# Patient Record
Sex: Male | Born: 1955 | Race: Black or African American | Hispanic: No | Marital: Married | Smoking: Former smoker
Health system: Southern US, Community
[De-identification: ages and names within clinical notes are randomized; demographics above are authoritative.]

## PROBLEM LIST (undated history)

## (undated) DIAGNOSIS — R011 Cardiac murmur, unspecified: Secondary | ICD-10-CM

## (undated) DIAGNOSIS — I209 Angina pectoris, unspecified: Secondary | ICD-10-CM

## (undated) DIAGNOSIS — I10 Essential (primary) hypertension: Secondary | ICD-10-CM

## (undated) DIAGNOSIS — L409 Psoriasis, unspecified: Secondary | ICD-10-CM

## (undated) DIAGNOSIS — I639 Cerebral infarction, unspecified: Secondary | ICD-10-CM

## (undated) HISTORY — PX: NO PAST SURGERIES: SHX2092

---

## 2013-02-01 DIAGNOSIS — I639 Cerebral infarction, unspecified: Secondary | ICD-10-CM

## 2013-02-01 HISTORY — DX: Cerebral infarction, unspecified: I63.9

## 2013-02-03 ENCOUNTER — Inpatient Hospital Stay (HOSPITAL_COMMUNITY): Payer: 59

## 2013-02-03 ENCOUNTER — Encounter (HOSPITAL_COMMUNITY): Payer: Self-pay | Admitting: *Deleted

## 2013-02-03 ENCOUNTER — Emergency Department (HOSPITAL_COMMUNITY): Payer: 59

## 2013-02-03 ENCOUNTER — Inpatient Hospital Stay (HOSPITAL_COMMUNITY)
Admission: EM | Admit: 2013-02-03 | Discharge: 2013-02-09 | DRG: 065 | Disposition: A | Discharge status: Rehabilitation Facility | Payer: 59 | Attending: Internal Medicine | Admitting: Internal Medicine

## 2013-02-03 ENCOUNTER — Other Ambulatory Visit: Payer: Self-pay

## 2013-02-03 DIAGNOSIS — I517 Cardiomegaly: Secondary | ICD-10-CM

## 2013-02-03 DIAGNOSIS — R29810 Facial weakness: Secondary | ICD-10-CM | POA: Diagnosis present

## 2013-02-03 DIAGNOSIS — I6350 Cerebral infarction due to unspecified occlusion or stenosis of unspecified cerebral artery: Secondary | ICD-10-CM

## 2013-02-03 DIAGNOSIS — L408 Other psoriasis: Secondary | ICD-10-CM | POA: Diagnosis present

## 2013-02-03 DIAGNOSIS — Z79899 Other long term (current) drug therapy: Secondary | ICD-10-CM

## 2013-02-03 DIAGNOSIS — I639 Cerebral infarction, unspecified: Secondary | ICD-10-CM

## 2013-02-03 DIAGNOSIS — G8100 Flaccid hemiplegia affecting unspecified side: Secondary | ICD-10-CM | POA: Diagnosis present

## 2013-02-03 DIAGNOSIS — F1010 Alcohol abuse, uncomplicated: Secondary | ICD-10-CM | POA: Diagnosis present

## 2013-02-03 DIAGNOSIS — R4701 Aphasia: Secondary | ICD-10-CM | POA: Diagnosis present

## 2013-02-03 DIAGNOSIS — I6340 Cerebral infarction due to embolism of unspecified cerebral artery: Secondary | ICD-10-CM

## 2013-02-03 DIAGNOSIS — I10 Essential (primary) hypertension: Secondary | ICD-10-CM | POA: Diagnosis present

## 2013-02-03 DIAGNOSIS — Z8673 Personal history of transient ischemic attack (TIA), and cerebral infarction without residual deficits: Secondary | ICD-10-CM | POA: Diagnosis present

## 2013-02-03 DIAGNOSIS — Z720 Tobacco use: Secondary | ICD-10-CM

## 2013-02-03 DIAGNOSIS — I4891 Unspecified atrial fibrillation: Secondary | ICD-10-CM | POA: Diagnosis present

## 2013-02-03 DIAGNOSIS — E785 Hyperlipidemia, unspecified: Secondary | ICD-10-CM | POA: Diagnosis present

## 2013-02-03 DIAGNOSIS — F17200 Nicotine dependence, unspecified, uncomplicated: Secondary | ICD-10-CM | POA: Diagnosis present

## 2013-02-03 HISTORY — DX: Cerebral infarction, unspecified: I63.9

## 2013-02-03 HISTORY — DX: Psoriasis, unspecified: L40.9

## 2013-02-03 HISTORY — DX: Cardiac murmur, unspecified: R01.1

## 2013-02-03 HISTORY — DX: Angina pectoris, unspecified: I20.9

## 2013-02-03 HISTORY — DX: Essential (primary) hypertension: I10

## 2013-02-03 LAB — BASIC METABOLIC PANEL
GFR calc Af Amer: 79 mL/min — ABNORMAL LOW (ref >90)
GFR calc non Af Amer: 69 mL/min — ABNORMAL LOW (ref >90)
Potassium: 4.1 mEq/L (ref 3.5–5.1)
Sodium: 139 mEq/L (ref 135–145)

## 2013-02-03 LAB — CBC WITH DIFFERENTIAL/PLATELET
Basophils Relative: 0 % (ref 0–1)
Eosinophils Absolute: 0.1 10*3/uL (ref 0.0–0.7)
Lymphs Abs: 2.1 10*3/uL (ref 0.7–4.0)
MCH: 29.1 pg (ref 26.0–34.0)
MCHC: 34.8 g/dL (ref 30.0–36.0)
Neutrophils Relative %: 64 % (ref 43–77)
Platelets: 259 10*3/uL (ref 150–400)
RBC: 5.91 MIL/uL — ABNORMAL HIGH (ref 4.22–5.81)

## 2013-02-03 MED ORDER — SENNOSIDES-DOCUSATE SODIUM 8.6-50 MG PO TABS: 1.0000 | ORAL_TABLET | Freq: Every evening | ORAL | Status: DC | PRN

## 2013-02-03 MED ORDER — ASPIRIN 325 MG PO TABS
325.0000 mg | ORAL_TABLET | Freq: Every day | ORAL | Status: DC
  Administered 2013-02-04 – 2013-02-09 (×6): 325 mg via ORAL
  Filled 2013-02-03 (×6): qty 1

## 2013-02-03 MED ORDER — THIAMINE HCL 100 MG/ML IJ SOLN
100.0000 mg | Freq: Every day | INTRAMUSCULAR | Status: DC
  Administered 2013-02-03 – 2013-02-04 (×2): 100 mg via INTRAVENOUS
  Filled 2013-02-03: qty 2
  Filled 2013-02-03: qty 1

## 2013-02-03 MED ORDER — ENOXAPARIN SODIUM 40 MG/0.4ML ~~LOC~~ SOLN
40.0000 mg | SUBCUTANEOUS | Status: DC
  Administered 2013-02-03 – 2013-02-08 (×6): 40 mg via SUBCUTANEOUS
  Filled 2013-02-03 (×7): qty 0.4

## 2013-02-03 MED ORDER — SODIUM CHLORIDE 0.9 % IV SOLN
INTRAVENOUS | Status: AC
  Administered 2013-02-03: 20 mL/h via INTRAVENOUS

## 2013-02-03 MED ORDER — PNEUMOCOCCAL VAC POLYVALENT 25 MCG/0.5ML IJ INJ
0.5000 mL | INJECTION | INTRAMUSCULAR | Status: AC
  Administered 2013-02-04: 0.5 mL via INTRAMUSCULAR
  Filled 2013-02-03: qty 0.5

## 2013-02-03 MED ORDER — ASPIRIN 325 MG PO TABS
325.0000 mg | ORAL_TABLET | Freq: Once | ORAL | Status: DC
  Administered 2013-02-03: 325 mg via ORAL
  Filled 2013-02-03: qty 1

## 2013-02-03 MED ORDER — NICOTINE 14 MG/24HR TD PT24
14.0000 mg | MEDICATED_PATCH | TRANSDERMAL | Status: DC
  Administered 2013-02-03 – 2013-02-08 (×6): 14 mg via TRANSDERMAL
  Filled 2013-02-03 (×7): qty 1

## 2013-02-03 NOTE — Progress Notes (Signed)
EEG completed.

## 2013-02-03 NOTE — H&P (Addendum)
[facility] History and Physical  Kale Rondeau EAV:[id number] DOB: 1956-06-24 DOA: 02/03/2013  Referring physician: EDP PCP: Pcp Not In System    Chief Complaint: R sided weakness  HPI: [name] is a 57 y.o. male with h/o HTN, tobacco use present to the ER today with the above complaint, he initially noticed the R arm and leg weakness on Monday night along with difficulty finding words. Per Pt he just didn't feel like coming to the ER when his symptoms started,  this morning he could not get out of bed and hence called EMS and was brought to the ER. Upon evaluation in ER, CT head revealed subacute left anterior cerebral artery infarct    Review of Systems:  The patient denies anorexia, fever, weight loss,, vision loss, decreased hearing, hoarseness, chest pain, syncope, dyspnea on exertion, peripheral edema, balance deficits, hemoptysis, abdominal pain, melena, hematochezia, severe indigestion/heartburn, hematuria, incontinence, genital sores, muscle weakness, suspicious skin lesions, transient blindness, difficulty walking, depression, unusual weight change, abnormal bleeding, enlarged lymph nodes, angioedema, and breast masses.    Past Medical History  Diagnosis Date  . Hypertension    No past surgical history on file. Social History:  reports that he has been smoking.  He does not have any smokeless tobacco history on file. He reports that  drinks alcohol. He reports that he does not use illicit drugs. Lives at home  Not on File  Family history Discussed with pt, he does not know his family history  Prior to Admission medications   Medication Sig Start Date End Date Taking? Authorizing Provider  clobetasol cream (TEMOVATE) 0.05 % Apply 1 application topically daily as needed (psoriasis).   Yes Historical Provider, MD  desonide (DESOWEN) 0.05 % cream Apply 1 application topically daily as needed (psoriasis).   Yes Historical Provider, MD  hydrochlorothiazide  (HYDRODIURIL) 25 MG tablet Take 25 mg by mouth daily.   Yes Historical Provider, MD  hydrOXYzine (ATARAX/VISTARIL) 10 MG tablet Take 10 mg by mouth daily.   Yes Historical Provider, MD   Physical Exam: Filed Vitals:   02/03/13 1250 02/03/13 1300 02/03/13 1330 02/03/13 1400  BP:  148/83 150/84 111/97  Pulse:  69 73 71  Temp: 98.4 F (36.9 C)     TempSrc:      Resp:  14 16 14   SpO2:  99% 99% 99%   Gen: AAOx3, no distress, very irritable HEENT: PERRLA, EOMI CVS: S1S2/RRR Lungs: CTAB Abd: soft, Nt, BS present, no palpable organomegaly Ext: no edema c/c Neuro:  Deviated angle of mouth Motor: RLE 0/5, RUE 3/5, LLE and LUE normal Sensory: light touch intact B/l Plantars downgoing DTR 2-3Plus b/l Coordination: finger nose intact on L and relatively on R after accounting for weakness    Labs on Admission:  Basic Metabolic Panel:  Recent Labs Lab 02/03/13 1323  NA 139  K 4.1  CL 102  CO2 22  GLUCOSE 107*  BUN 18  CREATININE 1.17  CALCIUM 9.8   Liver Function Tests: No results found for this basename: AST, ALT, ALKPHOS, BILITOT, PROT, ALBUMIN,  in the last 168 hours No results found for this basename: LIPASE, AMYLASE,  in the last 168 hours No results found for this basename: AMMONIA,  in the last 168 hours CBC:  Recent Labs Lab 02/03/13 1323  WBC 8.3  NEUTROABS 5.4  HGB 17.2*  HCT 49.4  MCV 83.6  PLT 259   Cardiac Enzymes: No results found for this basename: CKTOTAL, CKMB, CKMBINDEX, TROPONINI,  in the last 168 hours  BNP (last 3 results) No results found for this basename: PROBNP,  in the last 8760 hours CBG: No results found for this basename: GLUCAP,  in the last 168 hours  Radiological Exams on Admission: Ct Head Wo Contrast  02/03/2013   *RADIOLOGY REPORT*  Clinical Data: Acute stroke.  Occipital headache.  CT HEAD WITHOUT CONTRAST  Technique:  Contiguous axial images were obtained from the base of the skull through the vertex without contrast.   Comparison: None.  Findings: There is a subacute nonhemorrhagic left anterior cerebral artery distribution infarct with secondary edema without midline shift and only minimal mass  effect upon the anterior aspect of the interhemispheric falx.  Ventricles are not compressed.  The brain parenchyma is otherwise normal.  No osseous abnormality.  IMPRESSION: Non-hemorrhagic subacute left anterior cerebral artery infarct.   Original Report Authenticated By: Francene Boyers, M.D.    EKG: Independently reviewed. NSR, no acute ST-T wave changes  Assessment/Plan Active Problems:   CVA (cerebral infarction)   HTN (hypertension)  1. CVA-L ACA infarct -admit to Tele -NEurochecks -MRI/MRA Brain -ECHO/carotis duplex -Lipids, HBaic -PT/OT/ST eval -ASA 325mg  daily  2. HTN: -hold anti-hypertensive's to allow for permissible HTN  3. Tobacco use: Counseled  4. ETOH use -thiamine, MVI -monitor for withdrawal  DVT proph: lovenox   Code Status: Family Communication:  Disposition Plan: inpatient  Time spent:62min  [name] [facility] Pager [phone]  If 7PM-7AM, please contact night-coverage [email or web address] Password [email or web address] 02/03/2013, 4:19 PM

## 2013-02-03 NOTE — ED Notes (Signed)
Per report from [name] was at home this am he reportedly fell out of bed (denies neck or back pain).  On further evaluation [name] reports having a onset of R sided weakness onset 2 days ago noticed by his wife around 2200.  [name] has dysphagia, R sided weakness with drift and weak grip on R side, R leg weakness noted as well.

## 2013-02-03 NOTE — Consult Note (Signed)
Referring Physician: Rhunette Croft    Chief Complaint: right sided weakness and expressive aphasia  HPI:                                                                                                                                         [name] is an 57 y.o. male who noted on Monday night he had a sudden onset of right arm and leg weakness (leg >arm) along with difficulty expressing himself. He did not seek medical attention at that time. Patient was brought to ED today due to continued symptoms and now unable to get out of bed. Patient states he takes no medications but does smoke 5 cigarettes/day.   Date last known well: 5.19.14 Time last known well: 2100 tPA Given: No: out of window  Past Medical History  Diagnosis Date  . Hypertension     No past surgical history on file.  No family history on file. Social History:  reports that he has been smoking.  He does not have any smokeless tobacco history on file. He reports that  drinks alcohol. He reports that he does not use illicit drugs.  Allergies: Not on File  Medications:                                                                                                                           No current facility-administered medications for this encounter.   Current Outpatient Prescriptions  Medication Sig Dispense Refill  . clobetasol cream (TEMOVATE) 0.05 % Apply 1 application topically daily as needed (psoriasis).      Marland Kitchen desonide (DESOWEN) 0.05 % cream Apply 1 application topically daily as needed (psoriasis).      . hydrochlorothiazide (HYDRODIURIL) 25 MG tablet Take 25 mg by mouth daily.      . hydrOXYzine (ATARAX/VISTARIL) 10 MG tablet Take 10 mg by mouth daily.         ROS:  History obtained from the patient  General ROS: negative for - chills, fatigue, fever, night sweats, weight  gain or weight loss Psychological ROS: negative for - behavioral disorder, hallucinations, memory difficulties, mood swings or suicidal ideation Ophthalmic ROS: negative for - blurry vision, double vision, eye pain or loss of vision ENT ROS: negative for - epistaxis, nasal discharge, oral lesions, sore throat, tinnitus or vertigo Allergy and Immunology ROS: negative for - hives or itchy/watery eyes Hematological and Lymphatic ROS: negative for - bleeding problems, bruising or swollen lymph nodes Endocrine ROS: negative for - galactorrhea, hair pattern changes, polydipsia/polyuria or temperature intolerance Respiratory ROS: negative for - cough, hemoptysis, shortness of breath or wheezing Cardiovascular ROS: negative for - chest pain, dyspnea on exertion, edema or irregular heartbeat Gastrointestinal ROS: negative for - abdominal pain, diarrhea, hematemesis, nausea/vomiting or stool incontinence Genito-Urinary ROS: negative for - dysuria, hematuria, incontinence or urinary frequency/urgency Musculoskeletal ROS: negative for - joint swelling or muscular weakness Neurological ROS: as noted in HPI Dermatological ROS: negative for rash and skin lesion changes  Neurologic Examination:                                                                                                      Blood pressure 150/84, pulse 73, temperature 98.4 F (36.9 C), temperature source Oral, resp. rate 16, SpO2 99.00%.   Mental Status: Alert, oriented, thought content appropriate.  Speech dysarthric with evidence of expressive aphasia.  Able to follow 3 step commands without difficulty. Cranial Nerves: II: Discs flat bilaterally; Visual fields grossly normal, pupils equal, round, reactive to light and accommodation III,IV, VI: ptosis on right present, extra-ocular motions intact bilaterally V,VII: Face asymmetric on the right, facial light touch sensation normal bilaterally VIII: hearing normal bilaterally IX,X: gag  reflex present XI: bilateral shoulder shrug XII: midline tongue extension Motor: Right : Upper extremity   See note    Left:     Upper extremity   5/5  Lower extremity   0/5      Lower extremity   5/5 --unable to abduct right arm, no tricep strength, 4/5 bicep, weak grip and increased tone on right arm --right leg has increased tone and no movement  Sensory: Pinprick and light touch intact throughout, bilaterally but feel his right leg is decreased Deep Tendon Reflexes: 2+ left bicep and tricep, 3+right bicep, tricep and brachioradialis, 2+ right KJ and AJ, 1+ left KJ and no AJ and symmetric throughout Plantars: Right: downgoing   Left: downgoing Cerebellar: normal finger-to-nose on left,  normal heel-to-shin test on left CV: pulses palpable throughout    Results for orders placed during the hospital encounter of 02/03/13 (from the past 48 hour(s))  CBC WITH DIFFERENTIAL     Status: Abnormal   Collection Time    02/03/13  1:23 PM      Result Value Range   WBC 8.3  4.0 - 10.5 K/uL   RBC 5.91 (*) 4.22 - 5.81 MIL/uL   Hemoglobin 17.2 (*) 13.0 - 17.0 g/dL   HCT 40.9  81.1 - 91.4 %   MCV  83.6  78.0 - 100.0 fL   MCH 29.1  26.0 - 34.0 pg   MCHC 34.8  30.0 - 36.0 g/dL   RDW 16.1  09.6 - 04.5 %   Platelets 259  150 - 400 K/uL   Neutrophils Relative % 64  43 - 77 %   Neutro Abs 5.4  1.7 - 7.7 K/uL   Lymphocytes Relative 26  12 - 46 %   Lymphs Abs 2.1  0.7 - 4.0 K/uL   Monocytes Relative 9  3 - 12 %   Monocytes Absolute 0.7  0.1 - 1.0 K/uL   Eosinophils Relative 1  0 - 5 %   Eosinophils Absolute 0.1  0.0 - 0.7 K/uL   Basophils Relative 0  0 - 1 %   Basophils Absolute 0.0  0.0 - 0.1 K/uL  BASIC METABOLIC PANEL     Status: Abnormal   Collection Time    02/03/13  1:23 PM      Result Value Range   Sodium 139  135 - 145 mEq/L   Potassium 4.1  3.5 - 5.1 mEq/L   Chloride 102  96 - 112 mEq/L   CO2 22  19 - 32 mEq/L   Glucose, Bld 107 (*) 70 - 99 mg/dL   BUN 18  6 - 23 mg/dL    Creatinine, Ser 4.09  0.50 - 1.35 mg/dL   Calcium 9.8  8.4 - 81.1 mg/dL   GFR calc non Af Amer 69 (*) >90 mL/min   GFR calc Af Amer 79 (*) >90 mL/min   Comment:            The eGFR has been calculated     using the CKD EPI equation.     This calculation has not been     validated in all clinical     situations.     eGFR's persistently     <90 mL/min signify     possible Chronic Kidney Disease.   Ct Head Wo Contrast  02/03/2013   *RADIOLOGY REPORT*  Clinical Data: Acute stroke.  Occipital headache.  CT HEAD WITHOUT CONTRAST  Technique:  Contiguous axial images were obtained from the base of the skull through the vertex without contrast.  Comparison: None.  Findings: There is a subacute nonhemorrhagic left anterior cerebral artery distribution infarct with secondary edema without midline shift and only minimal mass  effect upon the anterior aspect of the interhemispheric falx.  Ventricles are not compressed.  The brain parenchyma is otherwise normal.  No osseous abnormality.  IMPRESSION: Non-hemorrhagic subacute left anterior cerebral artery infarct.   Original Report Authenticated By: Francene Boyers, M.D.    Assessment and plan discussed with with attending physician and they are in agreement.    Felicie Morn PA-C [facility] [phone]  02/03/2013, 2:15 PM   Assessment: 57 y.o. male with left frontal CVA and symptoms of right arm and leg weakness and expressive aphasia. Patient takes no ASA at home and is not a tPA candidate due to LSN 5.19.14.    Stroke Risk Factors - hypertension and smoking  Plan: 1. HgbA1c, fasting lipid panel 2. MRI, MRA  of the brain without contrast 3. PT consult, OT consult, Speech consult 4. Echocardiogram 5. Carotid dopplers 6. Prophylactic therapy-Antiplatelet med: Aspirin - dose 81 mg daily after passes swallow screen 7. Risk factor modification 8. Telemetry monitoring 9. Frequent neuro checks    Patient seen and examined together with  physician assistant and I concur with the assessment  and plan.  Wyatt Portela, MD

## 2013-02-03 NOTE — Progress Notes (Signed)
*  PRELIMINARY RESULTS* Vascular Ultrasound Carotid Duplex (Doppler) has been completed.   There is no obvious evidence of hemodynamically significant internal carotid artery stenosis >40%. Vertebral arteries are patent with antegrade flow.  02/03/2013 4:28 PM Gertie Fey, RDMS, RDCS

## 2013-02-03 NOTE — Progress Notes (Signed)
*  PRELIMINARY RESULTS* Echocardiogram 2D Echocardiogram has been performed.  [name] 02/03/2013, 4:11 PM

## 2013-02-03 NOTE — ED Provider Notes (Signed)
History     CSN: [id number]  Arrival date & time 02/03/13  1116   First MD Initiated Contact with Patient 02/03/13 1131      Chief Complaint  Patient presents with  . Cerebrovascular Accident    R sided weakness onset 02/01/2013 2200    (Consider location/radiation/quality/duration/timing/severity/associated sxs/prior treatment) HPI Angeles Paolucci is a 57 y.o. male current everyday smoker with a history of hypertension presents to the emergency department complaining of right-sided weakness.  Onset of symptoms began Monday and were described as right upper and right lower extremity numbness/weakness.  Patient arrived to the emergency department today because this morning he was unable to get out of bed due to the inability to use his limbs.  Wife reports noticing dysarthria/dysphonia since yesterday with facial droop.  Patient denies any head or neck trauma, history of CVA, difficulty swallowing liquids or solids, dizziness, change in vision, fevers, night sweats or chills.    Past Medical History  Diagnosis Date  . Hypertension     No past surgical history on file.  No family history on file.  History  Substance Use Topics  . Smoking status: Current Every Day Smoker -- 0.50 packs/day for 25 years  . Smokeless tobacco: Not on file  . Alcohol Use: Yes      Review of Systems Ten systems reviewed and are negative for acute change, except as noted in the HPI.    Allergies  Review of patient's allergies indicates not on file.  Home Medications   Current Outpatient Rx  Name  Route  Sig  Dispense  Refill  . clobetasol cream (TEMOVATE) 0.05 %   Topical   Apply 1 application topically daily as needed (psoriasis).         Marland Kitchen desonide (DESOWEN) 0.05 % cream   Topical   Apply 1 application topically daily as needed (psoriasis).         . hydrochlorothiazide (HYDRODIURIL) 25 MG tablet   Oral   Take 25 mg by mouth daily.         . hydrOXYzine (ATARAX/VISTARIL) 10 MG  tablet   Oral   Take 10 mg by mouth daily.           BP 150/84  Pulse 73  Temp(Src) 98.4 F (36.9 C) (Oral)  Resp 16  SpO2 99%  Physical Exam  Nursing note and vitals reviewed. Constitutional: He is oriented to person, place, and time. He appears well-developed and well-nourished. No distress.  HENT:  Head: Normocephalic and atraumatic.  Eyes: Conjunctivae and EOM are normal.  Neck: Normal range of motion.  Pulmonary/Chest: Effort normal.  Musculoskeletal: Normal range of motion.  Neurological: He is alert and oriented to person, place, and time.  Decreased sensation along the trigeminal nerve left side.  Positive facial droop and dysphasia.  Right-sided weakness  in grip strength, upper extremity, lower extremity and shoulder shrug. Eye movement, pupillary response & vision intact.    Skin: Skin is warm and dry. No rash noted. He is not diaphoretic.  Psychiatric: He has a normal mood and affect. His behavior is normal.    ED Course  Procedures (including critical care time)  Labs Reviewed  CBC WITH DIFFERENTIAL - Abnormal; Notable for the following:    RBC 5.91 (*)    Hemoglobin 17.2 (*)    All other components within normal limits  BASIC METABOLIC PANEL - Abnormal; Notable for the following:    Glucose, Bld 107 (*)    GFR calc non  Af Amer 69 (*)    GFR calc Af Amer 79 (*)    All other components within normal limits  URINALYSIS, ROUTINE W REFLEX MICROSCOPIC   Ct Head Wo Contrast  02/03/2013   *RADIOLOGY REPORT*  Clinical Data: Acute stroke.  Occipital headache.  CT HEAD WITHOUT CONTRAST  Technique:  Contiguous axial images were obtained from the base of the skull through the vertex without contrast.  Comparison: None.  Findings: There is a subacute nonhemorrhagic left anterior cerebral artery distribution infarct with secondary edema without midline shift and only minimal mass  effect upon the anterior aspect of the interhemispheric falx.  Ventricles are not  compressed.  The brain parenchyma is otherwise normal.  No osseous abnormality.  IMPRESSION: Non-hemorrhagic subacute left anterior cerebral artery infarct.   Original Report Authenticated By: Francene Boyers, M.D.   Consult neurology, to see in ED, MRI/A brain added.   No diagnosis found.    MDM  57 year old male to ER with right-sided upper and lower extremity weakness, lasts normally seen on Monday presented to the emergency department and found to have had subacute left anterior cerebral artery infarct. Pt passed swallow screen, ASA ordered. Consult to neurology placed. The patient appears reasonably stabilized for admission considering the current resources, flow, and capabilities available in the ED at this time, and I doubt any other [facility] requiring further screening and/or treatment in the ED prior to admission.         Jaci Carrel, [place] 02/03/13 1440   Medical screening examination/treatment/procedure(s) were conducted as a shared visit with non-physician practitioner(s) and myself.  I personally evaluated the patient during the encounter  Derwood Kaplan, MD 02/03/13 1646

## 2013-02-03 NOTE — ED Notes (Signed)
Patient transported to CT 

## 2013-02-04 LAB — URINALYSIS, ROUTINE W REFLEX MICROSCOPIC
Nitrite: NEGATIVE
Specific Gravity, Urine: 1.036 — ABNORMAL HIGH (ref 1.005–1.030)
Urobilinogen, UA: 0.2 mg/dL (ref 0.0–1.0)

## 2013-02-04 LAB — LIPID PANEL
LDL Cholesterol: 127 mg/dL — ABNORMAL HIGH (ref 0–99)
VLDL: 38 mg/dL (ref 0–40)

## 2013-02-04 LAB — HEMOGLOBIN A1C: Hgb A1c MFr Bld: 5.7 % — ABNORMAL HIGH (ref <5.7)

## 2013-02-04 LAB — ANTITHROMBIN III: AntiThromb III Func: 116 % (ref 75–120)

## 2013-02-04 LAB — RAPID URINE DRUG SCREEN, HOSP PERFORMED
Amphetamines: NOT DETECTED
Benzodiazepines: NOT DETECTED
Opiates: NOT DETECTED

## 2013-02-04 MED ORDER — VITAMIN B-1 100 MG PO TABS
100.0000 mg | ORAL_TABLET | Freq: Every day | ORAL | Status: DC
  Administered 2013-02-05 – 2013-02-09 (×5): 100 mg via ORAL
  Filled 2013-02-04 (×5): qty 1

## 2013-02-04 NOTE — Evaluation (Signed)
Speech Language Pathology Evaluation Patient Details Name: [name] MRN: [id number] DOB: 19-Aug-1956 Today's Date: 02/04/2013 Time: 4782-9562 SLP Time Calculation (min): 25 min  Problem List:  Patient Active Problem List   Diagnosis Date Noted  . CVA (cerebral infarction) 02/03/2013  . HTN (hypertension) 02/03/2013  . Tobacco abuse 02/03/2013   Past Medical History:  Past Medical History  Diagnosis Date  . Hypertension   . Anginal pain   . Heart murmur   . Stroke 02/01/2013    left frontal CVA and symptoms of right arm and leg weakness and expressive aphasia/notes 02/03/2013 (02/03/2013)  . Psoriasis     "back and stomach" (02/03/2013)   Past Surgical History:  Past Surgical History  Procedure Laterality Date  . No past surgeries     HPI:  57 yo male adm to [facility] with right sided arm/leg weakness and word finding deficits initiating Mon night.  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  Order for speech/language evaluation received.    Assessment / Plan / Recommendation Clinical Impression  Pt presents with moderate expressive more than receptive language difficulties resulting in pt responding in short phrase level to SLP questions.    Pt able to state his address, family situation but appears with some word finding difficulties evidenced by pauses during expression.  Responsive naming tasks completed 100%.    Two step directions followed 50% accurately, ? some limb apraxia.  Flat affect apparent but uncertain of pt's baseline.  Mild dysarthria noted with imprecise articulation impacting speech intelligibility.  Pt also admits to memory changes with this event and he did not recall medical diagnosis.    Skilled SLP intervention recommended to maximize pt's cognitive linguistic function as he states he manages all cooking, cleaning, bill paying at home as his wife works full time.  Skilled SLP intervention included educating pt to results of testing, setting goals, and  providing verbal compensation to pt.  Pt agreeable to SLP intervention.      SLP Assessment  Patient needs continued Speech Lanaguage Pathology Services    Follow Up Recommendations   (TBD)    Frequency and Duration min 2x/week  2 weeks   Pertinent Vitals/Pain n/a   SLP Goals  SLP Goals Potential to Achieve Goals: Fair Potential Considerations: Cooperation/participation level (uncertain of prior level of function) SLP Goal #1: Pt will demonstrate ability to manage financial and home management tasks with moderate cues and 80% accuracy.   SLP Goal #2: Pt willl use strategies to improve speech intelligibility to 80% at sentence level with moderate verbal cues.  SLP Goal #3: Pt will use word finding strategies during communication with moderate verbal cues and 80% accuracy.  SLP Goal #4: Pt will use memory strategies to compensates for deficits with moderate cues and 80% accuracy.   SLP Evaluation Prior Functioning  Type of Home: House Lives With: Spouse;Son Available Help at Discharge: Family (son does not work per pt and can assist) Education: HS graduate Vocation:  (pt does not work per his statement)   Cognition  Overall Cognitive Status: No family/caregiver present to determine baseline cognitive functioning (pt states WFL) Arousal/Alertness: Awake/alert Orientation Level: Oriented X4 Attention: Sustained Sustained Attention: Impaired Sustained Attention Impairment: Functional basic Memory: Impaired Memory Impairment:  (pt admits to memory changes, did not recall testing results) Problem Solving: Impaired Problem Solving Impairment: Functional basic    Comprehension  Auditory Comprehension Overall Auditory Comprehension: Impaired Commands: Impaired One Step Basic Commands: 75-100% accurate Two Step Basic Commands:  50-74% accurate Conversation: Complex Interfering Components: Attention Reading Comprehension Reading Status: Not tested    Expression  Expression Primary Mode of Expression: Verbal Verbal Expression Overall Verbal Expression: Impaired Initiation: No impairment Automatic Speech: Social Response Level of Generative/Spontaneous Verbalization: Phrase (answers questions in short phrases only, does not elaborate) Repetition: No impairment Naming: No impairment (for pictures of 7 items) Pragmatics: Impairment Impairments: Abnormal affect;Monotone Other Verbal Expression Comments: pt with difficulty pointing out and verbalizing significant elements in picture, states he wears glasses that are not present, expressive language limited to short phrases Written Expression Dominant Hand: Right Written Expression: Not tested   Oral / Motor Oral Motor/Sensory Function Overall Oral Motor/Sensory Function: Impaired Labial ROM: Reduced right Labial Strength: Reduced Labial Sensation: Reduced Lingual ROM: Within Functional Limits Lingual Symmetry: Within Functional Limits Lingual Strength: Reduced Lingual Sensation: Within Functional Limits Facial ROM: Reduced right Facial Strength: Reduced Facial Sensation: Reduced Velum: Within Functional Limits (delayed response) Motor Speech Phonation: Low vocal intensity (uncertain of baseline) Articulation: Impaired Level of Impairment: Phrase Intelligibility: Intelligibility reduced Phrase: 75-100% accurate Motor Planning: Witnin functional limits Motor Speech Errors: Not applicable Effective Techniques: Slow rate   GO     Donavan Burnet, MS [facility] SLP [phone]

## 2013-02-04 NOTE — Evaluation (Signed)
Occupational Therapy Evaluation Patient Details Name: Wendelin Bradt MRN: [id number] DOB: [birth year] Today's Date: 02/04/2013 Time: 4098-1191 OT Time Calculation (min): 29 min  OT Assessment / Plan / Recommendation Clinical Impression   This 57 y.o. Male admitted with Rt sided weakness.  MRI showed large left ACA infarct.  Pt presents to OT with Rt. 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  He will benefit from OT to maximize safety and independence  with BADLs to allow him to return to min A level.  Recommend CIR consult and 24 hour supervision/assist at discharge.     OT Assessment  Patient needs continued OT Services    Follow Up Recommendations  CIR;Supervision/Assistance - 24 hour    Barriers to Discharge Decreased caregiver support    Equipment Recommendations  None recommended by OT    Recommendations for Other Services Rehab consult  Frequency  Min 2X/week    Precautions / Restrictions Precautions Precautions: Fall Precaution Comments: Pt impulsive with poor awareness of deficits; Rt. neglect Restrictions Weight Bearing Restrictions: No   Pertinent Vitals/Pain     ADL  Eating/Feeding: Supervision/safety Where Assessed - Eating/Feeding: Bed level Grooming: Brushing hair;Supervision/safety Where Assessed - Grooming: Supine, head of bed up Upper Body Bathing: Moderate assistance Where Assessed - Upper Body Bathing: Supine, head of bed up;Supported sitting Lower Body Bathing: +2 Total assistance Lower Body Bathing: Patient Percentage: 40% Where Assessed - Lower Body Bathing: Supported sit to stand Upper Body Dressing: Maximal assistance Where Assessed - Upper Body Dressing: Supported sitting Lower Body Dressing: +2 Total assistance Lower Body Dressing: Patient Percentage: 30% Where Assessed - Lower Body Dressing: Supported sit to Pharmacist, hospital: +2 Total assistance Toilet Transfer:  Patient Percentage: 40% Toilet Transfer Method: Sit to stand;Stand pivot Acupuncturist: Comfort height toilet;Grab bars Toileting - Clothing Manipulation and Hygiene: +2 Total assistance Toileting - Clothing Manipulation and Hygiene: Patient Percentage: 40% Where Assessed - Toileting Clothing Manipulation and Hygiene: Standing Transfers/Ambulation Related to ADLs: Pt ambulated to bathroom with total A +2 (pt~40%).  Pt leaning heavily to Rt. side and difficulty advancing Rt. LE    OT Diagnosis: Generalized weakness;Cognitive deficits;Hemiplegia dominant side;Apraxia  OT Problem List: Decreased strength;Decreased range of motion;Decreased activity tolerance;Impaired balance (sitting and/or standing);Decreased coordination;Decreased cognition;Decreased safety awareness;Decreased knowledge of use of DME or AE;Impaired UE functional use;Impaired vision/perception OT Treatment Interventions: Self-care/ADL training;Neuromuscular education;DME and/or AE instruction;Therapeutic activities;Cognitive remediation/compensation;Visual/perceptual remediation/compensation;Patient/family education;Balance training   OT Goals Acute Rehab OT Goals OT Goal Formulation: With patient Time For Goal Achievement: 02/18/13 Potential to Achieve Goals: Good ADL Goals Pt Will Perform Grooming: with supervision;Sitting, chair ADL Goal: Grooming - Progress: Goal set today Pt Will Perform Upper Body Bathing: with min assist;Sitting, chair ADL Goal: Upper Body Bathing - Progress: Goal set today Pt Will Perform Lower Body Bathing: with mod assist;Sit to stand from chair;Sit to stand from bed ADL Goal: Lower Body Bathing - Progress: Goal set today Pt Will Perform Upper Body Dressing: with min assist;Sitting, chair ADL Goal: Upper Body Dressing - Progress: Goal set today Pt Will Perform Lower Body Dressing: with min assist;Sit to stand from chair;Sit to stand from bed ADL Goal: Lower Body Dressing - Progress:  Goal set today Pt Will Perform Toileting - Clothing Manipulation: with min assist;Standing ADL Goal: Toileting - Clothing Manipulation - Progress: Goal set today Additional ADL Goal #1: Pt will demonstrate intellectual awareness of deficits with min cues ADL Goal: Additional Goal #1 - Progress: Goal set today Additional  ADL Goal #2: Pt will require no more than min cues for safety awareness ADL Goal: Additional Goal #2 - Progress: Goal set today  Visit Information  Last OT Received On: 02/04/13 Assistance Needed: +2 PT/OT Co-Evaluation/Treatment: Yes    Subjective Data  Subjective: "It is my Right hand!"  Pt's response when he attempted to write name with pen in mouth and was instructed to place pen in Rt. hand. Patient Stated Goal: To walk out of here   Prior Functioning     Home Living Lives With: Spouse Available Help at Discharge: Family Type of Home: Mobile home Home Access: Stairs to enter Entrance Stairs-Number of Steps: 0 at front and 3 at back ?? Home Layout: One level Bathroom Shower/Tub: Engineer, manufacturing systems: Standard Home Adaptive Equipment: None Additional Comments: Pt did not mention living with son during OT/PT eval.  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  Pt unable to state if he stopped working due to disability or for other reason Communication Communication: No difficulties Dominant Hand: Right         Vision/Perception Vision - History Baseline Vision: No visual deficits Patient Visual Report: No change from baseline Vision - Assessment Eye Alignment: Within Functional Limits Vision Assessment: Vision tested Ocular Range of Motion: Within Functional Limits Tracking/Visual Pursuits: Able to track stimulus in  all quads without difficulty Visual Fields: No apparent deficits Additional Comments: Pt with decreased visual attention Perception Perception: Impaired Inattention/Neglect: Does not attend to right side of body Praxis Praxis: Impaired Praxis Impairment Details: Ideation (questionable) Praxis-Other Comments: Pt able to demonstrate use of brush correctly, and automatically brushed Rt. side of head with his Rt hand.  Pt was insistent that Rt UE was normal with no deficit so therapist asked pt to write name with his right hand.  Pt. proceeded to place pen in mouth and proceeded to atttempt to write with his mouth.  When cued to use Rt. hand, pt became irritable and insisted "it's in my rt. hand!" when indeed the pen was in his mouth.   Cognition  Cognition Arousal/Alertness: Awake/alert Behavior During Therapy: Flat affect Overall Cognitive Status: Impaired/Different from baseline Area of Impairment: Safety/judgement;Awareness Safety/Judgement: Decreased awareness of safety;Decreased awareness of deficits Awareness: Intellectual (with mod cues) General Comments: Pt is impulsive.  Initially he states he is in the hospital because he can't urinate.  Then he acknowleges he had a stroke and couldn't walk at home, but insists he is able to walk now and he is fine with no weakness.  Pt. unable to move Rt LE to command, is able to activate Rt. LE when ambulating.  Pt. will move Rt. UE to command, but not fully.  When pt returned to bed, he acknowledges Rt. LE weakness, but insists Rt. UE is normal.  Pt attempting to move sit to stand, pull up pants after toileting, etc while leanind heavily to Rt (requiring max A) with no awareness that he is leaning, or needing assistance.     Extremity/Trunk Assessment Right Upper Extremity Assessment RUE ROM/Strength/Tone: Deficits RUE ROM/Strength/Tone Deficits: Pt. able to extend digits, touch mouth, and reach to ~60* with Rt. UE.  Neglect present, so difficult to  accurately asses Rt. UE movement RUE Sensation: WFL - Light Touch RUE Coordination: Deficits RUE Coordination Deficits: neglect/weakness  Left Upper Extremity Assessment LUE ROM/Strength/Tone: Within functional levels LUE Sensation: WFL - Light Touch LUE Coordination: WFL - gross/fine motor Trunk Assessment Trunk Assessment: Other exceptions Trunk Exceptions: Pt leaning heavily to Rt. in standing     Mobility Bed Mobility Bed Mobility: Supine to Sit;Sitting - Scoot to Edge of Bed;Sit to Supine Supine to Sit: 4: Min assist;With rails;HOB elevated Sitting - Scoot to Edge of Bed: 4: Min assist;With rail Sit to Supine: 3: Mod assist;With rail Details for Bed Mobility Assistance: Pt heavily dependent on rail.  Pt muscles his way to EOB using Lt. side.  Requires assist to lift Rt. LE onto bed Transfers Transfers: Sit to Stand;Stand to Sit Sit to Stand: 1: +2 Total assist;With upper extremity assist;From bed;From toilet Sit to Stand: Patient Percentage: 40% Stand to Sit: 1: +2 Total assist;With upper extremity assist;To bed;To toilet Stand to Sit: Patient Percentage: 40% Details for Transfer Assistance: Pt leaning to Rt. At times, Rt. LE under him and able to maintain weight, and other times Rt. LE adducted with pt bearing no weight through it and no awareness     Exercise     Balance Balance Balance Assessed: Yes Static Sitting Balance Static Sitting - Balance Support: Feet supported;Left upper extremity supported Static Sitting - Level of Assistance: 4: Min assist Static Sitting - Comment/# of Minutes: 3-5 mins  Dynamic Standing Balance Dynamic Standing - Balance Support: Left upper extremity supported Dynamic Standing - Level of Assistance: 1: +2 Total assist (pt ~40%) Dynamic Standing - Balance Activities: Other (comment) (pulling up pants) Dynamic Standing - Comments: Pt leaning heavily to Rt.    End of Session OT - End of Session Activity Tolerance: Patient tolerated  treatment well Patient left: in bed;with call bell/phone within reach;with bed alarm set Nurse Communication: Mobility status  GO     Jeani Hawking M 02/04/2013, 2:33 PM

## 2013-02-04 NOTE — Progress Notes (Signed)
Rehab Admissions Coordinator Note:  Patient was screened by Brock Ra for appropriateness for an Inpatient Acute Rehab Consult.  At this time, we are recommending Inpatient Rehab consult.  Brock Ra 02/04/2013, 2:49 PM  I can be reached at [phone].

## 2013-02-04 NOTE — Evaluation (Signed)
Physical Therapy Evaluation Patient Details Name: [name] MRN: [id number] DOB: [birth year] Today's Date: 02/04/2013 Time: 4132-4401 PT Time Calculation (min): 29 min  PT Assessment / Plan / Recommendation Clinical Impression  This 57 y.o. Male admitted with Rt sided weakness.  MRI showed large left ACA infarct.  Pt presents to PT with Rt. 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  He will benefit from PT to maximize safety and independence .  Recommend CIR consult and 24 hour supervision/assist at discharge.     PT Assessment  Patient needs continued PT services    Follow Up Recommendations  CIR    Does the patient have the potential to tolerate intense rehabilitation      Barriers to Discharge        Equipment Recommendations  Other (comment) (TBA post acute)    Recommendations for Other Services Rehab consult   Frequency Min 4X/week    Precautions / Restrictions Precautions Precautions: Fall Precaution Comments: Pt impulsive with poor awareness of deficits; Rt. neglect Restrictions Weight Bearing Restrictions: No   Pertinent Vitals/Pain       Mobility  Bed Mobility Bed Mobility: Supine to Sit;Sitting - Scoot to Edge of Bed;Sit to Supine Supine to Sit: 4: Min assist;With rails;HOB elevated Sitting - Scoot to Edge of Bed: 4: Min assist;With rail Sit to Supine: 3: Mod assist;With rail;Other (comment) (mostly at R Le) Details for Bed Mobility Assistance: Pt heavily dependent on rail.  Pt muscles his way to EOB using Lt. side.  Requires assist to lift Rt. LE onto bed Transfers Transfers: Sit to Stand;Stand to Sit Sit to Stand: 1: +2 Total assist;With upper extremity assist;From bed;From toilet Sit to Stand: Patient Percentage: 40% Stand to Sit: 1: +2 Total assist;With upper extremity assist;To bed;To toilet Stand to Sit: Patient Percentage: 40% Details for Transfer Assistance: Pt leaning to Rt.  At times, Rt. LE under him and able to maintain weight, and other times Rt. LE adducted with pt bearing no weight through it and no awareness Ambulation/Gait Ambulation/Gait Assistance: 1: +2 Total assist Ambulation/Gait: Patient Percentage: 40% Ambulation Distance (Feet): 15 Feet (times 2) Assistive device: 2 person hand held assist Ambulation/Gait Assistance Details: heavy list to R with little ability to w/shift L, making it difficult to advance R LE and uncoordinated/ataxic at best. Gait Pattern: Step-to pattern;Decreased step length - left;Decreased step length - right;Decreased stance time - right;Decreased stride length;Ataxic;Narrow base of support Stairs: No Modified Rankin (Stroke Patients Only) Modified Rankin: Moderately severe disability    Exercises     PT Diagnosis: Difficulty walking;Hemiplegia dominant side  PT Problem List: Decreased strength;Decreased activity tolerance;Decreased balance;Decreased mobility;Decreased cognition;Decreased knowledge of precautions;Decreased safety awareness PT Treatment Interventions: DME instruction;Gait training;Functional mobility training;Therapeutic activities;Balance training;Neuromuscular re-education;Patient/family education   PT Goals Acute Rehab PT Goals PT Goal Formulation: With patient Time For Goal Achievement: 02/18/13 Potential to Achieve Goals: Fair Pt will Roll Supine to Right Side: with supervision (no rail) PT Goal: Rolling Supine to Right Side - Progress: Goal set today Pt will Roll Supine to Left Side: with supervision PT Goal: Rolling Supine to Left Side - Progress: Goal set today Pt will go Supine/Side to Sit: with supervision;with HOB 0 degrees PT Goal: Supine/Side to Sit - Progress: Goal set today Pt will go Sit to Stand: with min assist PT Goal: Sit to Stand - Progress: Goal set today Pt will Ambulate: 16 - 50 feet;with least restrictive assistive device;with mod assist PT Goal: Ambulate -  Progress: Goal set  today Additional Goals Additional Goal #1: pregait activity at EOB with mod assist PT Goal: Additional Goal #1 - Progress: Goal set today  Visit Information  Last PT Received On: 02/04/13 Assistance Needed: +2    Subjective Data  Subjective: I'm using my R hand..... Patient Stated Goal: I want to be able to walk   Prior Functioning  Home Living Lives With: Spouse Available Help at Discharge: Family Type of Home: Mobile home Home Access: Stairs to enter Entrance Stairs-Number of Steps: 0 at front and 3 at back ?? Home Layout: One level Bathroom Shower/Tub: Engineer, manufacturing systems: Standard Home Adaptive Equipment: None Additional Comments: Pt did not mention living with son during OT/PT eval.  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 Pt unable to state if he stopped working due to disability or for other reason Communication Communication: No difficulties Dominant Hand: Right    Cognition  Cognition Arousal/Alertness: Awake/alert Behavior During Therapy: Flat affect Overall Cognitive Status: Impaired/Different from baseline Area of Impairment: Safety/judgement;Awareness Safety/Judgement: Decreased awareness of safety;Decreased awareness of deficits Awareness: Intellectual General Comments: Pt is impulsive. Initially he states he is in the hospital because he can't urinate. Then he acknowleges he had a stroke and couldn't walk at home, but insists he is able to walk now and he is fine with no weakness. Pt. unable to move Rt LE to command, is able to activate Rt. LE when ambulating. Pt. will move Rt. UE to command, but not fully. When pt returned to bed, he acknowledges Rt. LE weakness, but insists Rt. UE is normal. Pt attempting to move sit to  stand, pull up pants after toileting, etc while leanind heavily to Rt (requiring max A) with no awareness that he is leaning, or needing assistance.     Extremity/Trunk Assessment Right Upper Extremity Assessment RUE ROM/Strength/Tone: Deficits RUE ROM/Strength/Tone Deficits: Pt. able to extend digits, touch mouth, and reach to ~60* with Rt. UE.  Neglect present, so difficult to accurately asses Rt. UE movement RUE Sensation: WFL - Light Touch RUE Coordination: Deficits RUE Coordination Deficits: neglect/weakness Left Upper Extremity Assessment LUE ROM/Strength/Tone: Within functional levels LUE Sensation: WFL - Light Touch LUE Coordination: WFL - gross/fine motor Right Lower Extremity Assessment RLE ROM/Strength/Tone: Deficits RLE ROM/Strength/Tone Deficits: associated movement only during MMT, otherwise no movement to command, but can grossly advance leg during gait RLE Sensation: Deficits RLE Sensation Deficits: someinconsistencies, but generally good ability to sense LT RLE Coordination: Deficits RLE Coordination Deficits: poor gross coordination Left Lower Extremity Assessment LLE ROM/Strength/Tone: Within functional levels LLE Sensation: WFL - Light Touch LLE Coordination: WFL - gross/fine motor Trunk Assessment Trunk Assessment: Other exceptions Trunk Exceptions: Pt leaning heavily to Rt. in standing   Balance Balance Balance Assessed: Yes Static Sitting Balance Static Sitting - Balance Support: Feet supported;Left upper extremity supported Static Sitting - Level of Assistance: 4: Min assist Static Sitting - Comment/# of Minutes: 3+ min on the toilet or bed; tendency to list heavily to R and not make compensations until almost too late. Dynamic Standing Balance Dynamic Standing - Balance Support: Left upper extremity supported Dynamic Standing - Level of Assistance: 1: +2 Total assist Dynamic Standing - Balance Activities: Other (comment) (pulling up pants) Dynamic Standing  - Comments: Heavy lean R with pulling  up pants after going to the bathroom.  End of Session PT - End of Session Activity Tolerance: Patient tolerated treatment well Patient left: in bed;with bed alarm set Nurse Communication: Mobility status  GP     Roniqua Kintz, Eliseo Gum 02/04/2013, 4:58 PM 02/04/2013  Astor Bing, PT [phone]  (pager)

## 2013-02-04 NOTE — Progress Notes (Signed)
UR complete.  Jadene Stemmer RN, MSN 

## 2013-02-04 NOTE — Progress Notes (Signed)
[facility] PROGRESS NOTE  [name] ZOX:[id number] DOB: [birth year] DOA: 02/03/2013 PCP: Benita Stabile, MD  Assessment/Plan: Acute left ACA CVA -2-D echo/carotid Doppler is within normal limits. -On aspirin for secondary stroke prevention. -Await PT/OT evaluations 4 disposition planning, however I believe he will need rehabilitation given his right-sided flaccid paralysis. -Smoking cessation will be important in this gentleman.  Hypertension -Allow permissive hypertension given CVA.  Tobacco abuse -Cessation encouraged.  Alcohol abuse -Thiamine, multivitamin. -Monitor for withdrawals.  DVT prophylaxis -Lovenox.  Code Status: Full code Family Communication: Patient only  Disposition Plan: Pending PT/OT evaluations   Consultants:  Neurology, Dr. Leroy Kennedy   Antibiotics:  None   Subjective: No complaints  Objective: Filed Vitals:   02/04/13 0021 02/04/13 0219 02/04/13 0428 02/04/13 0604  BP: 147/93 151/99 150/92 151/98  Pulse: 88 94 88 78  Temp: 98.1 F (36.7 C) 98 F (36.7 C) 98.1 F (36.7 C) 98.2 F (36.8 C)  TempSrc: Oral Oral Oral Oral  Resp: 18 20 20 20   Height:      Weight:      SpO2: 95% 100% 100% 99%   No intake or output data in the 24 hours ending 02/04/13 1039 Filed Weights   02/03/13 1805  Weight: 83.915 kg (185 lb)    Exam:   General:  Alert, awake, oriented x3, in no distress  Cardiovascular: Regular rate and rhythm, no murmurs, rubs or gallops  Respiratory: Clear to auscultation bilaterally  Abdomen: Soft, nontender, nondistended, positive bowel sounds, no masses or organomegaly noted  Extremities: No clubbing, cyanosis or edema, positive pedal pulses.   Neurologic:  Right-sided flaccid paralysis  Data Reviewed: Basic Metabolic Panel:  Recent Labs Lab 02/03/13 1323  NA 139  K 4.1  CL 102  CO2 22  GLUCOSE 107*  BUN 18  CREATININE 1.17  CALCIUM 9.8   Liver Function Tests: No results found for this  basename: AST, ALT, ALKPHOS, BILITOT, PROT, ALBUMIN,  in the last 168 hours No results found for this basename: LIPASE, AMYLASE,  in the last 168 hours No results found for this basename: AMMONIA,  in the last 168 hours CBC:  Recent Labs Lab 02/03/13 1323  WBC 8.3  NEUTROABS 5.4  HGB 17.2*  HCT 49.4  MCV 83.6  PLT 259   Cardiac Enzymes: No results found for this basename: CKTOTAL, CKMB, CKMBINDEX, TROPONINI,  in the last 168 hours BNP (last 3 results) No results found for this basename: PROBNP,  in the last 8760 hours CBG: No results found for this basename: GLUCAP,  in the last 168 hours  No results found for this or any previous visit (from the past 240 hour(s)).   Studies: Dg Chest 2 View  02/03/2013   *RADIOLOGY REPORT*  Clinical Data: Stroke  CHEST - 2 VIEW  Comparison: None.  Findings: The lungs are clear.  Negative for pneumonia or heart failure.  No mass lesion is identified.  Heart size is within normal limits.  IMPRESSION: No acute abnormality.   Original Report Authenticated By: Janeece Riggers, M.D.   Ct Head Wo Contrast  02/03/2013   *RADIOLOGY REPORT*  Clinical Data: Acute stroke.  Occipital headache.  CT HEAD WITHOUT CONTRAST  Technique:  Contiguous axial images were obtained from the base of the skull through the vertex without contrast.  Comparison: None.  Findings: There is a subacute nonhemorrhagic left anterior cerebral artery distribution infarct with secondary edema without midline shift and only minimal mass  effect upon the anterior aspect of the  interhemispheric falx.  Ventricles are not compressed.  The brain parenchyma is otherwise normal.  No osseous abnormality.  IMPRESSION: Non-hemorrhagic subacute left anterior cerebral artery infarct.   Original Report Authenticated By: Francene Boyers, M.D.   Mr [facility] Wo Contrast  02/03/2013   *RADIOLOGY REPORT*  Clinical Data:  Right-sided weakness beginning 48 hours ago.  MRI HEAD WITHOUT CONTRAST MRA HEAD WITHOUT  CONTRAST  Technique:  Multiplanar, multiecho pulse sequences of the brain and surrounding structures were obtained without intravenous contrast. Angiographic images of the head were obtained using MRA technique without contrast.  Comparison:  Head CT same day  MRI HEAD  Findings:  There is minimal chronic small vessel change within the pons.  No cerebellar abnormality.  There is acute infarction throughout the majority of the left anterior cerebral artery territory including the corpus callosum to the left of midline.  No other areas of acute infarction.  There are extensive chronic small vessel ischemic changes throughout the hemispheric white matter bilaterally.  There is an old lacunar infarction in the right basal ganglia.  No sign of acute hemorrhage, hydrocephalus, mass lesion or extra-axial collection.  The area of infarction shows mild swelling.  No pituitary mass.  There is a small amount of fluid in the left division of the sphenoid sinus.  The other sinuses are clear.  IMPRESSION: Acute infarction throughout the left anterior cerebral artery territory.  Mild swelling but no evidence of hemorrhage or mass effect/shift.  Extensive chronic small vessel ischemic changes elsewhere throughout the cerebral hemispheric white matter bilaterally.  MRA HEAD  Findings: Both internal carotid arteries are patent through the siphon regions without apparent stenosis.  On the right, the internal carotid artery supplies only the middle cerebral artery territory.  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  No MCA stenosis in the proximal or major branches.  More distal branch vessels do show atherosclerotic irregularity.  The left internal carotid artery supplies the left middle cerebral artery territory and both anterior cerebral artery territories. There is abrupt occlusion of the left anterior cerebral artery 8 mm beyond its origin.  There is mild distal vessel atherosclerotic narrowing and irregularity diffusely.  Both  vertebral arteries are patent to the basilar with the right being dominant.  There is stenosis at the left vertebral basilar junction.  No basilar stenosis.  Posterior circulation branch vessels are patent but do show distal vessel atherosclerotic irregularity.  IMPRESSION: Abrupt occlusion of the left anterior cerebral artery 8 mm beyond its origin.  Both anterior cerebral arteries receive there supply from the left carotid circulation, the A1 segment on the right being likely aplastic.  Distal vessel atherosclerotic narrowing and irregularity diffusely.  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 The right vertebral artery is the dominant vessel.   Original Report Authenticated By: Paulina Fusi, M.D.   Mr Brain Wo Contrast  02/03/2013   *RADIOLOGY REPORT*  Clinical Data:  Right-sided weakness beginning 48 hours ago.  MRI HEAD WITHOUT CONTRAST MRA HEAD WITHOUT CONTRAST  Technique:  Multiplanar, multiecho pulse sequences of the brain and surrounding structures were obtained without intravenous contrast. Angiographic images of the head were obtained using MRA technique without contrast.  Comparison:  Head CT same day  MRI HEAD  Findings:  There is minimal chronic small vessel change within the pons.  No cerebellar abnormality.  There is acute infarction throughout the majority of the left anterior cerebral artery territory including the corpus callosum to the left of midline.  No  other areas of acute infarction.  There are extensive chronic small vessel ischemic changes throughout the hemispheric white matter bilaterally.  There is an old lacunar infarction in the right basal ganglia.  No sign of acute hemorrhage, hydrocephalus, mass lesion or extra-axial collection.  The area of infarction shows mild swelling.  No pituitary mass.  There is a small amount of fluid in the left division of the sphenoid sinus.  The other sinuses are clear.  IMPRESSION: Acute infarction throughout the left  anterior cerebral artery territory.  Mild swelling but no evidence of hemorrhage or mass effect/shift.  Extensive chronic small vessel ischemic changes elsewhere throughout the cerebral hemispheric white matter bilaterally.  MRA HEAD  Findings: Both internal carotid arteries are patent through the siphon regions without apparent stenosis.  On the right, the internal carotid artery supplies only the middle cerebral artery territory.  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  No MCA stenosis in the proximal or major branches.  More distal branch vessels do show atherosclerotic irregularity.  The left internal carotid artery supplies the left middle cerebral artery territory and both anterior cerebral artery territories. There is abrupt occlusion of the left anterior cerebral artery 8 mm beyond its origin.  There is mild distal vessel atherosclerotic narrowing and irregularity diffusely.  Both vertebral arteries are patent to the basilar with the right being dominant.  There is stenosis at the left vertebral basilar junction.  No basilar stenosis.  Posterior circulation branch vessels are patent but do show distal vessel atherosclerotic irregularity.  IMPRESSION: Abrupt occlusion of the left anterior cerebral artery 8 mm beyond its origin.  Both anterior cerebral arteries receive there supply from the left carotid circulation, the A1 segment on the right being likely aplastic.  Distal vessel atherosclerotic narrowing and irregularity diffusely.  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 The right vertebral artery is the dominant vessel.   Original Report Authenticated By: Paulina Fusi, M.D.    Scheduled Meds: . aspirin  325 mg Oral Daily  . enoxaparin (LOVENOX) injection  40 mg Subcutaneous Q24H  . nicotine  14 mg Transdermal Q24H  . thiamine  100 mg Intravenous Daily   Continuous Infusions:   Active Problems:   CVA (cerebral infarction)   HTN (hypertension)   Tobacco abuse    Time  spent: 35 minutes    HERNANDEZ ACOSTA,Holley Kocurek  [facility] Pager [phone]  If 7PM-7AM, please contact night-coverage at [email or web address], password [facility] 02/04/2013, 10:39 AM  LOS: 1 day

## 2013-02-04 NOTE — Progress Notes (Signed)
Nutrition Brief Note  Patient identified on the Malnutrition Screening Tool (MST) Report  Pt seemed a little confused. Pt with some expressive aphasia. Per pt he has lost about 15 lb during his stay here (1day). No family present. Per RN pt ate well today for Breakfast. Nutrition-Focused Physical Exam completed and was WNL.   Body mass index is 27.31 kg/(m^2). Patient meets criteria for Overweight based on current BMI.   Current diet order is Heart Healthy, patient is consuming approximately >75% of meals at this time. Labs and medications reviewed.   No nutrition interventions warranted at this time. If nutrition issues arise, please consult RD.   Kendell Bane RD, LDN, CNSC [phone] Pager [phone] After Hours Pager

## 2013-02-04 NOTE — Progress Notes (Signed)
Patient transferred to room 4N15. Wife notified of room change.

## 2013-02-04 NOTE — Progress Notes (Signed)
Walked into room and found pt attempting to get out of bed with family in room and tele box had been pulled off so this RN helped assist pt back to bed safely. Pt has severe R side neglect and R side weakness and needs at least 2 assist to stand up. Pt insists he is able to walk by himself however this is untrue. Assisted pt to wheel chair with 2nd staff member and helped him to bathroom. Reinforced with family that pt must call for bathroom help or assistance. Pt very impulsive and irritable and not calling for help and refusing help from staff. Requested pt be moved to camera room when one becomes available. Will pass this info on to night RN Ellie.

## 2013-02-04 NOTE — Procedures (Signed)
EEG report.  Brief clinical history: 57 years old male admitted to [facility] with new onset dysphasia and right sided weakness. No prior history of frank epileptic seizures.  Technique: this is a 17 channel routine scalp EEG performed at the bedside with bipolar and monopolar montages arranged in accordance to the international 10/20 system of electrode placement. One channel was dedicated to EKG recording.  The study was performed during wakefulness and drowsiness. Hyperventilation and intermittent photic stimulation were utilized as activating procedures.  Description:In the wakeful state, the best background consisted of a medium amplitude, posterior dominant, well sustained, symmetric and reactive 9 Hz rhythm. Drowsiness demonstrated dropout of the alpha rhythm. Frequent, intermittent, sharply contoured, at times quasi rhythmic left temporo-parietal slowing was noted. This slowing doesn't evolve at any time and thus remains confined to the left hemisphere, mainly the left temporal region. No epileptiform discharges noted.     Hyperventilation induced mild, diffuse, physiologic slowing but no epileptiform discharges. Intermittent photic stimulation did induce a normal driving response.  EKG showed sinus rhythm.  Impression: this is an abnormal awake and drowsy EEG with findings consistent with focal neurological dysfunction involving the left cerebral hemisphere, notably the left temporo-parietal region. Please, be aware that the absence of epileptiform discharges does not exclude the possibility of epilepsy.  Clinical correlation is advised.  Wyatt Portela, MD

## 2013-02-04 NOTE — Progress Notes (Signed)
Stroke Team Progress Note  HISTORY [name] is an 57 y.o. male who noted on Monday night he had a sudden onset of right arm and leg weakness (leg >arm) along with difficulty expressing himself. He did not seek medical attention at that time. Patient was brought to ED today due to continued symptoms and now unable to get out of bed. Patient states he takes no medications but does smoke 5 cigarettes/day.   Date last known well: 5.19.14  Time last known well: 2100  tPA Given: No: out of window  SUBJECTIVE  Lying in bed.Mild  aphasia  And right hemiparesis continues.    OBJECTIVE Most recent Vital Signs: Filed Vitals:   02/04/13 0021 02/04/13 0219 02/04/13 0428 02/04/13 0604  BP: 147/93 151/99 150/92 151/98  Pulse: 88 94 88 78  Temp: 98.1 F (36.7 C) 98 F (36.7 C) 98.1 F (36.7 C) 98.2 F (36.8 C)  TempSrc: Oral Oral Oral Oral  Resp: 18 20 20 20   Height:      Weight:      SpO2: 95% 100% 100% 99%   CBG (last 3)  No results found for this basename: GLUCAP,  in the last 72 hours  IV Fluid Intake:     MEDICATIONS  . aspirin  325 mg Oral Daily  . enoxaparin (LOVENOX) injection  40 mg Subcutaneous Q24H  . nicotine  14 mg Transdermal Q24H  . pneumococcal 23 valent vaccine  0.5 mL Intramuscular Tomorrow-1000  . thiamine  100 mg Intravenous Daily   PRN:  senna-docusate  Diet:  Cardiac thin liquids Activity:  Up with assistance DVT Prophylaxis:  lovenox  CLINICALLY SIGNIFICANT STUDIES Basic Metabolic Panel:  Recent Labs Lab 02/03/13 1323  NA 139  K 4.1  CL 102  CO2 22  GLUCOSE 107*  BUN 18  CREATININE 1.17  CALCIUM 9.8   Liver Function Tests: No results found for this basename: AST, ALT, ALKPHOS, BILITOT, PROT, ALBUMIN,  in the last 168 hours CBC:  Recent Labs Lab 02/03/13 1323  WBC 8.3  NEUTROABS 5.4  HGB 17.2*  HCT 49.4  MCV 83.6  PLT 259   Coagulation: No results found for this basename: LABPROT, INR,  in the last 168 hours Cardiac Enzymes: No  results found for this basename: CKTOTAL, CKMB, CKMBINDEX, TROPONINI,  in the last 168 hours Urinalysis: No results found for this basename: COLORURINE, APPERANCEUR, LABSPEC, PHURINE, GLUCOSEU, HGBUR, BILIRUBINUR, KETONESUR, PROTEINUR, UROBILINOGEN, NITRITE, LEUKOCYTESUR,  in the last 168 hours Lipid Panel    Component Value Date/Time   CHOL 205* 02/04/2013 0500   TRIG 189* 02/04/2013 0500   HDL 40 02/04/2013 0500   CHOLHDL 5.1 02/04/2013 0500   VLDL 38 02/04/2013 0500   LDLCALC 127* 02/04/2013 0500   Urine Drug Screen:   No results found for this basename: labopia, cocainscrnur, labbenz, amphetmu, thcu, labbarb    Alcohol Level: No results found for this basename: ETH,  in the last 168 hours  Dg Chest 2 View 02/03/2013    No acute abnormality. .   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    Mr [facility] Wo Contrast 02/03/2013   MRI HEAD Acute infarction throughout the left anterior cerebral artery territory.  Mild swelling but no evidence of hemorrhage or mass effect/shift.  Extensive chronic small vessel ischemic changes elsewhere throughout the cerebral hemispheric white matter bilaterally.  MRA HEAD  Abrupt occlusion of the left anterior cerebral artery 8 mm beyond its origin.  Both anterior cerebral arteries receive there supply from the left carotid circulation, the A1 segment on the right being likely aplastic.  Distal vessel atherosclerotic narrowing and irregularity diffusely.  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 The right vertebral artery is the dominant vessel.   .   2D Echocardiogram EF 65% , no regional wlal motion abnormalities, no cardiac source of embolism identified.  Carotid Doppler  No significant extracranial carotid artery stenosisdemonstrated. Vertebrals are patent with antegrade flow  EEG: this is an abnormal awake and drowsy EEG with findings consistent with focal neurological dysfunction  involving the left cerebral hemisphere, notably the left temporo-parietal region. Please, be aware that the absence of epileptiform discharges does not exclude the possibility of epilepsy  EKG  normal sinus rhythm.   Therapy Recommendations   Physical Exam   Mental Status:  Alert, oriented, thought content appropriate. Speech dysarthric with evidence of expressive aphasia. Able to follow 3 step commands without difficulty.  Cranial Nerves:  II: Discs flat bilaterally; Visual fields grossly normal, pupils equal, round, reactive to light and accommodation  III,IV, VI: ptosis on right present, extra-ocular motions intact bilaterally  V,VII: Face asymmetric on the right, facial light touch sensation normal bilaterally  VIII: hearing normal bilaterally  IX,X: gag reflex present  XI: bilateral shoulder shrug  XII: midline tongue extension  Motor:  Right : Upper extremity See note Left: Upper extremity 5/5  Lower extremity 0/5 Lower extremity 5/5  --unable to abduct right arm, no tricep strength, 4/5 bicep, weak grip and increased tone on right arm  --right leg has increased tone and no movement  Sensory: Pinprick and light touch intact throughout, bilaterally but feel his right leg is decreased  Deep Tendon Reflexes: 2+ left bicep and tricep, 3+right bicep, tricep and brachioradialis, 2+ right KJ and AJ, 1+ left KJ and no AJ and symmetric throughout  Plantars:  Right: downgoing Left: downgoing  Cerebellar:  normal finger-to-nose on left, normal heel-to-shin test on left   ASSESSMENT [name] is a 57 y.o. male presenting with right sided hemiparesis, expressive aphasia. 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 Infarct felt to be embolic, workup underway.  On no anticoagulants prior to admission. Now on aspirin 325 mg orally every day for secondary stroke prevention. Patient with resultant right hemiparesis and expressive aphasia. Work up  underway.   Hypertension  Hyperlipidemia, LDL 127  Smoker   HGBa1c 5.7  Hospital day # 1  TREATMENT/PLAN  Continue aspirin 325 mg orally every day for secondary stroke prevention.  Smoking cessation counseling  Risk factor modification  LDL is 127, goal < 100, add statin once LFTs checked  Therapy evaluations  hypercoag panel TEE to look for embolic source. I have arranged with [place] for 02/05/2013 (hopeful). If positive for PFO (patent foramen ovale), check bilateral lower extremity venous dopplers to rule out DVT as possible source of stroke.  Outpatient telemetry monitoring to assess patient for atrial fibrillation as source of stroke. I have arranged with [place].   Gwendolyn Lima. Manson Passey, [facility], MBA, MHA [facility] Pager: [phone]  02/04/2013 10:10 AM  I have personally obtained a history, examined the patient, evaluated imaging results, and formulated the assessment and plan of care. I agree with the above. Delia Heady, MD

## 2013-02-05 DIAGNOSIS — I6330 Cerebral infarction due to thrombosis of unspecified cerebral artery: Secondary | ICD-10-CM

## 2013-02-05 LAB — COMPREHENSIVE METABOLIC PANEL
Alkaline Phosphatase: 118 U/L — ABNORMAL HIGH (ref 39–117)
BUN: 14 mg/dL (ref 6–23)
Chloride: 99 mEq/L (ref 96–112)
GFR calc Af Amer: 84 mL/min — ABNORMAL LOW (ref >90)
Glucose, Bld: 110 mg/dL — ABNORMAL HIGH (ref 70–99)
Potassium: 3.4 mEq/L — ABNORMAL LOW (ref 3.5–5.1)
Total Bilirubin: 0.8 mg/dL (ref 0.3–1.2)

## 2013-02-05 MED ORDER — ATORVASTATIN CALCIUM 10 MG PO TABS
10.0000 mg | ORAL_TABLET | Freq: Every day | ORAL | Status: DC
  Administered 2013-02-05 – 2013-02-08 (×4): 10 mg via ORAL
  Filled 2013-02-05 (×5): qty 1

## 2013-02-05 MED ORDER — ACETAMINOPHEN 325 MG PO TABS
650.0000 mg | ORAL_TABLET | ORAL | Status: DC | PRN
  Administered 2013-02-05: 650 mg via ORAL
  Filled 2013-02-05: qty 2

## 2013-02-05 NOTE — Progress Notes (Signed)
Stroke Team Progress Note  HISTORY [name] is an 57 y.o. male who noted on Monday night he had a sudden onset of right arm and leg weakness (leg >arm) along with difficulty expressing himself. He did not seek medical attention at that time. Patient was brought to ED today due to continued symptoms and now unable to get out of bed. Patient states he takes no medications but does smoke 5 cigarettes/day.   Date last known well: 5.19.14  Time last known well: 2100  tPA Given: No: out of window  SUBJECTIVE No new complaints  OBJECTIVE Most recent Vital Signs: Filed Vitals:   02/04/13 1800 02/04/13 2200 02/05/13 0200 02/05/13 0600  BP: 153/97 147/97 148/94 144/83  Pulse: 92 73 68 71  Temp: 98.3 F (36.8 C) 97.5 F (36.4 C) 97.7 F (36.5 C) 98.1 F (36.7 C)  TempSrc: Oral Oral    Resp: 20 18 18 18   Height:      Weight:      SpO2: 99% 100% 100% 100%   CBG (last 3)  No results found for this basename: GLUCAP,  in the last 72 hours  IV Fluid Intake:     MEDICATIONS  . aspirin  325 mg Oral Daily  . enoxaparin (LOVENOX) injection  40 mg Subcutaneous Q24H  . nicotine  14 mg Transdermal Q24H  . thiamine  100 mg Oral Daily   PRN:  acetaminophen, senna-docusate  Diet:  Cardiac thin liquids Activity:  Up with assistance DVT Prophylaxis:  lovenox  CLINICALLY SIGNIFICANT STUDIES Basic Metabolic Panel:   Recent Labs Lab 02/03/13 1323 02/05/13 0530  NA 139 135  K 4.1 3.4*  CL 102 99  CO2 22 22  GLUCOSE 107* 110*  BUN 18 14  CREATININE 1.17 1.12  CALCIUM 9.8 9.1   Liver Function Tests:   Recent Labs Lab 02/05/13 0530  AST 27  ALT 32  ALKPHOS 118*  BILITOT 0.8  PROT 6.9  ALBUMIN 3.3*   CBC:   Recent Labs Lab 02/03/13 1323  WBC 8.3  NEUTROABS 5.4  HGB 17.2*  HCT 49.4  MCV 83.6  PLT 259   Coagulation: No results found for this basename: LABPROT, INR,  in the last 168 hours Cardiac Enzymes: No results found for this basename: CKTOTAL, CKMB, CKMBINDEX,  TROPONINI,  in the last 168 hours Urinalysis:   Recent Labs Lab 02/04/13 1315  COLORURINE YELLOW  LABSPEC 1.036*  PHURINE 5.5  GLUCOSEU NEGATIVE  HGBUR NEGATIVE  BILIRUBINUR SMALL*  KETONESUR NEGATIVE  PROTEINUR NEGATIVE  UROBILINOGEN 0.2  NITRITE NEGATIVE  LEUKOCYTESUR NEGATIVE   Lipid Panel    Component Value Date/Time   CHOL 205* 02/04/2013 0500   TRIG 189* 02/04/2013 0500   HDL 40 02/04/2013 0500   CHOLHDL 5.1 02/04/2013 0500   VLDL 38 02/04/2013 0500   LDLCALC 127* 02/04/2013 0500   Urine Drug Screen:      Component Value Date/Time   LABOPIA NONE DETECTED 02/04/2013 1315    Alcohol Level: No results found for this basename: ETH,  in the last 168 hours  Hypercoagulable Workup Normal - ESR, ANA, Anti III, homocysteine, Protein C total, Protein S total, cardiolipin antibody Pending - Protein S activity, lupus anticoagulant, cardiolipin antibody Prot C activity 147 (high)  Dg Chest 2 View 02/03/2013    No acute abnormality. .   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    MRI/MRA Head Wo Contrast 02/03/2013   MRI HEAD Acute infarction  throughout the left anterior cerebral artery territory.  Mild swelling but no evidence of hemorrhage or mass effect/shift.  Extensive chronic small vessel ischemic changes elsewhere throughout the cerebral hemispheric white matter bilaterally.  MRA HEAD  Abrupt occlusion of the left anterior cerebral artery 8 mm beyond its origin.  Both anterior cerebral arteries receive there supply from the left carotid circulation, the A1 segment on the right being likely aplastic.  Distal vessel atherosclerotic narrowing and irregularity diffusely.  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 The right vertebral artery is the dominant vessel.     2D Echocardiogram EF 65% , no regional wlal motion abnormalities, no cardiac source of embolism identified.  Carotid Doppler  No evidence of  hemodynamically significant internal carotid artery stenosis. Vertebral artery flow is antegrade.   EEG: this is an abnormal awake and drowsy EEG with findings consistent with focal neurological dysfunction involving the left cerebral hemisphere, notably the left temporo-parietal region. Please, be aware that the absence of epileptiform discharges does not exclude the possibility of epilepsy  EKG  normal sinus rhythm.   Therapy Recommendations CIR  Physical Exam   Mental Status:  Alert, oriented, thought content appropriate. Speech dysarthric with evidence of expressive aphasia. Able to follow 3 step commands without difficulty.  Cranial Nerves:  II: Discs flat bilaterally; Visual fields grossly normal, pupils equal, round, reactive to light and accommodation  III,IV, VI: ptosis on right present, extra-ocular motions intact bilaterally  V,VII: Face asymmetric on the right, facial light touch sensation normal bilaterally  VIII: hearing normal bilaterally  IX,X: gag reflex present  XI: bilateral shoulder shrug  XII: midline tongue extension  Motor:  Right : Upper extremity See note Left: Upper extremity 5/5  Lower extremity 0/5 Lower extremity 5/5  --unable to abduct right arm, no tricep strength, 4/5 bicep, weak grip and increased tone on right arm  --right leg has increased tone and no movement  Sensory: Pinprick and light touch intact throughout, bilaterally but feel his right leg is decreased  Deep Tendon Reflexes: 2+ left bicep and tricep, 3+right bicep, tricep and brachioradialis, 2+ right KJ and AJ, 1+ left KJ and no AJ and symmetric throughout  Plantars:  Right: downgoing Left: downgoing  Cerebellar:  normal finger-to-nose on left, normal heel-to-shin test on left  ASSESSMENT [name] is a 57 y.o. male presenting with right sided hemiparesis, expressive aphasia. 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 Infarct felt to be embolic, source unknown, workup  underway.  On no antithrombotics prior to admission. Now on aspirin 325 mg orally every day for secondary stroke prevention. Patient with resultant right hemiparesis and expressive aphasia.    Hypertension  Hyperlipidemia, LDL 127, not on statin prior to admission, not on statin now  Smoker   HGBa1c 5.7  UDS positive for THC, now associated with stroke  hypercoag panel thus far unrevealing  Hospital day # 2  TREATMENT/PLAN  Continue aspirin 325 mg orally every day for secondary stroke prevention.  Smoking cessation counseling  Risk factor modification  add statin   Add C3, C4, CH50, RPR and HIV to complete hypercoag and vasculitic panels TEE to look for embolic source 02/05/2013. If positive for PFO (patent foramen ovale), check bilateral lower extremity venous dopplers to rule out DVT as possible source of stroke.  Outpatient telemetry monitoring to assess patient for atrial fibrillation as source of stroke. arranged with [place]. Discharge dispo:  CIR  SHARON BIBY, MSN, RN, ANVP-BC, ANP-BC, GNP-BC [facility] Pager: [phone] 02/05/2013 10:29 AM  I have personally obtained a history, examined the patient, evaluated imaging results, and formulated the assessment and plan of care. I agree with the above. Delia Heady, MD

## 2013-02-05 NOTE — Progress Notes (Signed)
[facility] PROGRESS NOTE  Tauno Falotico AVW:[id number] DOB: October 03, 1955 DOA: 02/03/2013 PCP: Benita Stabile, MD  Assessment/Plan: Acute left ACA CVA -2-D echo/carotid Doppler are within normal limits. -On aspirin for secondary stroke prevention. -PT recommending CIR. Consult requested. -Smoking cessation will be important in this gentleman.  Hypertension -Allow permissive hypertension given CVA.  Tobacco abuse -Cessation encouraged.  Alcohol abuse -Thiamine, multivitamin. -Monitor for withdrawals.  DVT prophylaxis -Lovenox.  Code Status: Full code Family Communication: Patient only  Disposition Plan: Possible CIR   Consultants:  Neurology, Dr. Leroy Kennedy   Antibiotics:  None   Subjective: No complaints  Objective: Filed Vitals:   02/04/13 1800 02/04/13 2200 02/05/13 0200 02/05/13 0600  BP: 153/97 147/97 148/94 144/83  Pulse: 92 73 68 71  Temp: 98.3 F (36.8 C) 97.5 F (36.4 C) 97.7 F (36.5 C) 98.1 F (36.7 C)  TempSrc: Oral Oral    Resp: 20 18 18 18   Height:      Weight:      SpO2: 99% 100% 100% 100%    Intake/Output Summary (Last 24 hours) at 02/05/13 8295 Last data filed at 02/04/13 1312  Gross per 24 hour  Intake      0 ml  Output    420 ml  Net   -420 ml   Filed Weights   02/03/13 1805  Weight: 83.915 kg (185 lb)    Exam:   General:  Alert, awake, oriented x3, in no distress  Cardiovascular: Regular rate and rhythm, no murmurs, rubs or gallops  Respiratory: Clear to auscultation bilaterally  Abdomen: Soft, nontender, nondistended, positive bowel sounds, no masses or organomegaly noted  Extremities: No clubbing, cyanosis or edema, positive pedal pulses.   Neurologic:  Right-sided flaccid paralysis  Data Reviewed: Basic Metabolic Panel:  Recent Labs Lab 02/03/13 1323 02/05/13 0530  NA 139 135  K 4.1 3.4*  CL 102 99  CO2 22 22  GLUCOSE 107* 110*  BUN 18 14  CREATININE 1.17 1.12  CALCIUM 9.8 9.1   Liver  Function Tests:  Recent Labs Lab 02/05/13 0530  AST 27  ALT 32  ALKPHOS 118*  BILITOT 0.8  PROT 6.9  ALBUMIN 3.3*   No results found for this basename: LIPASE, AMYLASE,  in the last 168 hours No results found for this basename: AMMONIA,  in the last 168 hours CBC:  Recent Labs Lab 02/03/13 1323  WBC 8.3  NEUTROABS 5.4  HGB 17.2*  HCT 49.4  MCV 83.6  PLT 259   Cardiac Enzymes: No results found for this basename: CKTOTAL, CKMB, CKMBINDEX, TROPONINI,  in the last 168 hours BNP (last 3 results) No results found for this basename: PROBNP,  in the last 8760 hours CBG: No results found for this basename: GLUCAP,  in the last 168 hours  No results found for this or any previous visit (from the past 240 hour(s)).   Studies: Dg Chest 2 View  02/03/2013   *RADIOLOGY REPORT*  Clinical Data: Stroke  CHEST - 2 VIEW  Comparison: None.  Findings: The lungs are clear.  Negative for pneumonia or heart failure.  No mass lesion is identified.  Heart size is within normal limits.  IMPRESSION: No acute abnormality.   Original Report Authenticated By: Janeece Riggers, M.D.   Ct Head Wo Contrast  02/03/2013   *RADIOLOGY REPORT*  Clinical Data: Acute stroke.  Occipital headache.  CT HEAD WITHOUT CONTRAST  Technique:  Contiguous axial images were obtained from the base of the skull through the vertex  without contrast.  Comparison: None.  Findings: There is a subacute nonhemorrhagic left anterior cerebral artery distribution infarct with secondary edema without midline shift and only minimal mass  effect upon the anterior aspect of the interhemispheric falx.  Ventricles are not compressed.  The brain parenchyma is otherwise normal.  No osseous abnormality.  IMPRESSION: Non-hemorrhagic subacute left anterior cerebral artery infarct.   Original Report Authenticated By: Francene Boyers, M.D.   Mr [facility] Wo Contrast  02/03/2013   *RADIOLOGY REPORT*  Clinical Data:  Right-sided weakness beginning 48 hours  ago.  MRI HEAD WITHOUT CONTRAST MRA HEAD WITHOUT CONTRAST  Technique:  Multiplanar, multiecho pulse sequences of the brain and surrounding structures were obtained without intravenous contrast. Angiographic images of the head were obtained using MRA technique without contrast.  Comparison:  Head CT same day  MRI HEAD  Findings:  There is minimal chronic small vessel change within the pons.  No cerebellar abnormality.  There is acute infarction throughout the majority of the left anterior cerebral artery territory including the corpus callosum to the left of midline.  No other areas of acute infarction.  There are extensive chronic small vessel ischemic changes throughout the hemispheric white matter bilaterally.  There is an old lacunar infarction in the right basal ganglia.  No sign of acute hemorrhage, hydrocephalus, mass lesion or extra-axial collection.  The area of infarction shows mild swelling.  No pituitary mass.  There is a small amount of fluid in the left division of the sphenoid sinus.  The other sinuses are clear.  IMPRESSION: Acute infarction throughout the left anterior cerebral artery territory.  Mild swelling but no evidence of hemorrhage or mass effect/shift.  Extensive chronic small vessel ischemic changes elsewhere throughout the cerebral hemispheric white matter bilaterally.  MRA HEAD  Findings: Both internal carotid arteries are patent through the siphon regions without apparent stenosis.  On the right, the internal carotid artery supplies only the middle cerebral artery territory.  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  No MCA stenosis in the proximal or major branches.  More distal branch vessels do show atherosclerotic irregularity.  The left internal carotid artery supplies the left middle cerebral artery territory and both anterior cerebral artery territories. There is abrupt occlusion of the left anterior cerebral artery 8 mm beyond its origin.  There is mild distal vessel atherosclerotic  narrowing and irregularity diffusely.  Both vertebral arteries are patent to the basilar with the right being dominant.  There is stenosis at the left vertebral basilar junction.  No basilar stenosis.  Posterior circulation branch vessels are patent but do show distal vessel atherosclerotic irregularity.  IMPRESSION: Abrupt occlusion of the left anterior cerebral artery 8 mm beyond its origin.  Both anterior cerebral arteries receive there supply from the left carotid circulation, the A1 segment on the right being likely aplastic.  Distal vessel atherosclerotic narrowing and irregularity diffusely.  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 The right vertebral artery is the dominant vessel.   Original Report Authenticated By: Paulina Fusi, M.D.   Mr Brain Wo Contrast  02/03/2013   *RADIOLOGY REPORT*  Clinical Data:  Right-sided weakness beginning 48 hours ago.  MRI HEAD WITHOUT CONTRAST MRA HEAD WITHOUT CONTRAST  Technique:  Multiplanar, multiecho pulse sequences of the brain and surrounding structures were obtained without intravenous contrast. Angiographic images of the head were obtained using MRA technique without contrast.  Comparison:  Head CT same day  MRI HEAD  Findings:  There is minimal chronic  small vessel change within the pons.  No cerebellar abnormality.  There is acute infarction throughout the majority of the left anterior cerebral artery territory including the corpus callosum to the left of midline.  No other areas of acute infarction.  There are extensive chronic small vessel ischemic changes throughout the hemispheric white matter bilaterally.  There is an old lacunar infarction in the right basal ganglia.  No sign of acute hemorrhage, hydrocephalus, mass lesion or extra-axial collection.  The area of infarction shows mild swelling.  No pituitary mass.  There is a small amount of fluid in the left division of the sphenoid sinus.  The other sinuses are clear.  IMPRESSION:  Acute infarction throughout the left anterior cerebral artery territory.  Mild swelling but no evidence of hemorrhage or mass effect/shift.  Extensive chronic small vessel ischemic changes elsewhere throughout the cerebral hemispheric white matter bilaterally.  MRA HEAD  Findings: Both internal carotid arteries are patent through the siphon regions without apparent stenosis.  On the right, the internal carotid artery supplies only the middle cerebral artery territory.  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  No MCA stenosis in the proximal or major branches.  More distal branch vessels do show atherosclerotic irregularity.  The left internal carotid artery supplies the left middle cerebral artery territory and both anterior cerebral artery territories. There is abrupt occlusion of the left anterior cerebral artery 8 mm beyond its origin.  There is mild distal vessel atherosclerotic narrowing and irregularity diffusely.  Both vertebral arteries are patent to the basilar with the right being dominant.  There is stenosis at the left vertebral basilar junction.  No basilar stenosis.  Posterior circulation branch vessels are patent but do show distal vessel atherosclerotic irregularity.  IMPRESSION: Abrupt occlusion of the left anterior cerebral artery 8 mm beyond its origin.  Both anterior cerebral arteries receive there supply from the left carotid circulation, the A1 segment on the right being likely aplastic.  Distal vessel atherosclerotic narrowing and irregularity diffusely.  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 The right vertebral artery is the dominant vessel.   Original Report Authenticated By: Paulina Fusi, M.D.    Scheduled Meds: . aspirin  325 mg Oral Daily  . enoxaparin (LOVENOX) injection  40 mg Subcutaneous Q24H  . nicotine  14 mg Transdermal Q24H  . thiamine  100 mg Oral Daily   Continuous Infusions:   Active Problems:   CVA (cerebral infarction)   HTN  (hypertension)   Tobacco abuse    Time spent: 35 minutes    HERNANDEZ ACOSTA,Paxtyn Boyar  [facility] Pager [phone]  If 7PM-7AM, please contact night-coverage at [email or web address], password [facility] 02/05/2013, 9:07 AM  LOS: 2 days

## 2013-02-05 NOTE — Progress Notes (Signed)
Physical Therapy Treatment Patient Details Name: [name] MRN: [id number] DOB: [birth year] Today's Date: 02/05/2013 Time: 4098-1191 PT Time Calculation (min): 41 min  PT Assessment / Plan / Recommendation Comments on Treatment Session  Emphasis on dynamic sitting and static/dyn standing balance with work on w/shifting and control of R knee and trunk. Pt showed signs of les R neglect. Pt with improved safety awareness, and improving awareness of deficits, balance improving.  Continue to recommend CIR    Follow Up Recommendations  CIR     Does the patient have the potential to tolerate intense rehabilitation     Barriers to Discharge        Equipment Recommendations  Other (comment)    Recommendations for Other Services Rehab consult  Frequency Min 4X/week   Plan Discharge plan remains appropriate;Frequency remains appropriate    Precautions / Restrictions Precautions Precautions: Fall Precaution Comments: Pt impulsive with poor awareness of deficits; Rt. neglect Restrictions Weight Bearing Restrictions: No   Pertinent Vitals/Pain     Mobility  Bed Mobility Bed Mobility: Supine to Sit;Sitting - Scoot to Edge of Bed;Sit to Supine Supine to Sit: 4: Min assist;With rails;HOB elevated Sitting - Scoot to Edge of Bed: 4: Min assist;With rail Sit to Supine: 3: Mod assist;With rail;Other (comment) Details for Bed Mobility Assistance: Pt heavily dependent on rail.  Pt muscles his way to EOB using Lt. side.  Requires assist to lift Rt. LE onto bed Transfers Transfers: Sit to Stand;Stand to Sit Sit to Stand: 3: Mod assist;With upper extremity assist;From bed Sit to Stand: Patient Percentage: 40% Stand to Sit: 3: Mod assist;To bed;With upper extremity assist Stand to Sit: Patient Percentage: 40% Details for Transfer Assistance: Pt leaning to Lt.  Facilitation for equal weighbearing and for normal alignment during transitions Ambulation/Gait Ambulation/Gait Assistance: Not tested  (comment) Gait Pattern: Ataxic;Narrow base of support Stairs: No Modified Rankin (Stroke Patients Only) Modified Rankin: Moderately severe disability    Exercises     PT Diagnosis:    PT Problem List:   PT Treatment Interventions:     PT Goals Acute Rehab PT Goals PT Goal Formulation: With patient Potential to Achieve Goals: Fair Pt will Roll Supine to Right Side: with supervision PT Goal: Rolling Supine to Right Side - Progress: Progressing toward goal Pt will Roll Supine to Left Side: with supervision PT Goal: Rolling Supine to Left Side - Progress: Progressing toward goal PT Goal: Supine/Side to Sit - Progress: Partly met Pt will go Sit to Stand: with min assist PT Goal: Sit to Stand - Progress: Progressing toward goal Pt will Ambulate: 16 - 50 feet;with least restrictive assistive device;with mod assist PT Goal: Ambulate - Progress: Not met Additional Goals Additional Goal #1: pregait activity at EOB with mod assist PT Goal: Additional Goal #1 - Progress: Progressing toward goal  Visit Information  Last PT Received On: 02/05/13 Assistance Needed: +2    Subjective Data  Subjective: Bye.Marland KitchenMarland KitchenThankyou   Cognition  Cognition Arousal/Alertness: Awake/alert Behavior During Therapy: WFL for tasks assessed/performed Overall Cognitive Status: Impaired/Different from baseline Area of Impairment: Attention;Awareness;Problem solving Current Attention Level: Selective Safety/Judgement: Decreased awareness of deficits;Decreased awareness of safety Awareness: Intellectual Problem Solving: Decreased initiation;Difficulty sequencing;Requires verbal cues;Requires tactile cues General Comments: Pt less impulsive today, and demonstrates improved awareness of Rt body    Balance  Balance Balance Assessed: Yes Static Sitting Balance Static Sitting - Balance Support: No upper extremity supported;Feet supported Static Sitting - Level of Assistance: 5: Stand by assistance Static Sitting  -  Comment/# of Minutes: 3-5 min Dynamic Sitting Balance Dynamic Sitting - Balance Support: Feet supported Dynamic Sitting - Level of Assistance: 5: Stand by assistance;2: Max assist Dynamic Sitting Balance - Compensations: with grooming activity balance min guard to supervision; however, when pt leaning forward in attempt to don Rt. sock, required min guard assist to max A as he lost balance heavily to Rt. but was able to demonstrate awarenss of balance loss as he initiated a balance reaction and was fearful of trying again Dynamic Sitting - Balance Activities:  (ADL) Static Standing Balance Static Standing - Balance Support: Left upper extremity supported;Right upper extremity supported Static Standing - Level of Assistance: 4: Min assist;3: Mod assist Static Standing - Comment/# of Minutes: Varieable level of assist Dynamic Standing Balance Dynamic Standing - Balance Support: Left upper extremity supported Dynamic Standing - Level of Assistance: 1: +2 Total assist (~40%)  End of Session PT - End of Session Activity Tolerance: Patient tolerated treatment well Patient left: in bed;with bed alarm set Nurse Communication: Mobility status   GP     Rosaisela Jamroz, Eliseo Gum 02/05/2013, 6:18 PM 02/05/2013  [place], PT [phone]  (pager)

## 2013-02-05 NOTE — Progress Notes (Signed)
*  PRELIMINARY RESULTS* Vascular Ultrasound Carotid Duplex (Doppler) has been completed.  There is no evidence of hemodynamically significant internal carotid artery stenosis bilaterally. Vertebral arteries are patent with antegrade flow.  02/05/2013 11:38 AM Gertie Fey, RDMS, RDCS

## 2013-02-05 NOTE — Consult Note (Signed)
Physical Medicine and Rehabilitation Consult Reason for Consult: CVA Referring Physician: [facility]   HPI: [name] is a 57 y.o. right-handed male with history of hypertension. Admitted 02/03/2013 with right-sided weakness and aphasia. MRI of the brain showed acute infarct throughout the left anterior cerebral artery territory. MRA of the head with abrupt occlusion of the left anterior cerebral artery 8 mm beyond its origin. Patient did not receive TPA. EEG consistent with focal neurological dysfunction involving the left cerebral hemisphere notably the left temporal parietal region. Echocardiogram with ejection fraction of 65% without embolism. Carotid Dopplers with no ICA stenosis. Neurology services consulted placed on aspirin for stroke prophylaxis as well as subcutaneous Lovenox for DVT prophylaxis. Physical and occupational therapy evaluations completed recommendations for physical medicine rehabilitation consult to consider inpatient rehabilitation services   Review of Systems  Cardiovascular: Positive for palpitations.  Musculoskeletal: Positive for myalgias.  All other systems reviewed and are negative.   Past Medical History  Diagnosis Date  . Hypertension   . Anginal pain   . Heart murmur   . Stroke 02/01/2013    left frontal CVA and symptoms of right arm and leg weakness and expressive aphasia/notes 02/03/2013 (02/03/2013)  . Psoriasis     "back and stomach" (02/03/2013)   Past Surgical History  Procedure Laterality Date  . No past surgeries     History reviewed. No pertinent family history. Social History:  reports that he has been smoking Cigarettes.  He has a 9.25 pack-year smoking history. He does not have any smokeless tobacco history on file. He reports that he drinks about 10.2 ounces of alcohol per week. He reports that he does not use illicit drugs. Allergies:  Allergies  Allergen Reactions  . Shellfish Allergy Itching   Medications Prior to Admission  Medication  Sig Dispense Refill  . clobetasol cream (TEMOVATE) 0.05 % Apply 1 application topically daily as needed (psoriasis).      Marland Kitchen desonide (DESOWEN) 0.05 % cream Apply 1 application topically daily as needed (psoriasis).      . hydrochlorothiazide (HYDRODIURIL) 25 MG tablet Take 25 mg by mouth daily.      . hydrOXYzine (ATARAX/VISTARIL) 10 MG tablet Take 10 mg by mouth daily.        Home: Home Living Lives With: Spouse Available Help at Discharge: Family Type of Home: Mobile home Home Access: Stairs to enter Entrance Stairs-Number of Steps: 0 at front and 3 at back ?? Home Layout: One level Bathroom Shower/Tub: Engineer, manufacturing systems: Standard Home Adaptive Equipment: None Additional Comments: Pt did not mention living with son during OT/PT eval.  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 Pt unable to state if he stopped working due to disability or for other reason Functional Status:  Mobility: Bed Mobility Bed Mobility: Supine to Sit;Sitting - Scoot to Edge of Bed;Sit to Supine Supine to Sit: 4: Min assist;With rails;HOB elevated Sitting - Scoot to Edge of Bed: 4: Min assist;With rail Sit to Supine: 3: Mod assist;With rail;Other (comment) (mostly at R Le) Transfers Transfers: Sit to Stand;Stand to Sit Sit to Stand: 1: +2 Total assist;With upper extremity assist;From bed;From toilet Sit to Stand: Patient Percentage: 40% Stand to Sit: 1: +2 Total assist;With upper extremity assist;To bed;To toilet Stand to Sit: Patient Percentage: 40% Ambulation/Gait Ambulation/Gait Assistance: 1: +  2 Total assist Ambulation/Gait: Patient Percentage: 40% Ambulation Distance (Feet): 15 Feet (times 2) Assistive device: 2 person hand held assist Ambulation/Gait Assistance Details:  heavy list to R with little ability to w/shift L, making it difficult to advance R LE and uncoordinated/ataxic at best. Gait Pattern: Step-to pattern;Decreased step length - left;Decreased step length - right;Decreased stance time - right;Decreased stride length;Ataxic;Narrow base of support Stairs: No    ADL: ADL Eating/Feeding: Supervision/safety Where Assessed - Eating/Feeding: Bed level Grooming: Brushing hair;Supervision/safety Where Assessed - Grooming: Supine, head of bed up Upper Body Bathing: Moderate assistance Where Assessed - Upper Body Bathing: Supine, head of bed up;Supported sitting Lower Body Bathing: +2 Total assistance Where Assessed - Lower Body Bathing: Supported sit to stand Upper Body Dressing: Maximal assistance Where Assessed - Upper Body Dressing: Supported sitting Lower Body Dressing: +2 Total assistance Where Assessed - Lower Body Dressing: Supported sit to Pharmacist, hospital: +2 Total assistance Toilet Transfer Method: Sit to stand;Stand pivot Acupuncturist: Comfort height toilet;Grab bars Transfers/Ambulation Related to ADLs: Pt ambulated to bathroom with total A +2 (pt~40%).  Pt leaning heavily to Rt. side and difficulty advancing Rt. LE  Cognition: Cognition Overall Cognitive Status: Impaired/Different from baseline Arousal/Alertness: Awake/alert Orientation Level: Oriented X4 Attention: Sustained Sustained Attention: Impaired Sustained Attention Impairment: Functional basic Memory: Impaired Memory Impairment:  (pt admits to memory changes, did not recall testing results) Problem Solving: Impaired Problem Solving Impairment: Functional basic Cognition Arousal/Alertness: Awake/alert Behavior During Therapy: Flat affect Overall Cognitive Status: Impaired/Different from baseline Area of Impairment: Safety/judgement;Awareness Safety/Judgement: Decreased awareness of safety;Decreased awareness of deficits Awareness:  Intellectual General Comments: Pt is impulsive. Initially he states he is in the hospital because he can't urinate. Then he acknowleges he had a stroke and couldn't walk at home, but insists he is able to walk now and he is fine with no weakness. Pt. unable to move Rt LE to command, is able to activate Rt. LE when ambulating. Pt. will move Rt. UE to command, but not fully. When pt returned to bed, he acknowledges Rt. LE weakness, but insists Rt. UE is normal. Pt attempting to move sit to stand, pull up pants after toileting, etc while leanind heavily to Rt (requiring max A) with no awareness that he is leaning, or needing assistance.   Blood pressure 144/83, pulse 71, temperature 98.1 F (36.7 C), temperature source Oral, resp. rate 18, height 5\' 9"  (1.753 m), weight 83.915 kg (185 lb), SpO2 100.00%. Physical Exam  Vitals reviewed. HENT:  Head: Normocephalic.  Eyes: EOM are normal.  Neck: Neck supple. No thyromegaly present.  Cardiovascular: Normal rate and regular rhythm.   Pulmonary/Chest: Effort normal and breath sounds normal. No respiratory distress.  Abdominal: Soft. Bowel sounds are normal. He exhibits no distension.  Neurological: He is alert.  Patient with aphasia. He was able to state his name but was inconsistent with his home living situation. Identified multiple objects for me. Some apraxia noted. He did follow simple commands. RUE is tr to 1+/5 and inconsistent. RLE is trace to 1 at HF and KE and 0/5 distally. Decreased sensation to light touch but could grossly sense pain.   Skin: Skin is warm and dry.  Psychiatric:  Very flat.    Results for orders placed during the hospital encounter of 02/03/13 (from the past 24 hour(s))  ANTITHROMBIN III     Status: None   Collection Time    02/04/13 12:00 PM      Result Value Range  AntiThromb III Func 116  75 - 120 %  HOMOCYSTEINE     Status: None   Collection Time    02/04/13 12:00 PM      Result Value Range   Homocysteine 10.6   4.0 - 15.4 umol/L  SEDIMENTATION RATE     Status: None   Collection Time    02/04/13 12:00 PM      Result Value Range   Sed Rate 5  0 - 16 mm/hr  URINALYSIS, ROUTINE W REFLEX MICROSCOPIC     Status: Abnormal   Collection Time    02/04/13  1:15 PM      Result Value Range   Color, Urine YELLOW  YELLOW   APPearance CLEAR  CLEAR   Specific Gravity, Urine 1.036 (*) 1.005 - 1.030   pH 5.5  5.0 - 8.0   Glucose, UA NEGATIVE  NEGATIVE mg/dL   Hgb urine dipstick NEGATIVE  NEGATIVE   Bilirubin Urine SMALL (*) NEGATIVE   Ketones, ur NEGATIVE  NEGATIVE mg/dL   Protein, ur NEGATIVE  NEGATIVE mg/dL   Urobilinogen, UA 0.2  0.0 - 1.0 mg/dL   Nitrite NEGATIVE  NEGATIVE   Leukocytes, UA NEGATIVE  NEGATIVE  URINE RAPID DRUG SCREEN (HOSP PERFORMED)     Status: Abnormal   Collection Time    02/04/13  1:15 PM      Result Value Range   Opiates NONE DETECTED  NONE DETECTED   Cocaine NONE DETECTED  NONE DETECTED   Benzodiazepines NONE DETECTED  NONE DETECTED   Amphetamines NONE DETECTED  NONE DETECTED   Tetrahydrocannabinol POSITIVE (*) NONE DETECTED   Barbiturates NONE DETECTED  NONE DETECTED  COMPREHENSIVE METABOLIC PANEL     Status: Abnormal   Collection Time    02/05/13  5:30 AM      Result Value Range   Sodium 135  135 - 145 mEq/L   Potassium 3.4 (*) 3.5 - 5.1 mEq/L   Chloride 99  96 - 112 mEq/L   CO2 22  19 - 32 mEq/L   Glucose, Bld 110 (*) 70 - 99 mg/dL   BUN 14  6 - 23 mg/dL   Creatinine, Ser 1.19  0.50 - 1.35 mg/dL   Calcium 9.1  8.4 - 14.7 mg/dL   Total Protein 6.9  6.0 - 8.3 g/dL   Albumin 3.3 (*) 3.5 - 5.2 g/dL   AST 27  0 - 37 U/L   ALT 32  0 - 53 U/L   Alkaline Phosphatase 118 (*) 39 - 117 U/L   Total Bilirubin 0.8  0.3 - 1.2 mg/dL   GFR calc non Af Amer 72 (*) >90 mL/min   GFR calc Af Amer 84 (*) >90 mL/min   Dg Chest 2 View  02/03/2013   *RADIOLOGY REPORT*  Clinical Data: Stroke  CHEST - 2 VIEW  Comparison: None.  Findings: The lungs are clear.  Negative for pneumonia or  heart failure.  No mass lesion is identified.  Heart size is within normal limits.  IMPRESSION: No acute abnormality.   Original Report Authenticated By: Janeece Riggers, M.D.   Ct Head Wo Contrast  02/03/2013   *RADIOLOGY REPORT*  Clinical Data: Acute stroke.  Occipital headache.  CT HEAD WITHOUT CONTRAST  Technique:  Contiguous axial images were obtained from the base of the skull through the vertex without contrast.  Comparison: None.  Findings: There is a subacute nonhemorrhagic left anterior cerebral artery distribution infarct with secondary edema without midline shift and only minimal  mass  effect upon the anterior aspect of the interhemispheric falx.  Ventricles are not compressed.  The brain parenchyma is otherwise normal.  No osseous abnormality.  IMPRESSION: Non-hemorrhagic subacute left anterior cerebral artery infarct.   Original Report Authenticated By: Francene Boyers, M.D.   Mr [facility] Wo Contrast  02/03/2013   *RADIOLOGY REPORT*  Clinical Data:  Right-sided weakness beginning 48 hours ago.  MRI HEAD WITHOUT CONTRAST MRA HEAD WITHOUT CONTRAST  Technique:  Multiplanar, multiecho pulse sequences of the brain and surrounding structures were obtained without intravenous contrast. Angiographic images of the head were obtained using MRA technique without contrast.  Comparison:  Head CT same day  MRI HEAD  Findings:  There is minimal chronic small vessel change within the pons.  No cerebellar abnormality.  There is acute infarction throughout the majority of the left anterior cerebral artery territory including the corpus callosum to the left of midline.  No other areas of acute infarction.  There are extensive chronic small vessel ischemic changes throughout the hemispheric white matter bilaterally.  There is an old lacunar infarction in the right basal ganglia.  No sign of acute hemorrhage, hydrocephalus, mass lesion or extra-axial collection.  The area of infarction shows mild swelling.  No pituitary  mass.  There is a small amount of fluid in the left division of the sphenoid sinus.  The other sinuses are clear.  IMPRESSION: Acute infarction throughout the left anterior cerebral artery territory.  Mild swelling but no evidence of hemorrhage or mass effect/shift.  Extensive chronic small vessel ischemic changes elsewhere throughout the cerebral hemispheric white matter bilaterally.  MRA HEAD  Findings: Both internal carotid arteries are patent through the siphon regions without apparent stenosis.  On the right, the internal carotid artery supplies only the middle cerebral artery territory.  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  No MCA stenosis in the proximal or major branches.  More distal branch vessels do show atherosclerotic irregularity.  The left internal carotid artery supplies the left middle cerebral artery territory and both anterior cerebral artery territories. There is abrupt occlusion of the left anterior cerebral artery 8 mm beyond its origin.  There is mild distal vessel atherosclerotic narrowing and irregularity diffusely.  Both vertebral arteries are patent to the basilar with the right being dominant.  There is stenosis at the left vertebral basilar junction.  No basilar stenosis.  Posterior circulation branch vessels are patent but do show distal vessel atherosclerotic irregularity.  IMPRESSION: Abrupt occlusion of the left anterior cerebral artery 8 mm beyond its origin.  Both anterior cerebral arteries receive there supply from the left carotid circulation, the A1 segment on the right being likely aplastic.  Distal vessel atherosclerotic narrowing and irregularity diffusely.  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 The right vertebral artery is the dominant vessel.   Original Report Authenticated By: Paulina Fusi, M.D.   Mr Brain Wo Contrast  02/03/2013   *RADIOLOGY REPORT*  Clinical Data:  Right-sided weakness beginning 48 hours ago.  MRI HEAD WITHOUT CONTRAST MRA  HEAD WITHOUT CONTRAST  Technique:  Multiplanar, multiecho pulse sequences of the brain and surrounding structures were obtained without intravenous contrast. Angiographic images of the head were obtained using MRA technique without contrast.  Comparison:  Head CT same day  MRI HEAD  Findings:  There is minimal chronic small vessel change within the pons.  No cerebellar abnormality.  There is acute infarction throughout the majority of the left anterior cerebral artery territory including the  corpus callosum to the left of midline.  No other areas of acute infarction.  There are extensive chronic small vessel ischemic changes throughout the hemispheric white matter bilaterally.  There is an old lacunar infarction in the right basal ganglia.  No sign of acute hemorrhage, hydrocephalus, mass lesion or extra-axial collection.  The area of infarction shows mild swelling.  No pituitary mass.  There is a small amount of fluid in the left division of the sphenoid sinus.  The other sinuses are clear.  IMPRESSION: Acute infarction throughout the left anterior cerebral artery territory.  Mild swelling but no evidence of hemorrhage or mass effect/shift.  Extensive chronic small vessel ischemic changes elsewhere throughout the cerebral hemispheric white matter bilaterally.  MRA HEAD  Findings: Both internal carotid arteries are patent through the siphon regions without apparent stenosis.  On the right, the internal carotid artery supplies only the middle cerebral artery territory.  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  No MCA stenosis in the proximal or major branches.  More distal branch vessels do show atherosclerotic irregularity.  The left internal carotid artery supplies the left middle cerebral artery territory and both anterior cerebral artery territories. There is abrupt occlusion of the left anterior cerebral artery 8 mm beyond its origin.  There is mild distal vessel atherosclerotic narrowing and irregularity  diffusely.  Both vertebral arteries are patent to the basilar with the right being dominant.  There is stenosis at the left vertebral basilar junction.  No basilar stenosis.  Posterior circulation branch vessels are patent but do show distal vessel atherosclerotic irregularity.  IMPRESSION: Abrupt occlusion of the left anterior cerebral artery 8 mm beyond its origin.  Both anterior cerebral arteries receive there supply from the left carotid circulation, the A1 segment on the right being likely aplastic.  Distal vessel atherosclerotic narrowing and irregularity diffusely.  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 The right vertebral artery is the dominant vessel.   Original Report Authenticated By: Paulina Fusi, M.D.    Assessment/Plan: Diagnosis: left ACA infarct 1. Does the need for close, 24 hr/day medical supervision in concert with the patient's rehab needs make it unreasonable for this patient to be served in a less intensive setting? Yes 2. Co-Morbidities requiring supervision/potential complications: htn 3. Due to bladder management, bowel management, safety, skin/wound care, disease management, medication administration, pain management and patient education, does the patient require 24 hr/day rehab nursing? Yes 4. Does the patient require coordinated care of a physician, rehab nurse, PT (1-2 hrs/day, 5 days/week), OT (1-2 hrs/day, 5 days/week) and SLP (1-2 hrs/day, 5 days/week) to address physical and functional deficits in the context of the above medical diagnosis(es)? Yes Addressing deficits in the following areas: balance, endurance, locomotion, strength, transferring, bowel/bladder control, bathing, dressing, feeding, grooming, toileting, cognition, speech, language, swallowing and psychosocial support 5. Can the patient actively participate in an intensive therapy program of at least 3 hrs of therapy per day at least 5 days per week? Yes 6. The potential for patient  to make measurable gains while on inpatient rehab is excellent 7. Anticipated functional outcomes upon discharge from inpatient rehab are min assist with PT, min to mod assist with OT, min to mod assist with SLP. 8. Estimated rehab length of stay to reach the above functional goals is: 2-3 weeks 9. Does the patient have adequate social supports to accommodate these discharge functional goals? Yes 10. Anticipated D/C setting: Home 11. Anticipated post D/C treatments: HH therapy 12. Overall Rehab/Functional Prognosis:  excellent  RECOMMENDATIONS: This patient's condition is appropriate for continued rehabilitative care in the following setting: CIR Patient has agreed to participate in recommended program. Yes Note that insurance prior authorization may be required for reimbursement for recommended care.  Comment:Rehab RN to follow up.   Ranelle Oyster, MD, [place] [facility]     02/05/2013

## 2013-02-05 NOTE — Progress Notes (Signed)
Occupational Therapy Treatment Patient Details Name: [name] MRN: [id number] DOB: [birth year] Today's Date: 02/05/2013 Time: 4098-1191 OT Time Calculation (min): 38 min  OT Assessment / Plan / Recommendation Comments on Treatment Session  Pt demonstrates significant improvement with Rt neglect.  Pt noted to use Rt. UE more spontaneously, and was able to self correct without cues if he attempted to use Lt. UE.  Pt with improved safety awareness, and improving awareness of deficits, balance improving.  Continue to recommend CIR    Follow Up Recommendations  CIR;Supervision/Assistance - 24 hour    Barriers to Discharge       Equipment Recommendations  None recommended by OT    Recommendations for Other Services Rehab consult  Frequency Min 2X/week   Plan Discharge plan remains appropriate    Precautions / Restrictions Precautions Precautions: Fall Precaution Comments: Pt impulsive with poor awareness of deficits; Rt. neglect Restrictions Weight Bearing Restrictions: No   Pertinent Vitals/Pain     ADL  Grooming: Teeth care;Performed;Minimal assistance Where Assessed - Grooming: Unsupported sitting Upper Body Dressing: Moderate assistance (gown) Where Assessed - Upper Body Dressing: Unsupported sitting ADL Comments: Worked on standing balance with facilitation of Rt. knee for extension, weight shifting to Rt.  Facilitation provided Rt. quads, hips for extension and shoulder/scap for trunk extension.  Pt following functional commands to use Rt. UE with ~75% accuracy and min facilitation.  Pt required mod tactile and verbal cues to use bil. UEs to open toothpaste container, but proceeded to brush teeth with Rt. UE - difficulty noted with rapid flex/ext of elbow, and pt noted to move head back and forth instead.  Pt required max assist to don Rt. sock, but with mod facilitation he did attempt to use Rt. UE to assist.  Pt noted to self correct independently today when he would attempt  to use Lt. UE when command was for Rt. UE.  Pt also noted to spit water all over self and floor before grabbing the emisis basin - question if pt has difficulty maintaing adequate closure of lips, or if this was attentional/cogntive deficit.  Pt. indicates fatigue at end of session.     OT Diagnosis:    OT Problem List:   OT Treatment Interventions:     OT Goals Acute Rehab OT Goals Time For Goal Achievement: 02/18/13 Potential to Achieve Goals: Good ADL Goals Pt Will Perform Grooming: with supervision;Sitting, chair ADL Goal: Grooming - Progress: Progressing toward goals Pt Will Perform Upper Body Bathing: with min assist;Sitting, chair Pt Will Perform Lower Body Bathing: with mod assist;Sit to stand from chair;Sit to stand from bed Pt Will Perform Upper Body Dressing: with min assist;Sitting, chair ADL Goal: Upper Body Dressing - Progress: Progressing toward goals Pt Will Perform Lower Body Dressing: with min assist;Sit to stand from chair;Sit to stand from bed ADL Goal: Lower Body Dressing - Progress: Progressing toward goals Pt Will Perform Toileting - Clothing Manipulation: with min assist;Standing Additional ADL Goal #1: Pt will demonstrate intellectual awareness of deficits with min cues ADL Goal: Additional Goal #1 - Progress: Progressing toward goals Additional ADL Goal #2: Pt will require no more than min cues for safety awareness ADL Goal: Additional Goal #2 - Progress: Progressing toward goals  Visit Information  Last OT Received On: 02/05/13 Assistance Needed: +2 PT/OT Co-Evaluation/Treatment: Yes    Subjective Data      Prior Functioning       Cognition  Cognition Arousal/Alertness: Awake/alert Behavior During Therapy: WFL for tasks assessed/performed Overall  Cognitive Status: Impaired/Different from baseline Area of Impairment: Attention;Awareness;Problem solving Current Attention Level: Selective Safety/Judgement: Decreased awareness of deficits;Decreased  awareness of safety Awareness: Intellectual Problem Solving: Decreased initiation;Difficulty sequencing;Requires verbal cues;Requires tactile cues General Comments: Pt less impulsive today, and demonstrates improved awareness of Rt body    Mobility  Transfers Transfers: Sit to Stand;Stand to Sit Sit to Stand: 3: Mod assist;With upper extremity assist;From bed Stand to Sit: 3: Mod assist;To bed;With upper extremity assist Details for Transfer Assistance: Pt leaning to Lt.  Facilitation for equal weighbearing and for normal alignment during transitions    Exercises      Balance Balance Balance Assessed: Yes Static Sitting Balance Static Sitting - Balance Support: No upper extremity supported;Feet supported Static Sitting - Level of Assistance: 5: Stand by assistance Dynamic Sitting Balance Dynamic Sitting - Balance Support: Feet supported Dynamic Sitting - Level of Assistance: 5: Stand by assistance;2: Max assist Dynamic Sitting Balance - Compensations: with grooming activity balance min guard to supervision; however, when pt leaning forward in attempt to don Rt. sock, required min guard assist to max A as he lost balance heavily to Rt. but was able to demonstrate awarenss of balance loss as he initiated a balance reaction and was fearful of trying again Dynamic Sitting - Balance Activities:  (ADL) Static Standing Balance Static Standing - Balance Support: Left upper extremity supported;Right upper extremity supported Static Standing - Level of Assistance: 4: Min assist;3: Mod assist Static Standing - Comment/# of Minutes: Varieable level of assist   End of Session OT - End of Session Activity Tolerance: Patient tolerated treatment well Patient left: in bed;with call bell/phone within reach;with bed alarm set  GO     Chaquita Basques M 02/05/2013, 5:11 PM

## 2013-02-06 LAB — C3 COMPLEMENT: C3 Complement: 167 mg/dL (ref 90–180)

## 2013-02-06 LAB — RPR: RPR Ser Ql: NONREACTIVE

## 2013-02-06 MED ORDER — PNEUMOCOCCAL VAC POLYVALENT 25 MCG/0.5ML IJ INJ
0.5000 mL | INJECTION | INTRAMUSCULAR | Status: AC
  Administered 2013-02-07: 0.5 mL via INTRAMUSCULAR
  Filled 2013-02-06: qty 0.5

## 2013-02-06 NOTE — Progress Notes (Signed)
[facility] PROGRESS NOTE  [name] YNW:[id number] DOB: 1955-12-17 DOA: 02/03/2013 PCP: Benita Stabile, MD  Assessment/Plan: Acute left ACA CVA -2-D echo/carotid Doppler are within normal limits. -On aspirin for secondary stroke prevention. -Smoking cessation will be important in this gentleman. -Hopefully for CIR next week.  Hypertension -Allow permissive hypertension given CVA.  Tobacco abuse -Cessation encouraged.  Alcohol abuse -Thiamine, multivitamin. -Monitor for withdrawals.  DVT prophylaxis -Lovenox.  Code Status: Full code Family Communication: Patient only  Disposition Plan: Possible CIR   Consultants:  Neurology, Dr. Leroy Kennedy   Antibiotics:  None   Subjective: No complaints  Objective: Filed Vitals:   02/05/13 2104 02/06/13 0203 02/06/13 0628 02/06/13 1004  BP: 154/96 134/84 150/90 147/107  Pulse: 82 83 74 86  Temp: 98.8 F (37.1 C) 97.6 F (36.4 C) 97.6 F (36.4 C) 98.4 F (36.9 C)  TempSrc: Oral Oral Oral Oral  Resp: 18 19 18 16   Height:      Weight:      SpO2: 100% 100% 100% 100%   No intake or output data in the 24 hours ending 02/06/13 1312 Filed Weights   02/03/13 1805  Weight: 83.915 kg (185 lb)    Exam:   General:  Alert, awake, oriented x3, in no distress  Cardiovascular: Regular rate and rhythm, no murmurs, rubs or gallops  Respiratory: Clear to auscultation bilaterally  Abdomen: Soft, nontender, nondistended, positive bowel sounds, no masses or organomegaly noted  Extremities: No clubbing, cyanosis or edema, positive pedal pulses.   Neurologic:  Right-sided flaccid paralysis  Data Reviewed: Basic Metabolic Panel:  Recent Labs Lab 02/03/13 1323 02/05/13 0530  NA 139 135  K 4.1 3.4*  CL 102 99  CO2 22 22  GLUCOSE 107* 110*  BUN 18 14  CREATININE 1.17 1.12  CALCIUM 9.8 9.1   Liver Function Tests:  Recent Labs Lab 02/05/13 0530  AST 27  ALT 32  ALKPHOS 118*  BILITOT 0.8  PROT 6.9   ALBUMIN 3.3*   No results found for this basename: LIPASE, AMYLASE,  in the last 168 hours No results found for this basename: AMMONIA,  in the last 168 hours CBC:  Recent Labs Lab 02/03/13 1323  WBC 8.3  NEUTROABS 5.4  HGB 17.2*  HCT 49.4  MCV 83.6  PLT 259   Cardiac Enzymes: No results found for this basename: CKTOTAL, CKMB, CKMBINDEX, TROPONINI,  in the last 168 hours BNP (last 3 results) No results found for this basename: PROBNP,  in the last 8760 hours CBG: No results found for this basename: GLUCAP,  in the last 168 hours  No results found for this or any previous visit (from the past 240 hour(s)).   Studies: No results found.  Scheduled Meds: . aspirin  325 mg Oral Daily  . atorvastatin  10 mg Oral q1800  . enoxaparin (LOVENOX) injection  40 mg Subcutaneous Q24H  . nicotine  14 mg Transdermal Q24H  . [START ON 02/07/2013] pneumococcal 23 valent vaccine  0.5 mL Intramuscular Tomorrow-1000  . thiamine  100 mg Oral Daily   Continuous Infusions:   Active Problems:   CVA (cerebral infarction)   HTN (hypertension)   Tobacco abuse    Time spent: 25 minutes    HERNANDEZ ACOSTA,[name]  [facility] Pager [phone]  If 7PM-7AM, please contact night-coverage at [email or web address], password [email or web address] 02/06/2013, 1:12 PM  LOS: 3 days

## 2013-02-06 NOTE — Progress Notes (Signed)
Stroke Team Progress Note  HISTORY [name] is a 57 y.o. male who noted on Monday night 02/01/13 that he had a sudden onset of right arm and leg weakness (leg >arm) along with difficulty expressing himself. He did not seek medical attention at that time. The patient was brought to ED 02/03/13 due to continued symptoms and was unable to get out of bed. The patient stated he took no medications but that he smoked 5 cigarettes/day.   Date last known well: 5.19.14  Time last known well: 2100  tPA Given: No: out of window  SUBJECTIVE No family members present this morning. The patient has no complaints.  OBJECTIVE Most recent Vital Signs: Filed Vitals:   02/05/13 1747 02/05/13 2104 02/06/13 0203 02/06/13 0628  BP: 159/87 154/96 134/84 150/90  Pulse: 78 82 83 74  Temp: 97.9 F (36.6 C) 98.8 F (37.1 C) 97.6 F (36.4 C) 97.6 F (36.4 C)  TempSrc: Oral Oral Oral Oral  Resp: 18 18 19 18   Height:      Weight:      SpO2: 100% 100% 100% 100%   CBG (last 3)  No results found for this basename: GLUCAP,  in the last 72 hours  IV Fluid Intake:     MEDICATIONS  . aspirin  325 mg Oral Daily  . atorvastatin  10 mg Oral q1800  . enoxaparin (LOVENOX) injection  40 mg Subcutaneous Q24H  . nicotine  14 mg Transdermal Q24H  . thiamine  100 mg Oral Daily   PRN:  acetaminophen, senna-docusate  Diet:  Cardiac thin liquids Activity:  Up with assistance DVT Prophylaxis:  lovenox  CLINICALLY SIGNIFICANT STUDIES Basic Metabolic Panel:   Recent Labs Lab 02/03/13 1323 02/05/13 0530  NA 139 135  K 4.1 3.4*  CL 102 99  CO2 22 22  GLUCOSE 107* 110*  BUN 18 14  CREATININE 1.17 1.12  CALCIUM 9.8 9.1   Liver Function Tests:   Recent Labs Lab 02/05/13 0530  AST 27  ALT 32  ALKPHOS 118*  BILITOT 0.8  PROT 6.9  ALBUMIN 3.3*   CBC:   Recent Labs Lab 02/03/13 1323  WBC 8.3  NEUTROABS 5.4  HGB 17.2*  HCT 49.4  MCV 83.6  PLT 259   Coagulation: No results found for this  basename: LABPROT, INR,  in the last 168 hours Cardiac Enzymes: No results found for this basename: CKTOTAL, CKMB, CKMBINDEX, TROPONINI,  in the last 168 hours Urinalysis:   Recent Labs Lab 02/04/13 1315  COLORURINE YELLOW  LABSPEC 1.036*  PHURINE 5.5  GLUCOSEU NEGATIVE  HGBUR NEGATIVE  BILIRUBINUR SMALL*  KETONESUR NEGATIVE  PROTEINUR NEGATIVE  UROBILINOGEN 0.2  NITRITE NEGATIVE  LEUKOCYTESUR NEGATIVE   Lipid Panel    Component Value Date/Time   CHOL 205* 02/04/2013 0500   TRIG 189* 02/04/2013 0500   HDL 40 02/04/2013 0500   CHOLHDL 5.1 02/04/2013 0500   VLDL 38 02/04/2013 0500   LDLCALC 127* 02/04/2013 0500   Urine Drug Screen:      Component Value Date/Time   LABOPIA NONE DETECTED 02/04/2013 1315    Alcohol Level: No results found for this basename: ETH,  in the last 168 hours  Hypercoagulable Workup Normal - ESR, ANA, Anti III, homocysteine, Protein C total, Protein S total, cardiolipin antibody Prot C activity 147 (high) Lupus anticoagulant -none detected Cardiolipin antibodies -IgG low at 3 - IgM low at 0 - IgA low at 9 Protein S activity -pending HIV - pending RPR -  pending C3 and C4 complements- pending Complement total - pending   Dg Chest 2 View 02/03/2013    No acute abnormality. .   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    MRI/MRA Head Wo Contrast 02/03/2013   MRI HEAD - Acute infarction throughout the left anterior cerebral artery territory.  Mild swelling but no evidence of hemorrhage or mass effect/shift.  Extensive chronic small vessel ischemic changes elsewhere throughout the cerebral hemispheric white matter bilaterally.  MRA HEAD  Abrupt occlusion of the left anterior cerebral artery 8 mm beyond its origin.  Both anterior cerebral arteries receive there supply from the left carotid circulation, the A1 segment on the right being likely aplastic.  Distal vessel atherosclerotic narrowing and  irregularity diffusely.  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 The right vertebral artery is the dominant vessel.     2D Echocardiogram EF 65% , no regional wlal motion abnormalities, no cardiac source of embolism identified.  Carotid Doppler  No evidence of hemodynamically significant internal carotid artery stenosis. Vertebral artery flow is antegrade.   EEG: this is an abnormal awake and drowsy EEG with findings consistent with focal neurological dysfunction involving the left cerebral hemisphere, notably the left temporo-parietal region. Please, be aware that the absence of epileptiform discharges does not exclude the possibility of epilepsy  EKG  normal sinus rhythm.   Therapy Recommendations CIR  Physical Exam   Mental Status:  Alert, oriented to place and year - not oriented to month,  thought content appropriate. Speech dysarthric with evidence of expressive aphasia. Able to follow 3 step commands with some difficulty.  Cranial Nerves:  II: Visual fields grossly normal, pupils equal, round, reactive to light and accommodation  III,IV, VI: ptosis on right present, extra-ocular motions intact bilaterally  V,VII: Face asymmetric on the right, facial light touch sensation normal bilaterally  VIII: hearing normal bilaterally  IX,X: gag reflex present  XI: bilateral shoulder shrug  XII: midline tongue extension  Motor:  Right : Upper extremity - 2-3/5   Left: upper extremity 5/5  Right lower extremity - 0/5    Left lower extremity - 5/5  --unable to abduct right arm, no tricep strength, 4/5 bicep, weak grip and increased tone on right arm  --right leg has increased tone and no movement  Sensory: Pinprick and light touch intact throughout, bilaterally but feel his right leg is decreased  Deep Tendon Reflexes: 2+ left bicep and tricep, 3+right bicep, tricep and brachioradialis, 2+ right KJ and AJ, 1+ left KJ and no AJ and symmetric throughout  Plantars:   Right: downgoing Left: downgoing  Cerebellar:  normal finger-to-nose on left, normal heel-to-shin test on left  ASSESSMENT [name] is a 57 y.o. male presenting with right sided hemiparesis, expressive aphasia. 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 Infarct felt to be embolic, source unknown, workup underway.  On no antithrombotics prior to admission. Now on aspirin 325 mg orally every day for secondary stroke prevention. Patient with resultant right hemiparesis and expressive aphasia.    Hypertension  Hyperlipidemia, LDL 127, not on statin prior to admission, not on statin now  Smoker   HGBa1c 5.7  UDS positive for THC, now associated with stroke  hypercoag panel thus far unrevealing  Hospital day # 3  TREATMENT/PLAN  Continue aspirin 325 mg orally every day for secondary stroke prevention.  Smoking cessation counseling  Risk factor modification  On Lipitor for hyperlipidemia  C3, C4,  CH50, RPR and HIV to complete hypercoag and vasculitic panels - pending TEE to look for embolic source; If positive for PFO (patent foramen ovale), check bilateral lower extremity venous dopplers to rule out DVT as possible source of stroke. Pending (? Date) Outpatient telemetry monitoring to assess patient for atrial fibrillation as source of stroke. arranged with [facility]. Discharge dispo:  CIR  Delton See PA-C [facility] Pager [phone] 02/06/2013, 8:38 AM   I have personally obtained a history, examined the patient, evaluated imaging results, and formulated the assessment and plan of care. I agree with the above. Needs cardioembolic workup with TEE and outpatient cardiac tele. Would be ideal if this can be done before transfer to CIR, which likely won't happen until Tuesday.  Suanne Marker, MD 02/06/2013, 12:14 PM Certified in Neurology, Neurophysiology and Neuroimaging [facility] - Stroke Team  Please refer to  [email or web address] for on-call Stroke MD

## 2013-02-06 NOTE — Progress Notes (Signed)
Pt  More cooperative today, no complaints, assessment unchanged.  Ski [employer] fund[employer]. SCRN

## 2013-02-07 NOTE — Progress Notes (Signed)
Stroke Team Progress Note  HISTORY [name] is a 57 y.o. male who noted on Monday night 02/01/13 that he had a sudden onset of right arm and leg weakness (leg >arm) along with difficulty expressing himself. He did not seek medical attention at that time. The patient was brought to ED 02/03/13 due to continued symptoms and was unable to get out of bed. The patient stated he took no medications but that he smoked 5 cigarettes/day.   Date last known well: 5.19.14  Time last known well: 2100  tPA Given: No: out of window  SUBJECTIVE There are no family members present this morning. The patient has no complaints. He is unable to say whether or not he is improving.  OBJECTIVE Most recent Vital Signs: Filed Vitals:   02/06/13 1333 02/06/13 1814 02/06/13 2134 02/07/13 0548  BP: 152/89 153/92 147/89 136/88  Pulse: 83 85 70 67  Temp: 98.6 F (37 C) 98.6 F (37 C) 98.3 F (36.8 C) 97.9 F (36.6 C)  TempSrc: Oral Oral Oral Oral  Resp: 16 17 20 18   Height:      Weight:      SpO2: 100% 100% 100% 100%   CBG (last 3)  No results found for this basename: GLUCAP,  in the last 72 hours  IV Fluid Intake:     MEDICATIONS  . aspirin  325 mg Oral Daily  . atorvastatin  10 mg Oral q1800  . enoxaparin (LOVENOX) injection  40 mg Subcutaneous Q24H  . nicotine  14 mg Transdermal Q24H  . pneumococcal 23 valent vaccine  0.5 mL Intramuscular Tomorrow-1000  . thiamine  100 mg Oral Daily   PRN:  acetaminophen, senna-docusate  Diet:  Cardiac thin liquids Activity:  Up with assistance DVT Prophylaxis:  lovenox  CLINICALLY SIGNIFICANT STUDIES Basic Metabolic Panel:   Recent Labs Lab 02/03/13 1323 02/05/13 0530  NA 139 135  K 4.1 3.4*  CL 102 99  CO2 22 22  GLUCOSE 107* 110*  BUN 18 14  CREATININE 1.17 1.12  CALCIUM 9.8 9.1   Liver Function Tests:   Recent Labs Lab 02/05/13 0530  AST 27  ALT 32  ALKPHOS 118*  BILITOT 0.8  PROT 6.9  ALBUMIN 3.3*   CBC:   Recent Labs Lab  02/03/13 1323  WBC 8.3  NEUTROABS 5.4  HGB 17.2*  HCT 49.4  MCV 83.6  PLT 259   Coagulation: No results found for this basename: LABPROT, INR,  in the last 168 hours Cardiac Enzymes: No results found for this basename: CKTOTAL, CKMB, CKMBINDEX, TROPONINI,  in the last 168 hours Urinalysis:   Recent Labs Lab 02/04/13 1315  COLORURINE YELLOW  LABSPEC 1.036*  PHURINE 5.5  GLUCOSEU NEGATIVE  HGBUR NEGATIVE  BILIRUBINUR SMALL*  KETONESUR NEGATIVE  PROTEINUR NEGATIVE  UROBILINOGEN 0.2  NITRITE NEGATIVE  LEUKOCYTESUR NEGATIVE   Lipid Panel    Component Value Date/Time   CHOL 205* 02/04/2013 0500   TRIG 189* 02/04/2013 0500   HDL 40 02/04/2013 0500   CHOLHDL 5.1 02/04/2013 0500   VLDL 38 02/04/2013 0500   LDLCALC 127* 02/04/2013 0500   Urine Drug Screen:      Component Value Date/Time   LABOPIA NONE DETECTED 02/04/2013 1315    Alcohol Level: No results found for this basename: ETH,  in the last 168 hours  Hypercoagulable Workup Normal - ESR, ANA, Anti III, homocysteine, Protein C total, Protein S total, cardiolipin antibody Prot C activity 147 (high) Lupus anticoagulant -none detected Cardiolipin  antibodies -IgG low at 3 - IgM low at 0 - IgA low at 9 Protein S activity - 82% within normal limits HIV - non-reactive RPR - non-reactive C3 complement - 167 within normal limits C4 complement - 39 upper limits of normal Complement total - pending   Dg Chest 2 View 02/03/2013    No acute abnormality. .   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    MRI/MRA Head Wo Contrast 02/03/2013    MRI HEAD - Acute infarction throughout the left anterior cerebral artery territory.  Mild swelling but no evidence of hemorrhage or mass effect/shift.  Extensive chronic small vessel ischemic changes elsewhere throughout the cerebral hemispheric white matter bilaterally.   MRA HEAD  Abrupt occlusion of the left anterior cerebral artery 8 mm  beyond its origin.  Both anterior cerebral arteries receive there supply from the left carotid circulation, the A1 segment on the right being likely aplastic.  Distal vessel atherosclerotic narrowing and irregularity diffusely.  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 The right vertebral artery is the dominant vessel.     2D Echocardiogram EF 65% , no regional wlal motion abnormalities, no cardiac source of embolism identified.  Carotid Doppler  No evidence of hemodynamically significant internal carotid artery stenosis. Vertebral artery flow is antegrade.   EEG: this is an abnormal awake and drowsy EEG with findings consistent with focal neurological dysfunction involving the left cerebral hemisphere, notably the left temporo-parietal region. Please, be aware that the absence of epileptiform discharges does not exclude the possibility of epilepsy  EKG  normal sinus rhythm.   Therapy Recommendations CIR  Physical Exam   Mental Status:  Alert, oriented to place and year - not oriented to month,  thought content appropriate. Speech dysarthric with evidence of expressive aphasia. Able to follow simple commands with some difficulty.  Cranial Nerves:  II: Visual fields grossly normal, pupils equal, round, reactive to light and accommodation  III,IV, VI: ptosis on right present, extra-ocular motions intact bilaterally  V,VII: Face asymmetric on the right, facial light touch sensation normal bilaterally  VIII: hearing normal bilaterally  IX,X: gag reflex present  XI: bilateral shoulder shrug  XII: midline tongue extension  Motor:  Right : Upper extremity - 3/5   Left: upper extremity 5/5  Right lower extremity - 0/5    Left lower extremity - 5/5  --unable to abduct right arm, no tricep strength, 4/5 bicep, weak grip and increased tone on right arm  --right leg has increased tone and no movement  Sensory: Pinprick and light touch intact throughout, bilaterally but feel  his right leg is decreased  Deep Tendon Reflexes: 2+ left bicep and tricep, 3+right bicep, tricep and brachioradialis, 2+ right KJ and AJ, 1+ left KJ and no AJ and symmetric throughout  Plantars:  Right: downgoing Left: downgoing  Cerebellar:  normal finger-to-nose on left, normal heel-to-shin test on left  ASSESSMENT [name] is a 57 y.o. male presenting with right sided hemiparesis, expressive aphasia. 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 Infarct felt to be embolic, source unknown, workup underway.  On no antithrombotics prior to admission. Now on aspirin 325 mg orally every day for secondary stroke prevention. Patient with resultant right hemiparesis and expressive aphasia.    Hypertension  Hyperlipidemia, LDL 127, not on statin prior to admission, not on statin now  Smoker   HGBa1c 5.7  UDS positive for THC, now associated with stroke  hypercoag  panel thus far unrevealing  Hospital day # 4  TREATMENT/PLAN  Continue aspirin 325 mg orally every day for secondary stroke prevention.  Smoking cessation counseling  Risk factor modification  On Lipitor for hyperlipidemia  Complement total from vasculitic panel - pending TEE to look for embolic source; If positive for PFO (patent foramen ovale), check bilateral lower extremity venous dopplers to rule out DVT as possible source of stroke. Apparently [facility] has been contacted. Outpatient telemetry monitoring to assess patient for atrial fibrillation as source of stroke. arranged with . Discharge disposition -  CIR  Delton See PA-C [facility] Pager [phone] 02/07/2013, 9:06 AM   I have personally obtained a history, examined the patient, evaluated imaging results, and formulated the assessment and plan of care. I agree with the above. Needs cardioembolic workup with TEE and outpatient cardiac tele. Would be ideal if this can be done before transfer to CIR,  which likely won't happen until Tuesday.  Suanne Marker, MD 02/07/2013, 3:24 PM Certified in Neurology, Neurophysiology and Neuroimaging [facility] - Stroke Team  Please refer to [email or web address] for on-call Stroke MD

## 2013-02-07 NOTE — Progress Notes (Signed)
[facility] PROGRESS NOTE  [name] WUJ:[id number] DOB: 1956-01-04 DOA: 02/03/2013 PCP: Benita Stabile, MD  Assessment/Plan: Acute left ACA CVA -2-D echo/carotid Doppler are within normal limits. -On aspirin for secondary stroke prevention. -Smoking cessation will be important in this gentleman. -TEE arranged by neuro..? When. -Hopefully for CIR next week.  Hypertension -Allow permissive hypertension given CVA.  Tobacco abuse -Cessation encouraged.  Alcohol abuse -Thiamine, multivitamin. -Monitor for withdrawals.  DVT prophylaxis -Lovenox.  Code Status: Full code Family Communication: Patient only  Disposition Plan: Possible CIR   Consultants:  Neurology, Dr. Marjory Lies   Antibiotics:  None   Subjective: No complaints  Objective: Filed Vitals:   02/06/13 1814 02/06/13 2134 02/07/13 0548 02/07/13 1011  BP: 153/92 147/89 136/88 149/79  Pulse: 85 70 67 69  Temp: 98.6 F (37 C) 98.3 F (36.8 C) 97.9 F (36.6 C) 98 F (36.7 C)  TempSrc: Oral Oral Oral Oral  Resp: 17 20 18 16   Height:      Weight:      SpO2: 100% 100% 100% 100%    Intake/Output Summary (Last 24 hours) at 02/07/13 1157 Last data filed at 02/07/13 0700  Gross per 24 hour  Intake      0 ml  Output    600 ml  Net   -600 ml   Filed Weights   02/03/13 1805  Weight: 83.915 kg (185 lb)    Exam:   General:  Alert, awake, oriented x3, in no distress  Cardiovascular: Regular rate and rhythm, no murmurs, rubs or gallops  Respiratory: Clear to auscultation bilaterally  Abdomen: Soft, nontender, nondistended, positive bowel sounds, no masses or organomegaly noted  Extremities: No clubbing, cyanosis or edema, positive pedal pulses.   Neurologic:  Right-sided flaccid paralysis  Data Reviewed: Basic Metabolic Panel:  Recent Labs Lab 02/03/13 1323 02/05/13 0530  NA 139 135  K 4.1 3.4*  CL 102 99  CO2 22 22  GLUCOSE 107* 110*  BUN 18 14  CREATININE 1.17 1.12   CALCIUM 9.8 9.1   Liver Function Tests:  Recent Labs Lab 02/05/13 0530  AST 27  ALT 32  ALKPHOS 118*  BILITOT 0.8  PROT 6.9  ALBUMIN 3.3*   No results found for this basename: LIPASE, AMYLASE,  in the last 168 hours No results found for this basename: AMMONIA,  in the last 168 hours CBC:  Recent Labs Lab 02/03/13 1323  WBC 8.3  NEUTROABS 5.4  HGB 17.2*  HCT 49.4  MCV 83.6  PLT 259   Cardiac Enzymes: No results found for this basename: CKTOTAL, CKMB, CKMBINDEX, TROPONINI,  in the last 168 hours BNP (last 3 results) No results found for this basename: PROBNP,  in the last 8760 hours CBG: No results found for this basename: GLUCAP,  in the last 168 hours  No results found for this or any previous visit (from the past 240 hour(s)).   Studies: No results found.  Scheduled Meds: . aspirin  325 mg Oral Daily  . atorvastatin  10 mg Oral q1800  . enoxaparin (LOVENOX) injection  40 mg Subcutaneous Q24H  . nicotine  14 mg Transdermal Q24H  . thiamine  100 mg Oral Daily   Continuous Infusions:   Active Problems:   CVA (cerebral infarction)   HTN (hypertension)   Tobacco abuse    Time spent: 25 minutes    HERNANDEZ ACOSTA,[name]  [facility] Pager [phone]  If 7PM-7AM, please contact night-coverage at [email or web address], password [facility] 02/07/2013, 11:57 AM  LOS: 4 days

## 2013-02-07 NOTE — Plan of Care (Signed)
Pt resting in bed with no complaints. Eating dinner. Denies pain at this time. Call bell within reach and func. Safety maintained. Will cont to monitor pt.

## 2013-02-08 NOTE — Progress Notes (Signed)
Physical Therapy Treatment Patient Details Name: [name] MRN: [id number] DOB: 07/25/1956 Today's Date: 02/08/2013 Time: 4098-1191 PT Time Calculation (min): 25 min  PT Assessment / Plan / Recommendation Comments on Treatment Session  Emphasis on dyn sitting and standing tasks with facilitation of w/shift and gait between each task. Pt shows some increased awareness of R side, but initiation and transitions between Left to Right side are difficult.  Pt will be a great rehab candidate.    Follow Up Recommendations  CIR     Does the patient have the potential to tolerate intense rehabilitation     Barriers to Discharge        Equipment Recommendations   (TBD)    Recommendations for Other Services    Frequency Min 4X/week   Plan      Precautions / Restrictions Precautions Precautions: Fall Precaution Comments: Pt impulsive with poor awareness of deficits; Rt. neglect Restrictions Weight Bearing Restrictions: No   Pertinent Vitals/Pain     Mobility  Bed Mobility Bed Mobility: Sit to Supine Supine to Sit: 4: Min guard;With rails;HOB flat Sitting - Scoot to Edge of Bed: 4: Min assist Sit to Supine: 3: Mod assist;HOB flat;Other (comment) (with R LE only) Details for Bed Mobility Assistance: Pt continues to be heavily reliant on Lt side to do the work.  Requires cues and facilitation for Rt. side Transfers Transfers: Sit to Stand;Stand to Sit Sit to Stand: 3: Mod assist;With upper extremity assist;From bed Sit to Stand: Patient Percentage: 60% Stand to Sit: 3: Mod assist;To bed;With upper extremity assist Stand to Sit: Patient Percentage: 60% Details for Transfer Assistance: Pt continue to lean R if not facilitated into symmetry.  Assist to come forward over BOS Ambulation/Gait Ambulation/Gait Assistance: 1: +2 Total assist Ambulation/Gait: Patient Percentage: 40% Ambulation Distance (Feet): 14 Feet (x2 to/from the bathroom) Assistive device: 2 person hand held  assist Ambulation/Gait Assistance Details: facilitation for w/shift and support as R LE is assisted forward with max assist.  Also trunk needed translation forward otherwise R side remained rotated behind theL side. Gait Pattern: Right circumduction;Step-to pattern;Decreased step length - right;Decreased stance time - right;Decreased hip/knee flexion - right;Ataxic;Narrow base of support Stairs: No Modified Rankin (Stroke Patients Only) Modified Rankin: Moderately severe disability    Exercises     PT Diagnosis:    PT Problem List:   PT Treatment Interventions:     PT Goals Acute Rehab PT Goals Time For Goal Achievement: 02/18/13 Potential to Achieve Goals: Fair Pt will Roll Supine to Right Side: with supervision PT Goal: Rolling Supine to Right Side - Progress: Progressing toward goal Pt will go Supine/Side to Sit: with supervision;with HOB 0 degrees PT Goal: Supine/Side to Sit - Progress: Progressing toward goal Pt will go Sit to Stand: with min assist PT Goal: Sit to Stand - Progress: Progressing toward goal Pt will Ambulate: 16 - 50 feet;with least restrictive assistive device;with mod assist PT Goal: Ambulate - Progress: Not met Additional Goals Additional Goal #1: pregait activity at EOB with mod assist PT Goal: Additional Goal #1 - Progress: Progressing toward goal  Visit Information  Last PT Received On: 02/08/13 Assistance Needed: +2    Subjective Data  Subjective: I'm tired...   Cognition  Cognition Arousal/Alertness: Awake/alert Behavior During Therapy: WFL for tasks assessed/performed Overall Cognitive Status: Impaired/Different from baseline Area of Impairment: Attention;Awareness;Problem solving Current Attention Level: Selective Safety/Judgement: Decreased awareness of deficits;Decreased awareness of safety Awareness: Intellectual Problem Solving: Decreased initiation;Difficulty sequencing;Requires verbal cues;Requires tactile cues General  Comments: pt's  impulsivity continues, though remains less and manageable if he is cued.  Communication between L and R brain remains hampered.  Neglect remains significant, with pt appearing to use his mouth interchangeably with his R hand or as a go between to help the transition to initiating with the R Ue    Balance  Balance Balance Assessed: Yes Static Sitting Balance Static Sitting - Balance Support: No upper extremity supported;Feet supported Static Sitting - Level of Assistance: 5: Stand by assistance Static Sitting - Comment/# of Minutes: 5+ min Dynamic Sitting Balance Dynamic Sitting - Balance Support: Feet supported Dynamic Sitting - Level of Assistance: 5: Stand by assistance Dynamic Sitting Balance - Compensations: brushing hair with supervision for balance Static Standing Balance Static Standing - Balance Support: No upper extremity supported Static Standing - Level of Assistance: 4: Min assist Dynamic Standing Balance Dynamic Standing - Balance Support: Left upper extremity supported;Right upper extremity supported;No upper extremity supported;During functional activity Dynamic Standing - Level of Assistance: 1: +2 Total assist Dynamic Standing - Balance Activities: Lateral lean/weight shifting;Forward lean/weight shifting;Reaching across midline;Other (comment) (brushing teeth, biased task to use R UE) Dynamic Standing - Comments: Still leans heavily R unless facil. into midline.  End of Session PT - End of Session Activity Tolerance: Patient tolerated treatment well;Patient limited by fatigue Patient left: in bed;with call bell/phone within reach Nurse Communication: Mobility status   GP     Perla Echavarria, Eliseo Gum 02/08/2013, 12:07 PM 02/08/2013  Brandon Bing, PT [phone]  (pager)

## 2013-02-08 NOTE — Progress Notes (Signed)
Occupational Therapy Treatment Patient Details Name: [name] MRN: [id number] DOB: November 15, 1955 Today's Date: 02/08/2013 Time: 3086-5784 OT Time Calculation (min): 34 min  OT Assessment / Plan / Recommendation Comments on Treatment Session  Pt with improved awareness of deficits.  He continues to be impulsive with functional mobility placing him at high risk for falls. Continues to neglect Rt. Side, but is beginning to self correct and difficulty noted crossing midline and using bil. UEs to perform task    Follow Up Recommendations  CIR;Supervision/Assistance - 24 hour    Barriers to Discharge       Equipment Recommendations  None recommended by OT    Recommendations for Other Services    Frequency Min 2X/week   Plan Discharge plan remains appropriate    Precautions / Restrictions Precautions Precautions: Fall Precaution Comments: Pt impulsive with poor awareness of deficits; Rt. neglect Restrictions Weight Bearing Restrictions: No   Pertinent Vitals/Pain     ADL  Grooming: Minimal assistance;Other (comment) (mod verbal cues) Where Assessed - Grooming: Unsupported standing Toilet Transfer: +2 Total assistance Toilet Transfer: Patient Percentage: 50% Toilet Transfer Method: Sit to stand;Stand pivot Toilet Transfer Equipment: Comfort height toilet;Grab bars Toileting - Clothing Manipulation and Hygiene: +2 Total assistance Toileting - Clothing Manipulation and Hygiene: Patient Percentage: 50% Where Assessed - Toileting Clothing Manipulation and Hygiene: Standing Transfers/Ambulation Related to ADLs: Pt ambulated to BR with +2 total A (pt ~50-60%).  Pt requires assist/facilitation to weighshift to Rt and to advance Rt. LE ADL Comments: While EOB, worked on reaching Lt and Rt, crossing midline during reach with Rt UE - requires min facilitation and increased time, and handing object from Rt hand to Lt. hand.  Pt noted to have significant difficulty crossing midline and with  bil tasks.  Difficutly realeasing object and when attempting to place object in Rt. hand, he will place it in his mouth several times before finally placing it in his Rt hand.  Pt is aware of the error, and is able to correct with min verbal cues and several attempts.  While brushing teeth, requires mod verbal cues to use bil. hands to replace and remove top on toothpaste.  pt brushed teeth with Rt. UE for ~75% of task. Difficulty sequencing and timing rinsing and spitting of water.    OT Diagnosis:    OT Problem List:   OT Treatment Interventions:     OT Goals Acute Rehab OT Goals Time For Goal Achievement: 02/18/13 Potential to Achieve Goals: Good ADL Goals Pt Will Perform Grooming: with supervision;Sitting, chair ADL Goal: Grooming - Progress: Progressing toward goals Pt Will Perform Upper Body Bathing: with min assist;Sitting, chair Pt Will Perform Lower Body Bathing: with mod assist;Sit to stand from chair;Sit to stand from bed Pt Will Perform Upper Body Dressing: with min assist;Sitting, chair Pt Will Perform Lower Body Dressing: with min assist;Sit to stand from chair;Sit to stand from bed Pt Will Perform Toileting - Clothing Manipulation: with min assist;Standing ADL Goal: Toileting - Clothing Manipulation - Progress: Progressing toward goals Additional ADL Goal #1: Pt will demonstrate intellectual awareness of deficits with min cues ADL Goal: Additional Goal #1 - Progress: Progressing toward goals Additional ADL Goal #2: Pt will require no more than min cues for safety awareness ADL Goal: Additional Goal #2 - Progress: Progressing toward goals  Visit Information  Last OT Received On: 02/08/13 Assistance Needed: +2 PT/OT Co-Evaluation/Treatment: Yes    Subjective Data      Prior Functioning  Cognition  Cognition Arousal/Alertness: Awake/alert Behavior During Therapy: WFL for tasks assessed/performed Overall Cognitive Status: Impaired/Different from baseline Area  of Impairment: Attention;Safety/judgement;Awareness;Problem solving Current Attention Level: Selective Safety/Judgement: Decreased awareness of safety Awareness: Intellectual Problem Solving: Slow processing;Requires verbal cues;Requires tactile cues;Difficulty sequencing;Decreased initiation General Comments: see ADL comments.    Mobility  Bed Mobility Bed Mobility: Supine to Sit;Sitting - Scoot to Edge of Bed;Sit to Supine Supine to Sit: 4: Min guard;With rails;HOB flat Sitting - Scoot to Edge of Bed: 4: Min assist Sit to Supine: 4: Min assist;With rail Details for Bed Mobility Assistance: Pt continues to be heavily reliant on Lt side to do the work.  Requires cues and facilitation for Rt. side Transfers Transfers: Sit to Stand;Stand to Sit Sit to Stand: 4: Min assist;With upper extremity assist;From bed;From toilet Stand to Sit: 4: Min assist;Without upper extremity assist;To bed;To toilet Details for Transfer Assistance: Assist for safety, leans to Rt. and can't correct it placing him at risk for fall    Exercises      Balance Balance Balance Assessed: Yes Static Sitting Balance Static Sitting - Balance Support: No upper extremity supported;Feet supported Static Sitting - Level of Assistance: 5: Stand by assistance Dynamic Sitting Balance Dynamic Sitting - Balance Support: Feet supported Dynamic Sitting - Level of Assistance: 5: Stand by assistance Dynamic Sitting Balance - Compensations: brushing hair with supervision for balance Static Standing Balance Static Standing - Balance Support: No upper extremity supported Static Standing - Level of Assistance: 4: Min assist Dynamic Standing Balance Dynamic Standing - Balance Support: Left upper extremity supported;During functional activity Dynamic Standing - Level of Assistance: 4: Min assist Dynamic Standing - Balance Activities: Other (comment) (grooming at sink)   End of Session OT - End of Session Activity Tolerance:  Patient tolerated treatment well Patient left: in bed;with call bell/phone within reach;with bed alarm set  GO     [name] 02/08/2013, 12:03 PM

## 2013-02-08 NOTE — Progress Notes (Signed)
[facility] PROGRESS NOTE  [name] WUJ:[id number] DOB: Jun 26, 1956 DOA: 02/03/2013 PCP: Benita Stabile, MD  Assessment/Plan: Acute left ACA CVA -2-D echo/carotid Doppler are within normal limits. -On aspirin for secondary stroke prevention. -Smoking cessation will be important in this gentleman. -TEE arranged by neuro..? When. -Hopefully for CIR next week.  Hypertension -Allow permissive hypertension given CVA.  Tobacco abuse -Cessation encouraged.  Alcohol abuse -Thiamine, multivitamin. -Monitor for withdrawals.  DVT prophylaxis -Lovenox.  Code Status: Full code Family Communication: Patient only  Disposition Plan: Awaiting insurance approval for CIR.   Consultants:  Neurology, Dr. Marjory Lies   Antibiotics:  None   Subjective: No complaints  Objective: Filed Vitals:   02/07/13 2100 02/08/13 0100 02/08/13 0602 02/08/13 0943  BP: 170/100 156/92 156/87 153/93  Pulse:  71 67 78  Temp:  98.1 F (36.7 C) 98.2 F (36.8 C) 97.5 F (36.4 C)  TempSrc:  Oral Oral Oral  Resp:  20 18 16   Height:      Weight:      SpO2:  100% 100% 100%    Intake/Output Summary (Last 24 hours) at 02/08/13 1326 Last data filed at 02/08/13 0800  Gross per 24 hour  Intake    360 ml  Output    250 ml  Net    110 ml   Filed Weights   02/03/13 1805  Weight: 83.915 kg (185 lb)    Exam:   General:  Alert, awake, oriented x3, in no distress  Cardiovascular: Regular rate and rhythm, no murmurs, rubs or gallops  Respiratory: Clear to auscultation bilaterally  Abdomen: Soft, nontender, nondistended, positive bowel sounds, no masses or organomegaly noted  Extremities: No clubbing, cyanosis or edema, positive pedal pulses.   Neurologic:  Right-sided flaccid paralysis  Data Reviewed: Basic Metabolic Panel:  Recent Labs Lab 02/03/13 1323 02/05/13 0530  NA 139 135  K 4.1 3.4*  CL 102 99  CO2 22 22  GLUCOSE 107* 110*  BUN 18 14  CREATININE 1.17 1.12   CALCIUM 9.8 9.1   Liver Function Tests:  Recent Labs Lab 02/05/13 0530  AST 27  ALT 32  ALKPHOS 118*  BILITOT 0.8  PROT 6.9  ALBUMIN 3.3*   No results found for this basename: LIPASE, AMYLASE,  in the last 168 hours No results found for this basename: AMMONIA,  in the last 168 hours CBC:  Recent Labs Lab 02/03/13 1323  WBC 8.3  NEUTROABS 5.4  HGB 17.2*  HCT 49.4  MCV 83.6  PLT 259   Cardiac Enzymes: No results found for this basename: CKTOTAL, CKMB, CKMBINDEX, TROPONINI,  in the last 168 hours BNP (last 3 results) No results found for this basename: PROBNP,  in the last 8760 hours CBG: No results found for this basename: GLUCAP,  in the last 168 hours  No results found for this or any previous visit (from the past 240 hour(s)).   Studies: No results found.  Scheduled Meds: . aspirin  325 mg Oral Daily  . atorvastatin  10 mg Oral q1800  . enoxaparin (LOVENOX) injection  40 mg Subcutaneous Q24H  . nicotine  14 mg Transdermal Q24H  . thiamine  100 mg Oral Daily   Continuous Infusions:   Active Problems:   CVA (cerebral infarction)   HTN (hypertension)   Tobacco abuse    Time spent: 25 minutes    HERNANDEZ ACOSTA,[name]  [facility] Pager [phone]  If 7PM-7AM, please contact night-coverage at [email or web address], password [facility] 02/08/2013, 1:26 PM  LOS:  5 days

## 2013-02-08 NOTE — CV Procedure (Addendum)
Met with pt at bedside and then contacted his wife by phone to discuss inpt rehab venue as an option for his rehab. She is in agreement. Noted plans pending for TEE to complete his medical workup. [facility] is closed today therefore inpt rehab admission is unable to be arrange. I discussed with Dr. Ardyth Harps. I will follow up in the morning.[phone] Wife would like to be contacted by phone to discuss his care. Her number is [phone]

## 2013-02-08 NOTE — Progress Notes (Signed)
Stroke Team Progress Note  HISTORY [name] is a 57 y.o. male who noted on Monday night 02/01/13 that he had a sudden onset of right arm and leg weakness (leg >arm) along with difficulty expressing himself. He did not seek medical attention at that time. The patient was brought to ED 02/03/13 due to continued symptoms and was unable to get out of bed. The patient stated he took no medications but that he smoked 5 cigarettes/day.   Date last known well: 5.19.14  Time last known well: 2100  tPA Given: No: out of window  SUBJECTIVE There are no family members present this morning. He remains waiting on TEE.  OBJECTIVE Most recent Vital Signs: Filed Vitals:   02/07/13 2100 02/08/13 0100 02/08/13 0602 02/08/13 0943  BP: 170/100 156/92 156/87 153/93  Pulse:  71 67 78  Temp:  98.1 F (36.7 C) 98.2 F (36.8 C) 97.5 F (36.4 C)  TempSrc:  Oral Oral Oral  Resp:  20 18 16   Height:      Weight:      SpO2:  100% 100% 100%   CBG (last 3)  No results found for this basename: GLUCAP,  in the last 72 hours  IV Fluid Intake:     MEDICATIONS  . aspirin  325 mg Oral Daily  . atorvastatin  10 mg Oral q1800  . enoxaparin (LOVENOX) injection  40 mg Subcutaneous Q24H  . nicotine  14 mg Transdermal Q24H  . thiamine  100 mg Oral Daily   PRN:  acetaminophen, senna-docusate  Diet:  Cardiac thin liquids Activity:  Up with assistance DVT Prophylaxis:  lovenox  CLINICALLY SIGNIFICANT STUDIES Basic Metabolic Panel:   Recent Labs Lab 02/03/13 1323 02/05/13 0530  NA 139 135  K 4.1 3.4*  CL 102 99  CO2 22 22  GLUCOSE 107* 110*  BUN 18 14  CREATININE 1.17 1.12  CALCIUM 9.8 9.1   Liver Function Tests:   Recent Labs Lab 02/05/13 0530  AST 27  ALT 32  ALKPHOS 118*  BILITOT 0.8  PROT 6.9  ALBUMIN 3.3*   CBC:   Recent Labs Lab 02/03/13 1323  WBC 8.3  NEUTROABS 5.4  HGB 17.2*  HCT 49.4  MCV 83.6  PLT 259   Coagulation: No results found for this basename: LABPROT, INR,  in  the last 168 hours Cardiac Enzymes: No results found for this basename: CKTOTAL, CKMB, CKMBINDEX, TROPONINI,  in the last 168 hours Urinalysis:   Recent Labs Lab 02/04/13 1315  COLORURINE YELLOW  LABSPEC 1.036*  PHURINE 5.5  GLUCOSEU NEGATIVE  HGBUR NEGATIVE  BILIRUBINUR SMALL*  KETONESUR NEGATIVE  PROTEINUR NEGATIVE  UROBILINOGEN 0.2  NITRITE NEGATIVE  LEUKOCYTESUR NEGATIVE   Lipid Panel    Component Value Date/Time   CHOL 205* 02/04/2013 0500   TRIG 189* 02/04/2013 0500   HDL 40 02/04/2013 0500   CHOLHDL 5.1 02/04/2013 0500   VLDL 38 02/04/2013 0500   LDLCALC 127* 02/04/2013 0500   Urine Drug Screen:      Component Value Date/Time   LABOPIA NONE DETECTED 02/04/2013 1315    Alcohol Level: No results found for this basename: ETH,  in the last 168 hours  Hypercoagulable Workup Normal - ESR, ANA, Anti III, homocysteine, Protein C total, Protein S total, cardiolipin antibody Prot C activity 147 (high) Lupus anticoagulant -none detected Cardiolipin antibodies -IgG low at 3 - IgM low at 0 - IgA low at 9 Protein S activity - 82% within normal limits HIV -  non-reactive RPR - non-reactive C3 complement - 167 within normal limits C4 complement - 39 upper limits of normal Complement total - pending  Dg Chest 2 View 02/03/2013    No acute abnormality. .   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    MRI/MRA Head Wo Contrast 02/03/2013    MRI HEAD - Acute infarction throughout the left anterior cerebral artery territory.  Mild swelling but no evidence of hemorrhage or mass effect/shift.  Extensive chronic small vessel ischemic changes elsewhere throughout the cerebral hemispheric white matter bilaterally.   MRA HEAD  Abrupt occlusion of the left anterior cerebral artery 8 mm beyond its origin.  Both anterior cerebral arteries receive there supply from the left carotid circulation, the A1 segment on the right being likely aplastic.   Distal vessel atherosclerotic narrowing and irregularity diffusely.  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 The right vertebral artery is the dominant vessel.     2D Echocardiogram EF 65% , no regional wlal motion abnormalities, no cardiac source of embolism identified.  Carotid Doppler  No evidence of hemodynamically significant internal carotid artery stenosis. Vertebral artery flow is antegrade.   EEG: this is an abnormal awake and drowsy EEG with findings consistent with focal neurological dysfunction involving the left cerebral hemisphere, notably the left temporo-parietal region. Please, be aware that the absence of epileptiform discharges does not exclude the possibility of epilepsy  EKG  normal sinus rhythm.   Therapy Recommendations CIR  Physical Exam   Mental Status:  Alert, oriented to place and year - not oriented to month,  thought content appropriate. Speech dysarthric with mild evidence of expressive aphasia. Able to follow simple commands with some difficulty.  Cranial Nerves:  II: Visual fields grossly normal, pupils equal, round, reactive to light and accommodation  III,IV, VI: ptosis on right present, extra-ocular motions intact bilaterally  V,VII: Face asymmetric on the right, facial light touch sensation normal bilaterally  VIII: hearing normal bilaterally  IX,X: gag reflex present  XI: bilateral shoulder shrug  XII: midline tongue extension  Motor:  Right : Upper extremity - 2-3/5   Left: upper extremity 5/5  Right lower extremity - 0/5    Left lower extremity - 5/5  --unable to abduct right arm, no tricep strength, 4/5 bicep, weak grip and increased tone on right arm  --right leg has increased tone and no movement  Sensory: Pinprick and light touch intact throughout, bilaterally but feel his right leg is decreased  Deep Tendon Reflexes: 2+ left bicep and tricep, 3+right bicep, tricep and brachioradialis, 2+ right KJ and AJ, 1+ left KJ  and no AJ and symmetric throughout  Plantars:  Right: downgoing Left: downgoing  Cerebellar:  normal finger-to-nose on left, normal heel-to-shin test on left  ASSESSMENT [name] is a 57 y.o. male presenting with right sided hemiparesis, expressive aphasia. 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 Infarct felt to be embolic, source unknown, workup underway.  On no antithrombotics prior to admission. Now on aspirin 325 mg orally every day for secondary stroke prevention. Patient with resultant right hemiparesis and mild expressive aphasia.    Hypertension  Hyperlipidemia, LDL 127, not on statin prior to admission, On Lipitor now  Smoker   HGBa1c 5.7  UDS positive for THC, now associated with stroke  hypercoag panel thus far unrevealing  Hospital day # 5  TREATMENT/PLAN  Continue aspirin 325 mg orally every day for secondary stroke prevention.  Smoking  cessation counseling  Risk factor modification TEE to look for embolic source; If positive for PFO (patent foramen ovale), check bilateral lower extremity venous dopplers to rule out DVT as possible source of stroke.  [facility] was contacted last week by Guy Franco with tentative scheduling on 5/23. Outpatient telemetry monitoring to assess patient for atrial fibrillation as source of stroke. arranged with [facility]. Discharge disposition -  CIR once TEE completed  Annie Main, MSN, RN, ANVP-BC, ANP-BC, GNP-BC [facility] Pager: [phone] 02/08/2013 11:08 AM   I have personally obtained a history, examined the patient, evaluated imaging results, and formulated the assessment and plan of care. I agree with the above. TEE then CIR. Discussed with Dr. Ardyth Harps.  Suanne Marker, MD 02/08/2013, 11:06 AM Certified in Neurology, Neurophysiology and Neuroimaging [facility] - Stroke Team  Please refer to [email or web address] for on-call Stroke MD

## 2013-02-09 ENCOUNTER — Encounter (HOSPITAL_COMMUNITY): Payer: Self-pay | Admitting: Cardiology

## 2013-02-09 ENCOUNTER — Inpatient Hospital Stay (HOSPITAL_COMMUNITY)
Admission: RE | Admit: 2013-02-09 | Discharge: 2013-02-25 | DRG: 945 | Disposition: A | Discharge status: Home / Self Care | Payer: 59 | Source: Intra-hospital | Attending: Physical Medicine & Rehabilitation | Admitting: Physical Medicine & Rehabilitation

## 2013-02-09 ENCOUNTER — Encounter (HOSPITAL_COMMUNITY)
Admission: EM | Disposition: A | Discharge status: Rehabilitation Facility | Payer: Self-pay | Source: Home / Self Care | Attending: Internal Medicine

## 2013-02-09 DIAGNOSIS — K5900 Constipation, unspecified: Secondary | ICD-10-CM

## 2013-02-09 DIAGNOSIS — I6340 Cerebral infarction due to embolism of unspecified cerebral artery: Secondary | ICD-10-CM

## 2013-02-09 DIAGNOSIS — Z79899 Other long term (current) drug therapy: Secondary | ICD-10-CM

## 2013-02-09 DIAGNOSIS — F323 Major depressive disorder, single episode, severe with psychotic features: Secondary | ICD-10-CM

## 2013-02-09 DIAGNOSIS — I10 Essential (primary) hypertension: Secondary | ICD-10-CM

## 2013-02-09 DIAGNOSIS — I6789 Other cerebrovascular disease: Secondary | ICD-10-CM

## 2013-02-09 DIAGNOSIS — E785 Hyperlipidemia, unspecified: Secondary | ICD-10-CM

## 2013-02-09 DIAGNOSIS — Z5189 Encounter for other specified aftercare: Principal | ICD-10-CM

## 2013-02-09 DIAGNOSIS — R4701 Aphasia: Secondary | ICD-10-CM

## 2013-02-09 DIAGNOSIS — I639 Cerebral infarction, unspecified: Secondary | ICD-10-CM

## 2013-02-09 DIAGNOSIS — F17200 Nicotine dependence, unspecified, uncomplicated: Secondary | ICD-10-CM

## 2013-02-09 DIAGNOSIS — F1210 Cannabis abuse, uncomplicated: Secondary | ICD-10-CM

## 2013-02-09 DIAGNOSIS — R29898 Other symptoms and signs involving the musculoskeletal system: Secondary | ICD-10-CM

## 2013-02-09 HISTORY — PX: TEE WITHOUT CARDIOVERSION: SHX5443

## 2013-02-09 LAB — CBC
HCT: 42.1 % (ref 39.0–52.0)
Hemoglobin: 14.5 g/dL (ref 13.0–17.0)
MCH: 28.7 pg (ref 26.0–34.0)
MCHC: 34.4 g/dL (ref 30.0–36.0)
MCV: 83.4 fL (ref 78.0–100.0)
Platelets: 276 K/uL (ref 150–400)
RBC: 5.05 MIL/uL (ref 4.22–5.81)
RDW: 13.5 % (ref 11.5–15.5)
WBC: 10.1 K/uL (ref 4.0–10.5)

## 2013-02-09 LAB — CREATININE, SERUM: GFR calc Af Amer: 86 mL/min — ABNORMAL LOW (ref >90)

## 2013-02-09 SURGERY — ECHOCARDIOGRAM, TRANSESOPHAGEAL
Anesthesia: Moderate Sedation

## 2013-02-09 MED ORDER — ONDANSETRON HCL 4 MG PO TABS: 4.0000 mg | ORAL_TABLET | Freq: Four times a day (QID) | ORAL | Status: DC | PRN

## 2013-02-09 MED ORDER — ATORVASTATIN CALCIUM 10 MG PO TABS
10.0000 mg | ORAL_TABLET | Freq: Every day | ORAL | Status: DC
  Administered 2013-02-10 – 2013-02-24 (×15): 10 mg via ORAL
  Filled 2013-02-09 (×16): qty 1

## 2013-02-09 MED ORDER — ACETAMINOPHEN 325 MG PO TABS: 650.0000 mg | ORAL_TABLET | ORAL | Status: DC | PRN

## 2013-02-09 MED ORDER — BUTAMBEN-TETRACAINE-BENZOCAINE 2-2-14 % EX AERO
INHALATION_SPRAY | CUTANEOUS | Status: DC | PRN
  Administered 2013-02-09: 2 via TOPICAL

## 2013-02-09 MED ORDER — SENNOSIDES-DOCUSATE SODIUM 8.6-50 MG PO TABS
1.0000 | ORAL_TABLET | Freq: Every evening | ORAL | Status: DC | PRN
  Administered 2013-02-10 – 2013-02-11 (×2): 1 via ORAL
  Filled 2013-02-09: qty 1
  Filled 2013-02-09: qty 2
  Filled 2013-02-09 (×2): qty 1
  Filled 2013-02-09: qty 2
  Filled 2013-02-09 (×2): qty 1

## 2013-02-09 MED ORDER — FENTANYL CITRATE 0.05 MG/ML IJ SOLN
INTRAMUSCULAR | Status: DC | PRN
  Administered 2013-02-09 (×2): 25 ug via INTRAVENOUS

## 2013-02-09 MED ORDER — SORBITOL 70 % SOLN
30.0000 mL | Freq: Every day | Status: DC | PRN
  Filled 2013-02-09: qty 30

## 2013-02-09 MED ORDER — MIDAZOLAM HCL 10 MG/2ML IJ SOLN
INTRAMUSCULAR | Status: DC | PRN
  Administered 2013-02-09 (×2): 1 mg via INTRAVENOUS
  Administered 2013-02-09: 2 mg via INTRAVENOUS

## 2013-02-09 MED ORDER — VITAMIN B-1 100 MG PO TABS
100.0000 mg | ORAL_TABLET | Freq: Every day | ORAL | Status: DC
  Administered 2013-02-10 – 2013-02-25 (×16): 100 mg via ORAL
  Filled 2013-02-09 (×17): qty 1

## 2013-02-09 MED ORDER — ONDANSETRON HCL 4 MG/2ML IJ SOLN: 4.0000 mg | Freq: Four times a day (QID) | INTRAMUSCULAR | Status: DC | PRN

## 2013-02-09 MED ORDER — SODIUM CHLORIDE 0.9 % IV SOLN: INTRAVENOUS | Status: DC

## 2013-02-09 MED ORDER — ASPIRIN 325 MG PO TABS
325.0000 mg | ORAL_TABLET | Freq: Every day | ORAL | Status: DC
  Administered 2013-02-10 – 2013-02-25 (×16): 325 mg via ORAL
  Filled 2013-02-09 (×17): qty 1

## 2013-02-09 MED ORDER — ENOXAPARIN SODIUM 40 MG/0.4ML ~~LOC~~ SOLN: 40.0000 mg | SUBCUTANEOUS | Status: DC

## 2013-02-09 MED ORDER — NICOTINE 14 MG/24HR TD PT24
14.0000 mg | MEDICATED_PATCH | TRANSDERMAL | Status: DC
  Administered 2013-02-09 – 2013-02-24 (×16): 14 mg via TRANSDERMAL
  Filled 2013-02-09 (×17): qty 1

## 2013-02-09 MED ORDER — ENOXAPARIN SODIUM 40 MG/0.4ML ~~LOC~~ SOLN
40.0000 mg | SUBCUTANEOUS | Status: DC
  Administered 2013-02-10 – 2013-02-25 (×16): 40 mg via SUBCUTANEOUS
  Filled 2013-02-09 (×17): qty 0.4

## 2013-02-09 NOTE — Progress Notes (Signed)
Stroke Team Progress Note  HISTORY [name] is a 57 y.o. male who noted on Monday night 02/01/13 that he had a sudden onset of right arm and leg weakness (leg >arm) along with difficulty expressing himself. He did not seek medical attention at that time. The patient was brought to ED 02/03/13 due to continued symptoms and was unable to get out of bed. The patient stated he took no medications but that he smoked 5 cigarettes/day.   Date last known well: 5.19.14  Time last known well: 2100  tPA Given: No: out of window  SUBJECTIVE Patient in endo recovery, just completed TEE.  OBJECTIVE Most recent Vital Signs: Filed Vitals:   02/09/13 1210 02/09/13 1215 02/09/13 1220 02/09/13 1230  BP: 150/107 128/77 117/75   Pulse:      Temp:    97.9 F (36.6 C)  TempSrc:    Oral  Resp: 15 18 27    Height:      Weight:      SpO2: 100% 100% 100%    CBG (last 3)  No results found for this basename: GLUCAP,  in the last 72 hours  IV Fluid Intake:   . sodium chloride      MEDICATIONS  . aspirin  325 mg Oral Daily  . atorvastatin  10 mg Oral q1800  . enoxaparin (LOVENOX) injection  40 mg Subcutaneous Q24H  . nicotine  14 mg Transdermal Q24H  . thiamine  100 mg Oral Daily   PRN:  acetaminophen, senna-docusate  Diet:  NPO  Activity:  Up with assistance DVT Prophylaxis:  lovenox  CLINICALLY SIGNIFICANT STUDIES Basic Metabolic Panel:   Recent Labs Lab 02/03/13 1323 02/05/13 0530  NA 139 135  K 4.1 3.4*  CL 102 99  CO2 22 22  GLUCOSE 107* 110*  BUN 18 14  CREATININE 1.17 1.12  CALCIUM 9.8 9.1   Liver Function Tests:   Recent Labs Lab 02/05/13 0530  AST 27  ALT 32  ALKPHOS 118*  BILITOT 0.8  PROT 6.9  ALBUMIN 3.3*   CBC:   Recent Labs Lab 02/03/13 1323  WBC 8.3  NEUTROABS 5.4  HGB 17.2*  HCT 49.4  MCV 83.6  PLT 259   Coagulation: No results found for this basename: LABPROT, INR,  in the last 168 hours Cardiac Enzymes: No results found for this basename:  CKTOTAL, CKMB, CKMBINDEX, TROPONINI,  in the last 168 hours Urinalysis:   Recent Labs Lab 02/04/13 1315  COLORURINE YELLOW  LABSPEC 1.036*  PHURINE 5.5  GLUCOSEU NEGATIVE  HGBUR NEGATIVE  BILIRUBINUR SMALL*  KETONESUR NEGATIVE  PROTEINUR NEGATIVE  UROBILINOGEN 0.2  NITRITE NEGATIVE  LEUKOCYTESUR NEGATIVE   Lipid Panel    Component Value Date/Time   CHOL 205* 02/04/2013 0500   TRIG 189* 02/04/2013 0500   HDL 40 02/04/2013 0500   CHOLHDL 5.1 02/04/2013 0500   VLDL 38 02/04/2013 0500   LDLCALC 127* 02/04/2013 0500   Urine Drug Screen:      Component Value Date/Time   LABOPIA NONE DETECTED 02/04/2013 1315    Alcohol Level: No results found for this basename: ETH,  in the last 168 hours  Hypercoagulable Workup Normal - ESR, ANA, Anti III, homocysteine, Protein C total, Protein S total, cardiolipin antibody Prot C activity 147 (high) Lupus anticoagulant -none detected Cardiolipin antibodies -IgG low at 3 - IgM low at 0 - IgA low at 9 Protein S activity - 82% within normal limits HIV - non-reactive RPR - non-reactive C3 complement -  167 within normal limits C4 complement - 39 upper limits of normal Complement total - > 60 (high)  Dg Chest 2 View 02/03/2013    No acute abnormality. .   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    MRI/MRA Head Wo Contrast 02/03/2013    MRI HEAD - Acute infarction throughout the left anterior cerebral artery territory.  Mild swelling but no evidence of hemorrhage or mass effect/shift.  Extensive chronic small vessel ischemic changes elsewhere throughout the cerebral hemispheric white matter bilaterally.   MRA HEAD  Abrupt occlusion of the left anterior cerebral artery 8 mm beyond its origin.  Both anterior cerebral arteries receive there supply from the left carotid circulation, the A1 segment on the right being likely aplastic.  Distal vessel atherosclerotic narrowing and irregularity diffusely.   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 The right vertebral artery is the dominant vessel.     2D Echocardiogram EF 65% , no regional wlal motion abnormalities, no cardiac source of embolism identified.  TEE  Normal LV function; negative saline microcavitation study. There is an echodensity in the descending aorta that most likely represents significant atherosclerotic plaque;  Carotid Doppler  No evidence of hemodynamically significant internal carotid artery stenosis. Vertebral artery flow is antegrade.   EEG: this is an abnormal awake and drowsy EEG with findings consistent with focal neurological dysfunction involving the left cerebral hemisphere, notably the left temporo-parietal region. Please, be aware that the absence of epileptiform discharges does not exclude the possibility of epilepsy  EKG  normal sinus rhythm.   Therapy Recommendations CIR  Physical Exam   Mental Status:  Alert, oriented to place and year - not oriented to month,  thought content appropriate. Speech dysarthric with mild evidence of expressive aphasia. Able to follow simple commands with some difficulty.  Cranial Nerves:  II: Visual fields grossly normal, pupils equal, round, reactive to light and accommodation  III,IV, VI: ptosis on right present, extra-ocular motions intact bilaterally  V,VII: Face asymmetric on the right, facial light touch sensation normal bilaterally  VIII: hearing normal bilaterally  IX,X: gag reflex present  XI: bilateral shoulder shrug  XII: midline tongue extension  Motor:  Right : Upper extremity - 2-3/5   Left: upper extremity 5/5  Right lower extremity - 0/5    Left lower extremity - 5/5  --unable to abduct right arm, no tricep strength, 4/5 bicep, weak grip and increased tone on right arm  --right leg has increased tone and no movement  Sensory: Pinprick and light touch intact throughout, bilaterally but feel his right leg is decreased  Deep Tendon  Reflexes: 2+ left bicep and tricep, 3+right bicep, tricep and brachioradialis, 2+ right KJ and AJ, 1+ left KJ and no AJ and symmetric throughout  Plantars:  Right: downgoing Left: downgoing  Cerebellar:  normal finger-to-nose on left, normal heel-to-shin test on left  ASSESSMENT [name] is a 57 y.o. male presenting with right sided hemiparesis, expressive aphasia. 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 Infarct felt to be embolic, no source found, including TEE which was negative for source of stroke. On no antithrombotics prior to admission. Now on aspirin 325 mg orally every day for secondary stroke prevention. Patient with resultant right hemiparesis and mild expressive aphasia. Plans for CIR.   Hypertension  Hyperlipidemia, LDL 127, not on statin prior to admission, On Lipitor now  Smoker   HGBa1c 5.7  UDS positive for THC, now associated  with stroke  hypercoag panel thus far unrevealing  Plaque in descending aorta an incidental finding during TEE and not felt to be related to acute stroke.  Hospital day # 6  TREATMENT/PLAN  Continue aspirin 325 mg orally every day for secondary stroke prevention.  Smoking cessation   Risk factor modification Outpatient telemetry monitoring to assess patient for atrial fibrillation as source of stroke. arranged with [place]. To be done post rehab  Discharge disposition -  Medically ready for CIR from stroke standpoint Patient has a 10-15% risk of having another stroke over the next year, the highest risk is within 2 weeks of the most recent stroke/TIA (risk of having a stroke following a stroke or TIA is the same). Stroke Service will sign off. Please call should any needs arise. Follow up with Dr. Pearlean Brownie, Stroke Clinic, in 2 months.  Annie Main, MSN, RN, ANVP-BC, ANP-BC, [facility] Pager: [phone] 02/09/2013 12:40 PM   I have personally obtained a history, examined the patient,  evaluated imaging results, and formulated the assessment and plan of care. I agree with the above. TEE then CIR. Discussed with Dr. Ardyth Harps. Delia Heady, MD

## 2013-02-09 NOTE — PMR Pre-admission (Signed)
PMR Admission Coordinator Pre-Admission Assessment  Patient: [name] is an 57 y.o., male MRN: [id number] DOB: September 29, 1955 Height: 5\' 9"  (175.3 cm) Weight: 83.915 kg (185 lb)              Insurance Information HMO:     PPO: yes     PCP:      IPA:      80/20:      OTHER:  PRIMARY: [employer]      Policy#: [id number]      Subscriber: wife works for [employer] CM Name: [name]      Phone#: [phone]     Fax#: [phone] Pre-Cert#: [id number] send initial eval in 1 to 2 days and then update in 7 days      Employer: [employer] Benefits:  Phone #: [phone]     Name: 5/27 Eff. Date: 12/2102     Deduct: $1900      Out of Pocket Max: $4000      Life Max: unlimited CIR: 80%       SNF: 80% 60 days max per year Outpatient: $50 copay per visit     Co-Pay: 60 days  Home Health: 80%      Co-Pay: 20% 60 visits combined DME: 80%     Co-Pay: 20% Providers: in network  SECONDARY: none       Medicaid Application Date:       Case Manager:  Disability Application Date:       Case Worker:   Emergency Conservator, museum/gallery Information   Name Relation Home Work Mobile   [name] Spouse   [phone]   [name]   [phone]     Current Medical History  Patient Admitting Diagnosis: left ACA infarct  History of Present Illness: [name] is a 57 y.o. right-handed male with history of hypertension. Admitted 02/03/2013 with right-sided weakness and aphasia. MRI of the brain showed acute infarct throughout the left anterior cerebral artery territory. MRA of the head with abrupt occlusion of the left anterior cerebral artery 8 mm beyond its origin. Patient did not receive TPA. EEG consistent with focal neurological dysfunction involving the left cerebral hemisphere notably the left temporal parietal region. Echocardiogram with ejection fraction of 65% without embolism. Carotid Dopplers with no ICA stenosis. Neurology services consulted placed on aspirin for stroke  prophylaxis as well as subcutaneous Lovenox for DVT prophylaxis.TEE 5/27 pending. Hyperlipidemia , LDL 127, not on statin prior to admission, On Lipitor now. Hgb A1C 5.7. Smoker. UDS positive for THC. Recommend outpatient telemetry monitoring to assess patient for atrial fibrillation as source of stroke. Arranged with [facility] cardiology.    Total: 9 [facility]    Past Medical History  Past Medical History  Diagnosis Date  . Hypertension   . Anginal pain   . Heart murmur   . Stroke 02/01/2013    left frontal CVA and symptoms of right arm and leg weakness and expressive aphasia/notes 02/03/2013 (02/03/2013)  . Psoriasis     "back and stomach" (02/03/2013)    Family History  family history is not on file.  Prior Rehab/Hospitalizations: none  Current Medications  Current facility-administered medications:[MAR HOLD] acetaminophen (TYLENOL) tablet 650 mg, 650 mg, Oral, Q4H PRN, Roma Kayser Schorr, NP, 650 mg at 02/05/13 0700;  [IRJ HOLD] aspirin tablet 325 mg, 325 mg, Oral, Daily, Zannie Cove, MD, 325 mg at 02/08/13 1027;  [MAR HOLD] atorvastatin (LIPITOR) tablet 10 mg, 10 mg, Oral, q1800, Layne Benton, NP, 10 mg at 02/08/13  1700 [MAR HOLD] enoxaparin (LOVENOX) injection 40 mg, 40 mg, Subcutaneous, Q24H, Zannie Cove, MD, 40 mg at 02/08/13 2104;  [facility] HOLD] nicotine (NICODERM CQ - dosed in mg/24 hours) patch 14 mg, 14 mg, Transdermal, Q24H, Zannie Cove, MD, 14 mg at 02/08/13 2107;  [MAR HOLD] senna-docusate (Senokot-S) tablet 1 tablet, 1 tablet, Oral, QHS PRN, Zannie Cove, MD [facility] HOLD] thiamine (VITAMIN B-1) tablet 100 mg, 100 mg, Oral, Daily, Henderson Cloud, MD, 100 mg at 02/08/13 1027  Patients Current Diet: Cardiac for TEE 5/27. Regular diet with thin liquids otherwise  Precautions / Restrictions Precautions Precautions: Fall Precaution Comments: Pt impulsive with poor awareness of deficits; Rt. 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 Pt unable to state if he stopped working due to disability or for other reason  Current Functional Level Cognition  Arousal/Alertness: Awake/alert Overall Cognitive Status: Impaired/Different from baseline Current Attention Level: Selective Orientation Level: Oriented X4 Safety/Judgement: Decreased awareness of deficits;Decreased awareness of safety General Comments: pt's impulsivity continues, though remains less and manageable if he is cued.  Communication between L and R brain remains hampered.  Neglect remains significant, with pt appearing to use his mouth interchangeably with his R hand or as a go between to help the transition to initiating with the R Ue Attention: Sustained Sustained Attention: Impaired Sustained Attention Impairment: Functional basic Memory: Impaired Memory Impairment:  (pt admits to memory changes, did not recall testing results) Problem Solving: Impaired Problem Solving Impairment: Functional basic    Extremity Assessment (includes Sensation/Coordination)  RUE ROM/Strength/Tone: Deficits RUE ROM/Strength/Tone Deficits: Pt. able to extend digits, touch mouth, and reach to ~60* with Rt. UE.  Neglect present, so difficult to accurately asses Rt. UE movement RUE Sensation: WFL - Light Touch RUE Coordination: Deficits RUE Coordination Deficits: neglect/weakness  RLE ROM/Strength/Tone: Deficits RLE ROM/Strength/Tone Deficits: associated movement only during MMT, otherwise no movement to command, but can grossly advance leg during gait RLE Sensation: Deficits RLE Sensation Deficits:  someinconsistencies, but generally good ability to sense LT RLE Coordination: Deficits RLE Coordination Deficits: poor gross coordination    ADLs  Eating/Feeding: Supervision/safety Where Assessed - Eating/Feeding: Bed level Grooming: Minimal assistance;Other (comment) (mod verbal cues) Where Assessed - Grooming: Unsupported standing Upper Body Bathing: Moderate assistance Where Assessed - Upper Body Bathing: Supine, head of bed up;Supported sitting Lower Body Bathing: +2 Total assistance Lower Body Bathing: Patient Percentage: 40% Where Assessed - Lower Body Bathing: Supported sit to stand Upper Body Dressing: Moderate assistance (gown) Where Assessed - Upper Body Dressing: Unsupported sitting Lower Body Dressing: +2 Total assistance Lower Body Dressing: Patient Percentage: 30% Where Assessed - Lower Body Dressing: Supported sit to stand Toilet Transfer: +2 Total assistance Toilet Transfer: Patient Percentage: 50% Toilet Transfer Method: Sit to stand;Stand pivot Acupuncturist: Comfort height toilet;Grab bars Toileting - Clothing Manipulation and Hygiene: +2 Total assistance Toileting - Clothing Manipulation and Hygiene: Patient Percentage: 50% Where Assessed - Toileting Clothing Manipulation and Hygiene: Standing Transfers/Ambulation Related to ADLs: Pt ambulated to BR with +2 total A (pt ~50-60%).  Pt requires assist/facilitation to weighshift to Rt and to advance Rt. LE ADL Comments: While EOB, worked on reaching [employer] and  Rt, crossing midline during reach with Rt UE - requires min facilitation and increased time, and handing object from Rt hand to Lt. hand.  Pt noted to have significant difficulty crossing midline and with bil tasks.  Difficutly realeasing object and when attempting to place object in Rt. hand, he will place it in his mouth several times before finally placing it in his Rt hand.  Pt is aware of the error, and is able to correct with min verbal cues and several  attempts.  While brushing teeth, requires mod verbal cues to use bil. hands to replace and remove top on toothpaste.  pt brushed teeth with Rt. UE for ~75% of task. Difficulty sequencing and timing rinsing and spitting of water.    Mobility  Bed Mobility: Sit to Supine Supine to Sit: 4: Min guard;With rails;HOB flat Sitting - Scoot to Edge of Bed: 4: Min assist Sit to Supine: 3: Mod assist;HOB flat;Other (comment) (with R LE only)    Transfers  Transfers: Sit to Stand;Stand to Sit Sit to Stand: 3: Mod assist;With upper extremity assist;From bed Sit to Stand: Patient Percentage: 60% Stand to Sit: 3: Mod assist;To bed;With upper extremity assist Stand to Sit: Patient Percentage: 60%    Ambulation / Gait / Stairs / Wheelchair Mobility  Ambulation/Gait Ambulation/Gait Assistance: 1: +2 Total assist Ambulation/Gait: Patient Percentage: 40% Ambulation Distance (Feet): 14 Feet (x2 to/from the bathroom) Assistive device: 2 person hand held assist Ambulation/Gait Assistance Details: facilitation for w/shift and support as R LE is assisted forward with max assist.  Also trunk needed translation forward otherwise R side remained rotated behind theL side. Gait Pattern: Right circumduction;Step-to pattern;Decreased step length - right;Decreased stance time - right;Decreased hip/knee flexion - right;Ataxic;Narrow base of support Stairs: No    Posture / Balance Static Sitting Balance Static Sitting - Balance Support: No upper extremity supported;Feet supported Static Sitting - Level of Assistance: 5: Stand by assistance Static Sitting - Comment/# of Minutes: 5+ min Dynamic Sitting Balance Dynamic Sitting - Balance Support: Feet supported Dynamic Sitting - Level of Assistance: 5: Stand by assistance Dynamic Sitting Balance - Compensations: brushing hair with supervision for balance Dynamic Sitting - Balance Activities:  (ADL) Static Standing Balance Static Standing - Balance Support: No upper  extremity supported Static Standing - Level of Assistance: 4: Min assist Static Standing - Comment/# of Minutes: Varieable level of assist Dynamic Standing Balance Dynamic Standing - Balance Support: Left upper extremity supported;Right upper extremity supported;No upper extremity supported;During functional activity Dynamic Standing - Level of Assistance: 1: +2 Total assist Dynamic Standing - Balance Activities: Lateral lean/weight shifting;Forward lean/weight shifting;Reaching across midline;Other (comment) (brushing teeth, biased task to use R UE) Dynamic Standing - Comments: Still leans heavily R unless facil. into midline.    Special needs/care consideration Bowel mgmt: continent Bladder mgmt: continent    Previous Home Environment Living Arrangements: Spouse/significant other Lives With: Spouse Available Help at Discharge: Family Type of Home: House Home Layout: One level Home Access: Level entry Entrance Stairs-Number of Steps: no steps per wife Bathroom Shower/Tub: Engineer, manufacturing systems: Standard Bathroom Accessibility: Yes How Accessible: Accessible via walker Home Care Services: No Additional Comments: Pt did not mention living with son during OT/PT eval.  He reports wife works and does not have assistance during the day  Discharge Living Setting Plans for Discharge Living Setting: Patient's home;Lives with (comment) (wife) Type of Home at Discharge: House Discharge Home Layout: One level Discharge Home Access: Level entry Discharge Bathroom Shower/Tub: Tub/shower unit  Discharge Bathroom Toilet: Standard Discharge Bathroom Accessibility: Yes How Accessible: Accessible via walker Do you have any problems obtaining your medications?: No  Social/Family/Support Systems Patient Roles: Spouse;Parent Contact Information: Auren Valdes, wife Anticipated Caregiver: wife Anticipated Caregiver's Contact Information: cell [phone] Ability/Limitations of  Caregiver: wife works for [employer] from home Caregiver Availability: 24/7 Discharge Plan Discussed with Primary Caregiver: Yes Is Caregiver In Agreement with Plan?: Yes Does Caregiver/Family have Issues with Lodging/Transportation while Pt is in Rehab?: No    Goals/Additional Needs Patient/Family Goal for Rehab: Min assist PT, Min to Mod assist OT, Min to Mod assist SLP Expected length of stay: ELOS 2 to 3 weeks Pt/Family Agrees to Admission and willing to participate: Yes Program Orientation Provided & Reviewed with Pt/Caregiver Including Roles  & Responsibilities: Yes   Decrease burden of Care through IP rehab admission: n/a  Possible need for SNF placement upon discharge:not anticipated   Patient Condition: This patient's medical and functional status has changed since the consult dated: 02/05/13 in which the Rehabilitation Physician determined and documented that the patient's condition is appropriate for intensive rehabilitative care in an inpatient rehabilitation facility. See "History of Present Illness" (above) for medical update. Medical work up completion to be done today.Functional changes are: mod to max assist with right neglect , impulsive and poor awareness of deficits.Patient's medical and functional status update has been discussed with the Rehabilitation physician and patient remains appropriate for inpatient rehabilitation. Will admit to inpatient rehab today.  Preadmission Screen Completed By:  Clois Dupes, 02/09/2013 3:04 PM ______________________________________________________________________   Discussed status with Dr. [name] Kill on 02/09/13 at  1504 and received telephone approval for admission today.  Admission Coordinator:  Clois Dupes, time 6962 Date 02/09/2013

## 2013-02-09 NOTE — Progress Notes (Signed)
Echocardiogram Echocardiogram Transesophageal has been performed.  [name] 02/09/2013, 1:06 PM

## 2013-02-09 NOTE — Discharge Summary (Signed)
Physician Discharge Summary  [name] [id number] DOB: Nov 23, 1955 DOA: 02/03/2013  PCP: Benita Stabile, MD  Admit date: 02/03/2013 Discharge date: 02/09/2013  Time spent: Greater than 30 minutes  Recommendations for Outpatient Follow-up:  -To CIR today.   Discharge Diagnoses:  Active Problems:   CVA (cerebral infarction)   HTN (hypertension)   Tobacco abuse   Discharge Condition: Stable and improved  Filed Weights   02/03/13 1805  Weight: 83.915 kg (185 lb)    History of present illness:  Patient is a 57 y.o. male with h/o HTN, tobacco use present to the ER today with the above complaint, he initially noticed the R arm and leg weakness on Monday night along with difficulty finding words. Per Pt he just didn't feel like coming to the ER when his symptoms started, this morning he could not get out of bed and hence called EMS and was brought to the ER.  Upon evaluation in ER, CT head revealed subacute left anterior cerebral artery infarct. We were asked to admit him for further evaluation and management.   Hospital Course:   Acute left ACA CVA  -2-D echo/carotid Doppler are within normal limits.  -On aspirin for secondary stroke prevention.  -Smoking cessation will be important in this gentleman.  -TEE negative. -To CIR today hopefully. -Will need to have OP event monitor arranged for when he leaves CIR.  Hypertension  -Allow permissive hypertension given CVA.   Tobacco abuse  -Cessation encouraged.   Alcohol abuse  -Thiamine, multivitamin.  -Monitor for withdrawals.   Procedures: ECHO: Left ventricle: The cavity size was normal. Wall thickness was increased in a pattern of moderate LVH. The estimated ejection fraction was 65%. Wall motion was normal; there were no regional wall motion abnormalities. - Impressions: No cardiac source of embolism was identified, but cannot be ruled out on the basis of this examination.  Carotid Dopplers: No significant  extracranial carotid artery stenosis demonstrated. Vertebrals are patent with antegrade flow.     Consultations:  Neurology, Dr. Pearlean Brownie  Discharge Instructions     Medication List    TAKE these medications       clobetasol cream 0.05 %  Commonly known as:  TEMOVATE  Apply 1 application topically daily as needed (psoriasis).     desonide 0.05 % cream  Commonly known as:  DESOWEN  Apply 1 application topically daily as needed (psoriasis).     hydrochlorothiazide 25 MG tablet  Commonly known as:  HYDRODIURIL  Take 25 mg by mouth daily.     hydrOXYzine 10 MG tablet  Commonly known as:  ATARAX/VISTARIL  Take 10 mg by mouth daily.       Allergies  Allergen Reactions  . Shellfish Allergy Itching      The results of significant diagnostics from this hospitalization (including imaging, microbiology, ancillary and laboratory) are listed below for reference.    Significant Diagnostic Studies: Dg Chest 2 View  02/03/2013   *RADIOLOGY REPORT*  Clinical Data: Stroke  CHEST - 2 VIEW  Comparison: None.  Findings: The lungs are clear.  Negative for pneumonia or heart failure.  No mass lesion is identified.  Heart size is within normal limits.  IMPRESSION: No acute abnormality.   Original Report Authenticated By: Janeece Riggers, M.D.   Ct Head Wo Contrast  02/03/2013   *RADIOLOGY REPORT*  Clinical Data: Acute stroke.  Occipital headache.  CT HEAD WITHOUT CONTRAST  Technique:  Contiguous axial images were obtained from the base of the skull through the  vertex without contrast.  Comparison: None.  Findings: There is a subacute nonhemorrhagic left anterior cerebral artery distribution infarct with secondary edema without midline shift and only minimal mass  effect upon the anterior aspect of the interhemispheric falx.  Ventricles are not compressed.  The brain parenchyma is otherwise normal.  No osseous abnormality.  IMPRESSION: Non-hemorrhagic subacute left anterior cerebral artery infarct.    Original Report Authenticated By: Francene Boyers, M.D.   [facility] Wo Contrast  02/03/2013   *RADIOLOGY REPORT*  Clinical Data:  Right-sided weakness beginning 48 hours ago.  MRI HEAD WITHOUT CONTRAST MRA HEAD WITHOUT CONTRAST  Technique:  Multiplanar, multiecho pulse sequences of the brain and surrounding structures were obtained without intravenous contrast. Angiographic images of the head were obtained using MRA technique without contrast.  Comparison:  Head CT same day  MRI HEAD  Findings:  There is minimal chronic small vessel change within the pons.  No cerebellar abnormality.  There is acute infarction throughout the majority of the left anterior cerebral artery territory including the corpus callosum to the left of midline.  No other areas of acute infarction.  There are extensive chronic small vessel ischemic changes throughout the hemispheric white matter bilaterally.  There is an old lacunar infarction in the right basal ganglia.  No sign of acute hemorrhage, hydrocephalus, mass lesion or extra-axial collection.  The area of infarction shows mild swelling.  No pituitary mass.  There is a small amount of fluid in the left division of the sphenoid sinus.  The other sinuses are clear.  IMPRESSION: Acute infarction throughout the left anterior cerebral artery territory.  Mild swelling but no evidence of hemorrhage or mass effect/shift.  Extensive chronic small vessel ischemic changes elsewhere throughout the cerebral hemispheric white matter bilaterally.  MRA HEAD  Findings: Both internal carotid arteries are patent through the siphon regions without apparent stenosis.  On the right, the internal carotid artery supplies only the middle cerebral artery territory.  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  No MCA stenosis in the proximal or major branches.  More distal branch vessels do show atherosclerotic irregularity.  The left internal carotid artery supplies the left middle cerebral artery territory and  both anterior cerebral artery territories. There is abrupt occlusion of the left anterior cerebral artery 8 mm beyond its origin.  There is mild distal vessel atherosclerotic narrowing and irregularity diffusely.  Both vertebral arteries are patent to the basilar with the right being dominant.  There is stenosis at the left vertebral basilar junction.  No basilar stenosis.  Posterior circulation branch vessels are patent but do show distal vessel atherosclerotic irregularity.  IMPRESSION: Abrupt occlusion of the left anterior cerebral artery 8 mm beyond its origin.  Both anterior cerebral arteries receive there supply from the left carotid circulation, the A1 segment on the right being likely aplastic.  Distal vessel atherosclerotic narrowing and irregularity diffusely.  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 The right vertebral artery is the dominant vessel.   Original Report Authenticated By: Paulina Fusi, M.D.   Mr Brain Wo Contrast  02/03/2013   *RADIOLOGY REPORT*  Clinical Data:  Right-sided weakness beginning 48 hours ago.  MRI HEAD WITHOUT CONTRAST MRA HEAD WITHOUT CONTRAST  Technique:  Multiplanar, multiecho pulse sequences of the brain and surrounding structures were obtained without intravenous contrast. Angiographic images of the head were obtained using MRA technique without contrast.  Comparison:  Head CT same day  MRI HEAD  Findings:  There is minimal  chronic small vessel change within the pons.  No cerebellar abnormality.  There is acute infarction throughout the majority of the left anterior cerebral artery territory including the corpus callosum to the left of midline.  No other areas of acute infarction.  There are extensive chronic small vessel ischemic changes throughout the hemispheric white matter bilaterally.  There is an old lacunar infarction in the right basal ganglia.  No sign of acute hemorrhage, hydrocephalus, mass lesion or extra-axial collection.  The area  of infarction shows mild swelling.  No pituitary mass.  There is a small amount of fluid in the left division of the sphenoid sinus.  The other sinuses are clear.  IMPRESSION: Acute infarction throughout the left anterior cerebral artery territory.  Mild swelling but no evidence of hemorrhage or mass effect/shift.  Extensive chronic small vessel ischemic changes elsewhere throughout the cerebral hemispheric white matter bilaterally.  MRA HEAD  Findings: Both internal carotid arteries are patent through the siphon regions without apparent stenosis.  On the right, the internal carotid artery supplies only the middle cerebral artery territory.  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  No MCA stenosis in the proximal or major branches.  More distal branch vessels do show atherosclerotic irregularity.  The left internal carotid artery supplies the left middle cerebral artery territory and both anterior cerebral artery territories. There is abrupt occlusion of the left anterior cerebral artery 8 mm beyond its origin.  There is mild distal vessel atherosclerotic narrowing and irregularity diffusely.  Both vertebral arteries are patent to the basilar with the right being dominant.  There is stenosis at the left vertebral basilar junction.  No basilar stenosis.  Posterior circulation branch vessels are patent but do show distal vessel atherosclerotic irregularity.  IMPRESSION: Abrupt occlusion of the left anterior cerebral artery 8 mm beyond its origin.  Both anterior cerebral arteries receive there supply from the left carotid circulation, the A1 segment on the right being likely aplastic.  Distal vessel atherosclerotic narrowing and irregularity diffusely.  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 The right vertebral artery is the dominant vessel.   Original Report Authenticated By: Paulina Fusi, M.D.    Microbiology: No results found for this or any previous visit (from the past 240 hour(s)).    Labs: Basic Metabolic Panel:  Recent Labs Lab 02/03/13 1323 02/05/13 0530  NA 139 135  K 4.1 3.4*  CL 102 99  CO2 22 22  GLUCOSE 107* 110*  BUN 18 14  CREATININE 1.17 1.12  CALCIUM 9.8 9.1   Liver Function Tests:  Recent Labs Lab 02/05/13 0530  AST 27  ALT 32  ALKPHOS 118*  BILITOT 0.8  PROT 6.9  ALBUMIN 3.3*   No results found for this basename: LIPASE, AMYLASE,  in the last 168 hours No results found for this basename: AMMONIA,  in the last 168 hours CBC:  Recent Labs Lab 02/03/13 1323  WBC 8.3  NEUTROABS 5.4  HGB 17.2*  HCT 49.4  MCV 83.6  PLT 259   Cardiac Enzymes: No results found for this basename: CKTOTAL, CKMB, CKMBINDEX, TROPONINI,  in the last 168 hours BNP: BNP (last 3 results) No results found for this basename: PROBNP,  in the last 8760 hours CBG: No results found for this basename: GLUCAP,  in the last 168 hours     Signed:  Chaya Jan  [facility] Pager: [phone] 02/09/2013, 1:26 PM

## 2013-02-09 NOTE — H&P (View-Only) (Signed)
[facility] PROGRESS NOTE  [name] MRN:[id number] DOB: 10/17/1955 DOA: 02/03/2013 PCP: [name] DEAN, MD  Assessment/Plan: Acute left ACA CVA -2-D echo/carotid Doppler are within normal limits. -On aspirin for secondary stroke prevention. -Smoking cessation will be important in this gentleman. -TEE arranged by neuro..? When. -Hopefully for CIR next week.  Hypertension -Allow permissive hypertension given CVA.  Tobacco abuse -Cessation encouraged.  Alcohol abuse -Thiamine, multivitamin. -Monitor for withdrawals.  DVT prophylaxis -Lovenox.  Code Status: Full code Family Communication: Patient only  Disposition Plan: Awaiting insurance approval for CIR.   Consultants:  Neurology, [name]   Antibiotics:  None   Subjective: No complaints  Objective: Filed Vitals:   02/07/13 2100 02/08/13 0100 02/08/13 0602 02/08/13 0943  BP: 170/100 156/92 156/87 153/93  Pulse:  71 67 78  Temp:  98.1 F (36.7 C) 98.2 F (36.8 C) 97.5 F (36.4 C)  TempSrc:  Oral Oral Oral  Resp:  20 18 16  Height:      Weight:      SpO2:  100% 100% 100%    Intake/Output Summary (Last 24 hours) at 02/08/13 1326 Last data filed at 02/08/13 0800  Gross per 24 hour  Intake    360 ml  Output    250 ml  Net    110 ml   Filed Weights   02/03/13 1805  Weight: 83.915 kg (185 lb)    Exam:   General:  Alert, awake, oriented x3, in no distress  Cardiovascular: Regular rate and rhythm, no murmurs, rubs or gallops  Respiratory: Clear to auscultation bilaterally  Abdomen: Soft, nontender, nondistended, positive bowel sounds, no masses or organomegaly noted  Extremities: No clubbing, cyanosis or edema, positive pedal pulses.   Neurologic:  Right-sided flaccid paralysis  Data Reviewed: Basic Metabolic Panel:  Recent Labs Lab 02/03/13 1323 02/05/13 0530  NA 139 135  K 4.1 3.4*  CL 102 99  CO2 22 22  GLUCOSE 107* 110*  BUN 18 14  CREATININE 1.17 1.12   CALCIUM 9.8 9.1   Liver Function Tests:  Recent Labs Lab 02/05/13 0530  AST 27  ALT 32  ALKPHOS 118*  BILITOT 0.8  PROT 6.9  ALBUMIN 3.3*   No results found for this basename: LIPASE, AMYLASE,  in the last 168 hours No results found for this basename: AMMONIA,  in the last 168 hours CBC:  Recent Labs Lab 02/03/13 1323  WBC 8.3  NEUTROABS 5.4  HGB 17.2*  HCT 49.4  MCV 83.6  PLT 259   Cardiac Enzymes: No results found for this basename: CKTOTAL, CKMB, CKMBINDEX, TROPONINI,  in the last 168 hours BNP (last 3 results) No results found for this basename: PROBNP,  in the last 8760 hours CBG: No results found for this basename: GLUCAP,  in the last 168 hours  No results found for this or any previous visit (from the past 240 hour(s)).   Studies: No results found.  Scheduled Meds: . aspirin  325 mg Oral Daily  . atorvastatin  10 mg Oral q1800  . enoxaparin (LOVENOX) injection  40 mg Subcutaneous Q24H  . nicotine  14 mg Transdermal Q24H  . thiamine  100 mg Oral Daily   Continuous Infusions:   Active Problems:   CVA (cerebral infarction)   HTN (hypertension)   Tobacco abuse    Time spent: 25 minutes    [name]  [facility] Pager [phone]  If 7PM-7AM, please contact night-coverage at [email or web address], password [email or web address] 02/08/2013, 1:26 PM  LOS:   5 days        

## 2013-02-09 NOTE — Interval H&P Note (Signed)
History and Physical Interval Note:  02/09/2013 12:04 PM  [name]  has presented today for surgery, with the diagnosis of a fib  The various methods of treatment have been discussed with the patient and family. After consideration of risks, benefits and other options for treatment, the patient has consented to  Procedure(s): TRANSESOPHAGEAL ECHOCARDIOGRAM (TEE) (N/A) as a surgical intervention .  The patient's history has been reviewed, patient examined, no change in status, stable for surgery.  I have reviewed the patient's chart and labs.  Questions were answered to the patient's satisfaction.     Olga Millers

## 2013-02-09 NOTE — Progress Notes (Signed)
Noted TEE today. I await insurance approval today and am hopeful to admit pt to inpt rehab today after TEE complete. Patient aware and I have contacted his wife by phone and she is also in agreement. [phone]

## 2013-02-09 NOTE — CV Procedure (Signed)
See full TEE report in [employer]; Normal LV function; negative saline microcavitation study. There is an echodensity in the descending aorta that most likely represents significant atherosclerotic plaque; suggest TEE to better assess. [name]

## 2013-02-09 NOTE — H&P (Signed)
Physical Medicine and Rehabilitation Admission H&P  Chief Complaint   Patient presents with   .  Cerebrovascular Accident     R sided weakness onset 02/01/2013 2200   :  HPI: [name] is a 57 y.o. right-handed male with history of hypertension as well as tobacco abuse. Admitted 02/03/2013 with right-sided weakness and aphasia. MRI of the brain showed acute infarct throughout the left anterior cerebral artery territory. MRA of the head with abrupt occlusion of the left anterior cerebral artery 8 mm beyond its origin. UDS positive for marijuana. Patient did not receive TPA. EEG consistent with focal neurological dysfunction involving the left cerebral hemisphere notably the left temporal parietal region. Echocardiogram with ejection fraction of 65% without embolism. Carotid Dopplers with no ICA stenosis. Neurology services consulted placed on aspirin for stroke prophylaxis as well as subcutaneous Lovenox for DVT prophylaxis. TEE 02/09/2013 showed normal LV function and no thrombus without PFO. Physical and occupational therapy evaluations completed recommendations for physical medicine rehabilitation consult to consider inpatient rehabilitation services. Patient was felt to be a good candidate for inpatient rehabilitation services and was admitted for comprehensive rehabilitation program    Review of Systems  Cardiovascular: Positive for palpitations.  Musculoskeletal: Positive for myalgias.  All other systems reviewed and are negative  Past Medical History   Diagnosis  Date   .  Hypertension    .  Anginal pain    .  Heart murmur    .  Stroke  02/01/2013     left frontal CVA and symptoms of right arm and leg weakness and expressive aphasia/notes 02/03/2013 (02/03/2013)   .  Psoriasis      "back and stomach" (02/03/2013)    Past Surgical History   Procedure  Laterality  Date   .  No past surgeries      History reviewed. No pertinent family history.  Social History: reports that he has been  smoking Cigarettes. He has a 9.25 pack-year smoking history. He does not have any smokeless tobacco history on file. He reports that he drinks about 10.2 ounces of alcohol per week. He reports that he does not use illicit drugs.  Allergies:  Allergies   Allergen  Reactions   .  Shellfish Allergy  Itching    Medications Prior to Admission   Medication  Sig  Dispense  Refill   .  clobetasol cream (TEMOVATE) 0.05 %  Apply 1 application topically daily as needed (psoriasis).     Marland Kitchen  desonide (DESOWEN) 0.05 % cream  Apply 1 application topically daily as needed (psoriasis).     .  hydrochlorothiazide (HYDRODIURIL) 25 MG tablet  Take 25 mg by mouth daily.     .  hydrOXYzine (ATARAX/VISTARIL) 10 MG tablet  Take 10 mg by mouth daily.      Home:  Home Living  Lives With: Spouse  Available Help at Discharge: Family  Type of Home: Mobile home  Home Access: Stairs to enter  Entrance Stairs-Number of Steps: 0 at front and 3 at back ??  Home Layout: One level  Bathroom Shower/Tub: Medical sales representative: Standard  Home Adaptive Equipment: None  Additional Comments: Pt did not mention living with son during OT/PT eval. 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 Pt unable  to state if he stopped working due to disability or for other reason  Functional Status:  Mobility:  Bed Mobility  Bed Mobility: Sit to Supine  Supine to Sit: 4: Min guard;With rails;HOB flat  Sitting - Scoot to Edge of Bed: 4: Min assist  Sit to Supine: 3: Mod assist;HOB flat;Other (comment) (with R LE only)  Transfers  Transfers: Sit to Stand;Stand to Sit  Sit to Stand: 3: Mod assist;With upper extremity assist;From bed  Sit to Stand: Patient Percentage: 60%  Stand to Sit: 3: Mod assist;To bed;With  upper extremity assist  Stand to Sit: Patient Percentage: 60%  Ambulation/Gait  Ambulation/Gait Assistance: 1: +2 Total assist  Ambulation/Gait: Patient Percentage: 40%  Ambulation Distance (Feet): 14 Feet (x2 to/from the bathroom)  Assistive device: 2 person hand held assist  Ambulation/Gait Assistance Details: facilitation for w/shift and support as R LE is assisted forward with max assist. Also trunk needed translation forward otherwise R side remained rotated behind theL side.  Gait Pattern: Right circumduction;Step-to pattern;Decreased step length - right;Decreased stance time - right;Decreased hip/knee flexion - right;Ataxic;Narrow base of support  Stairs: No   ADL:  ADL  Eating/Feeding: Supervision/safety  Where Assessed - Eating/Feeding: Bed level  Grooming: Minimal assistance;Other (comment) (mod verbal cues)  Where Assessed - Grooming: Unsupported standing  Upper Body Bathing: Moderate assistance  Where Assessed - Upper Body Bathing: Supine, head of bed up;Supported sitting  Lower Body Bathing: +2 Total assistance  Where Assessed - Lower Body Bathing: Supported sit to stand  Upper Body Dressing: Moderate assistance (gown)  Where Assessed - Upper Body Dressing: Unsupported sitting  Lower Body Dressing: +2 Total assistance  Where Assessed - Lower Body Dressing: Supported sit to stand  Toilet Transfer: +2 Total assistance  Toilet Transfer Method: Sit to stand;Stand pivot  Toilet Transfer Equipment: Comfort height toilet;Grab bars  Transfers/Ambulation Related to ADLs: Pt ambulated to BR with +2 total A (pt ~50-60%). Pt requires assist/facilitation to weighshift to Rt and to advance Rt. LE  ADL Comments: While EOB, worked on reaching Lt and Rt, crossing midline during reach with Rt UE - requires min facilitation and increased time, and handing object from Rt hand to Lt. hand. Pt noted to have significant difficulty crossing midline and with bil tasks. Difficutly realeasing object  and when attempting to place object in Rt. hand, he will place it in his mouth several times before finally placing it in his Rt hand. Pt is aware of the error, and is able to correct with min verbal cues and several attempts. While brushing teeth, requires mod verbal cues to use bil. hands to replace and remove top on toothpaste. pt brushed teeth with Rt. UE for ~75% of task. Difficulty sequencing and timing rinsing and spitting of water.  Cognition:  Cognition  Overall Cognitive Status: Impaired/Different from baseline  Arousal/Alertness: Awake/alert  Orientation Level: Oriented X4  Attention: Sustained  Sustained Attention: Impaired  Sustained Attention Impairment: Functional basic  Memory: Impaired  Memory Impairment: (pt admits to memory changes, did not recall testing results)  Problem Solving: Impaired  Problem Solving Impairment: Functional basic  Cognition  Arousal/Alertness: Awake/alert  Behavior During Therapy: WFL for tasks assessed/performed  Overall Cognitive Status: Impaired/Different from baseline  Area of Impairment: Attention;Awareness;Problem solving  Current Attention Level: Selective  Safety/Judgement: Decreased awareness of deficits;Decreased awareness of safety  Awareness: Intellectual  Problem Solving: Decreased initiation;Difficulty sequencing;Requires verbal cues;Requires tactile cues  General Comments: pt's impulsivity continues, though remains less and manageable if he is cued.  Communication between L and R brain remains hampered. Neglect remains significant, with pt appearing to use his mouth interchangeably with his R hand or as a go between to help the transition to initiating with the R Ue    Physical Exam:  Blood pressure 159/88, pulse 63, temperature 98.3 F (36.8 C), temperature source Oral, resp. rate 18, height 5\' 9"  (1.753 m), weight 83.915 kg (185 lb), SpO2 100.00%.    HENT:  Head: Normocephalic.  Eyes: EOM are normal.  Neck: Neck supple. No  thyromegaly present.  Cardiovascular: Normal rate and regular rhythm.  Pulmonary/Chest: Effort normal and breath sounds normal. No respiratory distress.  Abdominal: Soft. Bowel sounds are normal. He exhibits no distension.  Neurological: He is alert.  Patient with word finding deficits, verbal apraxia. He was able to state his name but was inconsistent with his home living situation. Identified multiple objects for me.   He did follow simple commands. RUE is tr to 3+/5 and inconsistent. RLE is trace to 1 at HF and KE and 0/5 distally with inconsistency as well and likely apraxia. Decreased sensation to light touch but could grossly sense pain.  Skin: Skin is warm and dry.  Psychiatric:  Very flat  No results found for this or any previous visit (from the past 48 hour(s)).  No results found.  Post Admission Physician Evaluation:  1. Functional deficits secondary to embolic left anterior circulation infarct. 2. Patient is admitted to receive collaborative, interdisciplinary care between the physiatrist, rehab nursing staff, and therapy team. 3. Patient's level of medical complexity and substantial therapy needs in context of that medical necessity cannot be provided at a lesser intensity of care such as a SNF. 4. Patient has experienced substantial functional loss from his/her baseline which was documented above under the "Functional History" and "Functional Status" headings. Judging by the patient's diagnosis, physical exam, and functional history, the patient has potential for functional progress which will result in measurable gains while on inpatient rehab. These gains will be of substantial and practical use upon discharge in facilitating mobility and self-care at the household level. 5. Physiatrist will provide 24 hour management of medical needs as well as oversight of the therapy plan/treatment and provide guidance as appropriate regarding the interaction of the two. 6. 24 hour rehab nursing  will assist with bladder management, bowel management, safety, skin/wound care, disease management, medication administration and patient education and help integrate therapy concepts, techniques,education, etc. 7. PT will assess and treat for/with: Lower extremity strength, range of motion, stamina, balance, functional mobility, safety, adaptive techniques and equipment, NMR, cognitive-linguistic impacts upon mobility. Goals are: supervision to minimal assisgt. 8. OT will assess and treat for/with: ADL's, functional mobility, safety, upper extremity strength, adaptive techniques and equipment, NMR, cognitive perceptual rx, education. Goals are: supervision to minimal assist. 9. SLP will assess and treat for/with: language, cognition, communication. Goals are: supervision to minimal assist. 10. Case Management and Social Worker will assess and treat for psychological issues and discharge planning. 11. Team conference will be held weekly to assess progress toward goals and to determine barriers to discharge. 12. Patient will receive at least 3 hours of therapy per day at least 5 days per week. 13. ELOS: 2-3 weeks  Prognosis: excellent Medical Problem List and Plan:  1. 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 DVT Prophylaxis/Anticoagulation: Subcutaneous Lovenox. Monitor platelet counts any signs of bleeding  3. Neuropsych: This patient is not capable of making decisions on his/her own behalf.  4. Hypertension. No present antihypertensive medication. Patient on hydrochlorothiazide 25 mg daily prior to admission. Monitor with increased activity  5. Tobacco/marijuana abuse. Continue NicoDerm patch and provide counseling  6. Hyperlipidemia. Lipitor            Ranelle Oyster, MD, [facility] Physical Medicine & Rehabilitation  02/09/2013

## 2013-02-09 NOTE — Progress Notes (Signed)
Insurance has approved pt to be admitted to IP rehab today. Wife and pt in agreement. [phone]

## 2013-02-09 NOTE — Progress Notes (Signed)
PT Cancellation Note  Patient Details Name: Desmund Elman MRN: [id number] DOB: Sep 25, 1955   Cancelled Treatment:    Reason Eval/Treat Not Completed: Other (comment) (pt refused this pm)  02/09/2013  [place] Bing, PT [phone]  (pager) Jennae Hakeem, Eliseo Gum 02/09/2013, 3:22 PM

## 2013-02-10 ENCOUNTER — Inpatient Hospital Stay (HOSPITAL_COMMUNITY): Payer: 59 | Admitting: Speech Pathology

## 2013-02-10 ENCOUNTER — Encounter (HOSPITAL_COMMUNITY): Payer: Self-pay | Admitting: Cardiology

## 2013-02-10 ENCOUNTER — Inpatient Hospital Stay (HOSPITAL_COMMUNITY): Payer: 59 | Admitting: Physical Therapy

## 2013-02-10 ENCOUNTER — Inpatient Hospital Stay (HOSPITAL_COMMUNITY): Payer: 59 | Admitting: Occupational Therapy

## 2013-02-10 DIAGNOSIS — I6340 Cerebral infarction due to embolism of unspecified cerebral artery: Secondary | ICD-10-CM

## 2013-02-10 HISTORY — DX: Cerebral infarction due to embolism of unspecified cerebral artery: I63.40

## 2013-02-10 LAB — FACTOR 5 LEIDEN

## 2013-02-10 LAB — LUPUS ANTICOAGULANT PANEL: DRVVT: 36.2 secs (ref <42.9)

## 2013-02-10 LAB — CBC WITH DIFFERENTIAL/PLATELET
Basophils Absolute: 0 10*3/uL (ref 0.0–0.1)
Basophils Relative: 0 % (ref 0–1)
Eosinophils Absolute: 0.2 10*3/uL (ref 0.0–0.7)
MCH: 28.7 pg (ref 26.0–34.0)
MCHC: 34.1 g/dL (ref 30.0–36.0)
Neutro Abs: 6.4 10*3/uL (ref 1.7–7.7)
Neutrophils Relative %: 64 % (ref 43–77)
RDW: 13.5 % (ref 11.5–15.5)

## 2013-02-10 LAB — HOMOCYSTEINE: Homocysteine: 10.6 umol/L (ref 4.0–15.4)

## 2013-02-10 LAB — COMPREHENSIVE METABOLIC PANEL
AST: 26 U/L (ref 0–37)
Albumin: 3.3 g/dL — ABNORMAL LOW (ref 3.5–5.2)
BUN: 17 mg/dL (ref 6–23)
Chloride: 102 mEq/L (ref 96–112)
Creatinine, Ser: 1.14 mg/dL (ref 0.50–1.35)
Potassium: 4.1 mEq/L (ref 3.5–5.1)
Total Bilirubin: 0.5 mg/dL (ref 0.3–1.2)
Total Protein: 6.8 g/dL (ref 6.0–8.3)

## 2013-02-10 LAB — BETA-2-GLYCOPROTEIN I ABS, IGG/M/A
Beta-2 Glyco I IgG: 0 G Units (ref <20)
Beta-2-Glycoprotein I IgA: 1 A Units (ref <20)
Beta-2-Glycoprotein I IgM: 0 M Units (ref <20)

## 2013-02-10 LAB — PROTEIN S ACTIVITY: Protein S Activity: 94 % (ref 69–129)

## 2013-02-10 MED ORDER — LISINOPRIL 10 MG PO TABS
10.0000 mg | ORAL_TABLET | Freq: Every day | ORAL | Status: DC
  Administered 2013-02-10 – 2013-02-25 (×16): 10 mg via ORAL
  Filled 2013-02-10 (×17): qty 1

## 2013-02-10 MED ORDER — ENSURE COMPLETE PO LIQD
120.0000 mL | Freq: Three times a day (TID) | ORAL | Status: DC
  Administered 2013-02-10 – 2013-02-11 (×5): 120 mL via ORAL
  Administered 2013-02-12: 237 mL via ORAL
  Administered 2013-02-12 – 2013-02-17 (×9): 120 mL via ORAL

## 2013-02-10 NOTE — Progress Notes (Signed)
Social Work Assessment and Plan Social Work Assessment and Plan  Patient Details  Name: [name] MRN: [id number] Date of Birth: 30-May-1956  Today's Date: 02/10/2013  Problem List:  Patient Active Problem List   Diagnosis Date Noted  . 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 CVA (cerebral infarction) 02/03/2013  . HTN (hypertension) 02/03/2013  . Tobacco abuse 02/03/2013   Past Medical History:  Past Medical History  Diagnosis Date  . Hypertension   . Anginal pain   . Heart murmur   . Stroke 02/01/2013    left frontal CVA and symptoms of right arm and leg weakness and expressive aphasia/notes 02/03/2013 (02/03/2013)  . Psoriasis     "back and stomach" (02/03/2013)   Past Surgical History:  Past Surgical History  Procedure Laterality Date  . No past surgeries     Social History:  reports that he has been smoking Cigarettes.  He has a 9.25 pack-year smoking history. He does not have any smokeless tobacco history on file. He reports that he drinks about 10.2 ounces of alcohol per week. He reports that he does not use illicit drugs.  Family / Support Systems Marital Status: Married Patient Roles: Spouse;Parent Spouse/Significant Other: Janet-wife  [phone] Children: Danae Orleans  [phone] Anticipated Caregiver: Wife Ability/Limitations of Caregiver: Wife works form home and can provide assist if needed Caregiver Availability: 24/7 Family Dynamics: Close knit family who are involved and supportive of one another.  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  Will look toward wife to make pt's  decisions while here-since no formal POA   Abuse/Neglect Physical Abuse: Denies Verbal Abuse: Denies Sexual Abuse: Denies Exploitation of patient/patient's resources: Denies Self-Neglect: Denies  Emotional Status Pt's affect, behavior adn adjustment status: Pt is motivated and tired from therapies.  Wife is here and attending to pt washing his face and brushing his teeth.  She reports: " He is a hard worke rand will do what he needs to do to recover from this." Recent Psychosocial Issues: Other medical issues Pyschiatric History: No history-deferred depression screen due to speech deficits.  Wife feels he may becoming depressed due to stroke and deficits.  Will monitor and have Neuro-psych see when appropriate.  To assist with his coping and substance abuse. Substance Abuse History: Positive for Marijuana-wife reports he does every once in awhile.  She is awar ehe needs to quit and also try to quit tobacco.  Will get resources for wife  Patient / Family Perceptions, Expectations & Goals Pt/Family understanding of illness & functional limitations: Wife has a good understanding of husband's condition and deficits.  She is here daily and talks with the MD regarding his progress and treatment plan.  Pt points to his weak side and answers yes when asked. Premorbid pt/family roles/activities: Husband, Retiree, Father, Home owner, etc Anticipated changes in roles/activities/participation: resume Pt/family expectations/goals: Wife states: " I hope he can do as much as he can for himself, but the time he leaves here."  Pt states: " Do well."  [employer]: None Premorbid Home Care/DME Agencies: None Transportation available at discharge: Wife Resource referrals recommended: Support group (specify) (CVA Support group)  Discharge Planning Living Arrangements: Spouse/significant other Support Systems:  Spouse/significant other;Children;Other  relatives;Friends/neighbors;Church/faith community Type of Residence: Private residence Insurance Resources: Media planner (specify) Education officer, museum) Financial Resources: Family Support;Other (Comment) Immunologist from [employer]) Financial Screen Referred: No Living Expenses: Lives with family Money Management: Patient;Spouse Do you have any problems obtaining your medications?: No Home Management: Wife Patient/Family Preliminary Plans: Return home with wife who can provide assist.  She works from home and is available to assist him.  Son is supportive but does not live there. Social Work Anticipated Follow Up Needs: HH/OP;Support Group  Clinical Impression Pleasant gentleman who has speech deficits.  Wife is supportive and seems to be hands on with his care.  She can provide assist if necessary. Informed team conf not held today due to evaluations being done but ballpark of 2-3 weeks here.  Wife to be here frequently, which was encouraged by this worker.  Lucy Chris 02/10/2013, 12:20 PM

## 2013-02-10 NOTE — Progress Notes (Signed)
Patient information reviewed and entered into eRehab system by Damontay Alred, RN, CRRN, PPS Coordinator.  Information including medical coding and functional independence measure will be reviewed and updated through discharge.    

## 2013-02-10 NOTE — Care Management Note (Signed)
    Page 1 of 1   02/10/2013     8:16:00 AM   CARE MANAGEMENT NOTE 02/10/2013  Patient:  [name],[name]   Account Number:  [id number]  Date Initiated:  02/04/2013  Documentation initiated by:  Elmer Bales  Subjective/Objective Assessment:   Pt admitted for CVA. Lives at home with wife, previously independent     Action/Plan:   PT/OT evals. OT recommending CIR, PT eval pending   Anticipated DC Date:     Anticipated DC Plan:  IP REHAB FACILITY      DC Planning Services  CM consult      Choice offered to / List presented to:             Status of service:  Completed, signed off Medicare Important Message given?   (If response is "NO", the following Medicare IM given date fields will be blank) Date Medicare IM given:   Date Additional Medicare IM given:    Discharge Disposition:  IP REHAB FACILITY  Per UR Regulation:  Reviewed for med. necessity/level of care/duration of stay  If discussed at Long Length of Stay Meetings, dates discussed:    Comments:

## 2013-02-10 NOTE — Progress Notes (Signed)
Subjective/Complaints: No complaints. Able to sleep. Hungry this am A 12 point review of systems has been performed and if not noted above is otherwise negative.   Objective: Vital Signs: Blood pressure 158/104, pulse 82, temperature 98.1 F (36.7 C), temperature source Oral, resp. rate 17, height 5\' 9"  (1.753 m), weight 83.5 kg (184 lb 1.4 oz), SpO2 98.00%. No results found.  Recent Labs  02/09/13 2100 02/10/13 0553  WBC 10.1 9.9  HGB 14.5 14.6  HCT 42.1 42.8  PLT 276 269    Recent Labs  02/09/13 2100 02/10/13 0553  NA  --  138  K  --  4.1  CL  --  102  GLUCOSE  --  99  BUN  --  17  CREATININE 1.10 1.14  CALCIUM  --  9.4   CBG (last 3)  No results found for this basename: GLUCAP,  in the last 72 hours  Wt Readings from Last 3 Encounters:  02/09/13 83.5 kg (184 lb 1.4 oz)  02/03/13 83.915 kg (185 lb)  02/03/13 83.915 kg (185 lb)    Physical Exam:  HENT:  Head: Normocephalic.  Eyes: EOM are normal.  Neck: Neck supple. No thyromegaly present.  Cardiovascular: Normal rate and regular rhythm.  Pulmonary/Chest: Effort normal and breath sounds normal. No respiratory distress.  Abdominal: Soft. Bowel sounds are normal. He exhibits no distension.  Neurological: He is alert.  Patient with word finding deficits, verbal apraxia. He was able to state his name  Identified multiple objects for me. He did follow simple commands. RUE is tr to 3+/5 and inconsistent. RLE is trace to 1 at HF and KE and 0/5 distally with inconsistency as well and likely apraxia. Decreased sensation to light touch but could grossly sense pain.  Skin: Skin is warm and dry.  Psychiatric:  Very flat    Assessment/Plan: 1. 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 Physiatrist is providing close team supervision and 24 hour management of active medical problems  listed below. Physiatrist and rehab team continue to assess barriers to discharge/monitor patient progress toward functional and medical goals. FIM:                   Comprehension Comprehension Mode: Auditory Comprehension: 5-Understands complex 90% of the time/Cues < 10% of the time  Expression Expression Mode: Verbal Expression: 5-Expresses basic needs/ideas: With extra time/assistive device        Memory Memory: 5-Recognizes or recalls 90% of the time/requires cueing < 10% of the time  Medical Problem List and Plan:  1. 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 DVT Prophylaxis/Anticoagulation: Subcutaneous Lovenox. Monitor platelet counts any signs of bleeding  3. Neuropsych: This patient is not capable of making decisions on his/her own behalf.  4. Hypertension. No present antihypertensive medication. Patient on hydrochlorothiazide 25 mg daily prior to admission. Begin low dose ACE to start/ don't want to lower pressure too much 5. Tobacco/marijuana abuse. Continue NicoDerm patch and provide counseling  6. Hyperlipidemia. Lipitor   LOS (Days) 1 A FACE TO FACE EVALUATION WAS PERFORMED  [name] T 02/10/2013 8:48 AM

## 2013-02-10 NOTE — Progress Notes (Signed)
Bed alarm going off; RN went into room and found wife to be ambulating patient. Patient stumbling, attempting to get to bathroom. Two RNs and NT able to stabilize patient and get him to the wheelchair to go to the bathroom. Patient very combative, swung at one RN and verbalizing agitation toward the other staff members. Staff members able to get patient to and from toilet and back in bed, reset bed alarm. Patient and wife educated that staff needs to be with patient if he needs to transfer anywhere. Patient able to show RN the call button on his call light. Deatra Ina, PA, notified of incident and patient's agitation and treatment of staff. Continuing to monitor patient and encouraging wife and patient to call for assistance.

## 2013-02-10 NOTE — Evaluation (Signed)
Speech Language Pathology Assessment and Plan  Patient Details  Name: Bernis Schreur MRN: [id number] Date of Birth: 03-01-1956  SLP Diagnosis: Aphasia;Cognitive Impairments  Rehab Potential: Excellent ELOS: 2-3 weeks   Today's Date: 02/10/2013 Time: 5621-3086 Time Calculation (min): 53 min  Skilled Therapeutic Intervention: Administered cognitive-linguistic evaluation. Please see below for details.   Problem List:  Patient Active Problem List   Diagnosis Date Noted  . 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 CVA (cerebral infarction) 02/03/2013  . HTN (hypertension) 02/03/2013  . Tobacco abuse 02/03/2013   Past Medical History:  Past Medical History  Diagnosis Date  . Hypertension   . Anginal pain   . Heart murmur   . Stroke 02/01/2013    left frontal CVA and symptoms of right arm and leg weakness and expressive aphasia/notes 02/03/2013 (02/03/2013)  . Psoriasis     "back and stomach" (02/03/2013)   Past Surgical History:  Past Surgical History  Procedure Laterality Date  . No past surgeries      Assessment / Plan / Recommendation Clinical Impression  Pt is a 57 y.o. right-handed male with history of hypertension as well as tobacco abuse. Admitted 02/03/2013 with right-sided weakness and aphasia. MRI of the brain showed acute infarct throughout the left anterior cerebral artery territory. MRA of the head with abrupt occlusion of the left anterior cerebral artery 8 mm beyond its origin. UDS positive for marijuana. Patient did not receive TPA. EEG consistent with focal neurological dysfunction involving the left cerebral hemisphere notably the left temporal parietal region. Echocardiogram with ejection fraction of 65% without embolism. Carotid Dopplers with no ICA stenosis. Neurology services consulted placed on aspirin for stroke prophylaxis as well as subcutaneous Lovenox for DVT prophylaxis. TEE 02/09/2013 showed normal LV function and no thrombus without PFO. Physical and  occupational therapy evaluations completed recommendations for physical medicine rehabilitation consult to consider inpatient rehabilitation services. Patient was felt to be a good candidate for inpatient rehabilitation services and was admitted for comprehensive rehabilitation program on 02/09/13. Pt presents with moderate aphasia characterized by decreased auditory comprehension with abstract and complex information. Pt's ability to follow commands is also impacted by pt's motor apraxia. Pt's verbal expression is characterized by phrase level responses with decreased fluency and decreased ability to self-monitor and correct errors. Pt also demonstrates moderate cognitive impairments characterized by decreased problem solving, working Aeronautical engineer. Pt would benefit from skilled SLP intervention to maximize verbal expression and cognitive recovery to increase pt's overall functional independence.     SLP Assessment  Patient will need skilled Speech Lanaguage Pathology Services during CIR admission    Recommendations  Patient destination: Home Follow up Recommendations: 24 hour supervision/assistance;Outpatient SLP Equipment Recommended: None recommended by SLP    SLP Frequency 5 out of 7 days   SLP Treatment/Interventions Cognitive remediation/compensation;Cueing hierarchy;Functional tasks;Environmental controls;Internal/external aids;Speech/Language facilitation;Therapeutic Activities;Patient/family education    Pain Pain Assessment Pain Assessment: No/denies pain  Short Term Goals: Week 1: SLP Short Term Goal 1 (Week 1): Pt will utilize call bell to request assistance with Min A verbal and question cues.  SLP Short Term Goal 2 (Week 1): Pt will utilize external memory aids to increase recall/carryover of newly learned informaiton with Min A verbal and question cues.  SLP Short Term Goal 3 (Week 1): Pt will demonstrate functional problem solving with basic and familair tasks with  Min A verbal and question cues.  SLP Short Term Goal 4 (Week 1): Pt will self-monitor and correct verbal errors at the sentence  level with Min A verbal cues. SLP Short Term Goal 5 (Week 1): Pt will utilize word-finding strategies with Min A verbal and question cues.   See FIM for current functional status Refer to Care Plan for Long Term Goals  Recommendations for other services: Neuropsych  Discharge Criteria: Patient will be discharged from SLP if patient refuses treatment 3 consecutive times without medical reason, if treatment goals not met, if there is a change in medical status, if patient makes no progress towards goals or if patient is discharged from hospital.  The above assessment, treatment plan, treatment alternatives and goals were discussed and mutually agreed upon: by patient  Braxson Hollingsworth 02/10/2013, 4:14 PM

## 2013-02-10 NOTE — Evaluation (Signed)
Physical Therapy Assessment and Plan  Patient Details  Name: [name] MRN: [id number] Date of Birth: Feb 27, 1956  PT Diagnosis: Abnormality of gait, Cognitive deficits, Difficulty walking, Hemiplegia dominant, Hypertonia, Impaired sensation and Muscle weakness Rehab Potential: Good ELOS: 2-3 weeks   Today's Date: 02/10/2013 Time: 1020-1110 Time Calculation (min): 50 min  Problem List:  Patient Active Problem List   Diagnosis Date Noted  . 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 CVA (cerebral infarction) 02/03/2013  . HTN (hypertension) 02/03/2013  . Tobacco abuse 02/03/2013    Past Medical History:  Past Medical History  Diagnosis Date  . Hypertension   . Anginal pain   . Heart murmur   . Stroke 02/01/2013    left frontal CVA and symptoms of right arm and leg weakness and expressive aphasia/notes 02/03/2013 (02/03/2013)  . Psoriasis     "back and stomach" (02/03/2013)   Past Surgical History:  Past Surgical History  Procedure Laterality Date  . No past surgeries      Assessment & Plan Clinical Impression: Patient is a 57 y.o. right-handed male with history of hypertension as well as tobacco abuse. Admitted 02/03/2013 with right-sided weakness and aphasia. MRI of the brain showed acute infarct throughout the left anterior cerebral artery territory. MRA of the head with abrupt occlusion of the left anterior cerebral artery 8 mm beyond its origin. UDS positive for marijuana. Patient did not receive TPA. EEG consistent with focal neurological dysfunction involving the left cerebral hemisphere notably the left temporal parietal region. Echocardiogram with ejection fraction of 65% without embolism. Carotid Dopplers with no ICA stenosis. Neurology services consulted placed on aspirin for stroke prophylaxis as well as subcutaneous Lovenox for DVT prophylaxis. TEE 02/09/2013 showed normal LV function and no thrombus without PFO.  Patient transferred to CIR on 02/09/2013 .   Patient  currently requires max with mobility secondary to muscle weakness, significant HTN with activity, impaired timing and sequencing, abnormal tone, motor apraxia, decreased coordination and decreased motor planning, decreased attention to right, decreased initiation, decreased attention, decreased awareness and decreased problem solving and decreased sitting balance, decreased standing balance, decreased postural control, hemiplegia and decreased balance strategies.  Prior to hospitalization, patient was independent  with mobility and lived with Spouse in a House home.  Home access is no stepsLevel entry.  Patient will benefit from skilled PT intervention to maximize safe functional mobility, minimize fall risk and decrease caregiver burden for planned discharge home with 24 hour supervision.  Anticipate patient will benefit from follow up HH at discharge.  PT - End of Session Activity Tolerance: Tolerates < 10 min activity with changes in vital signs Endurance Deficit: Yes Endurance Deficit Description: activity causes elevation in BP PT Assessment Rehab Potential: Good Barriers to Discharge: Decreased caregiver support PT Plan PT Intensity: Minimum of 1-2 x/day ,45 to 90 minutes PT Frequency: 5 out of 7 days PT Duration Estimated Length of Stay: 2-3 weeks PT Treatment/Interventions: Ambulation/gait training;Balance/vestibular training;Cognitive remediation/compensation;Discharge planning;Disease management/prevention;DME/adaptive equipment instruction;Functional mobility training;Neuromuscular re-education;Patient/family education;Splinting/orthotics;Stair training;Therapeutic Activities;Therapeutic Exercise;UE/LE Strength taining/ROM;UE/LE Coordination activities;Visual/perceptual remediation/compensation;Wheelchair propulsion/positioning PT Recommendation Recommendations for Other Services: Neuropsych consult Follow Up Recommendations: Home health PT;24 hour supervision/assistance Patient  destination: Home Equipment Recommended: Wheelchair (measurements);Rolling walker with 5" wheels  Skilled Therapeutic Intervention Following gait and stairs reassessed patient's vitals: BP: 180/120 compared to 160/101 at beginning of session; alerted RN who alerted PA.  Patient asymptomatic but activity ceased secondary to jump in BP with activity.  Returned to bed with max A stand pivot and min  A sit > supine.   PT Evaluation Precautions/Restrictions Precautions Precautions: Fall Precaution Comments: Pt impulsive with poor awareness of deficits; Rt. inattention Restrictions Weight Bearing Restrictions: No General Chart Reviewed: Yes Amount of Missed PT Time (min): 10 Minutes Missed Time Reason: Other (comment) (elevated BP 180/120 with activity) Response to Previous Treatment: Patient with no complaints from previous session. Family/Caregiver Present: No  Vital SignsTherapy Vitals Pulse Rate: 80 BP: 180/120 mmHg (sitting down after therapy activity) Patient Position, if appropriate: Sitting Pain Pain Assessment Pain Assessment: No/denies pain Home Living/Prior Functioning Home Living Lives With: Spouse Available Help at Discharge: Family;Available PRN/intermittently Type of Home: House Home Access: Level entry Entrance Stairs-Number of Steps: no steps Home Layout: One level Bathroom Shower/Tub: Engineer, manufacturing systems: Standard Bathroom Accessibility: Yes Home Adaptive Equipment: None Additional Comments: Pt reports wife works during day and won't have assistance during the day Prior Function Level of Independence: Independent with gait;Independent with transfers Able to Take Stairs?: Yes Driving: Yes Vocation: Unemployed Vision/Perception  Vision - History Baseline Vision: No visual deficits Patient Visual Report: No change from baseline Vision - Assessment Eye Alignment: Within Functional Limits Vision Assessment: Vision tested Tracking/Visual Pursuits:  Decreased smoothness of vertical tracking;Decreased smoothness of horizontal tracking;Requires cues, head turns, or add eye shifts to track;Impaired - to be further tested in functional context Saccades: Additional head turns occurred during testing;Undershoots Visual Fields: No apparent deficits Additional Comments: Pt with decreased visual attention Perception Perception: Impaired Inattention/Neglect: Does not attend to right side of body Praxis Praxis: Impaired Praxis Impairment Details: Motor planning Praxis-Other Comments: Inconsistent movement, pt unable to lift up RUE on command, however in functional task pt able to reach for item.  Also with bathing, pt attempted to grasp washcloth with RUE but then unable to release despite trying to pull washcloth from Rt hand.  Cues to visually attend to RUE with grasp and release.  Cognition Overall Cognitive Status: Impaired/Different from baseline Arousal/Alertness: Awake/alert Attention: Sustained Sustained Attention: Impaired Memory: Impaired Awareness: Impaired Problem Solving: Impaired Problem Solving Impairment: Functional basic Safety/Judgment: Impaired Sensation Sensation Light Touch: Impaired Detail Light Touch Impaired Details: Impaired RUE Stereognosis: Not tested Hot/Cold: Not tested Proprioception: Impaired by gross assessment Additional Comments: Patient reports bilat LE intact to light touch but presents with L extinction; reports greater sensation to light touch L > RLE Coordination Gross Motor Movements are Fluid and Coordinated: No Fine Motor Movements are Fluid and Coordinated: No Coordination and Movement Description: Limited by hemiplegia and apraxia Finger Nose Finger Test: limited by decreased shoulder AROM, undershooting with RUE Motor  Motor Motor: Hemiplegia;Motor apraxia;Abnormal tone;Motor perseverations Motor - Skilled Clinical Observations: R hemiplegia with ?R extensor tone vs. motor perseverations;  motor apraxia  Mobility Bed Mobility Bed Mobility: Supine to Sit;Sit to Supine Supine to Sit: With rails;4: Min assist;HOB elevated Sit to Supine: 4: Min assist;HOB flat Transfers Stand Pivot Transfers: 2: Max Actuary Details (indicate cue type and reason): Mod A stand pivot bed <> w/c with verbal and visual cues for safe hand placement to stand, lifting assistance and significant physical assistance to maintain balance and advance/pivot RLE and for controlled lowering assistance Locomotion  Ambulation Ambulation/Gait Assistance: 1: +2 Total assist Ambulation Distance (Feet): 50 Feet Assistive device: Rolling walker Ambulation/Gait Assistance Details: Required +2 A and use of RW in controlled environment with one person assisting with lateral weight shift and trunk control and safe management of RW with second person assisting with R hip and knee flexion to initiate  swing phase.  Patient's RLE flaccid at rest in sitting but during standing presents with ? extensor tone and impaired motor planning, sequencing and initiation; no buckling or genu recurvatum noted Gait Gait Pattern: Step-to pattern;Decreased step length - right;Decreased stance time - right;Decreased stride length;Decreased hip/knee flexion - right;Decreased dorsiflexion - right;Narrow base of support;Trunk rotated posteriorly on right;Decreased weight shift to left Stairs / Additional Locomotion Stairs: Yes Stairs Assistance: 2: Max Estate agent Assistance Details (indicate cue type and reason): Attempted to use stairs for automatic stepping pattern in RLE but patient proceeded to advance with LLE to ascend and descend; patient unable to initiate or sequence advancement of RLE despite tactile and verbal cues; required max A for balance and to advance RLE to next step.   Stair Management Technique: Two rails;Step to pattern;Forwards Number of Stairs: 3 Height of Stairs: 4 Wheelchair Mobility Wheelchair  Mobility: No Distance: 150  Trunk/Postural Assessment  Cervical Assessment Cervical Assessment: Within Functional Limits Thoracic Assessment Thoracic Assessment: Within Functional Limits Lumbar Assessment Lumbar Assessment: Within Functional Limits Postural Control Postural Control: Deficits on evaluation  Balance Static Sitting Balance Static Sitting - Balance Support: Left upper extremity supported;Feet supported Static Sitting - Level of Assistance: 5: Stand by assistance Dynamic Sitting Balance Dynamic Sitting - Balance Support: Feet supported;Left upper extremity supported Dynamic Sitting - Level of Assistance: 4: Min assist Static Standing Balance Static Standing - Balance Support: Right upper extremity supported;Left upper extremity supported Static Standing - Level of Assistance: 4: Min assist Dynamic Standing Balance Dynamic Standing - Balance Support: Right upper extremity supported;Left upper extremity supported Dynamic Standing - Level of Assistance: 1: +2 Total assist Extremity Assessment  RUE Assessment RUE Assessment: Exceptions to [facility] RUE AROM (degrees) RUE Overall AROM Comments: Shoulder flexion approx 60 degrees, elbow flexion 5-120 degrees, wrist flexion/extension WFL, finger flexion/extension WFL, increased time and focus for each movement RUE PROM (degrees) RUE Overall PROM Comments: pt reports slight shoulder pain/tightness greater than 90 degrees RUE Strength RUE Overall Strength Comments: inconsistent grossly 2/5 shoulder and 4/5 grasp - decreased ability to release grasp LUE Assessment LUE Assessment: Within Functional Limits RLE Assessment RLE Assessment: Exceptions to [employer] RLE Strength RLE Overall Strength: Deficits;Due to impaired cognition RLE Overall Strength Comments: 0/5 MMT but unable to assess true strength secondary to apraxia, aphasia and impaired initiation.  Good activation of RLE extensors during WB; ? extensor tone? LLE Assessment LLE  Assessment: Within Functional Limits  FIM:  FIM - Bed/Chair Transfer Bed/Chair Transfer Assistive Devices: Bed rails;HOB elevated Bed/Chair Transfer: 4: Supine > Sit: Min A (steadying Pt. > 75%/lift 1 leg);4: Sit > Supine: Min A (steadying pt. > 75%/lift 1 leg);2: Bed > Chair or W/C: Max A (lift and lower assist);2: Chair or W/C > Bed: Max A (lift and lower assist) FIM - Locomotion: Wheelchair Distance: 150 Locomotion: Wheelchair: 1: Total Assistance/staff pushes wheelchair (Pt<25%) FIM - Locomotion: Ambulation Locomotion: Ambulation Assistive Devices: Designer, industrial/product Ambulation/Gait Assistance: 1: +2 Total assist Locomotion: Ambulation: 1: Two helpers FIM - Locomotion: Stairs Locomotion: Building control surveyor: Radio broadcast assistant - 2 Locomotion: Stairs: 1: Up and Down < 4 stairs with maximal assistance (Pt: 25 - 49%)   Refer to Care Plan for Long Term Goals  Recommendations for other services: Neuropsych  Discharge Criteria: Patient will be discharged from PT if patient refuses treatment 3 consecutive times without medical reason, if treatment goals not met, if there is a change in medical status, if patient makes no progress towards goals or if  patient is discharged from hospital.  The above assessment, treatment plan, treatment alternatives and goals were discussed and mutually agreed upon: by patient and No family available/patient unable  Edman Circle [facility] 02/10/2013, 11:34 AM

## 2013-02-10 NOTE — Progress Notes (Signed)
Occupational Therapy Session Note  Patient Details  Name: [name] MRN: [id number] Date of Birth: [birth year]  Today's Date: 02/10/2013 Time: 4098-1191 Time Calculation (min): 32 min  Skilled Therapeutic Interventions/Progress Updates:   Pt performed dressing at start of session, donning shorts and shirt.  Min assist with mod instructional cueing to begin with the right UE and LE during these tasks.  Pt performed short distance mobility with RW and total assist to advance the LLE in order to transfer to the wheelchair.  Down in the gym used the UE ergonometer for 12 mins to increase LUE strength and reciprical motion.  Pt able to perform 2 sets of 3 mins on level 8 resistance using bilateral UEs and one set of 3 mins using just the LUE with max facilitation.  Transitioned to standing at the high/low table and working on table slides with the RUE forward backwards and side to side.  Pt with some receptive difficulties when therapist was trying to demonstrate technique.  Needed initial max demonstrational cueing to understand and follow technique.    Therapy Documentation Precautions:  Precautions Precautions: Fall Precaution Comments: Pt impulsive with poor awareness of deficits; Rt. inattention Restrictions Weight Bearing Restrictions: No  Pain: Pain Assessment Pain Assessment: No/denies pain ADL: See FIM for current functional status  Therapy/Group: Individual Therapy  [name] OTR/L 02/10/2013, 2:38 PM

## 2013-02-10 NOTE — Care Management Note (Signed)
[facility] Individual Statement of Services  Patient Name:  [name]  Date:  02/10/2013  Welcome to the [facility].  Our goal is to provide you with an individualized program based on your diagnosis and situation, designed to meet your specific needs.  With this comprehensive rehabilitation program, you will be expected to participate in at least 3 hours of rehabilitation therapies Monday-Friday, with modified therapy programming on the weekends.  Your rehabilitation program will include the following services:  Physical Therapy (PT), Occupational Therapy (OT), Speech Therapy (ST), 24 hour per day rehabilitation nursing, Therapeutic Recreaction (TR), Neuropsychology, Case Management ( Social Worker), Rehabilitation Medicine, Nutrition Services and Pharmacy Services  Weekly team conferences will be held on Wednesday to discuss your progress.  Your Social Worker will talk with you frequently to get your input and to update you on team discussions.  Team conferences with you and your family in attendance may also be held.  Expected length of stay: 2-3 weeks Overall anticipated outcome: supervision/min level  Depending on your progress and recovery, your program may change. Your Social Worker will coordinate services and will keep you informed of any changes. Your Child psychotherapist names and contact numbers are listed  below.  The following services may also be recommended but are not provided by the [facility]:   Driving Evaluations  Home Health Rehabiltiation Services  Outpatient Rehabilitatation Servives    Arrangements will be made to provide these services after discharge if needed.  Arrangements include referral to agencies that provide these services.  Your insurance has been verified to be:  [facility] Your primary doctor is:  Dr. Melba Coon  Pertinent information will be shared with your doctor and your insurance  company.  Social Worker:  Dossie Der, [place] [phone]  Information discussed with and copy given to patient by: Lucy Chris, 02/10/2013, 12:05 PM

## 2013-02-10 NOTE — Plan of Care (Signed)
Overall Plan of Care [facility]) Patient Details Name: [name] MRN: [id number] DOB: 22-Sep-1955  Diagnosis:  Embolic left anterior circulation infarct  Co-morbidities: htn, depression  Functional Problem List  Patient demonstrates impairments in the following areas: Balance, Behavior, Cognition, Endurance, Linguistic, Medication Management, Motor, Nutrition, Perception, Safety, Sensory , Skin Integrity and Vision  Basic ADL's: eating, grooming, bathing, dressing and toileting Advanced ADL's: simple meal preparation  Transfers:  bed mobility, bed to chair, toilet, tub/shower, car and floor Locomotion:  ambulation, wheelchair mobility and stairs  Additional Impairments:  Functional use of upper extremity, Communication  expression and Social Cognition   problem solving, memory and awareness  Anticipated Outcomes Item Anticipated Outcome  Eating/Swallowing    Basic self-care  Supervision/min assist  Tolieting  Supervision/min assist  Bowel/Bladder  Continent of bowel and bladder  Transfers  supervision  Locomotion  Min A  Communication  Supervision  Cognition  Supervision  Pain  Pain level at or below 2  Safety/Judgment  Supervision  Other     Therapy Plan: PT Intensity: Minimum of 1-2 x/day ,45 to 90 minutes PT Frequency: 5 out of 7 days PT Duration Estimated Length of Stay: 2-3 weeks OT Intensity: Minimum of 1-2 x/day, 45 to 90 minutes OT Frequency: 5 out of 7 days OT Duration/Estimated Length of Stay: 2-3 weeks SLP Intensity: Minumum of 1-2 x/day, 30 to 90 minutes SLP Frequency: 5 out of 7 days SLP Duration/Estimated Length of Stay: 2-3 weeks    Team Interventions: Item RN PT OT SLP SW TR Other  Self Care/Advanced ADL Retraining   x      Neuromuscular Re-Education  x x      Therapeutic Activities  x x x     UE/LE Strength Training/ROM  x x      UE/LE Coordination Activities  x x      Visual/Perceptual Remediation/Compensation  x x      DME/Adaptive  Equipment Instruction  x x      Therapeutic Exercise  x x      Balance/Vestibular Training  x x      Patient/Family Education x x x x     Cognitive Remediation/Compensation  x x x     Functional Mobility Training  x x      Ambulation/Gait Training  x       Museum/gallery curator  x       Wheelchair Propulsion/Positioning  x       Surveyor, mining Facilitation    x     Bladder Management x        Bowel Management x        Disease Management/Prevention x        Pain Management x  x      Medication Management x        Skin Care/Wound Management x        Splinting/Orthotics  x       Discharge Planning x x x x     Psychosocial Support x  x x                            Team Discharge Planning: Destination: PT-Home ,OT- Home , SLP-Home Projected Follow-up: PT-Home health PT;24 hour supervision/assistance, OT-  Home health OT, SLP-24 hour  supervision/assistance;Outpatient SLP Projected Equipment Needs: PT-Wheelchair (measurements);Rolling walker with 5" wheels, OT- 3 in 1 bedside comode;Tub/shower bench, SLP-None recommended by SLP Patient/family involved in discharge planning: PT- Patient;Patient unable/family or caregiver not available (wife not present),  OT-Patient, SLP-Patient  MD ELOS: 2-3 weeks Medical Rehab Prognosis:  Excellent Assessment: The patient has been admitted for CIR therapies. The team will be addressing, functional mobility, strength, stamina, balance, safety, adaptive techniques/equipment, self-care, bowel and bladder mgt, patient and caregiver education, NMR, cognition, communication/apraxia, psychosocial assessment and rx. Goals have been set at supervision to minimal assist.    Ranelle Oyster, MD, [facility]      See Team Conference Notes for weekly updates to the plan of care

## 2013-02-10 NOTE — Progress Notes (Signed)
Pt BP sitting down after physical activity 180/120 manual. Left Radial pulse 80. Pt asymptomatic. Pt was started to lisinopril 10 mg daily and one dose has been given today. Deatra Ina, PA, notified, ok to continue therapies s/t pt is not symptomatic. Hedy Camara

## 2013-02-10 NOTE — Evaluation (Signed)
Occupational Therapy Assessment and Plan  Patient Details  Name: [name] MRN: [id number] Date of Birth: [birth year]  OT Diagnosis: apraxia, cognitive deficits, disturbance of vision, hemiplegia affecting dominant side and muscle weakness (generalized) Rehab Potential: Rehab Potential: Good ELOS: 2-3 weeks   Today's Date: 02/10/2013 Time: 7829-5621 Time Calculation (min): 55 min  Problem List:  Patient Active Problem List   Diagnosis Date Noted  . 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 CVA (cerebral infarction) 02/03/2013  . HTN (hypertension) 02/03/2013  . Tobacco abuse 02/03/2013    Past Medical History:  Past Medical History  Diagnosis Date  . Hypertension   . Anginal pain   . Heart murmur   . Stroke 02/01/2013    left frontal CVA and symptoms of right arm and leg weakness and expressive aphasia/notes 02/03/2013 (02/03/2013)  . Psoriasis     "back and stomach" (02/03/2013)   Past Surgical History:  Past Surgical History  Procedure Laterality Date  . No past surgeries      Assessment & Plan Clinical Impression: Patient is a 57 y.o. right-handed male with history of hypertension as well as tobacco abuse. Admitted 02/03/2013 with right-sided weakness and aphasia. MRI of the brain showed acute infarct throughout the left anterior cerebral artery territory. MRA of the head with abrupt occlusion of the left anterior cerebral artery 8 mm beyond its origin. UDS positive for marijuana. Patient did not receive TPA. EEG consistent with focal neurological dysfunction involving the left cerebral hemisphere notably the left temporal parietal region. Echocardiogram with ejection fraction of 65% without embolism. Carotid Dopplers with no ICA stenosis. Neurology services consulted placed on aspirin for stroke prophylaxis as well as subcutaneous Lovenox for DVT prophylaxis. TEE 02/09/2013 showed normal LV function and no thrombus without PFO. Physical and occupational therapy evaluations  completed recommendations for physical medicine rehabilitation consult to consider inpatient rehabilitation services. Patient was felt to be a good candidate for inpatient rehabilitation services and was admitted for comprehensive rehabilitation program.    Patient transferred to CIR on 02/09/2013 .    Patient currently requires max with basic self-care skills secondary to muscle weakness, decreased cardiorespiratoy endurance, impaired timing and sequencing, abnormal tone, unbalanced muscle activation, motor apraxia, decreased coordination and decreased motor planning, decreased visual perceptual skills and decreased visual motor skills, decreased attention to right and ideational apraxia, decreased initiation, decreased awareness, decreased problem solving, decreased safety awareness, decreased memory and delayed processing and decreased sitting balance, decreased standing balance and decreased balance strategies.  Prior to hospitalization, patient could complete ADLs with independent .  Patient will benefit from skilled intervention to decrease level of assist with basic self-care skills and increase independence with basic self-care skills prior to discharge home with care partner.  Anticipate patient will require 24 hour supervision and minimal physical assistance and follow up home health.  OT - End of Session Activity Tolerance: Tolerates 30+ min activity without fatigue Endurance Deficit: No OT Assessment Rehab Potential: Good Barriers to Discharge: Decreased caregiver support Barriers to Discharge Comments: wife works during day OT Plan OT Intensity: Minimum of 1-2 x/day, 45 to 90 minutes OT Frequency: 5 out of 7 days OT Duration/Estimated Length of Stay: 2-3 weeks OT Treatment/Interventions: Balance/vestibular training;Cognitive remediation/compensation;Discharge planning;Disease mangement/prevention;DME/adaptive equipment instruction;Functional mobility training;Neuromuscular  re-education;Pain management;Patient/family education;Psychosocial support;Self Care/advanced ADL retraining;Therapeutic Activities;Therapeutic Exercise;UE/LE Strength taining/ROM;UE/LE Coordination activities;Visual/perceptual remediation/compensation OT Recommendation Patient destination: Home Follow Up Recommendations: Home health OT Equipment Recommended: 3 in 1 bedside comode;Tub/shower bench   Skilled Therapeutic Intervention OT eval, ADL assessment  completed at EOB.  Pt's BP elevated upon arrival, RN and PA notified.  PA encouraged participation in therapy, modified activity to mostly sitting tasks.  Pt with inconsistencies with RUE with decreased initiation, perseveration, and motor planning difficulties.  Once grasped wash cloth, pt unable to release grasp requiring manual removal of fingers.  Pt with decreased attention to Rt side and with multiple eye shifts during visual assessment.  Question whether true visual deficits or decreased comprehension secondary to aphasia.    OT Evaluation Precautions/Restrictions  Precautions Precautions: Fall Precaution Comments: Pt impulsive with poor awareness of deficits; Rt. inattention Restrictions Weight Bearing Restrictions: No General   Vital Signs Therapy Vitals Pulse Rate: 82 BP: 160/101 mmHg Patient Position, if appropriate: Sitting Pain Pain Assessment Pain Assessment: No/denies pain Home Living/Prior Functioning Home Living Lives With: Spouse Available Help at Discharge: Family;Available PRN/intermittently Type of Home: House Home Access: Level entry Entrance Stairs-Number of Steps: no steps Home Layout: One level Bathroom Shower/Tub: Engineer, manufacturing systems: Standard Bathroom Accessibility: Yes Home Adaptive Equipment: None Additional Comments: Pt reports wife works during day and won't have assistance during the day Prior Function Level of Independence: Independent with gait;Independent with transfers Able to  Take Stairs?: Yes Driving: Yes Vocation: Unemployed ADL ADL Grooming: Moderate assistance Where Assessed-Grooming: Edge of bed Upper Body Bathing: Minimal assistance Where Assessed-Upper Body Bathing: Edge of bed Lower Body Bathing: Moderate assistance Where Assessed-Lower Body Bathing: Edge of bed Upper Body Dressing: Unable to assess Lower Body Dressing: Maximal assistance Where Assessed-Lower Body Dressing: Edge of bed Toilet Transfer: Moderate assistance Toilet Transfer Method: Stand pivot Vision/Perception  Vision - History Baseline Vision: No visual deficits Patient Visual Report: No change from baseline Vision - Assessment Eye Alignment: Within Functional Limits Vision Assessment: Vision tested Tracking/Visual Pursuits: Decreased smoothness of vertical tracking;Decreased smoothness of horizontal tracking;Requires cues, head turns, or add eye shifts to track;Impaired - to be further tested in functional context Saccades: Additional head turns occurred during testing;Undershoots Visual Fields: No apparent deficits Additional Comments: Pt with decreased visual attention Perception Perception: Impaired Inattention/Neglect: Does not attend to right side of body Praxis Praxis: Impaired Praxis Impairment Details: Motor planning Praxis-Other Comments: Inconsistent movement, pt unable to lift up RUE on command, however in functional task pt able to reach for item.  Also with bathing, pt attempted to grasp washcloth with RUE but then unable to release despite trying to pull washcloth from Rt hand.  Cues to visually attend to RUE with grasp and release.  Cognition Overall Cognitive Status: Impaired/Different from baseline Arousal/Alertness: Awake/alert Attention: Sustained Sustained Attention: Impaired Memory: Impaired Awareness: Impaired Problem Solving: Impaired Problem Solving Impairment: Functional basic Safety/Judgment: Impaired Sensation Sensation Light Touch: Impaired  Detail Light Touch Impaired Details: Impaired RUE Stereognosis: Not tested Hot/Cold: Not tested Proprioception: Impaired by gross assessment Additional Comments: Patient reports bilat LE intact to light touch but presents with L extinction; reports greater sensation to light touch L > RLE Coordination Gross Motor Movements are Fluid and Coordinated: No Fine Motor Movements are Fluid and Coordinated: No Coordination and Movement Description: Limited by hemiplegia and apraxia Finger Nose Finger Test: limited by decreased shoulder AROM, undershooting with RUE Motor    Mobility     Trunk/Postural Assessment     Balance   Extremity/Trunk Assessment RUE Assessment RUE Assessment: Exceptions to [facility] RUE AROM (degrees) RUE Overall AROM Comments: Shoulder flexion approx 60 degrees, elbow flexion 5-120 degrees, wrist flexion/extension WFL, finger flexion/extension WFL, increased time and focus for each movement RUE  PROM (degrees) RUE Overall PROM Comments: pt reports slight shoulder pain/tightness greater than 90 degrees RUE Strength RUE Overall Strength Comments: inconsistent grossly 2/5 shoulder and 4/5 grasp - decreased ability to release grasp LUE Assessment LUE Assessment: Within Functional Limits  FIM:  FIM - Grooming Grooming Steps: Wash, rinse, dry face;Wash, rinse, dry hands Grooming: 3: Patient completes 2 of 4 or 3 of 5 steps FIM - Bathing Bathing Steps Patient Completed: Chest;Right Arm;Abdomen;Front perineal area;Right upper leg;Left upper leg;Right lower leg (including foot);Left lower leg (including foot) Bathing: 3: Mod-Patient completes 5-7 69f 10 parts or 50-74% FIM - Upper Body Dressing/Undressing Upper body dressing/undressing: 0: Wears gown/pajamas-no public clothing FIM - Lower Body Dressing/Undressing Lower body dressing/undressing steps patient completed: Don/Doff right sock;Don/Doff left sock Lower body dressing/undressing: 2: Max-Patient completed 25-49% of  tasks FIM - Banker Devices: Bed rails Bed/Chair Transfer: 4: Supine > Sit: Min A (steadying Pt. > 75%/lift 1 leg);3: Bed > Chair or W/C: Mod A (lift or lower assist);3: Chair or W/C > Bed: Mod A (lift or lower assist);4: Sit > Supine: Min A (steadying pt. > 75%/lift 1 leg)   Refer to Care Plan for Long Term Goals  Recommendations for other services: None  Discharge Criteria: Patient will be discharged from OT if patient refuses treatment 3 consecutive times without medical reason, if treatment goals not met, if there is a change in medical status, if patient makes no progress towards goals or if patient is discharged from hospital.  The above assessment, treatment plan, treatment alternatives and goals were discussed and mutually agreed upon: by patient  Leonette Monarch 02/10/2013, 10:56 AM

## 2013-02-11 ENCOUNTER — Encounter (HOSPITAL_COMMUNITY): Payer: 59

## 2013-02-11 ENCOUNTER — Inpatient Hospital Stay (HOSPITAL_COMMUNITY): Payer: 59

## 2013-02-11 ENCOUNTER — Inpatient Hospital Stay (HOSPITAL_COMMUNITY): Payer: 59 | Admitting: Speech Pathology

## 2013-02-11 ENCOUNTER — Inpatient Hospital Stay (HOSPITAL_COMMUNITY): Payer: 59 | Admitting: *Deleted

## 2013-02-11 ENCOUNTER — Inpatient Hospital Stay (HOSPITAL_COMMUNITY): Payer: 59 | Admitting: Occupational Therapy

## 2013-02-11 NOTE — Progress Notes (Signed)
Subjective/Complaints: No complaints. Had a reasonable day. In good spirits today. A 12 point review of systems has been performed and if not noted above is otherwise negative.   Objective: Vital Signs: Blood pressure 135/87, pulse 71, temperature 97.4 F (36.3 C), temperature source Oral, resp. rate 19, height 5\' 9"  (1.753 m), weight 83.4 kg (183 lb 13.8 oz), SpO2 100.00%. No results found.  Recent Labs  02/09/13 2100 02/10/13 0553  WBC 10.1 9.9  HGB 14.5 14.6  HCT 42.1 42.8  PLT 276 269    Recent Labs  02/09/13 2100 02/10/13 0553  NA  --  138  K  --  4.1  CL  --  102  GLUCOSE  --  99  BUN  --  17  CREATININE 1.10 1.14  CALCIUM  --  9.4   CBG (last 3)  No results found for this basename: GLUCAP,  in the last 72 hours  Wt Readings from Last 3 Encounters:  02/10/13 83.4 kg (183 lb 13.8 oz)  02/03/13 83.915 kg (185 lb)  02/03/13 83.915 kg (185 lb)    Physical Exam:  HENT:  Head: Normocephalic.  Eyes: EOM are normal.  Neck: Neck supple. No thyromegaly present.  Cardiovascular: Normal rate and regular rhythm.  Pulmonary/Chest: Effort normal and breath sounds normal. No respiratory distress.  Abdominal: Soft. Bowel sounds are normal. He exhibits no distension.  Neurological: He is alert.  Patient with word finding deficits, verbal apraxia. He was able to state his name  Identified multiple objects for me. He did follow simple commands. RUE is tr to 3+/5 and inconsistent. RLE is trace to 1 at HF and KE and 0/5 distally with inconsistency as well and likely apraxia. Decreased sensation to light touch but could grossly sense pain.  Skin: Skin is warm and dry.  Psychiatric:  Very flat    Assessment/Plan: 1. 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 Physiatrist is providing close team supervision and 24 hour management of active  medical problems listed below. Physiatrist and rehab team continue to assess barriers to discharge/monitor patient progress toward functional and medical goals. FIM: FIM - Bathing Bathing Steps Patient Completed: Chest;Right Arm;Abdomen;Front perineal area;Right upper leg;Left upper leg;Right lower leg (including foot);Left lower leg (including foot) Bathing: 3: Mod-Patient completes 5-7 65f 10 parts or 50-74%  FIM - Upper Body Dressing/Undressing Upper body dressing/undressing: 0: Wears gown/pajamas-no public clothing FIM - Lower Body Dressing/Undressing Lower body dressing/undressing steps patient completed: Don/Doff right sock;Don/Doff left sock Lower body dressing/undressing: 2: Max-Patient completed 25-49% of tasks     FIM - Diplomatic Services operational officer Devices: Grab bars Toilet Transfers: 1-Two helpers  FIM - Banker Devices: Bed rails;HOB elevated Bed/Chair Transfer: 4: Supine > Sit: Min A (steadying Pt. > 75%/lift 1 leg);4: Sit > Supine: Min A (steadying pt. > 75%/lift 1 leg);2: Bed > Chair or W/C: Max A (lift and lower assist);2: Chair or W/C > Bed: Max A (lift and lower assist)  FIM - Locomotion: Ambulation Ambulation/Gait Assistance: 1: +2 Total assist  Comprehension Comprehension Mode: Auditory Comprehension: 4-Understands basic 75 - 89% of the time/requires cueing 10 - 24% of the time  Expression Expression Mode: Verbal Expression: 5-Expresses basic needs/ideas: With extra time/assistive device  Social Interaction Social Interaction: 4-Interacts appropriately 75 - 89% of the time - Needs redirection for appropriate language or to initiate interaction.  Problem Solving  Problem Solving: 4-Solves basic 75 - 89% of the time/requires cueing 10 - 24% of the time  Memory Memory: 3-Recognizes or recalls 50 - 74% of the time/requires cueing 25 - 49% of the time  Medical Problem List and Plan:  1. Left anterior  circulation infarct felt to be embolic  2. DVT Prophylaxis/Anticoagulation: Subcutaneous Lovenox. Monitor platelet counts any signs of bleeding  3. Neuropsych: This patient is not capable of making decisions on his/her own behalf.  4. Hypertension. No present antihypertensive medication. Patient on hydrochlorothiazide 25 mg daily prior to admission. Began low dose ACE to start/ don't want to lower pressure too much--continue to follow 5. Tobacco/marijuana abuse. Continue NicoDerm patch and provide counseling  6. Hyperlipidemia. Lipitor   LOS (Days) 2 A FACE TO FACE EVALUATION WAS PERFORMED  SWARTZ,ZACHARY T 02/11/2013 9:14 AM

## 2013-02-11 NOTE — Progress Notes (Signed)
Occupational Therapy Session Note  Patient Details  Name: [name] MRN: [id number] Date of Birth: 1956-07-19  Today's Date: 02/11/2013 Time: 0900-0955 Time Calculation (min): 55 min  Short Term Goals: Week 1:  OT Short Term Goal 1 (Week 1): Pt will complete bathing at min assist level sit <> stand OT Short Term Goal 2 (Week 1): Pt will complete UB dressing with min assist OT Short Term Goal 3 (Week 1): Pt will complete LB dressing with mod assist OT Short Term Goal 4 (Week 1): Pt will complete toilet transfer with min assist stand pivot OT Short Term Goal 5 (Week 1): Pt will demonstrate use of RUE in self-care tasks gross assist with min cues  Skilled Therapeutic Interventions/Progress Updates:    Pt seen for ADL retraining with focus on sit <> stand and attention to and functional use of RUE.  Pt continues to demonstrate decreased safety awareness, impulsivity, and poor frustration tolerance as well as poor motor planning and ideational apraxia.  Pt with increased use of mouth this session instead of RUE with opening toothpaste and applying toothpaste, without even attempting use of RUE despite encouragement.  Pt with increased impulsivity this session with attempting to stand without breaks locked.  Educated pt on hemi-technique with dressing Rt extremity first to increase success.  Pt hesitant to take direction from therapist this session. Engaged in NM re-ed in standing with focus on grasp/release, naming items, and crossing midline.  Pt demonstrated increased shoulder flexion this session and increased purposeful release of items, with one instance of perseveration with inability to release object.  Educated pt on visually attending to RUE to increase mobility.  Therapy Documentation Precautions:  Precautions Precautions: Fall Precaution Comments: Pt impulsive with poor awareness of deficits; Rt. inattention Restrictions Weight Bearing Restrictions: No General:   Vital  Signs: Therapy Vitals Pulse Rate: 85 BP: 134/87 mmHg Patient Position, if appropriate: Sitting Pain: Pain Assessment Pain Assessment: No/denies pain Pain Score: 0-No pain  See FIM for current functional status  Therapy/Group: Individual Therapy  Leonette Monarch 02/11/2013, 12:19 PM

## 2013-02-11 NOTE — Progress Notes (Signed)
Social Work Patient ID: [name], male   DOB: 01-28-1956, 57 y.o.   MRN: [id number] Met with wife who asked about applying for SSD and handicapped application.  Have left disability application in pt's room for wife to begin completing and worker to  Supply medical information.  Will get handicapped application prior to discharge for pt and wife.  Continue to work on discharge plan.

## 2013-02-11 NOTE — Progress Notes (Signed)
Occupational Therapy Session Note  Patient Details  Name: [name] MRN: [id number] Date of Birth: June 24, 1956  Today's Date: 02/11/2013 Time: 1120-1150 Time calculation (min): 30 min   Short Term Goals: Week 1:  OT Short Term Goal 1 (Week 1): Pt will complete bathing at min assist level sit <> stand OT Short Term Goal 2 (Week 1): Pt will complete UB dressing with min assist OT Short Term Goal 3 (Week 1): Pt will complete LB dressing with mod assist OT Short Term Goal 4 (Week 1): Pt will complete toilet transfer with min assist stand pivot OT Short Term Goal 5 (Week 1): Pt will demonstrate use of RUE in self-care tasks gross assist with min cues  Skilled Therapeutic Interventions/Progress Updates:  Therapy session utilized ADL tasks to address motor planning with RUE, apraxia, aphasia, and perseveration. Pt supine in bed upon OT arrival and completed squat pivot transfer bed>w/c with mod assist. Engaged in grooming task of brushing teeth with 3 grooming items placed on sink. Cued pt to reach for particular items with RUE however pt initiated reaching with LUE secondary to decreased motor planning. Pt accurately retrieved 2/2 items correctly and verbalized how to use them. Completed oral care however perseverating on task and required cues to move onto next task. Required several cues to use hands bilaterally to twist cap back onto toothpaste as pt tried placing cap in mouth and using LUE only. Addressed grasp/release when retrieving specific grooming items when 5 placed in front of him. Pt retrieved correct items approx 85% of time. Able to verbalize how item was used however inconsistently utilized grooming item in function correctly.       Therapy Documentation Precautions:  Precautions Precautions: Fall Precaution Comments: Pt impulsive with poor awareness of deficits; Rt. inattention Restrictions Weight Bearing Restrictions: No General:   Vital Signs:   Pain: Pain  Assessment Pain Assessment: No/denies pain ADL: ADL Grooming: Moderate assistance Where Assessed-Grooming: Edge of bed Upper Body Bathing: Minimal assistance Where Assessed-Upper Body Bathing: Edge of bed Lower Body Bathing: Moderate assistance Where Assessed-Lower Body Bathing: Edge of bed Upper Body Dressing: Unable to assess Lower Body Dressing: Maximal assistance Where Assessed-Lower Body Dressing: Edge of bed Toilet Transfer: Moderate assistance Toilet Transfer Method: Stand pivot Exercises:   Other Treatments:    See FIM for current functional status  Therapy/Group: Individual Therapy  Daneil Dan 02/11/2013, 3:09 PM

## 2013-02-11 NOTE — Progress Notes (Signed)
Physical Therapy Session Note  Patient Details  Name: [name] MRN: [id number] Date of Birth: Mar 11, 1956  Today's Date: 02/11/2013 Time: 1405-1500 Time Calculation (min): 55 min  Short Term Goals: Week 1:  PT Short Term Goal 1 (Week 1): Patient will perform bed mobility from flat bed, no rails to L and R with min A and improved attention and initiation with RUE and RLE PT Short Term Goal 2 (Week 1): Patient will peformed bed <> w/c transfers mod A to L and R with improved attention and initiation of RLE to pivot with 75% cues PT Short Term Goal 3 (Week 1): Patient will perform w/c mobility x 150' in controlled environment with L hemi technique and max A with 75% cues for sequencing and attention to R PT Short Term Goal 4 (Week 1): Patient will perform gait in controlled environment x 50' with LRAD and max A of one person and improved initiation and advancement of RLE with 75% cues  Skilled Therapeutic Interventions/Progress Updates:    Patient received supine in bed. Session focused on bed mobility on flat surface, core/trunk NMR with dynamic sitting balance, and wheelchair mobility, see details below. Patient performed rolling to bilateral sides, into prone and supine with supervision on flat surface. Patient does not demonstrate active movement of R LE, but is able to compensate during bed mobility tasks. Patient returned to room and left seated in wheelchair with seatbelt donned and all needs within reach.  Therapy Documentation Precautions:  Precautions Precautions: Fall Precaution Comments: Pt impulsive with poor awareness of deficits; Rt. inattention Restrictions Weight Bearing Restrictions: No Pain: Pain Assessment Pain Assessment: No/denies pain Pain Score: 0-No pain Locomotion : Ambulation Ambulation/Gait Assistance: Not tested (comment) Wheelchair Mobility Wheelchair Mobility: Yes Wheelchair Assistance: 4: Min Chiropodist Details: Verbal cues for  sequencing;Verbal cues for technique;Verbal cues for precautions/safety;Manual facilitation for placement;Verbal cues for safe use of DME/AE Wheelchair Propulsion: Left lower extremity (for L LE strengthening); Patient requires L handheld during wheelchair mobility in order to facilitate use of L LE for strengthening. Patient cannot coordinate use of L UE and L LE secondary to apraxia and coordination deficits and cannot follow commands secondary to aphasia. Wheelchair Parts Management: Needs assistance Distance: 120  Balance: Static Sitting Balance Static Sitting - Balance Support: No upper extremity supported;Feet supported Static Sitting - Level of Assistance: 5: Stand by assistance Dynamic Sitting Balance Dynamic Sitting - Balance Support: Feet supported Dynamic Sitting - Level of Assistance: 4: Min assist Dynamic Sitting - Balance Activities: Lateral lean/weight shifting;Forward lean/weight shifting;Reaching for objects;Reaching for weighted objects;Reaching across midline Dynamic Sitting - Comments: Patient performed dynamic sitting balance activity, reaching for objects on floor, to the side, in front of him, and across midline. Patient requires consistent min assist with two instances of mod assist secondary to LOB laterallky to the R. Other Treatments: Treatments Therapeutic Activity: Patient performed scooting along edge of mat x2 with max verbal and tactile cues for attention to R LE placement. Patient requires consistent min assist, mod assist for posterior LOB. Patient with very quick pace and impulsivity. Weight Bearing Technique Weight Bearing Technique: Yes RUE Weight Bearing Technique: Prone;Other (comment) LUE Weight Bearing Technique: Prone;Other (comment) Response to Weight Bearing Technique: Prolonged prone, prone on elbows to facilitate increased weight bearing through R UE/elbow.  See FIM for current functional status  Therapy/Group: Individual Therapy  Chipper Herb. Haddy Mullinax, PT, DPT  02/11/2013, 4:27 PM

## 2013-02-11 NOTE — Progress Notes (Signed)
Speech Language Pathology Daily Session Note  Patient Details  Name: [name] MRN: [id number] Date of Birth: [birth year]  Today's Date: 02/11/2013 Time: 1005-1045 Time Calculation (min): 40 min  Short Term Goals: Week 1: SLP Short Term Goal 1 (Week 1): Pt will utilize call bell to request assistance with Min A verbal and question cues.  SLP Short Term Goal 2 (Week 1): Pt will utilize external memory aids to increase recall/carryover of newly learned informaiton with Min A verbal and question cues.  SLP Short Term Goal 3 (Week 1): Pt will demonstrate functional problem solving with basic and familair tasks with Min A verbal and question cues.  SLP Short Term Goal 4 (Week 1): Pt will self-monitor and correct verbal errors at the sentence level with Min A verbal cues. SLP Short Term Goal 5 (Week 1): Pt will utilize word-finding strategies with Min A verbal and question cues.   Skilled Therapeutic Interventions: Skilled treatment session focused on addressing cognitive-linguistic goals.  SLP facilitated session with 4 step picture cards that patient sequenced with Min assist verbal cues.  SLP also facilitated session with increased wait time and Min assist phonemic cues to verbally express what was happening in each picture.  Patient exhibited whole word and phrase repetitions with suspected perseverations at times and anomia.  During discussion patient reported increased difficulty without picture or words being present; to assist SLP created written phrases and sentences to assist with expressing basic needs and wants to RNs. Patient able to return use with Supervision level question cues. Continue with current plan of care.   FIM:  Comprehension Comprehension Mode: Auditory Comprehension: 4-Understands basic 75 - 89% of the time/requires cueing 10 - 24% of the time Expression Expression Mode: Verbal Expression: 3-Expresses basic 50 - 74% of the time/requires cueing 25 - 50% of the time.  Needs to repeat parts of sentences. Social Interaction Social Interaction: 4-Interacts appropriately 75 - 89% of the time - Needs redirection for appropriate language or to initiate interaction. Problem Solving Problem Solving: 3-Solves basic 50 - 74% of the time/requires cueing 25 - 49% of the time Memory Memory: 3-Recognizes or recalls 50 - 74% of the time/requires cueing 25 - 49% of the time  Pain Pain Assessment Pain Assessment: No/denies pain  Therapy/Group: Individual Therapy  Charlane Ferretti., CCC-SLP [phone]  [name] 02/11/2013, 1:30 PM

## 2013-02-11 NOTE — Progress Notes (Signed)
1500 Pt. Exhibits unsafe behaviors in room.  Q.R. belt applied for safety; patient removed and attempted to ambulate in room unassisted.  Patient back to bed; bed alarm set.  Patient insists on having all four side rails up in bed.  RN educated patient on need to call for assistance as needed for patient safety.    Call bell in reach.

## 2013-02-12 ENCOUNTER — Inpatient Hospital Stay (HOSPITAL_COMMUNITY): Payer: 59 | Admitting: Speech Pathology

## 2013-02-12 ENCOUNTER — Inpatient Hospital Stay (HOSPITAL_COMMUNITY): Payer: 59 | Admitting: Occupational Therapy

## 2013-02-12 ENCOUNTER — Inpatient Hospital Stay (HOSPITAL_COMMUNITY): Payer: 59 | Admitting: Physical Therapy

## 2013-02-12 DIAGNOSIS — I6340 Cerebral infarction due to embolism of unspecified cerebral artery: Secondary | ICD-10-CM

## 2013-02-12 DIAGNOSIS — I10 Essential (primary) hypertension: Secondary | ICD-10-CM

## 2013-02-12 DIAGNOSIS — F323 Major depressive disorder, single episode, severe with psychotic features: Secondary | ICD-10-CM

## 2013-02-12 MED ORDER — BISACODYL 10 MG RE SUPP
10.0000 mg | Freq: Every day | RECTAL | Status: DC | PRN
  Administered 2013-02-14: 10 mg via RECTAL
  Filled 2013-02-12: qty 1

## 2013-02-12 NOTE — Progress Notes (Signed)
Occupational Therapy Session Note  Patient Details  Name: [name] MRN: [id number] Date of Birth: [birth year]  Today's Date: 02/12/2013 Time: [phone] and [id number] Time Calculation (min): 58 min and 42 min  Short Term Goals: Week 1:  OT Short Term Goal 1 (Week 1): Pt will complete bathing at min assist level sit <> stand OT Short Term Goal 2 (Week 1): Pt will complete UB dressing with min assist OT Short Term Goal 3 (Week 1): Pt will complete LB dressing with mod assist OT Short Term Goal 4 (Week 1): Pt will complete toilet transfer with min assist stand pivot OT Short Term Goal 5 (Week 1): Pt will demonstrate use of RUE in self-care tasks gross assist with min cues  Skilled Therapeutic Interventions/Progress Updates:    1) Pt seen for ADL retraining with focus on RUE and RLE attention and functional use, hemi-technique with dressing, transfers, and sit <> stand.  Pt engaged in bathing at sit <> stand level in walk-in shower.  Pt impulsive with attempting to stand multiple times throughout shower requiring mod-max cues and physical assistance for safety as pt would stand without appropriate RLE positioning.  Pt performed stand pivot transfers with mod assist.  Required hand over hand assist with RUE to initiate bathing with RUE and holding toothpaste in Rt hand instead of mouth.  Pt tends to compensate for decreased automatic function in RUE with use of mouth.  Verbal cues for pt to dress Rt side first to increase success.  Pt returned to bed with cues for use of LLE to assist in positioning RLE and use of BLE to assist in pushing up in bed with manual facilitation at Rt foot to maintain leverage.  2) Pt seen for 1:1 OT with focus on increased automatic response both verbally and with functional use of RUE.  Engaged in animal naming and sorting task in standing with pt able to pick up each animal figure with Rt hand and name the animal.  Pt required additional time for some less familiar  animals and demonstrated occasional stuttering.  Pt required mod verbal and tactile cues for use of RUE to pick up items. As treatment session progressed, pt with increased initiation and motor control of RUE requiring no additional cues for use of Rt hand.  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  Pt requires physical setup of RLE prior to sit to stand and min cues to bear weight through RLE.  Pt returned to bed with min assist stand pivot to Lt and use of BLE to push up into bed with manual facilitation at RLE.  Therapy Documentation Precautions:  Precautions Precautions: Fall Precaution Comments: Pt impulsive with poor awareness of deficits; Rt. inattention Restrictions Weight Bearing Restrictions: No Pain: Pain Assessment Pain Assessment: No/denies pain  See FIM for current functional status  Therapy/Group: Individual Therapy  Leonette Monarch 02/12/2013, 12:21 PM

## 2013-02-12 NOTE — Progress Notes (Signed)
Physical Therapy Session Note  Patient Details  Name: [name] MRN: [id number] Date of Birth: 1956-03-24  Today's Date: 02/12/2013 Time: 1103-1201 Time Calculation (min): 58 min  Short Term Goals: Week 1:  PT Short Term Goal 1 (Week 1): Patient will perform bed mobility from flat bed, no rails to L and R with min A and improved attention and initiation with RUE and RLE PT Short Term Goal 2 (Week 1): Patient will peformed bed <> w/c transfers mod A to L and R with improved attention and initiation of RLE to pivot with 75% cues PT Short Term Goal 3 (Week 1): Patient will perform w/c mobility x 150' in controlled environment with L hemi technique and max A with 75% cues for sequencing and attention to R PT Short Term Goal 4 (Week 1): Patient will perform gait in controlled environment x 50' with LRAD and max A of one person and improved initiation and advancement of RLE with 75% cues  Skilled Therapeutic Interventions/Progress Updates:   Patient performed supine > sit from bed with HOB elevated and bed rail with min A to fully advance RLE to EOB; seated EOB performed sitting balance and trunk control training and sequencing to doff red socks and don bilat shoes; required min-mod A to maintain sitting balance when elevating LLE or reaching down to RLE but patient demonstrated increased initiation and use of RUE to assist with sock and shoe management.  Performed stand pivot bed > w/c > mat stand pivot with max A with assistance for lifting and to assist with weight shifting to pivot RLE and assist to control stand > sit.  Reviewed w/c mobility with L hemi technique with patient still attempting to use LUE only; held LUE to minimize use and use of tactile cues to facilitate LLE propulsion x 150' with max A.  Once seated edge of mat performed NMR for RUE and RLE to assist with initiation, coordination, sequencing and to minimize synergy patterns in UE.  See below for details.  Performed gait training  with RW x 75' in controlled environmnent with +2 A with one person assisting with L lateral weight shift and safe management of RW with second therapist assisting with initiation of RLE hip and knee flexion and safe placement of RLE with verbal cues for increased step and stride length.  Returned to room in w/c, QR belt donned and patient set up with lunch.    Therapy Documentation Precautions:  Precautions Precautions: Fall Precaution Comments: Pt impulsive with poor awareness of deficits; Rt. inattention Restrictions Weight Bearing Restrictions: No Pain: Pain Assessment Pain Assessment: No/denies pain Locomotion : Ambulation Ambulation/Gait Assistance: 1: +2 Total assist  Other Treatments: Treatments Neuromuscular Facilitation: Right;Upper Extremity;Lower Extremity;Activity to increase motor control;Activity to increase timing and sequencing;Activity to increase sustained activation;Forced use;Activity to increase coordination;Activity to increase grading seated with bilat UE holding soccer ball with focus on simultaneous R and LUE reaching forwards to target and then performing elbow flexion to touch ball to chest, forehead, each shoulder and extend to touch target at various points outside of BOS forwards and to R and L to facilitate transverse plane movement; attempted to activate automatic movement in RLE with cues to kick soccer ball on floor but patient attempting to use LUE to initiate RLE movement; placed R foot on top of ball and attempted to assist patient with AAROM for knee flexion <> extension but patient unable to initiate movement; transitioned to use of mirror therapy with tall mirror blocking view  of RLE; patient cued to maintain visual of LE in mirror (reflection of LLE but appears to be RLE) while LLE and LUE cued to perform various AROM; patient required extra time and simple cues to initiate desired movement and would at times to perseverate on ankle movement and unable to  sequence picking foot up off of floor; also required min-mod A to maintain sitting balance and trunk control during LLE movements secondary to tendency to lean posterior and to the R.    See FIM for current functional status  Therapy/Group: Individual Therapy  [name] 02/12/2013, 12:20 PM

## 2013-02-12 NOTE — Progress Notes (Signed)
Subjective/Complaints: No complaints but appears very flat and disconnected. Denies there is a problem A 12 point review of systems has been performed and if not noted above is otherwise negative.   Objective: Vital Signs: Blood pressure 147/95, pulse 69, temperature 97.9 F (36.6 C), temperature source Oral, resp. rate 19, height 5\' 9"  (1.753 m), weight 83.4 kg (183 lb 13.8 oz), SpO2 99.00%. No results found.  Recent Labs  02/09/13 2100 02/10/13 0553  WBC 10.1 9.9  HGB 14.5 14.6  HCT 42.1 42.8  PLT 276 269    Recent Labs  02/09/13 2100 02/10/13 0553  NA  --  138  K  --  4.1  CL  --  102  GLUCOSE  --  99  BUN  --  17  CREATININE 1.10 1.14  CALCIUM  --  9.4   CBG (last 3)  No results found for this basename: GLUCAP,  in the last 72 hours  Wt Readings from Last 3 Encounters:  02/10/13 83.4 kg (183 lb 13.8 oz)  02/03/13 83.915 kg (185 lb)  02/03/13 83.915 kg (185 lb)    Physical Exam:  HENT:  Head: Normocephalic.  Eyes: EOM are normal.  Neck: Neck supple. No thyromegaly present.  Cardiovascular: Normal rate and regular rhythm.  Pulmonary/Chest: Effort normal and breath sounds normal. No respiratory distress.  Abdominal: Soft. Bowel sounds are normal. He exhibits no distension.  Neurological: He is alert.  Patient with word finding deficits, verbal apraxia. He was able to state his name  Identified multiple objects for me. He did follow simple commands. RUE is tr to 3+/5 and inconsistent. RLE is trace to 1 at HF and KE and 0/5 distally with inconsistency as well and likely apraxia. Decreased sensation to light touch but could grossly sense pain.  Skin: Skin is warm and dry.  Psychiatric:  Very flat, rarely makes eye contact.   Assessment/Plan: 1. 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 Physiatrist is providing close team  supervision and 24 hour management of active medical problems listed below. Physiatrist and rehab team continue to assess barriers to discharge/monitor patient progress toward functional and medical goals. FIM: FIM - Bathing Bathing Steps Patient Completed: Chest;Right Arm;Abdomen;Front perineal area;Right upper leg;Left upper leg Bathing: 3: Mod-Patient completes 5-7 77f 10 parts or 50-74%  FIM - Upper Body Dressing/Undressing Upper body dressing/undressing steps patient completed: Thread/unthread left sleeve of pullover shirt/dress;Put head through opening of pull over shirt/dress;Pull shirt over trunk Upper body dressing/undressing: 4: Min-Patient completed 75 plus % of tasks FIM - Lower Body Dressing/Undressing Lower body dressing/undressing steps patient completed: Thread/unthread left underwear leg;Pull underwear up/down Lower body dressing/undressing: 3: Mod-Patient completed 50-74% of tasks     FIM - Diplomatic Services operational officer Devices: Grab bars Toilet Transfers: 1-Two helpers  FIM - Banker Devices: Bed rails;HOB elevated;Arm rests Bed/Chair Transfer: 4: Supine > Sit: Min A (steadying Pt. > 75%/lift 1 leg);3: Bed > Chair or W/C: Mod A (lift or lower assist);3: Chair or W/C > Bed: Mod A (lift or lower assist)  FIM - Locomotion: Wheelchair Distance: 120 Locomotion: Wheelchair: 2: Travels 50 - 149 ft with minimal assistance (Pt.>75%) FIM - Locomotion: Ambulation Ambulation/Gait Assistance: Not tested (comment) Locomotion: Ambulation: 0: Activity did not occur  Comprehension Comprehension Mode: Auditory Comprehension: 4-Understands basic 75 - 89% of the time/requires cueing 10 - 24% of the time  Expression Expression Mode: Verbal Expression: 3-Expresses basic 50 - 74% of the time/requires cueing 25 - 50% of the time. Needs to repeat parts of sentences.  Social Interaction Social Interaction: 4-Interacts appropriately  75 - 89% of the time - Needs redirection for appropriate language or to initiate interaction.  Problem Solving Problem Solving: 3-Solves basic 50 - 74% of the time/requires cueing 25 - 49% of the time  Memory Memory: 3-Recognizes or recalls 50 - 74% of the time/requires cueing 25 - 49% of the time  Medical Problem List and Plan:  1. 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 DVT Prophylaxis/Anticoagulation: Subcutaneous Lovenox. Monitor platelet counts any signs of bleeding  3. Neuropsych: This patient is not capable of making decisions on his/her own behalf.   -neuropsych assessment for mood 4. Hypertension. No present antihypertensive medication. Patient on hydrochlorothiazide 25 mg daily prior to admission. On low dose ACE to start/ don't want to lower pressure too much--continue with current regimen 5. Tobacco/marijuana abuse. Continue NicoDerm patch and provide counseling  6. Hyperlipidemia. Lipitor   LOS (Days) 3 A FACE TO FACE EVALUATION WAS PERFORMED  Rainie Crenshaw T 02/12/2013 9:11 AM

## 2013-02-12 NOTE — Progress Notes (Signed)
Speech Language Pathology Daily Session Note  Patient Details  Name: [name] MRN: [id number] Date of Birth: 1956-05-08  Today's Date: 02/12/2013 Time: 0900-0930 Time Calculation (min): 30 min  Short Term Goals: Week 1: SLP Short Term Goal 1 (Week 1): Pt will utilize call bell to request assistance with Min A verbal and question cues.  SLP Short Term Goal 2 (Week 1): Pt will utilize external memory aids to increase recall/carryover of newly learned informaiton with Min A verbal and question cues.  SLP Short Term Goal 3 (Week 1): Pt will demonstrate functional problem solving with basic and familair tasks with Min A verbal and question cues.  SLP Short Term Goal 4 (Week 1): Pt will self-monitor and correct verbal errors at the sentence level with Min A verbal cues. SLP Short Term Goal 5 (Week 1): Pt will utilize word-finding strategies with Min A verbal and question cues.   Skilled Therapeutic Interventions: Skilled treatment session focused on addressing cognitive-linguistic goals.  SLP facilitated session with picture cards with 4 items and patient was asked to identify item that did not fit into group with increased wait time, Mod assist question and Min assist phonemic cues.    Patient appeared frustrated but upon questioning him he reported that he did not want to talk about it.  Patient exhibited whole word and phrase repetitions with suspected dysfluencies in addition to perseverations and at times anomia.  Continue with current plan of care.   FIM:  Comprehension Comprehension Mode: Auditory Comprehension: 4-Understands basic 75 - 89% of the time/requires cueing 10 - 24% of the time Expression Expression Mode: Verbal Expression: 3-Expresses basic 50 - 74% of the time/requires cueing 25 - 50% of the time. Needs to repeat parts of sentences. Social Interaction Social Interaction: 4-Interacts appropriately 75 - 89% of the time - Needs redirection for appropriate language or to  initiate interaction. Problem Solving Problem Solving: 3-Solves basic 50 - 74% of the time/requires cueing 25 - 49% of the time Memory Memory: 3-Recognizes or recalls 50 - 74% of the time/requires cueing 25 - 49% of the time  Pain Pain Assessment Pain Assessment: No/denies pain Pain Score: 0-No pain  Therapy/Group: Individual Therapy  Charlane Ferretti., CCC-SLP [phone]  [name] 02/12/2013, 10:22 AM

## 2013-02-12 NOTE — Progress Notes (Signed)
Pt Last Bowel Movement was 02/06/13. I offered him PRN sorbitol but he refused. Instead he want to take PRN Senna which was given.

## 2013-02-13 ENCOUNTER — Inpatient Hospital Stay (HOSPITAL_COMMUNITY): Payer: 59 | Admitting: *Deleted

## 2013-02-13 ENCOUNTER — Inpatient Hospital Stay (HOSPITAL_COMMUNITY): Payer: 59 | Admitting: Occupational Therapy

## 2013-02-13 ENCOUNTER — Inpatient Hospital Stay (HOSPITAL_COMMUNITY): Payer: 59 | Admitting: Speech Pathology

## 2013-02-13 DIAGNOSIS — I6350 Cerebral infarction due to unspecified occlusion or stenosis of unspecified cerebral artery: Secondary | ICD-10-CM

## 2013-02-13 DIAGNOSIS — I10 Essential (primary) hypertension: Secondary | ICD-10-CM

## 2013-02-13 DIAGNOSIS — E785 Hyperlipidemia, unspecified: Secondary | ICD-10-CM

## 2013-02-13 NOTE — Consult Note (Signed)
NEUROCOGNITIVE STATUS EXAMINATION - [facility]   [name] is a 57 year old, right-handed man who was seen for a neurocognitive status examination to assess his emotional state and mental status post-stroke.  According to his medical record, Mr. Henney was admitted on 02/03/13 with right-sided weakness and aphasia.  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  Additionally, a urine drug screen was positive for marijuana.  He did not receive TPA.    Emotional Functioning:  Mr. Lepak described his mood as "good," though it is notable that his affect appeared irritated at times.  He acknowledged experiencing low mood at times in response to adjusting to his current physical limitations and because he is unable to contribute to the household the same way that he did previously.  However, he said that his low moods have not impacted his ability to participate in various therapies.  He denied any history of depression.  Mr. Foutz stated that he is able to overcome low moods by telling himself that it will "eventually get better."  He also relies on support from his wife.  Mr. Bacallao denied problems or changes with sleep or appetite.  Mr. Heldman' responses to a self-report measure of depressive symptoms were not suggestive of the presence of clinical depression at this time.    Mental Status:  Mr. Wamble' total score on an overall measure of mental status was not suggestive of the presence of significant cognitive impairment at this time (MMSE-2 brief = 14/16).  He lost two points for difficulty initially encoding two words into memory, though his performance on that subtest was likely adversely affected by aphasia.  Subjectively, Mr. Pellot reported experiencing difficulty with speech production, including difficulty accessing specific words in his vocabulary.  He denied changes in other aspects of cognitive  functioning.  Mr. Seaman was observed to have slurred, stuttered speech, particularly when he was searching for a particular word.  When responding to open-ended questions, he would often say "actually" over and over again in a stuttered fashion.  He would then close his eyes, sit back and try to relax.  His speech production improved when he was given cues on how to start speaking (e.g. start with yes or no).  Notably, Mr. Delaguila' ability for speech comprehension appeared intact.    Impressions and Recommendations:  Mr. Lembo' overall cognitive functioning was intact, except for aphasia and stutter.  There was no evidence of clinically significant depression or anxiety at this time.  However, it was notable that his affect appeared irritated at times, though he expressed feeling "good" and denied experiencing irritation.  As such, staff should be aware that his affect may not be congruent with his mood and if they are concerned that he is angry or not wanting to participate, based on his facial expressions, they should ask him how he is feeling, as he seemed to be forthright about his mood when asked specifically.  When Mr. Payer is having trouble communicating, staff and family can help to re-direct him by giving him an option of how to start off his sentence.  This technique seemed to calm his stutter and allow him to get his thoughts out.  Additionally, Mr. Mirsky' wife may choose to bring a laptop or iPad to the unit so that he can type (using his left hand) what he wants to say.  This should aid him in feeling more independent because he is able to convey  intended messages.  This will also likely combat occasional low mood and improve self-esteem during his recovery.  Should Mr. Main develop any new concerning behaviors or should staff have additional concerns regarding mood, a follow-up evaluation could be conducted.    DIAGNOSIS: Stroke Syndrome  Leavy Cella, Psy.D.  Clinical Neuropsychologist

## 2013-02-13 NOTE — Progress Notes (Signed)
Occupational Therapy Session Note  Patient Details  Name: [name] MRN: [id number] Date of Birth: July 06, 1956  Today's Date: 02/13/2013 Time: 0800-0845 and 1402-1500 Time Calculation (min): 45 min and 58 min  Short Term Goals: Week 1:  OT Short Term Goal 1 (Week 1): Pt will complete bathing at min assist level sit <> stand OT Short Term Goal 2 (Week 1): Pt will complete UB dressing with min assist OT Short Term Goal 3 (Week 1): Pt will complete LB dressing with mod assist OT Short Term Goal 4 (Week 1): Pt will complete toilet transfer with min assist stand pivot OT Short Term Goal 5 (Week 1): Pt will demonstrate use of RUE in self-care tasks gross assist with min cues  Skilled Therapeutic Interventions/Progress Updates:    1) Pt seen for ADL retraining with focus on increased functional use of RUE in self-care tasks, sit <> stand, and transfers.  Pt completed bathing at sit <> stand level at sink with increased usage of RUE as stabilizer on sink while engaging LUE for most of bathing tasks.  Hand over hand assist initially with use of Rt hand to hold items while using Lt hand to open them (body wash, toothpaste, deodorant). Pt with occasional instances of perseveration when unable to release shirt or washcloth with RUE, however when cued to visually attend to task and "open" pt able to open hand to release object.  Question cues for hemi-dressing technique.  Engaged in standing at washing machine to wash clothes with pt using BUE to place clothes in washer and RUE to turn on washer and chose setting.  Pt requires min-mod assist in standing secondary to decreased weight shifting and weight bearing on RLE.  2) Pt seen for 1:1 OT with focus on transfers, RUE weight bearing, and functional use of RUE.  Pt reports needing to toilet upon arrival.  Performed stand pivot transfer to toilet with pt requiring max assist secondary to decreased ability to pivot on RLE in this environment.  Pt attempted to  toilet, but was unable to.  Performed hand hygiene and donned shorts with assistance.  Pt attempted to put liquid soap in mouth (as he tends to use mouth instead of RUE), redirected and used RUE with hand over hand assist.  When questioned about hemi-dressing technique pt reports Rt leg first, however attempted to don Lt leg first.  Engaged in BUE tasks in unsupported sitting with focus on using hands together and performing tasks where each hand has a complimentary task yet not the same task.  Pt with difficulty separating Rt hand from Lt hand and would always bring Lt hand to Rt hand instead of vice versa.  At one point during session pt reports "I understand what you are saying, but why are we doing this?"  This phrase took more than reasonable time to come out with difficulty finding words and some stuttering but was clear with increased time.  Pt with increased functional use of RUE post weight bearing and repetition of activity.  Stand pivot transfer back to bed after session with min assist and carryover of technique to assist RLE into bed.  Pt's wife present at end of session.  Call bell and bed alarm in place, reiterated to pt and wife to have RN assistance when getting out of bed.  Therapy Documentation Precautions:  Precautions Precautions: Fall Precaution Comments: Pt impulsive with poor awareness of deficits; Rt. inattention Restrictions Weight Bearing Restrictions: No General:   Vital Signs: Therapy Vitals  Temp: 97.8 F (36.6 C) Temp src: Oral Pulse Rate: 72 Resp: 18 BP: 119/74 mmHg Patient Position, if appropriate: Lying Oxygen Therapy SpO2: 100 % Pain:  Pt with no c/o pain this session.  See FIM for current functional status  Therapy/Group: Individual Therapy  Leonette Monarch 02/13/2013, 8:46 AM

## 2013-02-13 NOTE — Progress Notes (Signed)
  Subjective: No new complaints. No new problems. Slept well. Feeling OK.  Objective: Vital signs in last 24 hours: Temp:  [97.8 F (36.6 C)-98.2 F (36.8 C)] 97.8 F (36.6 C) (05/31 0536) Pulse Rate:  [72-73] 72 (05/31 0536) Resp:  [18-20] 18 (05/31 0536) BP: (119-123)/(74-81) 119/74 mmHg (05/31 0536) SpO2:  [100 %] 100 % (05/31 0536) Weight change:  Last BM Date: 02/06/13  Intake/Output from previous day: 05/30 0701 - 05/31 0700 In: 720 [P.O.:720] Out: 675 [Urine:675] Last cbgs: CBG (last 3)  No results found for this basename: GLUCAP,  in the last 72 hours   Physical Exam General: No apparent distress    HEENT: moist mucosa Lungs: Normal effort. Lungs clear to auscultation, no crackles or wheezes. Cardiovascular: Regular rate and rhythm, no edema Musculoskeletal:  No change from before Neurological: No new neurological deficits Wounds: N/A    Skin: clear Alert, cooperative   Lab Results: BMET    Component Value Date/Time   NA 138 02/10/2013 0553   K 4.1 02/10/2013 0553   CL 102 02/10/2013 0553   CO2 26 02/10/2013 0553   GLUCOSE 99 02/10/2013 0553   BUN 17 02/10/2013 0553   CREATININE 1.14 02/10/2013 0553   CALCIUM 9.4 02/10/2013 0553   GFRNONAA 71* 02/10/2013 0553   GFRAA 82* 02/10/2013 0553   CBC    Component Value Date/Time   WBC 9.9 02/10/2013 0553   RBC 5.09 02/10/2013 0553   HGB 14.6 02/10/2013 0553   HCT 42.8 02/10/2013 0553   PLT 269 02/10/2013 0553   MCV 84.1 02/10/2013 0553   MCH 28.7 02/10/2013 0553   MCHC 34.1 02/10/2013 0553   RDW 13.5 02/10/2013 0553   LYMPHSABS 2.3 02/10/2013 0553   MONOABS 1.0 02/10/2013 0553   EOSABS 0.2 02/10/2013 0553   BASOSABS 0.0 02/10/2013 0553    Studies/Results: No results found.  Medications: I have reviewed the patient's current medications.  Assessment/Plan:   1. 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 DVT Prophylaxis/Anticoagulation: Subcutaneous Lovenox. Monitor platelet counts any signs of  bleeding  3. Neuropsych: This patient is not capable of making decisions on his/her own behalf.  -neuropsych assessment for mood  4. Hypertension. No present antihypertensive medication. Patient on hydrochlorothiazide 25 mg daily prior to admission. On low dose ACE to start/ don't want to lower pressure too much--continue with current regimen  5. Tobacco/marijuana abuse. Continue NicoDerm patch and provide counseling  6. Hyperlipidemia. Lipitor     Length of stay, days: 4  Sonda Primes , MD 02/13/2013, 9:17 AM

## 2013-02-13 NOTE — Progress Notes (Signed)
Physical Therapy Session Note  Patient Details  Name: [name] MRN: [id number] Date of Birth: 1956-03-25  Today's Date: 02/13/2013 Time: 1100-1200 Time Calculation (min): 60 min    Skilled Therapeutic Interventions/Progress Updates:  Patient in bed upon the beginning of the session transfer supine  to sit on EOB with supervision, transfer to w/c with minA,mainly needed for balance maintenance. W/C mobility training to the therapy gym and back with min A due to attention deficits and R neglect,as well as due to the fact that patient is very impulsive and ties to do everything too fast. In the therapy gym patient transferred to the mat with minA, sitting on the edge of mat with good balance, SAQ 2 x15  In an activ and active assistive manner, transfer to supine, bridging 2 x10 . Training in rolling R and L with no rails with supervision due to impulsiveness. Prone position achived ,weight bearing through B elbows, coming to quadruped with multiple attempts patient is able to archive and maintain position for up to 30 seconds ,weight shifting in quadruped in A-P plane. In sitting Edge of mat weight bearing through R elbow and coming to mid line with minA and constat cues. Patient returned to the room transferred back to bed with min A. Bed alarm on an all needs within reach.     Therapy Documentation Precautions:  Precautions Precautions: Fall Precaution Comments: Pt impulsive with poor awareness of deficits; Rt. inattention Restrictions Weight Bearing Restrictions: No  See FIM for current functional status  Therapy/Group: Individual Therapy  Dorna Mai 02/13/2013, 4:10 PM

## 2013-02-13 NOTE — Progress Notes (Signed)
Speech Language Pathology Daily Session Note  Patient Details  Name: [name] MRN: [id number] Date of Birth: 1955-11-10  Today's Date: 02/13/2013 Time: 2841-3244 Time Calculation (min): 25 min  Short Term Goals: Week 1: SLP Short Term Goal 1 (Week 1): Pt will utilize call bell to request assistance with Min A verbal and question cues.  SLP Short Term Goal 2 (Week 1): Pt will utilize external memory aids to increase recall/carryover of newly learned informaiton with Min A verbal and question cues.  SLP Short Term Goal 3 (Week 1): Pt will demonstrate functional problem solving with basic and familair tasks with Min A verbal and question cues.  SLP Short Term Goal 4 (Week 1): Pt will self-monitor and correct verbal errors at the sentence level with Min A verbal cues. SLP Short Term Goal 5 (Week 1): Pt will utilize word-finding strategies with Min A verbal and question cues.   Skilled Therapeutic Interventions: Therapeutic intervention completed, focusing on cognitive-linguistic goals.  Patient min A verbal cues to correctly perform activities that involved problem solving .  He required mod A verbal cues to utilize strategies to help decrease speech dysfluencies and min A verbal cues to utilize word finding strategies.  Continue with current plan of care.   FIM:  Comprehension Comprehension: 4-Understands basic 75 - 89% of the time/requires cueing 10 - 24% of the time Expression Expression Mode: Verbal Expression: 3-Expresses basic 50 - 74% of the time/requires cueing 25 - 50% of the time. Needs to repeat parts of sentences. Problem Solving Problem Solving: 3-Solves basic 50 - 74% of the time/requires cueing 25 - 49% of the time Memory Memory: 3-Recognizes or recalls 50 - 74% of the time/requires cueing 25 - 49% of the time  Pain Pain Assessment Pain Assessment: No/denies pain Pain Score: 0-No pain  Therapy/Group: Individual Therapy  Lenny Pastel 02/13/2013, 11:22  AM

## 2013-02-14 ENCOUNTER — Inpatient Hospital Stay (HOSPITAL_COMMUNITY): Payer: 59 | Admitting: Physical Therapy

## 2013-02-14 DIAGNOSIS — K5900 Constipation, unspecified: Secondary | ICD-10-CM

## 2013-02-14 MED ORDER — SORBITOL 70 % SOLN
30.0000 mL | Freq: Every day | Status: DC | PRN
  Administered 2013-02-14: 30 mL via ORAL
  Filled 2013-02-14: qty 30

## 2013-02-14 NOTE — Progress Notes (Signed)
  Subjective: C/o constipation. Slept well. Feeling OK.  Objective: Vital signs in last 24 hours: Temp:  [97.5 F (36.4 C)-97.9 F (36.6 C)] 97.5 F (36.4 C) (06/01 0626) Pulse Rate:  [73-80] 73 (06/01 0626) Resp:  [16] 16 (06/01 0626) BP: (117-132)/(83-88) 132/88 mmHg (06/01 0626) SpO2:  [97 %-100 %] 97 % (06/01 0626) Weight change:  Last BM Date: 02/06/13 (Refused Suppository or laxitive)  Intake/Output from previous day: 05/31 0701 - 06/01 0700 In: 480 [P.O.:480] Out: -  Last cbgs: CBG (last 3)  No results found for this basename: GLUCAP,  in the last 72 hours   Physical Exam General: No apparent distress    HEENT: moist mucosa Lungs: Normal effort. Lungs clear to auscultation, no crackles or wheezes. Cardiovascular: Regular rate and rhythm, no edema Musculoskeletal:  No change from before Neurological: No new neurological deficits Wounds: N/A    Skin: clear Alert, cooperative   Lab Results: BMET    Component Value Date/Time   NA 138 02/10/2013 0553   K 4.1 02/10/2013 0553   CL 102 02/10/2013 0553   CO2 26 02/10/2013 0553   GLUCOSE 99 02/10/2013 0553   BUN 17 02/10/2013 0553   CREATININE 1.14 02/10/2013 0553   CALCIUM 9.4 02/10/2013 0553   GFRNONAA 71* 02/10/2013 0553   GFRAA 82* 02/10/2013 0553   CBC    Component Value Date/Time   WBC 9.9 02/10/2013 0553   RBC 5.09 02/10/2013 0553   HGB 14.6 02/10/2013 0553   HCT 42.8 02/10/2013 0553   PLT 269 02/10/2013 0553   MCV 84.1 02/10/2013 0553   MCH 28.7 02/10/2013 0553   MCHC 34.1 02/10/2013 0553   RDW 13.5 02/10/2013 0553   LYMPHSABS 2.3 02/10/2013 0553   MONOABS 1.0 02/10/2013 0553   EOSABS 0.2 02/10/2013 0553   BASOSABS 0.0 02/10/2013 0553    Studies/Results: No results found.  Medications: I have reviewed the patient's current medications.  Assessment/Plan:   1. 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 DVT Prophylaxis/Anticoagulation: Subcutaneous Lovenox. Monitor platelet counts any signs of  bleeding  3. Neuropsych: This patient is not capable of making decisions on his/her own behalf.  -neuropsych assessment for mood  4. Hypertension. No present antihypertensive medication. Patient on hydrochlorothiazide 25 mg daily prior to admission. On low dose ACE to start/ don't want to lower pressure too much--continue with current regimen  5. Tobacco/marijuana abuse. Continue NicoDerm patch and provide counseling  6. Hyperlipidemia. Lipitor  7. Constipation - see meds    Length of stay, days: 5  Sonda Primes , MD 02/14/2013, 8:57 AM

## 2013-02-14 NOTE — Progress Notes (Signed)
Patient agreed to receive Dulcolax suppository this shift.  Suppository administered and resulted in an extra large BM.  Patient continent of bowel.  Stool formed.  Abdomen is soft and nondistended.  Patient denies discomfort.  Bowel sounds active x4.    Kelli Hope M

## 2013-02-14 NOTE — Progress Notes (Signed)
Physical Therapy Note  Patient Details  Name: Keiston Manley MRN: [id number] Date of Birth: 04-03-1956 Today's Date: 02/14/2013  1515-1555 (40 minutes) individual Pain: no reported pain Focus of treatment: Neuro re-ed RT LE Treatment: Transfers stand/turn mod assist with vcs for impulsivity/safety; Neuro re-ed RT LE- sidelying (gravity eliminated) AA hip flexion 4 X 20 (pt able to initiate active hip flexion in this position); sit to supine min assist ; supine to sit from right mod assist with tactile cues for sequencing; sit to stand to back of wc min assist blocking RT knee in extension with pt stepping forward/back LT LE to increase weight bearing RT LT (pt able to maintain partial knee extension without assist). Returned pt to room with bed alarm activated.    Iver Fehrenbach,JIM 02/14/2013, 4:05 PM

## 2013-02-15 ENCOUNTER — Inpatient Hospital Stay (HOSPITAL_COMMUNITY): Payer: 59 | Admitting: Physical Therapy

## 2013-02-15 ENCOUNTER — Inpatient Hospital Stay (HOSPITAL_COMMUNITY): Payer: 59 | Admitting: Occupational Therapy

## 2013-02-15 ENCOUNTER — Inpatient Hospital Stay (HOSPITAL_COMMUNITY): Payer: 59 | Admitting: Speech Pathology

## 2013-02-15 DIAGNOSIS — I10 Essential (primary) hypertension: Secondary | ICD-10-CM

## 2013-02-15 DIAGNOSIS — F323 Major depressive disorder, single episode, severe with psychotic features: Secondary | ICD-10-CM

## 2013-02-15 DIAGNOSIS — I6340 Cerebral infarction due to embolism of unspecified cerebral artery: Secondary | ICD-10-CM

## 2013-02-15 NOTE — Progress Notes (Signed)
Physical Therapy Session Note  Patient Details  Name: [name] MRN: [id number] Date of Birth: [birth year]  Today's Date: 02/15/2013 Time: 1100-1157 and 1400-1415 [phone] total time) Time Calculation (min): 57 min and 15 min (45 min cotreat with OT)  Short Term Goals: Week 1:  PT Short Term Goal 1 (Week 1): Patient will perform bed mobility from flat bed, no rails to L and R with min A and improved attention and initiation with RUE and RLE PT Short Term Goal 2 (Week 1): Patient will peformed bed <> w/c transfers mod A to L and R with improved attention and initiation of RLE to pivot with 75% cues PT Short Term Goal 3 (Week 1): Patient will perform w/c mobility x 150' in controlled environment with L hemi technique and max A with 75% cues for sequencing and attention to R PT Short Term Goal 4 (Week 1): Patient will perform gait in controlled environment x 50' with LRAD and max A of one person and improved initiation and advancement of RLE with 75% cues  Skilled Therapeutic Interventions/Progress Updates:   Second session co-treatment with OT for 45 minutes; focus of co treatment on NMR for RLE and RUE for motor control, timing/sequencing, sustained activation, lateral and anterior weight shifting and coordination.  Began in tall kneeling with bilat UE support on tall chair with focus on terminal hip extension and minimizing extensor tone in RLE during reaching with RUE up, forwards and out of BOS for weight shifting and increased trunk elongation and hip extension.  During tall kneeling patient became restless and slightly agitated.  Able to assist patient to edge of mat and calm patient; patient expressing that he is frustrated.  Transitioned to another activity.  In standing performed lateral and anterior weight shifting and focus on transverse plane movement with reaching with R and LUE for rings in various directions out of BOS.  During rotation patient's R knee noted to relax into flexion.   Performed gait training with RW x 82' + 150' in controlled environment with +2 for safety but patient only requiring min-mod physical assistance with improved ability to initiate and activate RLE flexion for swing phase and able to advance RLE full step length 80% of the time; patient did require mod A and verbal cues for increased step length during changes in direction; no extensor tone noted during gait.  Once in room patient transferred to bed and sit > supine min A secondary to lack of hip flexion to bring RLE into bed.    Therapy Documentation Precautions:  Precautions Precautions: Fall Precaution Comments: Pt impulsive with poor awareness of deficits; Rt. inattention Restrictions Weight Bearing Restrictions: No Vital Signs: Therapy Vitals Pulse Rate: 103 BP: 140/88 mmHg Patient Position, if appropriate: Sitting Oxygen Therapy SpO2: 99 % Pain: Pain Assessment Pain Assessment: No/denies pain Locomotion : Wheelchair Mobility Distance: 150 in controlled environment with mod A overall secondary to patient perseverating on use of LUE to propel despite being able to verbalize that foot propulsion is easier; required therapist to hold LUE in order to initiate and maintain use of LLE propulsion. Patient demonstrating ability to sequence verbally and demonstrate sequence for setting up w/c and parts management to prepare for transfers with questioning cues and extra time to attend to R side.   Performed multiple stand pivot transfers w/c <> mat and w/c <> Nustep with min-mod A for balance/stability during pivoting; patient was able to lift and place RLE once during pivot.     Other  Treatments: Treatments Neuromuscular Facilitation: Right;Lower Extremity;Activity to increase coordination;Activity to increase motor control;Activity to increase timing and sequencing;Activity to increase sustained activation beginning in L sidelying with RLE on powder board with focus on gravity minimized  activation of RLE knee flexion/extension and hip flexion/extension with use of visual target to facilitate automatic movement; with extra time and repetition patient able to illicit partial range knee flexion <> extension and hip flexion <> extension.  Returned to supine and performed bilat heel slides and hip flexion; again, able to perform with extra time and repetition.  In standing attempted to illicit hip and knee flexion for swing with foot tapping to visual target of 4" step to facilitate automatic stepping pattern but patient unable to over come extensor tone to flex hip and knee to advance foot to step.  Changed to Nustep for bilat LE and UE reciprocal activation of hip and knee flexion <> extension at level 5 x 8 minutes with bilat UE and LE + 1 minute with LE only.    See FIM for current functional status  Therapy/Group: Individual Therapy and Co-Treatment with OT  [name] 02/15/2013, 12:14 PM

## 2013-02-15 NOTE — Progress Notes (Addendum)
Speech Language Pathology Daily Session Note  Patient Details  Name: [name] MRN: [id number] Date of Birth: Jul 21, 1956  Today's Date: 02/15/2013 Time: 0830-0900 Time Calculation (min): 30  Short Term Goals: Week 1: SLP Short Term Goal 1 (Week 1): Pt will utilize call bell to request assistance with Min A verbal and question cues.  SLP Short Term Goal 2 (Week 1): Pt will utilize external memory aids to increase recall/carryover of newly learned informaiton with Min A verbal and question cues.  SLP Short Term Goal 3 (Week 1): Pt will demonstrate functional problem solving with basic and familair tasks with Min A verbal and question cues.  SLP Short Term Goal 4 (Week 1): Pt will self-monitor and correct verbal errors at the sentence level with Min A verbal cues. SLP Short Term Goal 5 (Week 1): Pt will utilize word-finding strategies with Min A verbal and question cues.   Skilled Therapeutic Interventions: Treatment focused on cognition and speech-language facilitation.  Pt. described objects to use as a compensatory technique with mild-moderate verbal cues.  Intelligibility in conversation (without known context) was approximately 50% due to decreased fluency and phonatory pitch breaks.  Emergent awareness is reduced to speech erros.  Pt. given verbal and demonstrational cues to stop when dysfluency occurs and  begin again with a soft easy onset.  Pt. read part of newspaper article with 30-40% intelligibiltiy.   FIM:  Comprehension Comprehension: 4-Understands basic 75 - 89% of the time/requires cueing 10 - 24% of the time Expression Expression: 3-Expresses basic 50 - 74% of the time/requires cueing 25 - 50% of the time. Needs to repeat parts of sentences. Social Interaction Social Interaction: 5-Interacts appropriately 90% of the time - Needs monitoring or encouragement for participation or interaction. Problem Solving Problem Solving: 4-Solves basic 75 - 89% of the time/requires cueing  10 - 24% of the time Memory Memory: 3-Recognizes or recalls 50 - 74% of the time/requires cueing 25 - 49% of the time  Pain Pain Assessment Pain Assessment: No/denies pain  Therapy/Group: Individual Therapy  Royce Macadamia 02/15/2013, 12:41 PM

## 2013-02-15 NOTE — Progress Notes (Signed)
Occupational Therapy Session Note  Patient Details  Name: [name] MRN: [id number] Date of Birth: Aug 11, 1956  Today's Date: 02/15/2013 Time: 0930-1030 and 1333-1400 (co-tx with PT [phone]) Time Calculation (min): 60 min and 27 min  Short Term Goals: Week 1:  OT Short Term Goal 1 (Week 1): Pt will complete bathing at min assist level sit <> stand OT Short Term Goal 2 (Week 1): Pt will complete UB dressing with min assist OT Short Term Goal 3 (Week 1): Pt will complete LB dressing with mod assist OT Short Term Goal 4 (Week 1): Pt will complete toilet transfer with min assist stand pivot OT Short Term Goal 5 (Week 1): Pt will demonstrate use of RUE in self-care tasks gross assist with min cues  Skilled Therapeutic Interventions/Progress Updates:    1) Pt seen for ADL retraining with focus on transfers, hemi-dressing technique, and use of RUE during functional tasks.  Pt continues to require mod-max cues for initiation of RUE in self-care tasks as pt overcompensates with LUE and mouth occasionally.  Sit > stand min assist this session with assistance mostly for stability as pt moves very quickly.  Bathing and dressing completed at sit <> stand level at sink with cues for RLE placement.  Engaged in NM re-ed in sitting and standing with focus on increased use of RUE with sustained grasp and grasp/release with [facility].  UE arm bike in sitting with focus on sustained grasp at level 5 alternating forward and backward 2 mins for total of 8 minutes.  Pt required cues to release grasp post activity.  [facility] activity with focus on increased use of RUE with stringing various size beads.  Pt with increased motor control as activity progressed.  2) Pt seen for co-tx with PT with focus on trunk control and trunk rotation in tall-kneeling and standing prior to gait training.  Pt with no active hip or knee flexion in RLE at beginning of session.  Tall-kneeling with focus on trunk extension, hip bumping, and reaching  outside BOS to attempt to initiate trunk rotation and extension.  Pt with decreased movement in tall-kneeling and quickly became frustrated.  Returned to sitting edge of mat where pt requested to focus on RUE function instead.  Engaged in reaching activity in standing with focus on trunk and hip rotation, pt able to elicit increased trunk rotation in standing and shoulder flexion to approx 120 degrees.  Post trunk rotation activity, engaged in ambulation with RW with pt with increased advancement of RLE and Rt knee flexion (see PT note for more info regarding gait).  Therapy Documentation Precautions:  Precautions Precautions: Fall Precaution Comments: Pt impulsive with poor awareness of deficits; Rt. inattention Restrictions Weight Bearing Restrictions: No General:   Vital Signs: Therapy Vitals BP: 144/94 mmHg Pain: Pain Assessment Pain Assessment: No/denies pain Pain Score: 0-No pain  See FIM for current functional status  Therapy/Group: Individual Therapy and Co-Treatment  Leonette Monarch 02/15/2013, 10:33 AM

## 2013-02-15 NOTE — Progress Notes (Signed)
Patient ID: [name], male   DOB: 11-04-1955, 57 y.o.   MRN: [id number] Subjective/Complaints: No Pain C/O, slept well, appetite ok A 12 point review of systems has been performed and if not noted above is otherwise negative.   Objective: Vital Signs: Blood pressure 119/78, pulse 70, temperature 97.6 F (36.4 C), temperature source Oral, resp. rate 19, height 5\' 9"  (1.753 m), weight 83.4 kg (183 lb 13.8 oz), SpO2 100.00%. No results found. No results found for this basename: WBC, HGB, HCT, PLT,  in the last 72 hours No results found for this basename: NA, K, CL, CO, GLUCOSE, BUN, CREATININE, CALCIUM,  in the last 72 hours CBG (last 3)  No results found for this basename: GLUCAP,  in the last 72 hours  Wt Readings from Last 3 Encounters:  02/10/13 83.4 kg (183 lb 13.8 oz)  02/03/13 83.915 kg (185 lb)  02/03/13 83.915 kg (185 lb)    Physical Exam:  HENT:  Head: Normocephalic.  Eyes: EOM are normal.  Neck: Neck supple. No thyromegaly present.  Cardiovascular: Normal rate and regular rhythm.  Pulmonary/Chest: Effort normal and breath sounds normal. No respiratory distress.  Abdominal: Soft. Bowel sounds are normal. He exhibits no distension.  Neurological: He is alert.  Patient with word finding deficits, verbal apraxia. He was able to state his name  Identified multiple objects for me. He did follow simple commands. RUE is tr to 3+/5 and inconsistent. RLE is trace to 1 at HF and KE and 0/5 distally with inconsistency as well and likely apraxia. Decreased sensation to light touch but could grossly sense pain.  Skin: Skin is warm and dry.  Psychiatric:  Very flat, rarely makes eye contact.   Assessment/Plan: 1. 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 Physiatrist is providing close team supervision and 24 hour management of active medical  problems listed below. Physiatrist and rehab team continue to assess barriers to discharge/monitor patient progress toward functional and medical goals. FIM: FIM - Bathing Bathing Steps Patient Completed: Chest;Right Arm;Abdomen;Left Arm;Front perineal area;Buttocks;Right upper leg;Left upper leg Bathing: 3: Mod-Patient completes 5-7 54f 10 parts or 50-74%  FIM - Upper Body Dressing/Undressing Upper body dressing/undressing steps patient completed: Thread/unthread right sleeve of pullover shirt/dresss;Thread/unthread left sleeve of pullover shirt/dress;Put head through opening of pull over shirt/dress;Pull shirt over trunk Upper body dressing/undressing: 5: Supervision: Safety issues/verbal cues FIM - Lower Body Dressing/Undressing Lower body dressing/undressing steps patient completed: Thread/unthread left underwear leg;Pull underwear up/down;Don/Doff right sock;Don/Doff left sock Lower body dressing/undressing: 3: Mod-Patient completed 50-74% of tasks  FIM - Toileting Toileting steps completed by patient: Adjust clothing prior to toileting;Adjust clothing after toileting Toileting Assistive Devices: Grab bar or rail for support Toileting: 3: Mod-Patient completed 2 of 3 steps  FIM - Diplomatic Services operational officer Devices: Grab bars Toilet Transfers: 2-To toilet/BSC: Max A (lift and lower assist);3-From toilet/BSC: Mod A (lift or lower assist)  FIM - Banker Devices: Bed rails Bed/Chair Transfer: 3: Chair or W/C > Bed: Mod A (lift or lower assist)  FIM - Locomotion: Wheelchair Distance: 120 Locomotion: Wheelchair: 4: Travels 150 ft or more: maneuvers on rugs and over door sillls with minimal assistance (Pt.>75%) FIM - Locomotion: Ambulation Locomotion: Ambulation Assistive Devices: Designer, industrial/product Ambulation/Gait Assistance: 1: +2 Total assist Locomotion: Ambulation: 0: Activity did not occur  Comprehension Comprehension Mode:  Auditory Comprehension: 4-Understands basic 75 -  89% of the time/requires cueing 10 - 24% of the time  Expression Expression Mode: Verbal Expression: 3-Expresses basic 50 - 74% of the time/requires cueing 25 - 50% of the time. Needs to repeat parts of sentences.  Social Interaction Social Interaction: 4-Interacts appropriately 75 - 89% of the time - Needs redirection for appropriate language or to initiate interaction.  Problem Solving Problem Solving: 3-Solves basic 50 - 74% of the time/requires cueing 25 - 49% of the time  Memory Memory: 3-Recognizes or recalls 50 - 74% of the time/requires cueing 25 - 49% of the time  Medical Problem List and Plan:  1. 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 DVT Prophylaxis/Anticoagulation: Subcutaneous Lovenox. Monitor platelet counts any signs of bleeding  3. Neuropsych: This patient is not capable of making decisions on his/her own behalf.   -neuropsych assessment for mood 4. Hypertension. No present antihypertensive medication. Patient on hydrochlorothiazide 25 mg daily prior to admission. On low dose ACE to start/ don't want to lower pressure too much--continue with current regimen 5. Tobacco/marijuana abuse. Continue NicoDerm patch and provide counseling  6. Hyperlipidemia. Lipitor   LOS (Days) 6 A FACE TO FACE EVALUATION WAS PERFORMED  Erick Colace 02/15/2013 7:56 AM

## 2013-02-16 ENCOUNTER — Inpatient Hospital Stay (HOSPITAL_COMMUNITY): Payer: 59 | Admitting: Physical Therapy

## 2013-02-16 ENCOUNTER — Inpatient Hospital Stay (HOSPITAL_COMMUNITY): Payer: 59 | Admitting: Speech Pathology

## 2013-02-16 ENCOUNTER — Inpatient Hospital Stay (HOSPITAL_COMMUNITY): Payer: 59 | Admitting: Occupational Therapy

## 2013-02-16 DIAGNOSIS — I6340 Cerebral infarction due to embolism of unspecified cerebral artery: Secondary | ICD-10-CM

## 2013-02-16 DIAGNOSIS — F323 Major depressive disorder, single episode, severe with psychotic features: Secondary | ICD-10-CM

## 2013-02-16 DIAGNOSIS — I10 Essential (primary) hypertension: Secondary | ICD-10-CM

## 2013-02-16 LAB — CREATININE, SERUM
Creatinine, Ser: 0.98 mg/dL (ref 0.50–1.35)
GFR calc Af Amer: >90 mL/min (ref >90)
GFR calc non Af Amer: >90 mL/min (ref >90)

## 2013-02-16 NOTE — Progress Notes (Signed)
Occupational Therapy Session Note  Patient Details  Name: [name] MRN: [id number] Date of Birth: [birth year]  Today's Date: 02/16/2013 Time: 0930-1030 and [id number] (co-tx with PT [phone]) Time Calculation (min): 60 min and 15 min  Short Term Goals: Week 1:  OT Short Term Goal 1 (Week 1): Pt will complete bathing at min assist level sit <> stand OT Short Term Goal 2 (Week 1): Pt will complete UB dressing with min assist OT Short Term Goal 3 (Week 1): Pt will complete LB dressing with mod assist OT Short Term Goal 4 (Week 1): Pt will complete toilet transfer with min assist stand pivot OT Short Term Goal 5 (Week 1): Pt will demonstrate use of RUE in self-care tasks gross assist with min cues  Skilled Therapeutic Interventions/Progress Updates:    1) Pt seen for ADL retraining with focus on transfers, hemi-dressing technique, and use of RUE during functional tasks. Pt completed bathing at sit <> stand level in shower.  Pt continues to require max verbal cues and hand over hand assist to incorporate RUE in bathing tasks.  Stand pivot transfer into walk-in shower with min-mod assist, requiring setup of RLE prior to transfer and control with descent to shower chair.  Pt with 1 instance of impulsivity this session with attempting to stand in shower without assistance and RLE slid out from under him due to poor positioning.  Pt required max assist to regain balance and return to sitting.  Pt with increased carryover of hemi-dressing technique with wanting to don RLE first but required steady assist to cross RLE over knee and maintain it there.  Pt demonstrated use of RUE with brushing teeth with Rt hand this session.  Engaged in RUE task in standing with focus on scanning environment for card sorting task, integrated having pt name cards while sorting them.  Pt with no inattention to Rt side this session and was able to coordinate RUE movements more smoothly this session.  However did note some  overcompensation of bending at waist to reach instead of eliciting shoulder flexion and elbow extension.  2) Pt seen for co-tx with PT with focus on reciprocal, automatic activation of RUE and RLE in familiar tasks. Pt seated EOB and ready to participate upon arrival.  Pt with improved clearance of RLE during ambulating, however unable to isolate hip flexion or knee extension in seated or static standing position.  Pt tends to lift Rt leg with UE, provided pt with object to hold with BUE to allow for focus on muscle activation through RLE.  UE activity with catching and tossing ball, with pt demonstrating appropriate activation of RUE to catch ball but was limited by tone which is impairing his shoulder flexion and elbow extension when attempting to throw the ball.  Attempted to set up activity to increase RUE activation, however still limited by apraxia and increased tone.    Therapy Documentation Precautions:  Precautions Precautions: Fall Precaution Comments: Pt impulsive with poor awareness of deficits; Rt. inattention Restrictions Weight Bearing Restrictions: No General:   Vital Signs: Therapy Vitals Pulse Rate: 135 (after NUstep) BP: 117/74 mmHg Patient Position, if appropriate: Sitting Oxygen Therapy SpO2: 98 % Pain: Pain Assessment Pain Assessment: No/denies pain  See FIM for current functional status  Therapy/Group: Individual Therapy and Co-Treatment  Leonette Monarch 02/16/2013, 1:18 PM

## 2013-02-16 NOTE — Progress Notes (Signed)
Patient ID: [name], male   DOB: May 12, 1956, 57 y.o.   MRN: [id number] Subjective/Complaints: No Pain C/O, slept well, appetite ok A 12 point review of systems has been performed and if not noted above is otherwise negative.   Objective: Vital Signs: Blood pressure 142/91, pulse 68, temperature 98 F (36.7 C), temperature source Oral, resp. rate 17, height 5\' 9"  (1.753 m), weight 83.4 kg (183 lb 13.8 oz), SpO2 98.00%. No results found. No results found for this basename: WBC, HGB, HCT, PLT,  in the last 72 hours  Recent Labs  02/16/13 0510  CREATININE 0.98   CBG (last 3)  No results found for this basename: GLUCAP,  in the last 72 hours  Wt Readings from Last 3 Encounters:  02/10/13 83.4 kg (183 lb 13.8 oz)  02/03/13 83.915 kg (185 lb)  02/03/13 83.915 kg (185 lb)    Physical Exam:  HENT:  Head: Normocephalic.  Eyes: EOM are normal.  Neck: Neck supple. No thyromegaly present.  Cardiovascular: Normal rate and regular rhythm.  Pulmonary/Chest: Effort normal and breath sounds normal. No respiratory distress.  Abdominal: Soft. Bowel sounds are normal. He exhibits no distension.  Neurological: He is alert.  Patient with word finding deficits, verbal apraxia. He was able to state his name  Identified multiple objects for me. He did follow simple commands. RUE is tr to 3+/5 and inconsistent. RLE is trace to 1 at HF and KE and 0/5 distally with inconsistency as well and likely apraxia. Decreased sensation to light touch but could grossly sense pain.  Skin: Skin is warm and dry.  Psychiatric:  Very flat, rarely makes eye contact.   Assessment/Plan: 1. 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 Physiatrist is providing close team supervision and 24 hour management of active medical problems listed below. Physiatrist and rehab team continue to  assess barriers to discharge/monitor patient progress toward functional and medical goals. FIM: FIM - Bathing Bathing Steps Patient Completed: Chest;Right Arm;Abdomen;Front perineal area;Buttocks;Right upper leg;Left upper leg Bathing: 3: Mod-Patient completes 5-7 68f 10 parts or 50-74%  FIM - Upper Body Dressing/Undressing Upper body dressing/undressing steps patient completed: Thread/unthread right sleeve of pullover shirt/dresss;Thread/unthread left sleeve of pullover shirt/dress;Put head through opening of pull over shirt/dress;Pull shirt over trunk Upper body dressing/undressing: 5: Supervision: Safety issues/verbal cues FIM - Lower Body Dressing/Undressing Lower body dressing/undressing steps patient completed: Thread/unthread right underwear leg;Thread/unthread left underwear leg;Pull underwear up/down;Thread/unthread right pants leg;Thread/unthread left pants leg;Pull pants up/down;Don/Doff right sock;Don/Doff left sock Lower body dressing/undressing: 3: Mod-Patient completed 50-74% of tasks  FIM - Toileting Toileting steps completed by patient: Adjust clothing prior to toileting;Adjust clothing after toileting Toileting Assistive Devices: Grab bar or rail for support Toileting: 3: Mod-Patient completed 2 of 3 steps  FIM - Diplomatic Services operational officer Devices: Grab bars Toilet Transfers: 2-To toilet/BSC: Max A (lift and lower assist);3-From toilet/BSC: Mod A (lift or lower assist)  FIM - Bed/Chair Transfer Bed/Chair Transfer Assistive Devices: Arm rests Bed/Chair Transfer: 5: Supine > Sit: Supervision (verbal cues/safety issues);4: Sit > Supine: Min A (steadying pt. > 75%/lift 1 leg);3: Bed > Chair or W/C: Mod A (lift or lower assist);3: Chair or W/C > Bed: Mod A (lift or lower assist)  FIM - Locomotion: Wheelchair Distance: 150 Locomotion: Wheelchair: 3: Travels 150 ft or more: maneuvers on rugs and over door sills with moderate assistance  (Pt: 50 - 74%) FIM -  Locomotion: Ambulation Locomotion: Ambulation Assistive Devices: Designer, industrial/product Ambulation/Gait Assistance: 1: +2 Total assist Locomotion: Ambulation: 0: Activity did not occur  Comprehension Comprehension Mode: Auditory Comprehension: 4-Understands basic 75 - 89% of the time/requires cueing 10 - 24% of the time  Expression Expression Mode: Verbal Expression: 3-Expresses basic 50 - 74% of the time/requires cueing 25 - 50% of the time. Needs to repeat parts of sentences.  Social Interaction Social Interaction: 5-Interacts appropriately 90% of the time - Needs monitoring or encouragement for participation or interaction.  Problem Solving Problem Solving: 4-Solves basic 75 - 89% of the time/requires cueing 10 - 24% of the time  Memory Memory: 3-Recognizes or recalls 50 - 74% of the time/requires cueing 25 - 49% of the time  Medical Problem List and Plan:  1. 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 DVT Prophylaxis/Anticoagulation: Subcutaneous Lovenox. Monitor platelet counts any signs of bleeding  3. Neuropsych: This patient is not capable of making decisions on his/her own behalf.   -neuropsych assessment for mood 4. Hypertension. No present antihypertensive medication. Patient on hydrochlorothiazide 25 mg daily prior to admission. On low dose ACE to start/ don't want to lower pressure too much--continue with current regimen 5. Tobacco/marijuana abuse. Continue NicoDerm patch and provide counseling  6. Hyperlipidemia. Lipitor   LOS (Days) 7 A FACE TO FACE EVALUATION WAS PERFORMED  Erick Colace 02/16/2013 8:31 AM

## 2013-02-16 NOTE — Progress Notes (Signed)
Physical Therapy Weekly Progress Note  Patient Details  Name: [name] MRN: [id number] Date of Birth: 1956/05/25  Today's Date: 02/16/2013 Time: 1105-1200 and 5784-6962 (full time 9528-4132) Time Calculation (min): 55 min (20 minutes; 35 min co-treat with OT)  Patient has made good progress and has met 4 of 4 short term goals.  Patient has shown dramatic improvements in RLE initiation, activation and sequencing for transfers and gait but is still not able to activate consistently.  Patient is currently supervision-min A for bed mobility, min-mod A for bed <> w/c transfers, mod-max A for w/c mobility secondary to overcompensation with LUE and poor sequencing, min-mod A for gait with RW and stair negotiation.    Patient continues to demonstrate the following deficits: impaired R sided strength, motor control, timing, sequencing, coordination, initiation, motor planning, impaired attention and awareness, impaired dynamic balance, gait and therefore will continue to benefit from skilled PT intervention to enhance overall performance with balance, ability to compensate for deficits, functional use of  right upper extremity and right lower extremity, attention, awareness and coordination.  Patient progressing toward long term goals..  Plan of care revisions: LTG upgraded secondary to progress.  PT Short Term Goals Week 1:  PT Short Term Goal 1 (Week 1): Patient will perform bed mobility from flat bed, no rails to L and R with min A and improved attention and initiation with RUE and RLE PT Short Term Goal 1 - Progress (Week 1): Met PT Short Term Goal 2 (Week 1): Patient will peformed bed <> w/c transfers mod A to L and R with improved attention and initiation of RLE to pivot with 75% cues PT Short Term Goal 2 - Progress (Week 1): Met PT Short Term Goal 3 (Week 1): Patient will perform w/c mobility x 150' in controlled environment with L hemi technique and max A with 75% cues for sequencing and  attention to R PT Short Term Goal 3 - Progress (Week 1): Met PT Short Term Goal 4 (Week 1): Patient will perform gait in controlled environment x 50' with LRAD and max A of one person and improved initiation and advancement of RLE with 75% cues PT Short Term Goal 4 - Progress (Week 1): Met Week 2:  PT Short Term Goal 1 (Week 2): Patient will perform bed <> w/c transfers with consistent min A and improved RLE advancement and safe placement during pivoting PT Short Term Goal 2 (Week 2): Patient will perform w/c mobility x 150' in controlled environment with min A and improved coordination of LE propulsion and attention to R side PT Short Term Goal 3 (Week 2): Patient will perform gait in controlled environment with RW x 150' with min A and improved attention to R and improved ability to advance RLE during changes in direction PT Short Term Goal 4 (Week 2): Patient will perform stair negotiation up and down 4 stairs with 2 rails and min A with improved activation and advancement of RLE with 50% cues  Skilled Therapeutic Interventions/Progress Updates:   Patient resting in bed; performed supine > sit from bed with HOB elevated and bed rail with supervision but verbal cues to attend to RLE and to bring it to EOB.  Pt requesting to ambulate to gym.  Performed ambulation in controlled environment x 150' with RW and min A still demonstrating improved RLE activation and swing phase advancement but does not maintain safe distance to RW and requires min A for safe management of RW around obstacles and during  changes in direction.  Performed bilat UE and LE coordination, endurance and strengthening on Nustep at level 5 x 4 min + 4 min with one rest break to assess HR and BP.  HR elevated after Nustep; allowed HR time to lower.  Attempted stair negotiation with cues to perform step to sequence and ascend each time with RLE; despite being able to advance RLE during gait patient unable to advance RLE to step.  Performed  NMR, trunk rotation activity in standing; see details below.  Performed gait with RW in controlled environment x 150' back to room still with full step and stride length with verbal cues to maintain feet within RW with improved RW management during straight navigation and direction changes.    Second session: Co treatment with OT with focus on NMR for reciprocal, automatic activation of RUE and RLE.  Performed gait in controlled environment x 150' x 2 with RW and min-mod A with manual facilitation for lateral weight shifting and anterior pelvic rotation R pelvis and cues to maintain feet within RW.  Attempted use of automatic foot tapping to step, to ball and ball kicks to illicit RLE hip flexion and knee flexion <> extension (to assist with transfers in/out of bed, tub, putting pants or shoes on)  in sitting but unable to illicit full ROM.  In standing attempted to illicit automatic hip flexion during ball kicks in standing but unable to without UE support; added RW and attempted ball kicks again; patient unable to in static standing and required forward momentum of stepping forwards with LLE first to activate swing of RLE.  Transitioned to stair negotiation with bilat UE support on rails performing alternating sequence up/down 5 stairs x 2 reps with +2 for safety with focus on maintaining forward momentum to facilitate activation of hip and knee flexion <> extension during automatic stepping.  Increased difficulty advancing RLE during descent secondary to tone and toe hanging on step.  Returned to room and to w/c with min-mod A.     Therapy Documentation Precautions:  Precautions Precautions: Fall Precaution Comments: Pt impulsive with poor awareness of deficits; Rt. inattention Restrictions Weight Bearing Restrictions: No Vital Signs: Therapy Vitals Pulse Rate: 135 (after NUstep) BP: 117/74 mmHg Patient Position, if appropriate: Sitting Oxygen Therapy SpO2: 98 % Pain: Pain Assessment Pain  Assessment: No/denies pain Other Treatments: Treatments Neuromuscular Facilitation: Right;Upper Extremity;Lower Extremity;Forced use;Activity to increase motor control;Activity to increase timing and sequencing;Activity to increase sustained activation;Activity to increase lateral weight shifting;Activity to increase anterior-posterior weight shifting;Activity to increase coordination in standing during 2 sets x 12 reps reaching with RUE in PNF diagonal patterns for horseshoes low and to the L and placing them on tall target forwards and to the R with min A and verbal cues for sequencing; changed to reaching high and to the L to remove horseshoes from tall target and placing them back on low target down and the to R with min-mod A for balance and sequencing with 1-2 rest breaks secondary to fatigue and frustration.    See FIM for current functional status  Therapy/Group: Individual Therapy and Co-Treatment  Edman Circle [facility] 02/16/2013, 12:31 PM

## 2013-02-16 NOTE — Progress Notes (Signed)
Social Work Patient ID: Vanessa Alesi, male   DOB: 1955/12/15, 57 y.o.   MRN: [id number] Jovita Gamma pt copy of disability form-original sent to [employer].  Phone interview set up for June 20 at 11:00 to contact wife's cell phone. Also gave pt handicapped form for car, wife to follow up with and pay fee for handicapped placard.  Pt reports having a good day.

## 2013-02-16 NOTE — Progress Notes (Signed)
Speech Language Pathology Daily Session Note  Patient Details  Name: [name] MRN: [id number] Date of Birth: August 17, 1956  Today's Date: 02/16/2013 Time: 4132-4401 Time Calculation (min): 40 min  Short Term Goals: Week 1: SLP Short Term Goal 1 (Week 1): Pt will utilize call bell to request assistance with Min A verbal and question cues.  SLP Short Term Goal 2 (Week 1): Pt will utilize external memory aids to increase recall/carryover of newly learned informaiton with Min A verbal and question cues.  SLP Short Term Goal 3 (Week 1): Pt will demonstrate functional problem solving with basic and familair tasks with Min A verbal and question cues.  SLP Short Term Goal 4 (Week 1): Pt will self-monitor and correct verbal errors at the sentence level with Min A verbal cues. SLP Short Term Goal 5 (Week 1): Pt will utilize word-finding strategies with Min A verbal and question cues.   Skilled Therapeutic Interventions: Skilled treatment session focused on cognitive-linguistic goals.  Patient described objects, people or places after reading a card with the given word with Mod assist.  SLP also facilitated session with cues to name a category and three items within that category with Mod assist.  SLP requested patient recall 3 things that happened in recent past in therapies and patient required Max assist question cues to recall events.   FIM:  Comprehension Comprehension: 4-Understands basic 75 - 89% of the time/requires cueing 10 - 24% of the time Expression Expression: 3-Expresses basic 50 - 74% of the time/requires cueing 25 - 50% of the time. Needs to repeat parts of sentences. Social Interaction Social Interaction: 5-Interacts appropriately 90% of the time - Needs monitoring or encouragement for participation or interaction. Problem Solving Problem Solving: 4-Solves basic 75 - 89% of the time/requires cueing 10 - 24% of the time Memory Memory: 3-Recognizes or recalls 50 - 74% of the  time/requires cueing 25 - 49% of the time  Pain Pain Assessment Pain Assessment: No/denies pain  Therapy/Group: Individual Therapy  Charlane Ferretti., CCC-SLP [phone]  Klaudia Beirne 02/16/2013, 1:13 PM

## 2013-02-17 ENCOUNTER — Inpatient Hospital Stay (HOSPITAL_COMMUNITY): Payer: 59 | Admitting: Speech Pathology

## 2013-02-17 ENCOUNTER — Inpatient Hospital Stay (HOSPITAL_COMMUNITY): Payer: 59 | Admitting: Occupational Therapy

## 2013-02-17 ENCOUNTER — Inpatient Hospital Stay (HOSPITAL_COMMUNITY): Payer: 59 | Admitting: Physical Therapy

## 2013-02-17 NOTE — Progress Notes (Signed)
Speech Language Pathology Daily Session Note & Weekly Progress Note  Patient Details  Name: [name] MRN: [id number] Date of Birth: [birth year]  Today's Date: 02/17/2013 Time: 4098-1191 Time Calculation (min): 45 min  Short Term Goals: Week 1: SLP Short Term Goal 1 (Week 1): Pt will utilize call bell to request assistance with Min A verbal and question cues.  SLP Short Term Goal 2 (Week 1): Pt will utilize external memory aids to increase recall/carryover of newly learned informaiton with Min A verbal and question cues.  SLP Short Term Goal 3 (Week 1): Pt will demonstrate functional problem solving with basic and familair tasks with Min A verbal and question cues.  SLP Short Term Goal 4 (Week 1): Pt will self-monitor and correct verbal errors at the sentence level with Min A verbal cues. SLP Short Term Goal 5 (Week 1): Pt will utilize word-finding strategies with Min A verbal and question cues.   Skilled Therapeutic Interventions: Skilled treatment session focused on cognitive-linguistic goals.  SLP facilitated session with Min assist verbal and visual cues to safely transfer from bed to chair due to decresed safety awareness and impulsivity.  Patient also required Min assist verbal cues to attend to right upper extremity throughout session.  Patient participated in a new learning task with Mod faded to Min assist verbal cues to utilize written aids as needed to recall prcedures, self-monitor and correct self. Patient also sequenced 4 picture cards and descrided steps with Mod assist verbal cues to utilize compensatory strategies.     FIM:  Comprehension Comprehension: 4-Understands basic 75 - 89% of the time/requires cueing 10 - 24% of the time Expression Expression: 3-Expresses basic 50 - 74% of the time/requires cueing 25 - 50% of the time. Needs to repeat parts of sentences. Social Interaction Social Interaction: 5-Interacts appropriately 90% of the time - Needs monitoring or  encouragement for participation or interaction. Problem Solving Problem Solving: 3-Solves basic 50 - 74% of the time/requires cueing 25 - 49% of the time Memory Memory: 3-Recognizes or recalls 50 - 74% of the time/requires cueing 25 - 49% of the time  Pain Pain Assessment Pain Assessment: No/denies pain Pain Score: 0-No pain Patients Stated Pain Goal: 0 Multiple Pain Sites: No  Therapy/Group: Individual Therapy   Speech Language Pathology Weekly Progress Note  Patient Details  Name: [name] MRN: [id number] Date of Birth: [birth year]  Today's Date: 02/17/2013  Short Term Goals: Week 1: SLP Short Term Goal 1 (Week 1): Pt will utilize call bell to request assistance with Min A verbal and question cues.  SLP Short Term Goal 1 - Progress (Week 1): Progressing toward goal SLP Short Term Goal 2 (Week 1): Pt will utilize external memory aids to increase recall/carryover of newly learned informaiton with Min A verbal and question cues.  SLP Short Term Goal 2 - Progress (Week 1): Progressing toward goal SLP Short Term Goal 3 (Week 1): Pt will demonstrate functional problem solving with basic and familair tasks with Min A verbal and question cues.  SLP Short Term Goal 3 - Progress (Week 1): Met SLP Short Term Goal 4 (Week 1): Pt will self-monitor and correct verbal errors at the sentence level with Min A verbal cues. SLP Short Term Goal 4 - Progress (Week 1): Progressing toward goal SLP Short Term Goal 5 (Week 1): Pt will utilize word-finding strategies with Min A verbal and question cues.  SLP Short Term Goal 5 - Progress (Week 1): Progressing toward goal Week 2: SLP Short  Term Goal 1 (Week 2): Pt will utilize call bell to request assistance with Min level verbal and question cues.  SLP Short Term Goal 2 (Week 2): Pt will utilize external memory aids to increase recall/carryover of newly learned informaiton with Min A verbal and question cues.  SLP Short Term Goal 3 (Week 2): Pt will  demonstrate functional problem solving with basic and familair tasks with Supervisoin level verbal and question cues.  SLP Short Term Goal 4 (Week 2): Pt will self-monitor and correct verbal errors at the sentence level with Min A verbal cues. SLP Short Term Goal 5 (Week 2): Pt will utilize word-finding strategies with Min A verbal and question cues.   Weekly Progress Updates: Patient met 1 out of his 5 short term goals this reporting period due to gains in functional problem solving with basic, daily tasks.  Patient has also made gains in verbal and receptive expression as well as overall cognition; however, not enough to be a functional gain due to the severity of his deficits, impulsivity and decreased self-monitoring and correcting.  Patient becomes frustrated and often times he appears to not be open to clinician cues for word finding and correcting verbal errors as they occur.   Patient continues to demonstrate significantly impaired cognitive-linguistic abilities and continues to require skilled SLP service to address these deficits to reduce burden of care prior to discharge home with 24/7 Supervision from wife.    SLP Intensity: Minumum of 1-2 x/day, 30 to 90 minutes SLP Frequency: 5 out of 7 days SLP Duration/Estimated Length of Stay: 1-2 weeks SLP Treatment/Interventions: Cognitive remediation/compensation;Cueing hierarchy;Functional tasks;Environmental controls;Internal/external aids;Speech/Language facilitation;Therapeutic Activities;Patient/family education  Charlane Ferretti., CCC-SLP [phone]  Metztli Sachdev 02/17/2013, 11:10 AM

## 2013-02-17 NOTE — Progress Notes (Signed)
Occupational Therapy Weekly Progress Note  Patient Details  Name: [name] MRN: [id number] Date of Birth: 04/09/1956  Today's Date: 02/17/2013 Time: 0830-0930 Time Calculation (min): 60 min  Patient has met 4 of 5 short term goals.  Pt is demonstrating steady progress towards goals, with improvements in RUE motor control and motor planning, RLE initiation and sequencing with transfers and functional ambulation.  Pt currently requires min assist stand pivot bed <> w/c and mod assist gait with RW with mod assist transfer to/from toilet and walk-in shower.  Pt continues to require verbal cues to attend to RUE during functional tasks as he tends to compensate with LUE and mouth to provide assistance.  Pt with adequate motor control of RUE, however requires cues to initiate.  Patient continues to demonstrate the following deficits: impaired Rt sided strength, impaired initiation, motor planning, motor control, timing, sequencing, and coordination, impaired attention and awareness, impaired dynamic sitting and standing balance and therefore will continue to benefit from skilled OT intervention to enhance overall performance with BADL and Reduce care partner burden.  Patient progressing toward long term goals..  Continue plan of care.  OT Short Term Goals Week 1:  OT Short Term Goal 1 (Week 1): Pt will complete bathing at min assist level sit <> stand OT Short Term Goal 1 - Progress (Week 1): Met OT Short Term Goal 2 (Week 1): Pt will complete UB dressing with min assist OT Short Term Goal 2 - Progress (Week 1): Met OT Short Term Goal 3 (Week 1): Pt will complete LB dressing with mod assist OT Short Term Goal 3 - Progress (Week 1): Met OT Short Term Goal 4 (Week 1): Pt will complete toilet transfer with min assist stand pivot OT Short Term Goal 4 - Progress (Week 1): Progressing toward goal OT Short Term Goal 5 (Week 1): Pt will demonstrate use of RUE in self-care tasks gross assist with min  cues OT Short Term Goal 5 - Progress (Week 1): Met Week 2:  OT Short Term Goal 1 (Week 2): Pt will complete toilet transfer with min assist stand pivot OT Short Term Goal 2 (Week 2): Pt will complete shower transfer with min assist OT Short Term Goal 3 (Week 2): Pt will complete LB dressing with min assist OT Short Term Goal 4 (Week 2): Pt will complete 2 grooming tasks in standing with min assist for balance  Skilled Therapeutic Interventions/Progress Updates:    Pt seen for ADL retraining with focus on transfers, hemi-dressing technique, and use of RUE during functional tasks. Pt completed bathing at sit <> stand level in shower. Pt continues to require max verbal cues and hand over hand assist to incorporate RUE in bathing tasks. Ambulated to walk-in shower with min-mod assist with RW, requiring max cues for transfer secondary to RLE placement. Pt with increased carryover of hemi-dressing technique with wanting to don RLE first but required steady assist to cross RLE over knee and maintain it there with RUE. Pt demonstrated use of RUE with brushing teeth with Rt hand this session. Engaged in gross motor NM re-ed with weight bearing in standing with cues at hips for proper technique.  Pt with decreased trunk control and would overcompensate with trunk and neck flexion.  Trunk rotation in sitting with focus on RUE activation in PNF pattern reaching with weighted ball, cues for sequencing and technique.   Therapy Documentation Precautions:  Precautions Precautions: Fall Precaution Comments: Pt impulsive with poor awareness of deficits; Rt. inattention Restrictions  Weight Bearing Restrictions: No Pain: Pain Assessment Pain Assessment: No/denies pain Pain Score: 0-No pain Patients Stated Pain Goal: 0 Multiple Pain Sites: No ADL: ADL Grooming: Setup Where Assessed-Grooming: Sitting at sink Upper Body Bathing: Minimal assistance Where Assessed-Upper Body Bathing: Shower Lower Body Bathing:  Minimal assistance Where Assessed-Lower Body Bathing: Shower Upper Body Dressing: Setup;Minimal cueing Where Assessed-Upper Body Dressing: Sitting at sink Lower Body Dressing: Moderate assistance Where Assessed-Lower Body Dressing: Sitting at sink;Standing at sink Toileting: Minimal assistance Where Assessed-Toileting: Teacher, adult education: Moderate assistance Toilet Transfer Method: Stand pivot Film/video editor: Moderate assistance Film/video editor Method: Warden/ranger: Shower seat with back;Grab bars  See FIM for current functional status  Therapy/Group: Individual Therapy  Leonette Monarch 02/17/2013, 11:16 AM

## 2013-02-17 NOTE — Progress Notes (Signed)
Physical Therapy Session Note  Patient Details  Name: [name] MRN: [id number] Date of Birth: [birth year]  Today's Date: 02/17/2013 Time: 6644-0347 Time Calculation (min): 58 min  Short Term Goals: Week 1:  PT Short Term Goal 1 (Week 1): Patient will perform bed mobility from flat bed, no rails to L and R with min A and improved attention and initiation with RUE and RLE PT Short Term Goal 1 - Progress (Week 1): Met PT Short Term Goal 2 (Week 1): Patient will peformed bed <> w/c transfers mod A to L and R with improved attention and initiation of RLE to pivot with 75% cues PT Short Term Goal 2 - Progress (Week 1): Met PT Short Term Goal 3 (Week 1): Patient will perform w/c mobility x 150' in controlled environment with L hemi technique and max A with 75% cues for sequencing and attention to R PT Short Term Goal 3 - Progress (Week 1): Met PT Short Term Goal 4 (Week 1): Patient will perform gait in controlled environment x 50' with LRAD and max A of one person and improved initiation and advancement of RLE with 75% cues PT Short Term Goal 4 - Progress (Week 1): Met Week 2:  PT Short Term Goal 1 (Week 2): Patient will perform bed <> w/c transfers with consistent min A and improved RLE advancement and safe placement during pivoting PT Short Term Goal 2 (Week 2): Patient will perform w/c mobility x 150' in controlled environment with min A and improved coordination of LE propulsion and attention to R side PT Short Term Goal 3 (Week 2): Patient will perform gait in controlled environment with RW x 150' with min A and improved attention to R and improved ability to advance RLE during changes in direction PT Short Term Goal 4 (Week 2): Patient will perform stair negotiation up and down 4 stairs with 2 rails and min A with improved activation and advancement of RLE with 50% cues  Therapy Documentation Precautions:  Precautions Precautions: Fall Precaution Comments: Pt impulsive with poor awareness  of deficits; Rt. inattention Restrictions Weight Bearing Restrictions: No Vital Signs: Therapy Vitals Temp: 98.3 F (36.8 C) Temp src: Oral Pulse Rate: 79 Resp: 18 BP: 135/86 mmHg Patient Position, if appropriate: Lying Oxygen Therapy SpO2: 100 % O2 Device: None (Room air) Pain: Pain Assessment Pain Assessment: No/denies pain Locomotion : Ambulation Ambulation/Gait Assistance: 4: Min assist with RW x 150' x 2 reps with focus on lateral weight shifting, trunk rotation and safe management of RW in controlled environment.   Other Treatments: Treatments Therapeutic Activity: Performed sit <> stand sequence training from mat with focus on sequencing of R foot placement and R hand placement on mat prior to stand to allow RUE to assist with pushing off mat to stand and sequence of releasing RW with RUE and reaching back for seat prior to sitting.  Performed repetition of sequence x 10-12 reps with patient demonstrating and verbalizing sequence.  At end of session patient continued to require verbal cues to attend to RUE and RLE positioning during sit <> stand and to release RW.    Neuromuscular Facilitation: Right;Upper Extremity;Lower Extremity;Forced use;Activity to increase coordination;Activity to increase motor control;Activity to increase timing and sequencing;Activity to increase sustained activation;Activity to increase lateral weight shifting;Activity to increase anterior-posterior weight shifting beginning in quadruped with mod-max A to maintain RUE extension and placement during anterior weight shifts over bilat UE during alternating UE reaching forwards and diagonal to targets to facilitate forwards and  R lateral weight shifting with two episodes of LOB and falling into prone.  Also performed weight shifting and activation of RLE and RUE during forwards and retro advancement (reciprocal crawling) with mod-max A for weight shifting to allow RLE to extend backwards.  Transitioned to  standing at wall with bilat UE support on wall rail during R and L lateral stepping with sustained squat > stand for weight shifting, sustained activation in RLE while minimizing extensor tone and synergy, sequencing and patient attending to and remembering multiple commands.  Required multiple sitting rest breaks secondary to fatigue.  Returned to room to w/c to rest before next session.     See FIM for current functional status  Therapy/Group: Individual Therapy  Edman Circle [facility] 02/17/2013, 4:49 PM

## 2013-02-17 NOTE — Patient Care Conference (Signed)
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 CVA (cerebral infarction) 02/03/2013  . HTN (hypertension) 02/03/2013  . Tobacco abuse 02/03/2013    Expected Discharge Date: Expected Discharge Date: 03/02/13  Team Members Present: Physician leading conference: Dr. Claudette Laws Nurse Present: Gregor Hams, RN PT Present: Edman Circle, PT;Caroline Adriana Simas, PT;Other (comment) Clarisse Gouge Ripa-PT) OT Present: Bretta Bang, Verlene Mayer, OT;Other (comment) Dorathy Daft Perkinson-OT) SLP Present: Fae Pippin, SLP Other (Discipline and Name): Charolette Child Coordinator     Current Status/Progress Goal Weekly Team Focus  Medical   ACA infarct with lower extremity greater than upper extremity weakness, hypertension now controlled, BP is actually on the low side  Maintain normotensive range  Adjust meds avoid hypotension   Bowel/Bladder   Currently continent with use of urinal and toileting, question spillage of urinal on occassion vs incontinent  continent with toileting for bowel and bladder and use of urinal      Swallow/Nutrition/ Hydration   regular and thin         ADL's   min-mod assist bathing, supervision UB dressing, mod assist LB dressing, mod assist stand pivot transfers, mod assist toileting  supervision overall, min assist shower and toilet transfers.  May upgrade transfers to  supervision depending on how mobility improves.  NM re-ed, RUE functional use, automatic tasks to decrease apraxia   Mobility   min-mod A basic transfers and gait with RW, mod-max A w/c and stairs  supervision-min A overall  Initiation, activation and sequencing of RLE during functional mobility, gait   Communication   Mod assist  Supervision   increase self-monitoring and correcting   Safety/Cognition/ Behavioral Observations  bed alarm, side rails up x 3, Quick release belt on when up in chair  no falls without injury  increase use of external aids   Pain   no complaint of pain  0      Skin   psorasis of chest  new new skin issues/breakdown         *See Care Plan and progress notes for long and short-term goals.  Barriers to Discharge: Still requires physical assistance for mobility    Possible Resolutions to Barriers:  Upgrade functional , training caregiver    Discharge Planning/Teaching Needs:  Home with wife who works from home and can provide assist      Team Discussion:  BP issues-adjusting meds.  HR running high-monitor this.  Making good progress.  Cont of B & B-occasionally spills his urinal.  Revisions to Treatment Plan:  None   Continued Need for Acute Rehabilitation Level of Care: The patient requires daily medical management by a physician with specialized training in physical medicine and rehabilitation for the following conditions: Daily direction of a multidisciplinary physical rehabilitation program to ensure safe treatment while eliciting the highest outcome that is of practical value to the patient.: Yes Daily medical management of patient  stability for increased activity during participation in an intensive rehabilitation regime.: Yes Daily analysis of laboratory values and/or radiology reports with any subsequent need for medication adjustment of medical intervention for : Neurological problems  Tanayia Wahlquist, Lemar Livings 02/18/2013, 8:53 AM

## 2013-02-17 NOTE — Progress Notes (Signed)
Physical Therapy Note  Patient Details  Name: [name] MRN: [id number] Date of Birth: [birth year] Today's Date: 02/17/2013  1605-1630 (25 minutes) individual Pain: no reported pain  Focus of treatment: Neuro re-ed RT LE Treatment: stand/turn transfer min assist with/without AD; Kinetron in sitting X 2 minutes; Kinetron in standing X 2 with assist to attain terminal knee extension in stance on right; up/down 4 inch step for quad control on right 2 X 15 (with max assist to place Rt foot on step secondary to decreased active hip flexion); sidelying AA RT hip flexion/extension 3 X 20 (gravity eliminated).    Reyes Aldaco,JIM 02/17/2013, 4:35 PM

## 2013-02-17 NOTE — Progress Notes (Signed)
Patient ID: [name], male   DOB: July 18, 1956, 57 y.o.   MRN: [id number] Subjective/Complaints: Denies pain, states bowels and bladder ok A 12 point review of systems has been performed and if not noted above is otherwise negative.   Objective: Vital Signs: Blood pressure 108/68, pulse 70, temperature 97.9 F (36.6 C), temperature source Oral, resp. rate 20, height 5\' 9"  (1.753 m), weight 83.4 kg (183 lb 13.8 oz), SpO2 100.00%. No results found. No results found for this basename: WBC, HGB, HCT, PLT,  in the last 72 hours  Recent Labs  02/16/13 0510  CREATININE 0.98   CBG (last 3)  No results found for this basename: GLUCAP,  in the last 72 hours  Wt Readings from Last 3 Encounters:  02/10/13 83.4 kg (183 lb 13.8 oz)  02/03/13 83.915 kg (185 lb)  02/03/13 83.915 kg (185 lb)    Physical Exam:  HENT:  Head: Normocephalic.  Eyes: EOM are normal.  Neck: Neck supple. No thyromegaly present.  Cardiovascular: Normal rate and regular rhythm.  Pulmonary/Chest: Effort normal and breath sounds normal. No respiratory distress.  Abdominal: Soft. Bowel sounds are normal. He exhibits no distension.  Neurological: He is alert.  Patient with word finding deficits, verbal apraxia. He was able to state his name  Identified multiple objects for me. He did follow simple commands. RUE is tr to 3+/5 and inconsistent. RLE is trace to 1 at HF and KE and 0/5 distally with inconsistency as well and likely apraxia. Decreased sensation to light touch but could grossly sense pain.  Skin: Skin is warm and dry.  Psychiatric:  Very flat, rarely makes eye contact.   Assessment/Plan: 1. 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 Physiatrist is providing close team supervision and 24 hour management of active medical problems listed below. Physiatrist and rehab team  continue to assess barriers to discharge/monitor patient progress toward functional and medical goals.  Care team today see report FIM: FIM - Bathing Bathing Steps Patient Completed: Chest;Right Arm;Abdomen;Front perineal area;Buttocks;Right upper leg;Left upper leg;Left lower leg (including foot) Bathing: 4: Min-Patient completes 8-9 77f 10 parts or 75+ percent  FIM - Upper Body Dressing/Undressing Upper body dressing/undressing steps patient completed: Thread/unthread right sleeve of pullover shirt/dresss;Thread/unthread left sleeve of pullover shirt/dress;Put head through opening of pull over shirt/dress;Pull shirt over trunk Upper body dressing/undressing: 5: Supervision: Safety issues/verbal cues FIM - Lower Body Dressing/Undressing Lower body dressing/undressing steps patient completed: Thread/unthread right underwear leg;Thread/unthread left underwear leg;Pull underwear up/down;Thread/unthread right pants leg;Thread/unthread left pants leg;Pull pants up/down;Don/Doff right sock;Don/Doff left sock;Don/Doff left shoe Lower body dressing/undressing: 3: Mod-Patient completed 50-74% of tasks  FIM - Toileting Toileting steps completed by patient: Adjust clothing prior to toileting;Adjust clothing after toileting Toileting Assistive Devices: Grab bar or rail for support Toileting: 3: Mod-Patient completed 2 of 3 steps  FIM - Diplomatic Services operational officer Devices: Grab bars Toilet Transfers: 2-To toilet/BSC: Max A (lift and lower assist);3-From toilet/BSC: Mod A (lift or lower assist)  FIM - Press photographer Assistive Devices: Arm rests Bed/Chair Transfer: 3: Chair or W/C > Bed: Mod A (lift or lower assist);3: Bed > Chair or W/C: Mod A (lift or lower assist)  FIM - Locomotion: Wheelchair Distance: 150 Locomotion: Wheelchair: 0: Activity did not occur FIM - Locomotion: Ambulation Locomotion: Ambulation Assistive Devices: Dealer Ambulation/Gait Assistance: 3: Mod assist;4: Min assist Locomotion: Ambulation: 3:  Travels 150 ft or more with moderate assistance (Pt: 50 - 74%)  Comprehension Comprehension Mode: Auditory Comprehension: 4-Understands basic 75 - 89% of the time/requires cueing 10 - 24% of the time  Expression Expression Mode: Verbal Expression: 3-Expresses basic 50 - 74% of the time/requires cueing 25 - 50% of the time. Needs to repeat parts of sentences.  Social Interaction Social Interaction: 4-Interacts appropriately 75 - 89% of the time - Needs redirection for appropriate language or to initiate interaction.  Problem Solving Problem Solving: 3-Solves basic 50 - 74% of the time/requires cueing 25 - 49% of the time  Memory Memory: 3-Recognizes or recalls 50 - 74% of the time/requires cueing 25 - 49% of the time  Medical Problem List and Plan:  1. 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 DVT Prophylaxis/Anticoagulation: Subcutaneous Lovenox. Monitor platelet counts any signs of bleeding  3. Neuropsych: This patient is not capable of making decisions on his/her own behalf.   -neuropsych assessment for mood 4. Hypertension. No present antihypertensive medication. Patient on hydrochlorothiazide 25 mg daily prior to admission. On low dose ACE to start/ don't want to lower pressure too much--continue with current regimen 5. Tobacco/marijuana abuse. Continue NicoDerm patch and provide counseling  6. Hyperlipidemia. Lipitor   LOS (Days) 8 A FACE TO FACE EVALUATION WAS PERFORMED  KIRSTEINS,ANDREW E 02/17/2013 7:08 AM

## 2013-02-17 NOTE — Progress Notes (Signed)
Nutrition Brief Note  Patient identified on the Malnutrition Screening Tool (MST) Report. Pt reports that his weight is stable and that he is eating well at this time.  Body mass index is 26.03 kg/(m^2). Patient meets criteria for Overweight based on current BMI.   Current diet order is Heart Healthy, patient is consuming approximately >75% of meals at this time. Labs and medications reviewed.   No nutrition interventions warranted at this time. If nutrition issues arise, please consult RD.   Jarold Motto MS, RD, LDN Pager: [phone] After-hours pager: [phone]

## 2013-02-17 NOTE — Progress Notes (Signed)
Social Work Patient ID: [name], male   DOB: 1956-03-29, 57 y.o.   MRN: [id number] Met with pt and spoke with wife via telephone to inform team conference goals-supervision/min level and discharge 6/17. Wife is pleased with the progress she has seen so far and is hopeful he will continue.  Will have her come in next week for family education. Continue to work on discharge plans.

## 2013-02-18 ENCOUNTER — Inpatient Hospital Stay (HOSPITAL_COMMUNITY): Payer: 59 | Admitting: Speech Pathology

## 2013-02-18 ENCOUNTER — Inpatient Hospital Stay (HOSPITAL_COMMUNITY): Payer: 59 | Admitting: Occupational Therapy

## 2013-02-18 ENCOUNTER — Inpatient Hospital Stay (HOSPITAL_COMMUNITY): Payer: 59 | Admitting: *Deleted

## 2013-02-18 NOTE — Progress Notes (Signed)
Occupational Therapy Session Note  Patient Details  Name: [name] MRN: [id number] Date of Birth: [birth year]  Today's Date: 02/18/2013 Time: 0730-0828 and 4098-1191 Time Calculation (min): 58 min and 40 min  Short Term Goals: Week 2:  OT Short Term Goal 1 (Week 2): Pt will complete toilet transfer with min assist stand pivot OT Short Term Goal 2 (Week 2): Pt will complete shower transfer with min assist OT Short Term Goal 3 (Week 2): Pt will complete LB dressing with min assist OT Short Term Goal 4 (Week 2): Pt will complete 2 grooming tasks in standing with min assist for balance  Skilled Therapeutic Interventions/Progress Updates:    1) Pt seen for ADL retraining with focus on transfers, hemi-technique with bathing and dressing, and use of RUE during functional tasks. Pt completed bathing at sit <> stand level in shower. Pt continues to require mod verbal cues and hand over hand assist to incorporate RUE in bathing tasks. Ambulated to walk-in shower with min-mod assist with RW, requiring max cues for transfer secondary to RLE placement. Pt with increased carryover of hemi-dressing technique and ability to cross RLE over knee and maintain it there with RUE while donning pants. Pt attempted to don Lt leg first, and when given time to problem solve undressed Lt leg and started with Rt leg. Engaged in gross motor NM re-ed with focus on automatic activity and release with RUE while tossing horse shoes.  Pt unable to release Rt grasp with this motion, however able to release horse shoe when reaching outside BOS for vertical target.  Engaged in weight shifting and rotation to increase body awareness and trunk control with self-care tasks.  Pt stood at washing machine with min assist to start a load of laundry, use of RUE to obtain laundry detergent pellet and push buttons to start machine.  2) Pt seen for 1:1 OT with focus on RUE use in functional tasks while engaging in therapeutic activity of  yahtzee.  Activity completed in standing with focus on standing balance without UE support; use of RUE with rolling dice, picking up dice, writing; and mental math with adding up score.  Pt with increased FMC and spontaneous use of RUE with picking up cup, rolling dice, and picking up dice with RUE. Pt with increased difficulty when picking up item with Lt hand and attempting to transfer to Rt hand, pt would attempt to incorporate his mouth during the transition.  Same would occur if he picked up the pencil with Lt hand.  Verbal cues for pt to pick pencil from table with Rt hand with improved consistency and appropriate grasp.  Pt completed mental math during turns with adding up dice, however required organization cues when adding complete score at end of activity.  Pt ambulated to laundry room with RW to obtain clean clothes, he carried bag and maneuvered RW with min assist.  Therapy Documentation Precautions:  Precautions Precautions: Fall Precaution Comments: Pt impulsive with poor awareness of deficits; Rt. inattention Restrictions Weight Bearing Restrictions: No General:   Vital Signs: Therapy Vitals Temp: 97.7 F (36.5 C) Temp src: Oral Pulse Rate: 68 Resp: 20 BP: 120/81 mmHg Patient Position, if appropriate: Lying Oxygen Therapy SpO2: 99 % O2 Device: None (Room air) Pain:  Pt with no c/o pain   See FIM for current functional status  Therapy/Group: Individual Therapy  Leonette Monarch 02/18/2013, 8:41 AM

## 2013-02-18 NOTE — Progress Notes (Addendum)
Physical Therapy Session Note  Patient Details  Name: [name] MRN: [id number] Date of Birth: [birth year]  Today's Date: 02/18/2013 Time: 14:20-15:18 ( )   Skilled Therapeutic Interventions/Progress Updates:  Tx focused on gait training and NMR for RLE.  Supine>sit with S only. Gait with RW in controlled environment with Min A 1x180' and multiple short walks with cues for R LE placement (not kick RW) and to decrease pace for safety. Pt unable to slow gait speed in busy environment, and difficulty adjusting strategy for turning/backing up with verbal cues. Attempted gait without device, only HHA and arm around waist, but after 3 steps, RLE ceased stepping, so RW was brought back to pt, and steps continued with Min A. Pre-gait training in // bars with Min A and manual facilitation for forward/backward stepping with LLE x20, then RLE x8, but then RLE ceased stepping. Also attempted mini-squats, but pt had difficulty with R knee flexion in stance. Attempted squat-walking in hall on rail, but pt became very anxious and just started saying "No, no, no." When I demonstrated and asked him to repeat. Redirected to another task.   NMR performed with Kinetron x2 min sitting and x46min standing to break extensor tone in standing, but pt had difficulty bending R knee reciprocally.    Stair training x10 with bil rails and up to Mod A due to pt attempting to proceed stepping with L before R was safely placed. Pt unable to follow directions for sequence with descent (L first), and needed cues for RUE placement.  Pt very fatigued at end of tx, requesting to rest in bed.        Therapy Documentation Precautions:  Precautions Precautions: Fall Precaution Comments: Pt impulsive with poor awareness of deficits; Rt. inattention Restrictions Weight Bearing Restrictions: No    Pain: Pain Assessment Pain Assessment: No/denies pain    See FIM for current functional status  Therapy/Group: Individual  Therapy  Clydene Laming, PT, DPT  02/18/2013, 2:34 PM

## 2013-02-18 NOTE — Progress Notes (Signed)
Patient ID: [name], male   DOB: [birth year], 57 y.o.   MRN: [id number] Subjective/Complaints: No bowel or bladder issues, no pain A 12 point review of systems has been performed and if not noted above is otherwise negative.   Objective: Vital Signs: Blood pressure 120/81, pulse 68, temperature 97.7 F (36.5 C), temperature source Oral, resp. rate 20, height 5\' 9"  (1.753 m), weight 80 kg (176 lb 5.9 oz), SpO2 99.00%. No results found. No results found for this basename: WBC, HGB, HCT, PLT,  in the last 72 hours  Recent Labs  02/16/13 0510  CREATININE 0.98   CBG (last 3)  No results found for this basename: GLUCAP,  in the last 72 hours  Wt Readings from Last 3 Encounters:  02/17/13 80 kg (176 lb 5.9 oz)  02/03/13 83.915 kg (185 lb)  02/03/13 83.915 kg (185 lb)    Physical Exam:  HENT:  Head: Normocephalic.  Eyes: EOM are normal.  Neck: Neck supple. No thyromegaly present.  Cardiovascular: Normal rate and regular rhythm.  Pulmonary/Chest: Effort normal and breath sounds normal. No respiratory distress.  Abdominal: Soft. Bowel sounds are normal. He exhibits no distension.  Neurological: He is alert.  Patient with word finding deficits, verbal apraxia. He was able to state his name  Identified multiple objects for me. He did follow simple commands. RUE is tr to 3+/5 and inconsistent. RLE is trace to 1 at HF and KE and 0/5 distally with inconsistency as well and likely apraxia. Decreased sensation to light touch but could grossly sense pain.  Skin: Skin is warm and dry.  Psychiatric:  Very flat, rarely makes eye contact.   Assessment/Plan: 1. 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 Physiatrist is providing close team supervision and 24 hour management of active medical problems listed below. Physiatrist and rehab team continue to assess  barriers to discharge/monitor patient progress toward functional and medical goals.  Care team today see report FIM: FIM - Bathing Bathing Steps Patient Completed: Chest;Right Arm;Abdomen;Front perineal area;Buttocks;Right upper leg;Left upper leg;Left lower leg (including foot) Bathing: 4: Min-Patient completes 8-9 74f 10 parts or 75+ percent  FIM - Upper Body Dressing/Undressing Upper body dressing/undressing steps patient completed: Thread/unthread right sleeve of pullover shirt/dresss;Thread/unthread left sleeve of pullover shirt/dress;Put head through opening of pull over shirt/dress;Pull shirt over trunk Upper body dressing/undressing: 5: Supervision: Safety issues/verbal cues FIM - Lower Body Dressing/Undressing Lower body dressing/undressing steps patient completed: Thread/unthread right underwear leg;Thread/unthread left underwear leg;Pull underwear up/down;Thread/unthread right pants leg;Thread/unthread left pants leg;Pull pants up/down;Don/Doff right sock;Don/Doff left sock;Don/Doff left shoe;Don/Doff right shoe Lower body dressing/undressing: 3: Mod-Patient completed 50-74% of tasks  FIM - Toileting Toileting steps completed by patient: Adjust clothing prior to toileting;Adjust clothing after toileting Toileting Assistive Devices: Grab bar or rail for support Toileting: 3: Mod-Patient completed 2 of 3 steps  FIM - Diplomatic Services operational officer Devices: Grab bars Toilet Transfers: 2-To toilet/BSC: Max A (lift and lower assist);3-From toilet/BSC: Mod A (lift or lower assist)  FIM - Bed/Chair Transfer Bed/Chair Transfer Assistive Devices: Arm rests Bed/Chair Transfer: 4: Bed > Chair or W/C: Min A (steadying Pt. > 75%);5: Supine > Sit: Supervision (verbal cues/safety issues);4: Chair or W/C > Bed: Min A (steadying Pt. > 75%)  FIM - Locomotion: Wheelchair Distance: 150 Locomotion: Wheelchair: 0: Activity did not occur FIM - Locomotion: Ambulation Locomotion:  Ambulation Assistive Devices: Designer, industrial/product Ambulation/Gait  Assistance: 4: Min assist Locomotion: Ambulation: 4: Travels 150 ft or more with minimal assistance (Pt.>75%)  Comprehension Comprehension Mode: Auditory Comprehension: 4-Understands basic 75 - 89% of the time/requires cueing 10 - 24% of the time  Expression Expression Mode: Verbal Expression: 3-Expresses basic 50 - 74% of the time/requires cueing 25 - 50% of the time. Needs to repeat parts of sentences.  Social Interaction Social Interaction: 5-Interacts appropriately 90% of the time - Needs monitoring or encouragement for participation or interaction.  Problem Solving Problem Solving: 3-Solves basic 50 - 74% of the time/requires cueing 25 - 49% of the time  Memory Memory: 3-Recognizes or recalls 50 - 74% of the time/requires cueing 25 - 49% of the time  Medical Problem List and Plan:  1. 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 DVT Prophylaxis/Anticoagulation: Subcutaneous Lovenox. Monitor platelet counts any signs of bleeding  3. Neuropsych: This patient is not capable of making decisions on his/her own behalf.   -neuropsych assessment for mood 4. Hypertension. No present antihypertensive medication. Patient on hydrochlorothiazide 25 mg daily prior to admission. On low dose ACE to start/ don't want to lower pressure too much--continue with current regimen 5. Tobacco/marijuana abuse. Continue NicoDerm patch and provide counseling  6. Hyperlipidemia. Lipitor   LOS (Days) 9 A FACE TO FACE EVALUATION WAS PERFORMED  Erick Colace 02/18/2013 7:24 AM

## 2013-02-18 NOTE — Progress Notes (Signed)
Speech Language Pathology Daily Session Note  Patient Details  Name: [name] MRN: [id number] Date of Birth: 1956/08/13  Today's Date: 02/18/2013 Time: 0865-7846 Time Calculation (min): 45 min  Short Term Goals: Week 2: SLP Short Term Goal 1 (Week 2): Pt will utilize call bell to request assistance with Min level verbal and question cues.  SLP Short Term Goal 2 (Week 2): Pt will utilize external memory aids to increase recall/carryover of newly learned informaiton with Min A verbal and question cues.  SLP Short Term Goal 3 (Week 2): Pt will demonstrate functional problem solving with basic and familair tasks with Supervisoin level verbal and question cues.  SLP Short Term Goal 4 (Week 2): Pt will self-monitor and correct verbal errors at the sentence level with Min A verbal cues. SLP Short Term Goal 5 (Week 2): Pt will utilize word-finding strategies with Min A verbal and question cues.   Skilled Therapeutic Interventions: Skilled treatment session focused on cognitive-linguistic goals.  SLP facilitated session with Min assist verbal and visual cues to safely transfer from bed to chair due to decreased safety awareness and impulsivity.  Patient also required Min assist verbal cues to attend to right upper extremity throughout session.  Patient participated in a descriptive task by producing phrase level verbal descriptions with Min assist question cues to self-monitor and correct language errors.  Speech continues to be characterized by dysfluencies at the initial phoneme, word and phrase level.  Patient also participated in a writing task and required Mod assist question cues to self-monitor and correct spelling errors.       FIM:  Comprehension Comprehension: 4-Understands basic 75 - 89% of the time/requires cueing 10 - 24% of the time Expression Expression: 3-Expresses basic 50 - 74% of the time/requires cueing 25 - 50% of the time. Needs to repeat parts of sentences. Social  Interaction Social Interaction: 5-Interacts appropriately 90% of the time - Needs monitoring or encouragement for participation or interaction. Problem Solving Problem Solving: 3-Solves basic 50 - 74% of the time/requires cueing 25 - 49% of the time Memory Memory: 3-Recognizes or recalls 50 - 74% of the time/requires cueing 25 - 49% of the time  Pain Pain Assessment Pain Assessment: No/denies pain  Therapy/Group: Individual Therapy  Charlane Ferretti., CCC-SLP [phone]  [name] 02/18/2013, 1:25 PM

## 2013-02-19 ENCOUNTER — Inpatient Hospital Stay (HOSPITAL_COMMUNITY): Payer: 59 | Admitting: Speech Pathology

## 2013-02-19 ENCOUNTER — Inpatient Hospital Stay (HOSPITAL_COMMUNITY): Payer: 59 | Admitting: Physical Therapy

## 2013-02-19 ENCOUNTER — Inpatient Hospital Stay (HOSPITAL_COMMUNITY): Payer: 59 | Admitting: *Deleted

## 2013-02-19 ENCOUNTER — Inpatient Hospital Stay (HOSPITAL_COMMUNITY): Payer: 59 | Admitting: Occupational Therapy

## 2013-02-19 NOTE — Progress Notes (Signed)
Patient ID: [name], male   DOB: 14-Jan-1956, 57 y.o.   MRN: [id number] Subjective/Complaints: No bowel or bladder issues, no pain A 12 point review of systems has been performed and if not noted above is otherwise negative.   Objective: Vital Signs: Blood pressure 127/79, pulse 74, temperature 98 F (36.7 C), temperature source Oral, resp. rate 19, height 5\' 9"  (1.753 m), weight 80 kg (176 lb 5.9 oz), SpO2 99.00%. No results found. No results found for this basename: WBC, HGB, HCT, PLT,  in the last 72 hours No results found for this basename: NA, K, CL, CO, GLUCOSE, BUN, CREATININE, CALCIUM,  in the last 72 hours CBG (last 3)  No results found for this basename: GLUCAP,  in the last 72 hours  Wt Readings from Last 3 Encounters:  02/17/13 80 kg (176 lb 5.9 oz)  02/03/13 83.915 kg (185 lb)  02/03/13 83.915 kg (185 lb)    Physical Exam:  HENT:  Head: Normocephalic.  Eyes: EOM are normal.  Neck: Neck supple. No thyromegaly present.  Cardiovascular: Normal rate and regular rhythm.  Pulmonary/Chest: Effort normal and breath sounds normal. No respiratory distress.  Abdominal: Soft. Bowel sounds are normal. He exhibits no distension.  Neurological: He is alert.  Patient with word finding deficits, verbal apraxia. He was able to state his name  Identified multiple objects for me. He did follow simple commands. RUE is tr to 3+/5 and inconsistent. RLE is trace to 1 at HF and KE and 0/5 distally with inconsistency as well and likely apraxia. Decreased sensation to light touch but could grossly sense pain.  Skin: Skin is warm and dry.  Psychiatric:  Very flat, rarely makes eye contact.   Assessment/Plan: 1. 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 Physiatrist is providing close team supervision and 24 hour management of active medical problems listed  below. Physiatrist and rehab team continue to assess barriers to discharge/monitor patient progress toward functional and medical goals.  FIM: FIM - Bathing Bathing Steps Patient Completed: Chest;Right Arm;Abdomen;Front perineal area;Buttocks;Right upper leg;Left upper leg;Left lower leg (including foot) Bathing: 4: Min-Patient completes 8-9 66f 10 parts or 75+ percent  FIM - Upper Body Dressing/Undressing Upper body dressing/undressing steps patient completed: Thread/unthread right sleeve of pullover shirt/dresss;Thread/unthread left sleeve of pullover shirt/dress;Put head through opening of pull over shirt/dress;Pull shirt over trunk Upper body dressing/undressing: 5: Supervision: Safety issues/verbal cues FIM - Lower Body Dressing/Undressing Lower body dressing/undressing steps patient completed: Thread/unthread right pants leg;Thread/unthread left pants leg;Pull pants up/down;Don/Doff right shoe;Don/Doff left shoe;Fasten/unfasten right shoe;Fasten/unfasten left shoe;Thread/unthread right underwear leg;Thread/unthread left underwear leg;Pull underwear up/down;Don/Doff right sock;Don/Doff left sock Lower body dressing/undressing: 4: Steadying Assist  FIM - Toileting Toileting steps completed by patient: Adjust clothing prior to toileting;Adjust clothing after toileting Toileting Assistive Devices: Grab bar or rail for support Toileting: 3: Mod-Patient completed 2 of 3 steps  FIM - Diplomatic Services operational officer Devices: Grab bars Toilet Transfers: 2-To toilet/BSC: Max A (lift and lower assist);3-From toilet/BSC: Mod A (lift or lower assist)  FIM - Bed/Chair Transfer Bed/Chair Transfer Assistive Devices: Arm rests;Walker Bed/Chair Transfer: 5: Supine > Sit: Supervision (verbal cues/safety issues);5: Sit > Supine: Supervision (verbal cues/safety issues);4: Bed > Chair or W/C: Min A (steadying Pt. > 75%);4: Chair or W/C > Bed: Min A (steadying Pt. > 75%)  FIM - Locomotion:  Wheelchair Distance: 150 Locomotion: Wheelchair: 0: Activity did not occur  FIM - Locomotion: Ambulation Locomotion: Ambulation Assistive Devices: Designer, industrial/product Ambulation/Gait Assistance: 4: Min assist Locomotion: Ambulation: 4: Travels 150 ft or more with minimal assistance (Pt.>75%)  Comprehension Comprehension Mode: Auditory Comprehension: 4-Understands basic 75 - 89% of the time/requires cueing 10 - 24% of the time  Expression Expression Mode: Verbal Expression: 3-Expresses basic 50 - 74% of the time/requires cueing 25 - 50% of the time. Needs to repeat parts of sentences.  Social Interaction Social Interaction: 5-Interacts appropriately 90% of the time - Needs monitoring or encouragement for participation or interaction.  Problem Solving Problem Solving: 3-Solves basic 50 - 74% of the time/requires cueing 25 - 49% of the time  Memory Memory: 3-Recognizes or recalls 50 - 74% of the time/requires cueing 25 - 49% of the time  Medical Problem List and Plan:  1. 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 DVT Prophylaxis/Anticoagulation: Subcutaneous Lovenox. Monitor platelet counts any signs of bleeding  3. Neuropsych: This patient is not capable of making decisions on his/her own behalf.   -neuropsych assessment for mood 4. Hypertension. . Patient on hydrochlorothiazide 25 mg daily prior to admission. On low dose ACE to start/ don't want to lower pressure too much--continue with current regimen 5. Tobacco/marijuana abuse. Continue NicoDerm patch and provide counseling  6. Hyperlipidemia. Lipitor   LOS (Days) 10 A FACE TO FACE EVALUATION WAS PERFORMED  Erick Colace 02/19/2013 8:18 AM

## 2013-02-19 NOTE — Progress Notes (Signed)
Speech Language Pathology Daily Session Note  Patient Details  Name: [name] MRN: [id number] Date of Birth: 1956/02/11  Today's Date: 02/19/2013 Time: 1000-1030 Time Calculation (min): 30 min  Short Term Goals: Week 2: SLP Short Term Goal 1 (Week 2): Pt will utilize call bell to request assistance with Min level verbal and question cues.  SLP Short Term Goal 2 (Week 2): Pt will utilize external memory aids to increase recall/carryover of newly learned informaiton with Min A verbal and question cues.  SLP Short Term Goal 3 (Week 2): Pt will demonstrate functional problem solving with basic and familair tasks with Supervisoin level verbal and question cues.  SLP Short Term Goal 4 (Week 2): Pt will self-monitor and correct verbal errors at the sentence level with Min A verbal cues. SLP Short Term Goal 5 (Week 2): Pt will utilize word-finding strategies with Min A verbal and question cues.   Skilled Therapeutic Interventions: Skilled treatment session focused on cognitive-linguistic goals.  SLP facilitated session with familiar card game (patient requested) and Supervision level verbal cues to follow procedures of the game, as well as Min assist verbal cues to utilize easy onset during the structured task.  Of Note, patient requires Max assist at the conversational level.  Patient reported to SLP that he has the same thing for breakfast every day; SLP provided patient with menu, paper and pen and increased wait time to utilize menu to assist with self-checking spelling.     FIM:  Comprehension Comprehension: 4-Understands basic 75 - 89% of the time/requires cueing 10 - 24% of the time Expression Expression: 3-Expresses basic 50 - 74% of the time/requires cueing 25 - 50% of the time. Needs to repeat parts of sentences. Social Interaction Social Interaction: 5-Interacts appropriately 90% of the time - Needs monitoring or encouragement for participation or interaction. Problem Solving Problem  Solving: 3-Solves basic 50 - 74% of the time/requires cueing 25 - 49% of the time Memory Memory: 3-Recognizes or recalls 50 - 74% of the time/requires cueing 25 - 49% of the time  Pain Pain Assessment Pain Assessment: No/denies pain  Therapy/Group: Individual Therapy  Charlane Ferretti., CCC-SLP [phone]  [name] 02/19/2013, 11:30 AM

## 2013-02-19 NOTE — Progress Notes (Signed)
Physical Therapy Session Note  Patient Details  Name: [name] MRN: [id number] Date of Birth: May 14, 1956  Today's Date: 02/19/2013 Time: 1324-4010 Time Calculation (min): 43 min  Short Term Goals: Week 2:  PT Short Term Goal 1 (Week 2): Patient will perform bed <> w/c transfers with consistent min A and improved RLE advancement and safe placement during pivoting PT Short Term Goal 2 (Week 2): Patient will perform w/c mobility x 150' in controlled environment with min A and improved coordination of LE propulsion and attention to R side PT Short Term Goal 3 (Week 2): Patient will perform gait in controlled environment with RW x 150' with min A and improved attention to R and improved ability to advance RLE during changes in direction PT Short Term Goal 4 (Week 2): Patient will perform stair negotiation up and down 4 stairs with 2 rails and min A with improved activation and advancement of RLE with 50% cues  Skilled Therapeutic Interventions/Progress Updates:    Patient received supine in bed. Session focused on gait training and isolation of R knee flexion. Patient instructed in gait training room<>gym (>150') with RW and supervision-intermittent min guard secondary to L lateral trunk lean. Various methods attempted to isolate R knee flexion/hamstring activation (in prone maintaining knee flexion, tapping R LE on step, stepping over bolster with R LE, and in supine with R LE off edge of mat bringing R LE onto mat). With all of these activities, patient unable to demonstrate active R knee flexion or hamstring activation, however demonstrates good compensations.  Passive prolonged stretching of R hip adductors and hip extensors. Patient performed x10 squats to facilitate LE flexion, but patient demonstrates compensations with hip and trunk flexion. Patient performed backwards walking x30' with L handrail and min assist and gait training without AD with min assist x87'. Patient demonstrates  difficulty with R LE extension. Patient returned to room and left supine in bed with bed alarm on and all needs within reach.   Therapy Documentation Precautions:  Precautions Precautions: Fall Precaution Comments: Pt impulsive with poor awareness of deficits; Rt. inattention Restrictions Weight Bearing Restrictions: No Pain: Pain Assessment Pain Assessment: No/denies pain Pain Score: 0-No pain Locomotion : Ambulation Ambulation/Gait Assistance: 4: Min guard   See FIM for current functional status  Therapy/Group: Individual Therapy  Chipper Herb. Trayquan Kolakowski, PT, DPT  02/19/2013, 2:31 PM

## 2013-02-19 NOTE — Progress Notes (Signed)
Physical Therapy Session Note  Patient Details  Name: [name] MRN: [id number] Date of Birth: Mar 16, 1956  Today's Date: 02/19/2013 Time: 0907-1000 Time Calculation (min): 53 min  Short Term Goals: Week 2:  PT Short Term Goal 1 (Week 2): Patient will perform bed <> w/c transfers with consistent min A and improved RLE advancement and safe placement during pivoting PT Short Term Goal 2 (Week 2): Patient will perform w/c mobility x 150' in controlled environment with min A and improved coordination of LE propulsion and attention to R side PT Short Term Goal 3 (Week 2): Patient will perform gait in controlled environment with RW x 150' with min A and improved attention to R and improved ability to advance RLE during changes in direction PT Short Term Goal 4 (Week 2): Patient will perform stair negotiation up and down 4 stairs with 2 rails and min A with improved activation and advancement of RLE with 50% cues  Skilled Therapeutic Interventions/Progress Updates:   Performed sit > stand from w/c with patient verbalizing and demonstrating safe w/c parts management and use of RUE to push from w/c.  Performed gait in controlled environment x 150' with RW and supervision with improved distance to RW and safety with RW during straight navigation and changes in direction. Performed NMR for weight shifting and sustained activation of RLE and to minimize extensor tone on Kinetron at 10-20 cm/sec; see below for details.  Performed gait sequence training standing beside mat on LLE with bilat UE support on RW while performing RLE hip and knee flexion forward and retro advancement to bring R knee up on mat and performing trunk elongation and anterior pelvic rotation on R with tactile cues to minimize lumbar extension or trunk flexion for compensation. Pt demonstrating improved activation of R hip and knee flexion; required one sitting rest break.  Gait training without RW x 50' with min-mod A for balance while  stepping over low > medium > high obstacles on floor for visual cue for increased RLE hip and knee flexion during swing phase.  Still requires the initiation of LLE in order to fully clear RLE.  Returned to room without RW x 150' with min A with consistent RLE foot clearance even during changes in direction.  Will keep RW in room for toileting with nursing but will begin to train without in therapy for increased trunk control training.  Therapy Documentation Precautions:  Precautions Precautions: Fall Precaution Comments: Pt impulsive with poor awareness of deficits; Rt. inattention Restrictions Weight Bearing Restrictions: No Vital Signs: Therapy Vitals Temp: 98 F (36.7 C) Temp src: Oral Pulse Rate: 74 Resp: 18 BP: 127/85 mmHg Patient Position, if appropriate: Sitting Oxygen Therapy SpO2: 99 % O2 Device: None (Room air) Pain: Pain Assessment Pain Assessment: No/denies pain Other Treatments: Treatments Neuromuscular Facilitation: Right;Upper Extremity;Lower Extremity;Activity to increase coordination;Activity to increase motor control;Activity to increase timing and sequencing;Activity to increase sustained activation;Activity to increase lateral weight shifting;Activity to increase anterior-posterior weight shifting;Forced use on Kinetron beginning in sitting with alternating LE extensions x 2-3 minutes with bilat UE support for sitting balance progressing to standing alternating LE extensions x 2 minutes x 2 reps with manual facilitation to maintain upright trunk, RLE hip IR and anterior pelvic rotation; also performed static standing with rotation to L with reaching with RUE up and to the L to facilitate R anterior pelvic rotation while maintaining RLE extension; performed sit <> stand from Kinetron seat with UE >> no UE support with focus on maintaining trunk  and COG in midline and activation of RLE and minimizing extensor tone in RLE with min A overall   See FIM for current  functional status  Therapy/Group: Individual Therapy  Edman Circle [facility] 02/19/2013, 10:07 AM

## 2013-02-19 NOTE — Progress Notes (Signed)
Occupational Therapy Session Note  Patient Details  Name: [name] MRN: [id number] Date of Birth: Jun 04, 1956  Today's Date: 02/19/2013 Time: 4132-4401 Time Calculation (min): 55 min  Short Term Goals: Week 2:  OT Short Term Goal 1 (Week 2): Pt will complete toilet transfer with min assist stand pivot OT Short Term Goal 2 (Week 2): Pt will complete shower transfer with min assist OT Short Term Goal 3 (Week 2): Pt will complete LB dressing with min assist OT Short Term Goal 4 (Week 2): Pt will complete 2 grooming tasks in standing with min assist for balance  Skilled Therapeutic Interventions/Progress Updates:    Pt seen for ADL retraining with focus on sit <> stand, transfers, hemi-technique with bathing and dressing, and functional use of RUE.  Pt finishing breakfast upon arrival.  Engaged in bathing and dressing at sit<> stand level at sink.  Verbal cues provided for safe placement of RLE prior to sit to stand, with pt requiring physical assistance to obtain appropriate positioning.  Verbal cues for use of RUE with opening toothpaste as pt continues to use mouth to compensate.  Pt able to tie shoes this session with increased time and concentration.  Engaged in NM re-ed in unsupported sitting with focus on transition of item from Rt hand to Lt hand as pt tends to pry object out of hand when going from Rt to Lt.  Engaged in ball toss from hand to hand with pt with decreased ability to toss but with successful hand exchange.  Progressed to rolling ball, again with focus on automatic release of objects.  Pt with success with rolling ball, however when challenged to toss horse shoe 2 feet in front, he was unable to release horse shoe.  Removed target with increased success however requiring increased forward lean and nearly touching surface before releasing object.  Pt ambulated back to room with min assist and min cues for safer gait speed.  Therapy Documentation Precautions:   Precautions Precautions: Fall Precaution Comments: Pt impulsive with poor awareness of deficits; Rt. inattention Restrictions Weight Bearing Restrictions: No General:   Vital Signs: Therapy Vitals Temp: 98 F (36.7 C) Temp src: Oral Pulse Rate: 74 Resp: 19 BP: 127/79 mmHg Patient Position, if appropriate: Lying Oxygen Therapy SpO2: 99 % O2 Device: None (Room air) Pain:  Pt with no c/o pain this session.  See FIM for current functional status  Therapy/Group: Individual Therapy  Leonette Monarch 02/19/2013, 8:38 AM

## 2013-02-20 ENCOUNTER — Inpatient Hospital Stay (HOSPITAL_COMMUNITY): Payer: 59 | Admitting: Physical Therapy

## 2013-02-20 NOTE — Progress Notes (Signed)
Patient ID: [name], male   DOB: [birth year], 57 y.o.   MRN: [id number] Subjective/Complaints: No bowel or bladder issues, no pain A 12 point review of systems has been performed and if not noted above is otherwise negative.   Objective: Vital Signs: Blood pressure 126/77, pulse 73, temperature 97.8 F (36.6 C), temperature source Oral, resp. rate 18, height 5\' 9"  (1.753 m), weight 80 kg (176 lb 5.9 oz), SpO2 100.00%. No results found. No results found for this basename: WBC, HGB, HCT, PLT,  in the last 72 hours No results found for this basename: NA, K, CL, CO, GLUCOSE, BUN, CREATININE, CALCIUM,  in the last 72 hours CBG (last 3)  No results found for this basename: GLUCAP,  in the last 72 hours  Wt Readings from Last 3 Encounters:  02/17/13 80 kg (176 lb 5.9 oz)  02/03/13 83.915 kg (185 lb)  02/03/13 83.915 kg (185 lb)    Physical Exam:  HENT:  Head: Normocephalic.  Eyes: EOM are normal.  Neck: Neck supple. No thyromegaly present.  Cardiovascular: Normal rate and regular rhythm.  Pulmonary/Chest: Effort normal and breath sounds normal. No respiratory distress.  Abdominal: Soft. Bowel sounds are normal. He exhibits no distension.  Neurological: He is alert.  Patient with word finding deficits, verbal apraxia. He was able to state his name   He did follow simple commands. RUE is tr to 3+/5 and inconsistent. RLE is trace to 1 at HF and KE and 0/5 distally with inconsistency as well and likely apraxia. Decreased sensation to light touch but could grossly sense pain. Persistent grasp right hand Skin: Skin is warm and dry.  Psychiatric:   affect brighter   Assessment/Plan: 1. 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 Physiatrist is providing close team supervision and 24 hour management of active medical problems listed below. Physiatrist and  rehab team continue to assess barriers to discharge/monitor patient progress toward functional and medical goals.  FIM: FIM - Bathing Bathing Steps Patient Completed: Chest;Right Arm;Abdomen;Front perineal area;Buttocks;Right upper leg;Left upper leg;Left lower leg (including foot) Bathing: 4: Min-Patient completes 8-9 64f 10 parts or 75+ percent  FIM - Upper Body Dressing/Undressing Upper body dressing/undressing steps patient completed: Thread/unthread right sleeve of pullover shirt/dresss;Thread/unthread left sleeve of pullover shirt/dress;Put head through opening of pull over shirt/dress;Pull shirt over trunk Upper body dressing/undressing: 5: Supervision: Safety issues/verbal cues FIM - Lower Body Dressing/Undressing Lower body dressing/undressing steps patient completed: Thread/unthread right pants leg;Thread/unthread left pants leg;Pull pants up/down;Don/Doff right shoe;Don/Doff left shoe;Fasten/unfasten right shoe;Fasten/unfasten left shoe;Thread/unthread right underwear leg;Thread/unthread left underwear leg;Pull underwear up/down;Don/Doff right sock;Don/Doff left sock Lower body dressing/undressing: 4: Steadying Assist  FIM - Toileting Toileting steps completed by patient: Adjust clothing prior to toileting;Adjust clothing after toileting Toileting Assistive Devices: Grab bar or rail for support Toileting: 3: Mod-Patient completed 2 of 3 steps  FIM - Diplomatic Services operational officer Devices: Grab bars Toilet Transfers: 2-To toilet/BSC: Max A (lift and lower assist);3-From toilet/BSC: Mod A (lift or lower assist)  FIM - Bed/Chair Transfer Bed/Chair Transfer Assistive Devices: Arm rests;Walker Bed/Chair Transfer: 5: Chair or W/C > Bed: Supervision (verbal cues/safety issues);5: Bed > Chair or W/C: Supervision (verbal cues/safety issues);5: Supine > Sit: Supervision (verbal cues/safety issues);5: Sit > Supine: Supervision (verbal cues/safety issues)  FIM - Locomotion:  Wheelchair Distance: 150 Locomotion: Wheelchair: 0: Activity did not occur FIM - Locomotion: Ambulation Locomotion: Ambulation Assistive Devices:  Walker - Rolling Ambulation/Gait Assistance: 4: Min guard Locomotion: Ambulation: 4: Travels 150 ft or more with minimal assistance (Pt.>75%)  Comprehension Comprehension Mode: Auditory Comprehension: 4-Understands basic 75 - 89% of the time/requires cueing 10 - 24% of the time  Expression Expression Mode: Verbal Expression: 3-Expresses basic 50 - 74% of the time/requires cueing 25 - 50% of the time. Needs to repeat parts of sentences.  Social Interaction Social Interaction: 5-Interacts appropriately 90% of the time - Needs monitoring or encouragement for participation or interaction.  Problem Solving Problem Solving: 3-Solves basic 50 - 74% of the time/requires cueing 25 - 49% of the time  Memory Memory: 3-Recognizes or recalls 50 - 74% of the time/requires cueing 25 - 49% of the time  Medical Problem List and Plan:  1. 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 DVT Prophylaxis/Anticoagulation: Subcutaneous Lovenox. Monitor platelet counts any signs of bleeding  3. Neuropsych: This patient is not capable of making decisions on his/her own behalf.   -neuropsych assessment for mood 4. Hypertension. . Patient on hydrochlorothiazide 25 mg daily prior to admission. On low dose ACE to start/ don't want to lower pressure too much--continue with current regimen 5. Tobacco/marijuana abuse. Continue NicoDerm patch and provide counseling  6. Hyperlipidemia. Lipitor   LOS (Days) 11 A FACE TO FACE EVALUATION WAS PERFORMED  Erick Colace 02/20/2013 8:55 AM

## 2013-02-20 NOTE — Progress Notes (Signed)
Physical Therapy Note  Patient Details  Name: [name] MRN: [id number] Date of Birth: 11/10/1955 Today's Date: 02/20/2013  1300-1355 (55 minutes) group Pain: no reported pain Pt participated in PT group session focused on gait training /safety/endurance; Pt ambulates using RW min to close SBA with vcs for safety (decrease gait speed/ stay within AD) 80 feet. Pt also performed gravity eliminated RT hip flexion / abduction using suspension grid to improve RT LE control.    Garcia Dalzell,JIM 02/20/2013, 2:04 PM

## 2013-02-21 ENCOUNTER — Inpatient Hospital Stay (HOSPITAL_COMMUNITY): Payer: 59 | Admitting: Physical Therapy

## 2013-02-21 MED ORDER — SENNOSIDES-DOCUSATE SODIUM 8.6-50 MG PO TABS
2.0000 | ORAL_TABLET | Freq: Two times a day (BID) | ORAL | Status: DC
  Administered 2013-02-21 – 2013-02-25 (×8): 2 via ORAL
  Filled 2013-02-21 (×5): qty 2

## 2013-02-21 NOTE — Progress Notes (Signed)
Patient ID: [name], male   DOB: 13-Aug-1956, 57 y.o.   MRN: [id number] Subjective/Complaints: No bowel or bladder issues, no pain A 12 point review of systems has been performed and if not noted above is otherwise negative.   Objective: Vital Signs: Blood pressure 134/84, pulse 72, temperature 98.2 F (36.8 C), temperature source Oral, resp. rate 16, height 5\' 9"  (1.753 m), weight 80 kg (176 lb 5.9 oz), SpO2 100.00%. No results found. No results found for this basename: WBC, HGB, HCT, PLT,  in the last 72 hours No results found for this basename: NA, K, CL, CO, GLUCOSE, BUN, CREATININE, CALCIUM,  in the last 72 hours CBG (last 3)  No results found for this basename: GLUCAP,  in the last 72 hours  Wt Readings from Last 3 Encounters:  02/17/13 80 kg (176 lb 5.9 oz)  02/03/13 83.915 kg (185 lb)  02/03/13 83.915 kg (185 lb)    Physical Exam:  HENT:  Head: Normocephalic.  Eyes: EOM are normal.  Neck: Neck supple. No thyromegaly present.  Cardiovascular: Normal rate and regular rhythm.  Pulmonary/Chest: Effort normal and breath sounds normal. No respiratory distress.  Abdominal: Soft. Bowel sounds are normal. He exhibits no distension.  Neurological: He is alert.  Patient with word finding deficits, verbal apraxia. He was able to state his name   He did follow simple commands. RUE is tr to 3+/5 and inconsistent. RLE is trace to 1 at HF and KE and 0/5 distally with inconsistency as well and likely apraxia. Decreased sensation to light touch but could grossly sense pain. Persistent grasp right hand Skin: Skin is warm and dry.  Psychiatric:   affect brighter   Assessment/Plan: 1. 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 Physiatrist is providing close team supervision and 24 hour management of active medical problems listed below. Physiatrist and  rehab team continue to assess barriers to discharge/monitor patient progress toward functional and medical goals.  FIM: FIM - Bathing Bathing Steps Patient Completed: Chest;Right Arm;Abdomen;Front perineal area;Buttocks;Right upper leg;Left upper leg;Left lower leg (including foot) Bathing: 4: Min-Patient completes 8-9 24f 10 parts or 75+ percent  FIM - Upper Body Dressing/Undressing Upper body dressing/undressing steps patient completed: Thread/unthread right sleeve of pullover shirt/dresss;Thread/unthread left sleeve of pullover shirt/dress;Put head through opening of pull over shirt/dress;Pull shirt over trunk Upper body dressing/undressing: 5: Supervision: Safety issues/verbal cues FIM - Lower Body Dressing/Undressing Lower body dressing/undressing steps patient completed: Thread/unthread right pants leg;Thread/unthread left pants leg;Pull pants up/down;Don/Doff right shoe;Don/Doff left shoe;Fasten/unfasten right shoe;Fasten/unfasten left shoe;Thread/unthread right underwear leg;Thread/unthread left underwear leg;Pull underwear up/down;Don/Doff right sock;Don/Doff left sock Lower body dressing/undressing: 4: Steadying Assist  FIM - Toileting Toileting steps completed by patient: Adjust clothing prior to toileting;Performs perineal hygiene;Adjust clothing after toileting Toileting Assistive Devices: Grab bar or rail for support Toileting: 4: Steadying assist  FIM - Diplomatic Services operational officer Devices: Grab bars Toilet Transfers: 4-To toilet/BSC: Min A (steadying Pt. > 75%);4-From toilet/BSC: Min A (steadying Pt. > 75%)  FIM - Bed/Chair Transfer Bed/Chair Transfer Assistive Devices: Arm rests;Walker Bed/Chair Transfer: 5: Chair or W/C > Bed: Supervision (verbal cues/safety issues);5: Bed > Chair or W/C: Supervision (verbal cues/safety issues);5: Supine > Sit: Supervision (verbal cues/safety issues);5: Sit > Supine: Supervision (verbal cues/safety issues)  FIM - Locomotion:  Wheelchair Distance: 150 Locomotion: Wheelchair: 0: Activity did not occur FIM - Locomotion: Ambulation Locomotion: Ambulation Assistive Devices: Environmental consultant -  Rolling Ambulation/Gait Assistance: 4: Min guard Locomotion: Ambulation: 4: Travels 150 ft or more with minimal assistance (Pt.>75%)  Comprehension Comprehension Mode: Auditory Comprehension: 5-Follows basic conversation/direction: With extra time/assistive device  Expression Expression Mode: Verbal Expression: 5-Expresses basic needs/ideas: With no assist  Social Interaction Social Interaction: 5-Interacts appropriately 90% of the time - Needs monitoring or encouragement for participation or interaction.  Problem Solving Problem Solving: 4-Solves basic 75 - 89% of the time/requires cueing 10 - 24% of the time  Memory Memory: 5-Recognizes or recalls 90% of the time/requires cueing < 10% of the time  Medical Problem List and Plan:  1. 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 DVT Prophylaxis/Anticoagulation: Subcutaneous Lovenox. Monitor platelet counts any signs of bleeding  3. Neuropsych: This patient is not capable of making decisions on his/her own behalf.   -neuropsych assessment for mood 4. Hypertension. . Patient on hydrochlorothiazide 25 mg daily prior to admission. On low dose ACE to start/ don't want to lower pressure too much--continue with current regimen 5. Tobacco/marijuana abuse. Continue NicoDerm patch and provide counseling  6. Hyperlipidemia. Lipitor   LOS (Days) 12 A FACE TO FACE EVALUATION WAS PERFORMED  Erick Colace 02/21/2013 10:28 AM

## 2013-02-21 NOTE — Progress Notes (Addendum)
Physical Therapy Note  Patient Details  Name: [name] MRN: [id number] Date of Birth: 12-06-1955 Today's Date: 02/21/2013  1420-1505 (45 minutes) individual Pain: no reported pain Focus of treatment: Neuro re-ed RT LE; gait training Treatment: Pt in bed upon arrival; supine to sit SBA (secondary to impulsive movements/decreased safety awareness); transfers vcs for brakes, assist to clear footrests, min assist stand /turn; sit to supine (mat) SBA; Neuro re-ed RT LE- active hip flexion/extension, knee extension/ flexion (AA for flexion) using powderboard+ skate gravity eliminated; stepping to 2 inch step on right - pt now able to actively flex RT hip to step to 2 inch height but unable to step off step (uses hip extension to drag foot off step) without assist. ; gait - 80-100 feet forward without AD min to close SBA; retropulsion- pt performs step to on right (unable to complete full step backward on right); gait sideways along rail x 2.    Craige Patel,JIM 02/21/2013, 2:43 PM

## 2013-02-21 NOTE — Plan of Care (Signed)
Problem: RH SAFETY Goal: RH STG ADHERE TO SAFETY PRECAUTIONS W/ASSISTANCE/DEVICE STG Adhere to Safety Precautions With mod I Assistance/Device.  Outcome: Not Progressing Patient continuing to be impulsive-getting out of safety belf in wheelchair. Upgrade safety plan to bring patient to desk.

## 2013-02-22 ENCOUNTER — Inpatient Hospital Stay (HOSPITAL_COMMUNITY): Payer: 59 | Admitting: Physical Therapy

## 2013-02-22 ENCOUNTER — Inpatient Hospital Stay (HOSPITAL_COMMUNITY): Payer: 59 | Admitting: Occupational Therapy

## 2013-02-22 ENCOUNTER — Inpatient Hospital Stay (HOSPITAL_COMMUNITY): Payer: 59 | Admitting: Speech Pathology

## 2013-02-22 DIAGNOSIS — I6340 Cerebral infarction due to embolism of unspecified cerebral artery: Secondary | ICD-10-CM

## 2013-02-22 DIAGNOSIS — I10 Essential (primary) hypertension: Secondary | ICD-10-CM

## 2013-02-22 DIAGNOSIS — F323 Major depressive disorder, single episode, severe with psychotic features: Secondary | ICD-10-CM

## 2013-02-22 NOTE — Progress Notes (Signed)
Speech Language Pathology Daily Session Note  Patient Details  Name: [name] MRN: [id number] Date of Birth: October 05, 1955  Today's Date: 02/22/2013 Time: 1330-1415 Time Calculation (min): 45 min  Short Term Goals: Week 2: SLP Short Term Goal 1 (Week 2): Pt will utilize call bell to request assistance with Min level verbal and question cues.  SLP Short Term Goal 2 (Week 2): Pt will utilize external memory aids to increase recall/carryover of newly learned informaiton with Min A verbal and question cues.  SLP Short Term Goal 3 (Week 2): Pt will demonstrate functional problem solving with basic and familair tasks with Supervisoin level verbal and question cues.  SLP Short Term Goal 4 (Week 2): Pt will self-monitor and correct verbal errors at the sentence level with Min A verbal cues. SLP Short Term Goal 5 (Week 2): Pt will utilize word-finding strategies with Min A verbal and question cues.   Skilled Therapeutic Interventions: Skilled treatment session focused on cognitive-linguistic goals.  SLP facilitated session with categorical naming activity with 2 restrictions and a time limit and Supervision level verbal cues to self-monitor and correct spelling at the word level as well as Min assist faded to Supervision level verbal cues to self-monitor and correct accuracy of written work given categorical restrictions.  Patient initiated discussion regarding discharge planning and insurance coverage for follow-up and equipment; SLP notified Social Investment banker, operational.    FIM:  Comprehension Comprehension Mode: Auditory Comprehension: 5-Follows basic conversation/direction: With extra time/assistive device Expression Expression Mode: Verbal Expression: 5-Expresses complex 90% of the time/cues < 10% of the time Social Interaction Social Interaction: 7-Interacts appropriately with others - No medications needed. Problem Solving Problem Solving: 5-Solves basic 90% of the time/requires cueing < 10%  of the time Memory Memory: 5-Recognizes or recalls 90% of the time/requires cueing < 10% of the time  Pain Pain Assessment Pain Assessment: No/denies pain  Therapy/Group: Individual Therapy  Charlane Ferretti., CCC-SLP [phone]  [name] 02/22/2013, 4:27 PM

## 2013-02-22 NOTE — Progress Notes (Signed)
Patient ID: [name], male   DOB: 1956-03-07, 57 y.o.   MRN: [id number] Subjective/Complaints: Slept well, no pain A 12 point review of systems has been performed and if not noted above is otherwise negative.   Objective: Vital Signs: Blood pressure 150/84, pulse 74, temperature 97.5 F (36.4 C), temperature source Oral, resp. rate 18, height 5\' 9"  (1.753 m), weight 80 kg (176 lb 5.9 oz), SpO2 98.00%. No results found. No results found for this basename: WBC, HGB, HCT, PLT,  in the last 72 hours No results found for this basename: NA, K, CL, CO, GLUCOSE, BUN, CREATININE, CALCIUM,  in the last 72 hours CBG (last 3)  No results found for this basename: GLUCAP,  in the last 72 hours  Wt Readings from Last 3 Encounters:  02/17/13 80 kg (176 lb 5.9 oz)  02/03/13 83.915 kg (185 lb)  02/03/13 83.915 kg (185 lb)    Physical Exam:  HENT:  Head: Normocephalic.  Eyes: EOM are normal.  Neck: Neck supple. No thyromegaly present.  Cardiovascular: Normal rate and regular rhythm.  Pulmonary/Chest: Effort normal and breath sounds normal. No respiratory distress.  Abdominal: Soft. Bowel sounds are normal. He exhibits no distension.  Neurological: He is alert.  Patient with word finding deficits, verbal apraxia. He was able to state his name   He did follow simple commands. RUE is tr to 3+/5 and inconsistent. RLE is trace to 2-at HF and KE and 0/5 distally with inconsistency as well and likely apraxia. Decreased sensation to light touch but could grossly sense pain. Persistent grasp right hand Skin: Skin is warm and dry.  Psychiatric:   affect brighter   Assessment/Plan: 1. 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 Physiatrist is providing close team supervision and 24 hour management of active medical problems listed below. Physiatrist and rehab team  continue to assess barriers to discharge/monitor patient progress toward functional and medical goals.  FIM: FIM - Bathing Bathing Steps Patient Completed: Chest;Right upper leg;Right Arm;Left Arm;Right lower leg (including foot);Left upper leg;Left lower leg (including foot);Abdomen;Buttocks;Front perineal area Bathing: 4: Steadying assist  FIM - Upper Body Dressing/Undressing Upper body dressing/undressing steps patient completed: Thread/unthread right sleeve of pullover shirt/dresss;Thread/unthread left sleeve of pullover shirt/dress;Put head through opening of pull over shirt/dress;Pull shirt over trunk Upper body dressing/undressing: 5: Set-up assist to: Obtain clothing/put away FIM - Lower Body Dressing/Undressing Lower body dressing/undressing steps patient completed: Thread/unthread right pants leg;Pull underwear up/down;Thread/unthread left underwear leg;Thread/unthread right underwear leg;Thread/unthread left pants leg;Pull pants up/down;Don/Doff right sock;Don/Doff left sock;Don/Doff right shoe;Don/Doff left shoe;Fasten/unfasten right shoe;Fasten/unfasten left shoe Lower body dressing/undressing: 4: Steadying Assist  FIM - Toileting Toileting steps completed by patient: Adjust clothing prior to toileting;Performs perineal hygiene;Adjust clothing after toileting Toileting Assistive Devices: Grab bar or rail for support Toileting: 0: Activity did not occur  FIM - Diplomatic Services operational officer Devices: Grab bars Toilet Transfers: 4-To toilet/BSC: Min A (steadying Pt. > 75%);4-From toilet/BSC: Min A (steadying Pt. > 75%)  FIM - Bed/Chair Transfer Bed/Chair Transfer Assistive Devices: Arm rests;Walker Bed/Chair Transfer: 5: Supine > Sit: Supervision (verbal cues/safety issues);4: Bed > Chair or W/C: Min A (steadying Pt. > 75%)  FIM - Locomotion: Wheelchair Distance: 150 Locomotion: Wheelchair: 0: Activity did not occur FIM - Locomotion: Ambulation Locomotion:  Ambulation Assistive Devices: Designer, industrial/product Ambulation/Gait Assistance: 4: Min guard Locomotion: Ambulation: 4: Travels 150 ft or more with minimal  assistance (Pt.>75%)  Comprehension Comprehension Mode: Auditory Comprehension: 5-Follows basic conversation/direction: With extra time/assistive device  Expression Expression Mode: Verbal Expression: 5-Expresses basic needs/ideas: With no assist  Social Interaction Social Interaction: 5-Interacts appropriately 90% of the time - Needs monitoring or encouragement for participation or interaction.  Problem Solving Problem Solving: 4-Solves basic 75 - 89% of the time/requires cueing 10 - 24% of the time  Memory Memory: 5-Recognizes or recalls 90% of the time/requires cueing < 10% of the time  Medical Problem List and Plan:  1. 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 DVT Prophylaxis/Anticoagulation: Subcutaneous Lovenox. Monitor platelet counts any signs of bleeding  3. Neuropsych: This patient is not capable of making decisions on his/her own behalf.   -neuropsych assessment for mood 4. Hypertension. . Patient on hydrochlorothiazide 25 mg daily prior to admission. On low dose ACE to start/ don't want to lower pressure too much--continue with current regimen 5. Tobacco/marijuana abuse. Continue NicoDerm patch and provide counseling  6. Hyperlipidemia. Lipitor   LOS (Days) 13 A FACE TO FACE EVALUATION WAS PERFORMED  Erick Colace 02/22/2013 9:18 AM

## 2013-02-22 NOTE — Progress Notes (Signed)
Occupational Therapy Session Note  Patient Details  Name: [name] MRN: [id number] Date of Birth: [birth year]  Today's Date: 02/22/2013 Time: 0830-0930 Time Calculation (min): 60 min  Short Term Goals: Week 2:  OT Short Term Goal 1 (Week 2): Pt will complete toilet transfer with min assist stand pivot OT Short Term Goal 2 (Week 2): Pt will complete shower transfer with min assist OT Short Term Goal 3 (Week 2): Pt will complete LB dressing with min assist OT Short Term Goal 4 (Week 2): Pt will complete 2 grooming tasks in standing with min assist for balance  Skilled Therapeutic Interventions/Progress Updates:    Pt seen for ADL retraining with focus on functional mobility with RW, bathing and dressing with hemi-technique, and functional use of RUE with self-care tasks and NM re-ed.  Pt in bed upon arrival and requested to complete bathing at sink level this session.  Pt with spontaneous use of RUE to wash LUE this session and increased motor control of wash cloth.  Demonstrated carryover of LB dressing with donning RLE first and maintaining control of RLE when crossed over Lt knee.  Pt required increased time to tie shoes, demonstrating increased functional use of RUE.  Engaged in pipe tree activity in standing with focus on motor control and grasp/release with RUE.  Pt continues to have some difficulty with passing item from Rt hand to Lt, focus on bimanual task with increased release with RUE.  FMC with stacking pennies and in-hand manipulation.  Pt with decreased motor planning with translating pennies from palm to fingers to place in slot, requiring increased time and verbal and demonstration cues.  Therapy Documentation Precautions:  Precautions Precautions: Fall Precaution Comments: Pt impulsive with poor awareness of deficits; Rt. inattention Restrictions Weight Bearing Restrictions: No General:   Vital Signs: Therapy Vitals Pulse Rate: 90 Resp: 15 BP: 138/98 mmHg Patient  Position, if appropriate: Sitting Pain: Pain Assessment Pain Assessment: No/denies pain  See FIM for current functional status  Therapy/Group: Individual Therapy  Leonette Monarch 02/22/2013, 11:56 AM

## 2013-02-22 NOTE — Progress Notes (Signed)
Physical Therapy Session Note  Patient Details  Name: [name] MRN: [id number] Date of Birth: 01-31-1956  Today's Date: 02/22/2013 Time: 4132-4401 and 1302-1330 Time Calculation (min): 60 min and 28 min  Short Term Goals: Week 2:  PT Short Term Goal 1 (Week 2): Patient will perform bed <> w/c transfers with consistent min A and improved RLE advancement and safe placement during pivoting PT Short Term Goal 2 (Week 2): Patient will perform w/c mobility x 150' in controlled environment with min A and improved coordination of LE propulsion and attention to R side PT Short Term Goal 3 (Week 2): Patient will perform gait in controlled environment with RW x 150' with min A and improved attention to R and improved ability to advance RLE during changes in direction PT Short Term Goal 4 (Week 2): Patient will perform stair negotiation up and down 4 stairs with 2 rails and min A with improved activation and advancement of RLE with 50% cues  Skilled Therapeutic Interventions/Progress Updates:   Pt performed gait x 300' in controlled environment with RW and supervision with improved safety with RW, gait velocity and RLE step length and foot clearance.  Performed simulated low car transfer training with patient standing and stepping LLE into first and then use of LUE to bring RLE into and out of car with supervision; second repetition patient sat first and then was able to bring bilat LE into car with LUE assisting RLE; when exiting car second repetition patient was able to elevate RLE and take out car without use of LUE to assist for clearance.  Also performed floor > furniture transfer training; verbalized and demonstrated safe sequence to patient and discussed indications for calling EMS; patient gave repeat demonstration x 2 reps; first repetition patient required total verbal cues and mod-max A to perform safely; patient still presents with impulsivity and inattention to RUE and LE placement and did not  advance RLE under him almost causing him to fall back to the ground.  Returned to floor with min-mod A; performed step by step cues for safe transfer from prone > quadruped > tall kneeling > half kneeling to stand and pivot to chair.  Will practice again before home.   Performed gait training with focus on RLE step length and foot clearance x 25' multiple repetitions while stepping over low and taller obstacles and toe tapping with R or L foot to cone with min A; pt demonstrating improved initiation of RLE swing and full clearance stepping over obstacles while maintaining forward momentum but unable to tap cone and bring foot back under COG; also had increased difficulty maintaining stance on RLE while tapping with LLE without UE support.  Performed Nustep at level 5 x 4 minutes with bilat UE and LE for endurance, coordination, initiation and strength training; patient fatigued very quickly; HR after 4 minutes 114 bpm. Changed to use of LE only but pt unable to continue.  Returned to room and to bed with supervision to rest before lunch.  PM session: pt resting in bed; performed ambulation in controlled environment x 150' x 2 with RW and supervision.  Continued gait training and RLE activation and clearance training on stairs with bilat UE support on rails up and down 5 stairs x 5-6 reps with focus on use of alternating sequence to ascend and descend to facilitate anterior weight shifting and full RLE step length/foot clearance.  Required step by step cues, visual demonstration and tactile cues to perform alternating sequence initially but  by final reps pt able to to perform with min A and no verbal cues for sequence.     Therapy Documentation Precautions:  Precautions Precautions: Fall Precaution Comments: Pt impulsive with poor awareness of deficits; Rt. inattention Restrictions Weight Bearing Restrictions: No Vital Signs: Therapy Vitals Pulse Rate: 90 Resp: 15 BP: 138/98 mmHg Patient Position, if  appropriate: Sitting Pain: Pain Assessment Pain Assessment: No/denies pain Locomotion : Ambulation Ambulation/Gait Assistance: 5: Supervision  Other Treatments: Treatments Neuromuscular Facilitation: Right;Lower Extremity;Activity to increase coordination;Activity to increase motor control;Activity to increase timing and sequencing;Activity to increase sustained activation beginning in standing with focus on activation of RLE hip and knee flexors > extensors to perform retro stepping with activation of proximal and distal hamstring muscle.  Patient unable to activate proximal and distal portion of hamstring at same time and tends to step back with knee extended.  Changed to supine on mat to continue NMR during alternating/reciprocal R and LLE heel slides on mat with focus on RLE initiation, sustained activation and eccentric control of hamstring muscle with min-mod A for full ROM and control RLE x 12 reps; performed bilat LE bridging with pillow squeeze for hip IR/ADD with min-mod A to maintain LE in midline x 8 reps.  Placed bilat feet on ball and performed bilat LE hamstring curls with therapy ball; added in bridge + curl but patient only able to activate distal hamstring on L, during bridge patient unable to activate distal R hamstring for knee flexion while proximal hamstring performing hip extension.  Will continue to address.    See FIM for current functional status  Therapy/Group: Individual Therapy  Edman Circle [facility] 02/22/2013, 1:36 PM

## 2013-02-22 NOTE — Progress Notes (Signed)
Social Work Patient ID: [name], male   DOB: [birth year], 56 y.o.   MRN: [id number] FMLA papers completed and left for wife in pt's room.  Working on discharge plans and follow up care. Pt is pleased with how well he is doing and hopeful he will continue with his progress.

## 2013-02-23 ENCOUNTER — Inpatient Hospital Stay (HOSPITAL_COMMUNITY): Payer: 59 | Admitting: Speech Pathology

## 2013-02-23 ENCOUNTER — Inpatient Hospital Stay (HOSPITAL_COMMUNITY): Payer: 59 | Admitting: Occupational Therapy

## 2013-02-23 ENCOUNTER — Inpatient Hospital Stay (HOSPITAL_COMMUNITY): Payer: 59 | Admitting: Physical Therapy

## 2013-02-23 LAB — CREATININE, SERUM
Creatinine, Ser: 1.07 mg/dL (ref 0.50–1.35)
GFR calc non Af Amer: 76 mL/min — ABNORMAL LOW (ref >90)

## 2013-02-23 NOTE — Progress Notes (Signed)
Occupational Therapy Session Note  Patient Details  Name: [name] MRN: [id number] Date of Birth: 1956-07-23  Today's Date: 02/23/2013 Time: 0830-0925 and 1300-1330 Time Calculation (min): 55 min and 30 min  Short Term Goals: Week 2:  OT Short Term Goal 1 (Week 2): Pt will complete toilet transfer with min assist stand pivot OT Short Term Goal 2 (Week 2): Pt will complete shower transfer with min assist OT Short Term Goal 3 (Week 2): Pt will complete LB dressing with min assist OT Short Term Goal 4 (Week 2): Pt will complete 2 grooming tasks in standing with min assist for balance  Skilled Therapeutic Interventions/Progress Updates:    1) Pt seen for ADL retraining with focus on transfers with RW, bathing at sit <> stand level with appropriate placement of RLE during standing, and hemi-technique with bathing and dressing.  Pt ambulated to walk-in shower with RW and min assist during transfer to shower.  Pt with improved safety with bathing this session with appropriate RLE placement prior to standing.  Pt completing UB and LB dressing with setup assist and close supervision when standing to pull up pants.  Engaged in therapeutic activity in standing with focus on weight shifting in standing and use of RUE with writing.  Pt with perseveration on letters when trying to spell sports teams in a modified hangman style activity.  Pt reports plan to d/c home tomorrow, and became frustrated when explained to him the need for a few more days for family education and need for a few more days to meet goals prior to d/c.    2) Pt seen for 1:1 OT with focus on RUE use during therapeutic activities.  Engaged in Wii tennis per request of pt, however pt easily frustrated when RUE did not demonstrate automatic movements to complete activity and pt requesting new activity.  Focus on BUE use with tossing ball into hoop progressing from sitting to standing, mod cues to engage RUE in throwing ball as pt tended to  overcompensate with LUE.  Ambulated with supervision from therapy gym to dayroom for SLP session.  Therapy Documentation Precautions:  Precautions Precautions: Fall Precaution Comments: Pt impulsive with poor awareness of deficits; Rt. inattention Restrictions Weight Bearing Restrictions: No General: General Amount of Missed OT Time (min): 5 Minutes Vital Signs: Therapy Vitals Temp: 97.6 F (36.4 C) Temp src: Oral Pulse Rate: 83 Resp: 18 BP: 132/93 mmHg Patient Position, if appropriate: Sitting Oxygen Therapy SpO2: 100 % O2 Device: None (Room air) Pain: Pain Assessment Pain Assessment: No/denies pain  See FIM for current functional status  Therapy/Group: Individual Therapy  Leonette Monarch 02/23/2013, 10:12 AM

## 2013-02-23 NOTE — Progress Notes (Signed)
Patient ID: [name], male   DOB: March 18, 1956, 57 y.o.   MRN: [id number] Subjective/Complaints: Patient is wanting to go home tomorrow. Discussed with OT not quite ready but she should be old to move up discharge date from the original date of 03/02/2013 A 12 point review of systems has been performed and if not noted above is otherwise negative.   Objective: Vital Signs: Blood pressure 132/93, pulse 83, temperature 97.6 F (36.4 C), temperature source Oral, resp. rate 18, height 5\' 9"  (1.753 m), weight 80 kg (176 lb 5.9 oz), SpO2 100.00%. No results found. No results found for this basename: WBC, HGB, HCT, PLT,  in the last 72 hours  Recent Labs  02/23/13 0545  CREATININE 1.07   CBG (last 3)  No results found for this basename: GLUCAP,  in the last 72 hours  Wt Readings from Last 3 Encounters:  02/17/13 80 kg (176 lb 5.9 oz)  02/03/13 83.915 kg (185 lb)  02/03/13 83.915 kg (185 lb)    Physical Exam:  HENT:  Head: Normocephalic.  Eyes: EOM are normal.  Neck: Neck supple. No thyromegaly present.  Cardiovascular: Normal rate and regular rhythm.  Pulmonary/Chest: Effort normal and breath sounds normal. No respiratory distress.  Abdominal: Soft. Bowel sounds are normal. He exhibits no distension.  Neurological: He is alert.  Patient with word finding deficits, verbal apraxia. He was able to state his name   He did follow simple commands. RUE is tr to 3+/5 and inconsistent. RLE is trace to 2-at HF and KE and 0/5 distally with inconsistency as well and likely apraxia. Decreased sensation to light touch but could grossly sense pain. Persistent grasp right hand Skin: Skin is warm and dry.  Psychiatric:   affect brighter, became upset after we discussed that tomorrow we'll not be discharge date   Assessment/Plan: 1. Functional deficits secondary to embolic left anterior circulation infarct which require 3+ hours per day of interdisciplinary therapy in a comprehensive inpatient  rehab setting. Physiatrist is providing close team supervision and 24 hour management of active medical problems listed below. Physiatrist and rehab team continue to assess barriers to discharge/monitor patient progress toward functional and medical goals.  FIM: FIM - Bathing Bathing Steps Patient Completed: Chest;Right upper leg;Right Arm;Left Arm;Right lower leg (including foot);Left upper leg;Left lower leg (including foot);Abdomen;Buttocks;Front perineal area Bathing: 4: Steadying assist  FIM - Upper Body Dressing/Undressing Upper body dressing/undressing steps patient completed: Thread/unthread right sleeve of pullover shirt/dresss;Thread/unthread left sleeve of pullover shirt/dress;Put head through opening of pull over shirt/dress;Pull shirt over trunk Upper body dressing/undressing: 5: Set-up assist to: Obtain clothing/put away FIM - Lower Body Dressing/Undressing Lower body dressing/undressing steps patient completed: Thread/unthread right pants leg;Pull underwear up/down;Thread/unthread left underwear leg;Thread/unthread right underwear leg;Thread/unthread left pants leg;Pull pants up/down;Don/Doff right sock;Don/Doff left sock;Don/Doff right shoe;Don/Doff left shoe;Fasten/unfasten right shoe;Fasten/unfasten left shoe Lower body dressing/undressing: 4: Steadying Assist  FIM - Toileting Toileting steps completed by patient: Adjust clothing prior to toileting;Performs perineal hygiene;Adjust clothing after toileting Toileting Assistive Devices: Grab bar or rail for support Toileting: 0: Activity did not occur  FIM - Diplomatic Services operational officer Devices: Grab bars Toilet Transfers: 4-To toilet/BSC: Min A (steadying Pt. > 75%);4-From toilet/BSC: Min A (steadying Pt. > 75%)  FIM - Bed/Chair Transfer Bed/Chair Transfer Assistive Devices: Arm rests;Walker Bed/Chair Transfer: 5: Supine > Sit: Supervision (verbal cues/safety issues);5: Sit > Supine: Supervision (verbal  cues/safety issues);5: Bed > Chair or W/C: Supervision (verbal cues/safety issues);5: Chair or W/C > Bed: Supervision (verbal  cues/safety issues)  FIM - Locomotion: Wheelchair Distance: 150 Locomotion: Wheelchair: 0: Activity did not occur FIM - Locomotion: Ambulation Locomotion: Ambulation Assistive Devices: Designer, industrial/product Ambulation/Gait Assistance: 5: Supervision Locomotion: Ambulation: 5: Travels 150 ft or more with supervision/safety issues  Comprehension Comprehension Mode: Auditory Comprehension: 5-Follows basic conversation/direction: With extra time/assistive device  Expression Expression Mode: Verbal Expression: 5-Expresses basic 90% of the time/requires cueing < 10% of the time.  Social Interaction Social Interaction: 5-Interacts appropriately 90% of the time - Needs monitoring or encouragement for participation or interaction.  Problem Solving Problem Solving: 5-Solves basic 90% of the time/requires cueing < 10% of the time  Memory Memory: 5-Recognizes or recalls 90% of the time/requires cueing < 10% of the time  Medical Problem List and Plan:  1. 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 DVT Prophylaxis/Anticoagulation: Subcutaneous Lovenox. Monitor platelet counts any signs of bleeding  3. Neuropsych: This patient is not capable of making decisions on his/her own behalf.   -neuropsych assessment for mood 4. Hypertension. . Patient on hydrochlorothiazide 25 mg daily prior to admission. On low dose ACE to start/ don't want to lower pressure too much--continue with current regimen 5. Tobacco/marijuana abuse. Continue NicoDerm patch and provide counseling  6. Hyperlipidemia. Lipitor   LOS (Days) 14 A FACE TO FACE EVALUATION WAS PERFORMED  Rambo Sarafian E 02/23/2013 10:00 AM

## 2013-02-23 NOTE — Progress Notes (Signed)
Speech Language Pathology Daily Session Note  Patient Details  Name: [name] MRN: [id number] Date of Birth: 1956/05/05  Today's Date: 02/23/2013 Time: 1335-1430 Time Calculation (min): 55 min  Short Term Goals: Week 2: SLP Short Term Goal 1 (Week 2): Pt will utilize call bell to request assistance with Min level verbal and question cues.  SLP Short Term Goal 2 (Week 2): Pt will utilize external memory aids to increase recall/carryover of newly learned informaiton with Min A verbal and question cues.  SLP Short Term Goal 3 (Week 2): Pt will demonstrate functional problem solving with basic and familair tasks with Supervisoin level verbal and question cues.  SLP Short Term Goal 4 (Week 2): Pt will self-monitor and correct verbal errors at the sentence level with Min A verbal cues. SLP Short Term Goal 5 (Week 2): Pt will utilize word-finding strategies with Min A verbal and question cues.   Skilled Therapeutic Interventions: Skilled treatment session with peer, which focused on addressing cognitive-linguistic goals.  SLP facilitated session with Supervision level verbal cues to self monitor and correct basic verbal expression; Min assist level verbal and visual cues for complex problem solving and to recall and follow procedures of the new learning task.  Patient engaged in conversation with peer appropriately.     FIM:  Comprehension Comprehension Mode: Auditory Comprehension: 5-Follows basic conversation/direction: With extra time/assistive device Expression Expression Mode: Verbal Expression: 5-Expresses complex 90% of the time/cues < 10% of the time Social Interaction Social Interaction: 7-Interacts appropriately with others - No medications needed. Problem Solving Problem Solving: 5-Solves basic 90% of the time/requires cueing < 10% of the time Memory Memory: 5-Recognizes or recalls 90% of the time/requires cueing < 10% of the time  Pain Pain Assessment Pain Assessment:  No/denies pain  Therapy/Group: Individual Therapy  [name]., CCC-SLP [phone]  [name] 02/23/2013, 4:09 PM

## 2013-02-23 NOTE — Progress Notes (Signed)
Physical Therapy Weekly Progress Note  Patient Details  Name: [name] MRN: [id number] Date of Birth: Sep 21, 1955  Today's Date: 02/23/2013 Time: 1105-1200 Time Calculation (min): 55 min  Patient has made excellent progress and has met 4 of 4 short term goals and 10 out of 12 LTG .  Pt is currently supervision for bed mobility and basic transfers, gait with RW in controlled environment, min A for stair negotiation and gait without AD or in community setting.  Pt. D/C set for Tuesday 02/23/13 but secondary to significant progress pt may be ready to D/C at end of this week if pt is also meeting OT and SLP goals.  Will need to have wife come in for family education and functional mobility training prior to D/C.    Patient continues to demonstrate the following deficits: impaired R sided strength, initiation, motor planning ands sequencing, motor control and coordination, apraxia, impaired postural control, dynamic balance, gait and therefore will continue to benefit from skilled PT intervention to enhance overall performance with balance, postural control, ability to compensate for deficits, functional use of  right upper extremity and right lower extremity, attention, awareness and coordination.  Patient progressing toward long term goals..  Plan of care revisions: will discuss with team if pt is ready for earlier D/C.  PT Short Term Goals Week 1:  PT Short Term Goal 1 (Week 1): Patient will perform bed mobility from flat bed, no rails to L and R with min A and improved attention and initiation with RUE and RLE PT Short Term Goal 1 - Progress (Week 1): Met PT Short Term Goal 2 (Week 1): Patient will peformed bed <> w/c transfers mod A to L and R with improved attention and initiation of RLE to pivot with 75% cues PT Short Term Goal 2 - Progress (Week 1): Met PT Short Term Goal 3 (Week 1): Patient will perform w/c mobility x 150' in controlled environment with L hemi technique and max A with 75%  cues for sequencing and attention to R PT Short Term Goal 3 - Progress (Week 1): Met PT Short Term Goal 4 (Week 1): Patient will perform gait in controlled environment x 50' with LRAD and max A of one person and improved initiation and advancement of RLE with 75% cues PT Short Term Goal 4 - Progress (Week 1): Met Week 2:  PT Short Term Goal 1 (Week 2): Patient will perform bed <> w/c transfers with consistent min A and improved RLE advancement and safe placement during pivoting PT Short Term Goal 1 - Progress (Week 2): Met PT Short Term Goal 2 (Week 2): Patient will perform w/c mobility x 150' in controlled environment with min A and improved coordination of LE propulsion and attention to R side PT Short Term Goal 2 - Progress (Week 2): Discontinued (comment) (pt ambulatory on unit) PT Short Term Goal 3 (Week 2): Patient will perform gait in controlled environment with RW x 150' with min A and improved attention to R and improved ability to advance RLE during changes in direction PT Short Term Goal 3 - Progress (Week 2): Met PT Short Term Goal 4 (Week 2): Patient will perform stair negotiation up and down 4 stairs with 2 rails and min A with improved activation and advancement of RLE with 50% cues PT Short Term Goal 4 - Progress (Week 2): Met Week 3:  PT Short Term Goal 1 (Week 3): = LTG   Therapy Documentation Precautions:  Precautions Precautions: Fall Precaution  Comments: Pt impulsive with poor awareness of deficits; Rt. inattention Restrictions Weight Bearing Restrictions: No Pain: Pain Assessment Pain Assessment: No/denies pain Locomotion : Ambulation Ambulation/Gait Assistance: 5: Supervision x 150' in controlled environment with RW.  Transferred sit <> supine <> prone and sidelying with supervision on flat mat.   Other Treatments: Treatments Neuromuscular Facilitation: Right;Upper Extremity;Lower Extremity;Activity to increase coordination;Activity to increase motor  control;Activity to increase timing and sequencing;Activity to increase grading;Activity to increase sustained activation beginning in sidelying with RLE on powder board with focus on isolated initiation and sustained activation of hip flexor, quad, hamstring and glutes for gravity minimized isolated hip flexion <> extension, knee flexion <> extension and then combination of movements to mimic gait: hip and knee flexion <> extension.  Removed powder board and transitioned to prone for alternating hamstring curls with tactile cues at hamstring mm belly and AAROM needed for RLE against gravity and without visual feedback; transitioned to supine and performed bilat LE bridges for proximal and distal hamstring activation in midline and then adding in anterior/posterior trunk rotations following R hip PROM/stretching into IR.  Returned to L sidelying and performed NMR for RUE and core activation during reaching with RUE out of BOS for ring and then placing on target at feet for oblique activation and lateral trunk shortening <> elongation x 12 reps.  Continued NMR in standing for hamstring activation training during retro stepping; still attempts to compensate with trunk flexion and straight leg extension even with manual facilitation for knee flexion initiation.  Changed to holding // bars and activation of hamstring for foot taps on/off of 2" step; pt again attempting to compensate with trunk flexion and straight leg extension; if trunk maintained upright and L hip in neutral pt able to initiate and activate hamstring to advance RLE forwards but only able to advance backwards with isolated hamstring x 1.  Returned to room to EOB for lunch with supervision.  Pt wife to be here Thursday for family education and pt to D/C home after education.    See FIM for current functional status  Therapy/Group: Individual Therapy  Edman Circle [facility] 02/23/2013, 12:06 PM

## 2013-02-23 NOTE — Progress Notes (Signed)
Social Work Patient ID: [name], male   DOB: 10/25/1955, 57 y.o.   MRN: [id number] Spoke with wife via telephone who can come in Thurs 9-12 for family education.  Discussed if goes well and no medical issues then could discharge Thurs afternoon. Pt aware and Pam-PA.  Will check with MD.

## 2013-02-24 ENCOUNTER — Inpatient Hospital Stay (HOSPITAL_COMMUNITY): Payer: 59 | Admitting: Occupational Therapy

## 2013-02-24 ENCOUNTER — Inpatient Hospital Stay (HOSPITAL_COMMUNITY): Payer: 59 | Admitting: Speech Pathology

## 2013-02-24 ENCOUNTER — Inpatient Hospital Stay (HOSPITAL_COMMUNITY): Payer: 59 | Admitting: Physical Therapy

## 2013-02-24 NOTE — Progress Notes (Signed)
Occupational Therapy Session Note  Patient Details  Name: [name] MRN: [id number] Date of Birth: [birth year]  Today's Date: 02/24/2013 Time: [phone] and [id number] Time Calculation (min): 55 min and 27 min  Short Term Goals: Week 2:  OT Short Term Goal 1 (Week 2): Pt will complete toilet transfer with min assist stand pivot OT Short Term Goal 2 (Week 2): Pt will complete shower transfer with min assist OT Short Term Goal 3 (Week 2): Pt will complete LB dressing with min assist OT Short Term Goal 4 (Week 2): Pt will complete 2 grooming tasks in standing with min assist for balance  Skilled Therapeutic Interventions/Progress Updates:    1) Pt seen for ADL retraining with focus on ambulation with and without RW, transfers, and hemi-technique with bathing and dressing.  Pt ambulated to bathroom without AD and performed shower transfer with min assist with fewer verbal cues due to RW not becoming an obstacle.  Pt with increased safety with RLE placement with sit <> stand in shower, continues to require cues when completing dressing tasks.  Grooming conducted in standing with supervision/cues for use of RUE instead of mouth to compensate.  Ambulated to laundry room with RW with supervision and cues for use of RUE with starting laundry.  Engaged in tub/shower transfer with use of tub bench with min assist and min cues for technique and appropriate RW placement.  Engaged in therapeutic activity in sitting progressing to standing with use of rebounder to promote quick reactions with catch and release.  Pt continues to have difficulty with automatic reactions with RUE.  2) Pt seen for 1:1 OT with focus on functional use of RUE, visual attention and scanning in busy background, and verbally describing cards during "spot it" activity.  Pt with increased carryover of turning over cards with Rt hand and scanning multiple objects on card to identify symbol that matched previous card. Min question cues to  encourage word finding and descriptive terms when unable to identify correct name.  Increased challenge with matching multiple cards with pt with quicker response time.  Educated pt on use of descriptive words to describe something if/when he has difficulty finding correct word and to use this technique at home when trying to explain something to wife.  Therapy Documentation Precautions:  Precautions Precautions: Fall Precaution Comments: Pt impulsive with poor awareness of deficits; Rt. inattention Restrictions Weight Bearing Restrictions: No Pain:  Pt with no c/o pain this session.  See FIM for current functional status  Therapy/Group: Individual Therapy  Leonette Monarch 02/24/2013, 9:26 AM

## 2013-02-24 NOTE — Patient Care Conference (Signed)
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 CVA (cerebral infarction) 02/03/2013  . HTN (hypertension) 02/03/2013  . Tobacco abuse 02/03/2013    Expected Discharge Date: Expected Discharge Date: 02/25/13  Team Members Present: Physician leading conference: Dr. 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         Bowel/Bladder   continent of bowel/bladder, uses urinal, lbm 6/10  continent bowel/bladder  encourage pt to use bathroom for all toileting   Swallow/Nutrition/ Hydration             ADL's   min assist bathing, supervision UB and LB dressing, min assist transfers  supervision overall, min assist shower and toilet transfers  NM Re-ed, RUE functional use, D/C planning, family ed   Mobility   Supervision overall except min A stairs  supervision-min A overall  RLE motor control, initiation and safety with all mobility, D/C planning, family education   Communication   Supervision-Min assist  Supervision   increase self-monitoring and correcting   Safety/Cognition/ Behavioral Observations  Min-Mod assist  Min assist  increase use of external aids    Pain   no c/o pain  no c/o pain  monitor   Skin   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  Barriers to Discharge: need family ed    Possible Resolutions to Barriers:  move up family ed    Discharge Planning/Teaching Needs:  Family education Thurs and discharge later afternoon.  OP arranged and pt ready for discharge      Team Discussion:  Preparing for d/c tomorrow after family ed complete.  Meeting supervision goals.  Still with problems spilling urinal - recommend timed toileting.   Revisions to Treatment Plan:  Change in d/c date to 6/12 if all goes well with family ed.   Continued Need for Acute Rehabilitation Level of Care: The patient requires daily medical management by a physician with specialized training in physical medicine and rehabilitation for the following conditions: Daily direction of a multidisciplinary physical rehabilitation program to ensure safe treatment while eliciting the highest outcome that is of practical value to the patient.: Yes Daily medical management of patient stability for increased activity during participation in an intensive rehabilitation regime.: Yes Daily analysis of laboratory values and/or radiology reports with any subsequent need for medication adjustment of medical intervention for : Neurological problems  Tamari Redwine 02/24/2013, 3:58 PM

## 2013-02-24 NOTE — Progress Notes (Signed)
Speech Language Pathology Daily Session Note  Patient Details  Name: [name] MRN: [id number] Date of Birth: [birth year]  Today's Date: 02/24/2013 Time: 8413-2440 Time Calculation (min): 40 min  Short Term Goals: Week 2: SLP Short Term Goal 1 (Week 2): Pt will utilize call bell to request assistance with Min level verbal and question cues.  SLP Short Term Goal 2 (Week 2): Pt will utilize external memory aids to increase recall/carryover of newly learned informaiton with Min A verbal and question cues.  SLP Short Term Goal 3 (Week 2): Pt will demonstrate functional problem solving with basic and familair tasks with Supervisoin level verbal and question cues.  SLP Short Term Goal 4 (Week 2): Pt will self-monitor and correct verbal errors at the sentence level with Min A verbal cues. SLP Short Term Goal 5 (Week 2): Pt will utilize word-finding strategies with Min A verbal and question cues.   Skilled Therapeutic Interventions: Skilled treatment session focused on addressing cognitive-linguistic goals.  SLP facilitated session with categorical naming task given to different restrictions and Supervision-Min assist verbal cues to utilize word finding strategies and Min assist written cues to self-monitor and correct word level written expression.  Patient able to independently route find to and from session; however, required Supervision verbal cues due to impulsivity.     FIM:  Comprehension Comprehension Mode: Auditory Comprehension: 5-Follows basic conversation/direction: With extra time/assistive device Expression Expression Mode: Verbal Expression: 5-Expresses complex 90% of the time/cues < 10% of the time Social Interaction Social Interaction: 7-Interacts appropriately with others - No medications needed. Problem Solving Problem Solving: 5-Solves basic 90% of the time/requires cueing < 10% of the time Memory Memory: 5-Recognizes or recalls 90% of the time/requires cueing < 10% of  the time  Pain Pain Assessment Pain Assessment: No/denies pain Pain Score: 0-No pain  Therapy/Group: Individual Therapy  [name]., CCC-SLP [phone]  [name] 02/24/2013, 2:34 PM

## 2013-02-24 NOTE — Progress Notes (Signed)
Physical Therapy Session Note  Patient Details  Name: [name] MRN: [id number] Date of Birth: 1956-07-28  Today's Date: 02/24/2013 Time: 1102-1156 Time Calculation (min): 54 min  Short Term Goals: Week 3:  PT Short Term Goal 1 (Week 3): = LTG  Skilled Therapeutic Interventions/Progress Updates:    See below for tx details.  Other than listed: car transfer with RW supervision, verbal cues for safety.  Pt continues to have decreased safety awareness and decreased attention to the right side during gait activities (stairs and turning 180 and 360 degrees with RW).  He gets frustrated with excessive cueing ("i know"), but needs cueing to be safe.   Therapy Documentation Precautions:  Precautions Precautions: Fall Precaution Comments: Pt impulsive with poor awareness of deficits; Rt. inattention Restrictions Weight Bearing Restrictions: No   Pain: Pain Assessment Pain Assessment: No/denies pain Pain Score: 0-No pain Mobility: Bed Mobility Supine to Sit: 6: Modified independent (Device/Increase time);HOB flat;Other (comment) (no rails) Sitting - Scoot to Edge of Bed: 6: Modified independent (Device/Increase time) Sit to Supine: 6: Modified independent (Device/Increase time);HOB flat;Other (comment) (no rails, extra time needed to get right foot into bed) Transfers Sit to Stand: 6: Modified independent (Device/Increase time) Stand to Sit: 6: Modified independent (Device/Increase time) Stand Pivot Transfers: 6: Modified independent (Device/Increase time) Stand Pivot Transfer Details (indicate cue type and reason): pt comes to standing easily with minimal use of hands Locomotion : Ambulation Ambulation: Yes Ambulation/Gait Assistance: 5: Supervision Ambulation Distance (Feet): 200 Feet Assistive device: Rolling walker Ambulation/Gait Assistance Details: verbal cues to stay inside of RW while turning.   High Level Ambulation High Level Ambulation: Side stepping;Backwards  walking Side Stepping: with bil hand support supervision Backwards Walking: with one hand support min assist and verbal cues for technique.  (to lift foot, not drag it back) Stairs / Additional Locomotion Stairs: Yes Stairs Assistance: 5: Supervision Stairs Assistance Details: Verbal cues for precautions/safety;Verbal cues for technique;Verbal cues for sequencing Stairs Assistance Details (indicate cue type and reason): verbal cues for safety on the steps.  Pt will keep going on steps before he confirms that his foot placement is all the way on the step Stair Management Technique: Two rails;Alternating pattern;Forwards Number of Stairs: 10 Height of Stairs:  (4-6") Ramp: 5: Programme researcher, broadcasting/film/video: Yes Wheelchair Assistance: 5: Financial planner Details: Verbal cues for technique;Verbal cues for Copy: Both lower extermities;Left lower extremity Wheelchair Parts Management: Needs assistance Distance: 200     Balance: Static Sitting Balance Static Sitting - Balance Support: No upper extremity supported;Feet supported Static Sitting - Level of Assistance: 7: Independent Dynamic Sitting Balance Dynamic Sitting - Balance Support: No upper extremity supported;Feet supported Dynamic Sitting - Level of Assistance: 6: Modified independent (Device/Increase time) Dynamic Sitting Balance - Compensations: one hand support for balance when outside of BOS Static Standing Balance Static Standing - Balance Support: No upper extremity supported Static Standing - Level of Assistance: 5: Stand by assistance Dynamic Standing Balance Dynamic Standing - Balance Support: Left upper extremity supported;Right upper extremity supported;During functional activity Dynamic Standing - Level of Assistance: 5: Stand by assistance  See FIM for current functional status  Therapy/Group: Individual Therapy  Lurena Joiner B. Jacques Willingham, PT, DPT  [phone]   02/24/2013, 12:07 PM

## 2013-02-24 NOTE — Progress Notes (Signed)
Patient ID: [name], male   DOB: 02-10-1956, 57 y.o.   MRN: [id number] Subjective/Complaints: Patient is wanting to go home prior to original date of 03/02/2013 A 12 point review of systems has been performed and if not noted above is otherwise negative.   Objective: Vital Signs: Blood pressure 140/100, pulse 76, temperature 98.4 F (36.9 C), temperature source Oral, resp. rate 17, height 5\' 9"  (1.753 m), weight 80 kg (176 lb 5.9 oz), SpO2 98.00%. No results found. No results found for this basename: WBC, HGB, HCT, PLT,  in the last 72 hours  Recent Labs  02/23/13 0545  CREATININE 1.07   CBG (last 3)  No results found for this basename: GLUCAP,  in the last 72 hours  Wt Readings from Last 3 Encounters:  02/17/13 80 kg (176 lb 5.9 oz)  02/03/13 83.915 kg (185 lb)  02/03/13 83.915 kg (185 lb)    Physical Exam:  HENT:  Head: Normocephalic.  Eyes: EOM are normal.  Neck: Neck supple. No thyromegaly present.  Cardiovascular: Normal rate and regular rhythm.  Pulmonary/Chest: Effort normal and breath sounds normal. No respiratory distress.  Abdominal: Soft. Bowel sounds are normal. He exhibits no distension.  Neurological: He is alert.  Patient with word finding deficits, verbal apraxia. He was able to state his name   He did follow simple commands. RUE is tr to 3+/5 and inconsistent. RLE is trace to 2-at HF and KE and 0/5 distally with inconsistency as well and likely apraxia. Decreased sensation to light touch but could grossly sense pain. Persistent grasp right hand Skin: Skin is warm and dry.  Psychiatric:   affect appropriate   Assessment/Plan: 1. 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 Physiatrist is providing close team supervision and 24 hour management of active medical problems listed below. Physiatrist and rehab team continue to  assess barriers to discharge/monitor patient progress toward functional and medical goals.  Team conference today please see physician documentation under team conference tab, met with team face-to-face to discuss problems,progress, and goals. Formulized individual treatment plan based on medical history, underlying problem and comorbidities.  FIM: FIM - Bathing Bathing Steps Patient Completed: Chest;Right upper leg;Right Arm;Left Arm;Right lower leg (including foot);Left upper leg;Left lower leg (including foot);Abdomen;Buttocks;Front perineal area Bathing: 5: Supervision: Safety issues/verbal cues  FIM - Upper Body Dressing/Undressing Upper body dressing/undressing steps patient completed: Thread/unthread right sleeve of pullover shirt/dresss;Thread/unthread left sleeve of pullover shirt/dress;Put head through opening of pull over shirt/dress;Pull shirt over trunk Upper body dressing/undressing: 5: Set-up assist to: Obtain clothing/put away FIM - Lower Body Dressing/Undressing Lower body dressing/undressing steps patient completed: Thread/unthread right pants leg;Pull underwear up/down;Thread/unthread left underwear leg;Thread/unthread right underwear leg;Thread/unthread left pants leg;Pull pants up/down;Don/Doff right sock;Don/Doff left sock;Don/Doff right shoe;Don/Doff left shoe;Fasten/unfasten right shoe;Fasten/unfasten left shoe Lower body dressing/undressing: 5: Set-up assist to: Obtain clothing  FIM - Toileting Toileting steps completed by patient: Adjust clothing prior to toileting;Performs perineal hygiene;Adjust clothing after toileting Toileting Assistive Devices: Grab bar or rail for support Toileting: 0: Activity did not occur  FIM - Diplomatic Services operational officer Devices: Grab bars Toilet Transfers: 4-To toilet/BSC: Min A (steadying Pt. > 75%);4-From toilet/BSC: Min A (steadying Pt. > 75%)  FIM - Bed/Chair Transfer Bed/Chair Transfer Assistive Devices: Arm  rests;Walker Bed/Chair Transfer: 5: Supine > Sit: Supervision (verbal cues/safety issues);5: Sit > Supine: Supervision (verbal cues/safety issues);5: Bed > Chair or W/C: Supervision (verbal  cues/safety issues);5: Chair or W/C > Bed: Supervision (verbal cues/safety issues)  FIM - Locomotion: Wheelchair Distance: 150 Locomotion: Wheelchair: 0: Activity did not occur FIM - Locomotion: Ambulation Locomotion: Ambulation Assistive Devices: Designer, industrial/product Ambulation/Gait Assistance: 5: Supervision Locomotion: Ambulation: 5: Travels 150 ft or more with supervision/safety issues  Comprehension Comprehension Mode: Auditory Comprehension: 5-Follows basic conversation/direction: With no assist  Expression Expression Mode: Verbal Expression: 5-Expresses basic needs/ideas: With no assist  Social Interaction Social Interaction: 5-Interacts appropriately 90% of the time - Needs monitoring or encouragement for participation or interaction.  Problem Solving Problem Solving: 5-Solves complex 90% of the time/cues < 10% of the time  Memory Memory: 5-Recognizes or recalls 90% of the time/requires cueing < 10% of the time  Medical Problem List and Plan:  1. 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 DVT Prophylaxis/Anticoagulation: Subcutaneous Lovenox. Monitor platelet counts any signs of bleeding  3. Neuropsych: This patient is not capable of making decisions on his/her own behalf.   -neuropsych assessment for mood 4. Hypertension. . Patient on hydrochlorothiazide 25 mg daily prior to admission. On low dose ACE to start/ don't want to lower pressure too much--continue with current regimen 5. Tobacco/marijuana abuse. Continue NicoDerm patch and provide counseling  6. Hyperlipidemia. Lipitor   LOS (Days) 15 A FACE TO FACE EVALUATION WAS PERFORMED  [name] E 02/24/2013 10:00 AM

## 2013-02-25 ENCOUNTER — Inpatient Hospital Stay (HOSPITAL_COMMUNITY): Payer: 59 | Admitting: Occupational Therapy

## 2013-02-25 ENCOUNTER — Inpatient Hospital Stay (HOSPITAL_COMMUNITY): Payer: 59 | Admitting: Speech Pathology

## 2013-02-25 ENCOUNTER — Inpatient Hospital Stay (HOSPITAL_COMMUNITY): Payer: 59 | Admitting: *Deleted

## 2013-02-25 MED ORDER — ATORVASTATIN CALCIUM 10 MG PO TABS: 10.0000 mg | ORAL_TABLET | Freq: Every day | ORAL | Status: DC

## 2013-02-25 MED ORDER — ASPIRIN 325 MG PO TABS: 325.0000 mg | ORAL_TABLET | Freq: Every day | ORAL | Status: DC

## 2013-02-25 MED ORDER — LISINOPRIL 10 MG PO TABS: 10.0000 mg | ORAL_TABLET | Freq: Every day | ORAL | Status: DC

## 2013-02-25 MED ORDER — NICOTINE 14 MG/24HR TD PT24: MEDICATED_PATCH | TRANSDERMAL | Status: DC

## 2013-02-25 NOTE — Progress Notes (Signed)
Physical Therapy Discharge Summary  Patient Details  Name: [name] MRN: [id number] Date of Birth: Jan 31, 1956  Today's Date: 02/25/2013 Time: 10:45-11:30 ( )   Patient has met 12 of 12 long term goals due to improved balance, increased strength, ability to compensate for deficits, functional use of  right lower extremity, improved attention and improved awareness.  Patient to discharge at an ambulatory level Supervision.   Patient's care partner is independent to provide the necessary cognitive assistance at discharge.   Recommendation:  Patient will benefit from ongoing skilled PT services in outpatient setting to continue to advance safe functional mobility, address ongoing impairments in motor control, coordination, balance, and apraxia and minimize fall risk.  Equipment: RW  Reasons for discharge: treatment goals met  Patient/family agrees with progress made and goals achieved: Yes  PT Discharge Precautions/Restrictions Precautions Precautions: Fall Precaution Comments: Pt impulsive with poor awareness of deficits; Rt. inattention Restrictions Weight Bearing Restrictions: No    Pain - none    Vision/Perception  Vision - History Baseline Vision: No visual deficits Vision - Assessment Eye Alignment: Within Functional Limits Perception Perception: Impaired Inattention/Neglect: Does not attend to right side of body Praxis Praxis: Impaired Praxis Impairment Details: Motor planning  Cognition Arousal/Alertness: Awake/alert Orientation Level: Oriented X4 Sensation Sensation Light Touch: Impaired Detail Light Touch Impaired Details: Impaired RLE;Impaired LUE Stereognosis: Not tested Hot/Cold: Not tested Proprioception: Impaired by gross assessment Additional Comments: Pt reports decreased light touch in L LE and UE, discussed safety implications Coordination Gross Motor Movements are Fluid and Coordinated: No Fine Motor Movements are Fluid and Coordinated:  No Coordination and Movement Description: Limited by hemiplegia and apraxia Heel Shin Test: decreased bil R>L,  9 Hole Peg Test: Decreased accuracy and graded movements Motor  Motor Motor: Hemiplegia;Motor apraxia;Abnormal tone;Motor perseverations Motor - Skilled Clinical Observations: R hemipelegia with motor apraxia Motor - Discharge Observations: Increased motor activation since eval, but some focal deficits remain. Also with residual apraxia  Mobility Bed Mobility Bed Mobility: Supine to Sit;Sit to Supine Supine to Sit: 6: Modified independent (Device/Increase time);HOB flat;Other (comment) Sitting - Scoot to Edge of Bed: 6: Modified independent (Device/Increase time) Sit to Supine: 6: Modified independent (Device/Increase time);HOB flat;Other (comment) Transfers Sit to Stand: 6: Modified independent (Device/Increase time) Stand to Sit: 6: Modified independent (Device/Increase time) Stand Pivot Transfers: 6: Modified independent (Device/Increase time) Stand Pivot Transfer Details (indicate cue type and reason): Mod I without deive, but pt needs safety cues when using RW with transfers.  Locomotion  Ambulation Ambulation: Yes Ambulation/Gait Assistance: 5: Supervision Ambulation Distance (Feet): 200 Feet Assistive device: Rolling walker Ambulation/Gait Assistance Details: Verbal cues for precautions/safety Stairs / Additional Locomotion Stairs: Yes Stairs Assistance: 5: Supervision Stairs Assistance Details: Verbal cues for precautions/safety;Verbal cues for technique;Verbal cues for sequencing Stair Management Technique: Two rails;Alternating pattern;Forwards Number of Stairs: 15 Height of Stairs: 4 Wheelchair Mobility Wheelchair Mobility: No  Trunk/Postural Assessment  Cervical Assessment Cervical Assessment: Within Functional Limits Thoracic Assessment Thoracic Assessment: Within Functional Limits Lumbar Assessment Lumbar Assessment: Within Functional Limits Postural  Control Postural Control: Within Functional Limits  Balance Static Sitting Balance Static Sitting - Balance Support: No upper extremity supported;Feet supported Static Sitting - Level of Assistance: 7: Independent Dynamic Sitting Balance Dynamic Sitting - Balance Support: No upper extremity supported;Feet supported Dynamic Sitting - Level of Assistance: 6: Modified independent (Device/Increase time) Dynamic Sitting - Balance Activities: Lateral lean/weight shifting;Forward lean/weight shifting;Reaching for objects;Reaching for weighted objects;Reaching across midline Static Standing Balance Static Standing - Balance Support: No upper  extremity supported Static Standing - Level of Assistance: 5: Stand by assistance Static Standing - Comment/# of Minutes: Pt able to maintain standing x68min unsupprted Dynamic Standing Balance Dynamic Standing - Balance Support: Right upper extremity supported;Left upper extremity supported Dynamic Standing - Level of Assistance: 5: Stand by assistance Dynamic Standing - Balance Activities: Lateral lean/weight shifting;Forward lean/weight shifting;Reaching for objects;Reaching across midline Dynamic Standing - Comments: Maintains midline overall but needs cues for R UE placement and release for support Extremity Assessment  RUE Assessment RUE Assessment: Exceptions to [facility] RUE Strength RUE Overall Strength Comments: inconsistent grossly 2/5 shoulder and 4/5 grasp - decreased ability to release grasp LUE Assessment LUE Assessment: Within Functional Limits RLE Assessment RLE Assessment: Exceptions to [facility] RLE Strength RLE Overall Strength: Deficits LLE Assessment LLE Assessment: Within Functional Limits  See FIM for current functional status  Pt's spouse present for therapy for family ed and was educated on all safety techniques for gait, transfers, balance, stairs, and floor transfers. Wife was able to return demonstrate close S in controlled environment  and in room mobility with RW and was advised to stay close to pt upon return home.  Pt was able to ascend/descend 15 stairs with bil rails and cues for sequence, but unable to step with verbal sequence commands, needing cues to fully place R step before continuing. Demonstrated floor transfer technique, which pt was able to return demo easily. Gait training 2x150' with RW in controlled setting with S and cues to attend to R side.   Clydene Laming, PT, DPT  02/25/2013, 11:29 AM

## 2013-02-25 NOTE — Progress Notes (Signed)
Occupational Therapy Discharge Summary  Patient Details  Name: [name] MRN: [id number] Date of Birth: Apr 30, 1956  Today's Date: 02/25/2013  Patient has met 11 of 12 long term goals due to improved activity tolerance, improved balance, postural control, ability to compensate for deficits, functional use of  RIGHT upper and RIGHT lower extremity, improved attention, improved awareness and improved coordination.  Patient to discharge at overall Supervision level.  Patient's care partner is independent to provide the necessary physical and cognitive assistance at discharge.    Reasons goals not met: Pt requires supervision/cues for grooming with cues to incorporate RUE and for supervision in standing.  Recommendation:  Patient will benefit from ongoing skilled OT services in outpatient setting to continue to advance functional skills in the area of BADL, iADL and Reduce care partner burden.  Equipment: tub bench  Reasons for discharge: treatment goals met and discharge from hospital  Patient/family agrees with progress made and goals achieved: Yes  OT Discharge Precautions/Restrictions  Precautions Precautions: Fall Precaution Comments: Pt impulsive with poor awareness of deficits; Rt. inattention Restrictions Weight Bearing Restrictions: No Vital Signs Therapy Vitals Temp: 98.1 F (36.7 C) Temp src: Oral Pulse Rate: 78 Resp: 18 BP: 121/84 mmHg Patient Position, if appropriate: Lying Oxygen Therapy SpO2: 100 % O2 Device: None (Room air) Pain Pain Assessment Pain Assessment: No/denies pain ADL ADL Eating: Modified independent Where Assessed-Eating: Chair Grooming: Supervision/safety Where Assessed-Grooming: Standing at sink Upper Body Bathing: Supervision/safety Where Assessed-Upper Body Bathing: Shower Lower Body Bathing: Supervision/safety Where Assessed-Lower Body Bathing: Shower Upper Body Dressing: Supervision/safety Where Assessed-Upper Body Dressing: Edge  of bed Lower Body Dressing: Supervision/safety Where Assessed-Lower Body Dressing: Edge of bed Toileting: Supervision/safety Where Assessed-Toileting: Teacher, adult education: Close supervision Toilet Transfer Method: Insurance claims handler: Close supervison Web designer Method: Occupational hygienist: Insurance underwriter: Insurance underwriter Method: Designer, industrial/product: Information systems manager with back;Grab bars Vision/Perception  Vision - History Baseline Vision: No visual deficits Patient Visual Report: No change from baseline Vision - Assessment Eye Alignment: Within Functional Limits Perception Perception: Impaired Inattention/Neglect: Does not attend to right side of body Praxis Praxis: Impaired Praxis Impairment Details: Motor planning;Perseveration  Cognition Arousal/Alertness: Awake/alert Orientation Level: Oriented X4 Awareness: Impaired Problem Solving: Impaired Safety/Judgment: Impaired Sensation Sensation Light Touch: Impaired Detail Light Touch Impaired Details: Impaired RLE;Impaired LUE Stereognosis: Not tested Hot/Cold: Not tested Proprioception: Impaired by gross assessment Additional Comments: Pt reports decreased light touch in L LE and UE, discussed safety implications Coordination Gross Motor Movements are Fluid and Coordinated: No Fine Motor Movements are Fluid and Coordinated: No Coordination and Movement Description: 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  Also with residual apraxia Mobility  Bed Mobility Bed Mobility: Supine to Sit;Sit to Supine Supine to Sit: 6: Modified independent (Device/Increase time);HOB flat;Other (comment) Sitting - Scoot to Edge of Bed: 6: Modified independent (Device/Increase time) Sit to Supine: 6: Modified independent (Device/Increase time);HOB flat;Other (comment) Transfers Sit to Stand: 6: Modified independent (Device/Increase time) Stand to Sit: 6: Modified independent (Device/Increase time)  Trunk/Postural Assessment  Cervical Assessment Cervical Assessment: Within Functional Limits Thoracic Assessment Thoracic Assessment: Within Functional Limits Lumbar Assessment  Lumbar Assessment: Within Functional Limits Postural Control Postural Control: Within Functional Limits  Balance Static Sitting Balance Static Sitting - Balance Support: No upper extremity supported;Feet supported Static Sitting - Level of Assistance: 7: Independent Dynamic Sitting Balance Dynamic Sitting - Balance Support: No upper extremity supported;Feet supported Dynamic Sitting - Level of Assistance: 6: Modified independent (Device/Increase time) Dynamic Sitting - Balance Activities: Lateral lean/weight shifting;Forward lean/weight shifting;Reaching for objects;Reaching for weighted objects;Reaching across midline Static Standing Balance Static Standing - Balance Support: No upper extremity supported Static Standing - Level of Assistance: 5: Stand by assistance Static Standing - Comment/# of Minutes: Pt able to maintain standing x67min unsupprted Dynamic Standing Balance Dynamic Standing - Balance Support: Right upper extremity supported;Left upper extremity supported Dynamic Standing - Level of Assistance: 5: Stand by assistance Dynamic Standing - Balance Activities: Lateral lean/weight shifting;Forward lean/weight shifting;Reaching for objects;Reaching across midline Dynamic Standing - Comments: Maintains midline overall but needs  cues for R UE placement and release for support Extremity/Trunk Assessment RUE Assessment RUE Assessment: Exceptions to [facility] RUE AROM (degrees) RUE Overall AROM Comments: ROM WFL, requires increased time for motor planning and control RUE Strength RUE Overall Strength Comments: strength grossly 4/5 LUE Assessment LUE Assessment: Within Functional Limits  See FIM for current functional status  Leonette Monarch 02/25/2013, 12:21 PM

## 2013-02-25 NOTE — Progress Notes (Signed)
Patient ID: [name], male   DOB: 1956-02-18, 57 y.o.   MRN: [id number] Subjective/Complaints: Excited about D/C, wife coming in for training A 12 point review of systems has been performed and if not noted above is otherwise negative.   Objective: Vital Signs: Blood pressure 121/84, pulse 78, temperature 98.1 F (36.7 C), temperature source Oral, resp. rate 18, height 5\' 9"  (1.753 m), weight 80 kg (176 lb 5.9 oz), SpO2 100.00%. No results found. No results found for this basename: WBC, HGB, HCT, PLT,  in the last 72 hours  Recent Labs  02/23/13 0545  CREATININE 1.07   CBG (last 3)  No results found for this basename: GLUCAP,  in the last 72 hours  Wt Readings from Last 3 Encounters:  02/17/13 80 kg (176 lb 5.9 oz)  02/03/13 83.915 kg (185 lb)  02/03/13 83.915 kg (185 lb)    Physical Exam:  HENT:  Head: Normocephalic.  Eyes: EOM are normal.  Neck: Neck supple. No thyromegaly present.  Cardiovascular: Normal rate and regular rhythm.  Pulmonary/Chest: Effort normal and breath sounds normal. No respiratory distress.  Abdominal: Soft. Bowel sounds are normal. He exhibits no distension.  Neurological: He is alert.  Patient with word finding deficits, verbal apraxia. He was able to state his name   He did follow simple commands. RUE is tr to 3+/5 and inconsistent. RLE is trace to 2-at HF and KE and 0/5 distally with inconsistency as well and likely apraxia. Decreased sensation to light touch but could grossly sense pain. Persistent grasp right hand Skin: Skin is warm and dry.  Psychiatric:   affect appropriate   Assessment/Plan: 1. 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 Stable for D/C today F/u PCP in 1-2 weeks F/u PM&R 3 weeks See D/C summary See D/C instructions  FIM: FIM - Bathing Bathing Steps Patient Completed: Chest;Right  upper leg;Right Arm;Left Arm;Right lower leg (including foot);Left upper leg;Left lower leg (including foot);Abdomen;Buttocks;Front perineal area Bathing: 5: Supervision: Safety issues/verbal cues  FIM - Upper Body Dressing/Undressing Upper body dressing/undressing steps patient completed: Thread/unthread right sleeve of pullover shirt/dresss;Thread/unthread left sleeve of pullover shirt/dress;Put head through opening of pull over shirt/dress;Pull shirt over trunk Upper body dressing/undressing: 5: Set-up assist to: Obtain clothing/put away FIM - Lower Body Dressing/Undressing Lower body dressing/undressing steps patient completed: Thread/unthread right pants leg;Pull underwear up/down;Thread/unthread left underwear leg;Thread/unthread right underwear leg;Thread/unthread left pants leg;Pull pants up/down;Don/Doff right sock;Don/Doff left sock;Don/Doff right shoe;Don/Doff left shoe;Fasten/unfasten right shoe;Fasten/unfasten left shoe Lower body dressing/undressing: 5: Set-up assist to: Obtain clothing  FIM - Toileting Toileting steps completed by patient: Adjust clothing prior to toileting;Performs perineal hygiene;Adjust clothing after toileting Toileting Assistive Devices: Grab bar or rail for support Toileting: 0: Activity did not occur  FIM - Diplomatic Services operational officer Devices: Grab bars Toilet Transfers: 5-To toilet/BSC: Supervision (verbal cues/safety issues);5-From toilet/BSC: Supervision (verbal cues/safety issues)  FIM - Banker Devices: Therapist, occupational: 6: Supine > Sit: No assist;6: Sit > Supine: No assist;5: Bed > Chair or W/C: Supervision (verbal cues/safety issues);5: Chair or W/C > Bed: Supervision (verbal cues/safety issues)  FIM - Locomotion: Wheelchair Distance: 200 Locomotion: Wheelchair: 5: Travels 150 ft or more: maneuvers on rugs and over door sills with supervision, cueing or coaxing FIM - Locomotion:  Ambulation Locomotion: Ambulation Assistive Devices: Designer, industrial/product Ambulation/Gait Assistance: 5: Supervision Locomotion: Ambulation: 5: Travels 150 ft or  more with supervision/safety issues  Comprehension Comprehension Mode: Auditory Comprehension: 5-Follows basic conversation/direction: With extra time/assistive device  Expression Expression Mode: Verbal Expression: 5-Expresses complex 90% of the time/cues < 10% of the time  Social Interaction Social Interaction: 7-Interacts appropriately with others - No medications needed.  Problem Solving Problem Solving: 5-Solves basic 90% of the time/requires cueing < 10% of the time  Memory Memory: 5-Recognizes or recalls 90% of the time/requires cueing < 10% of the time  Medical Problem List and Plan:  1. 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 DVT Prophylaxis/Anticoagulation: Subcutaneous Lovenox. Monitor platelet counts any signs of bleeding  3. Neuropsych: This patient is not capable of making decisions on his/her own behalf.   -neuropsych assessment for mood 4. Hypertension. . Patient on hydrochlorothiazide 25 mg daily prior to admission. On low dose ACE to start/ don't want to lower pressure too much--continue with current regimen 5. Tobacco/marijuana abuse. Continue NicoDerm patch and provide counseling  6. Hyperlipidemia. Lipitor   LOS (Days) 16 A FACE TO FACE EVALUATION WAS PERFORMED  Erick Colace 02/25/2013 7:19 AM

## 2013-02-25 NOTE — Discharge Summary (Signed)
  Discharge summary job [id number]

## 2013-02-25 NOTE — Progress Notes (Signed)
Speech Language Pathology Daily Session Note & Discharge Summary   Patient Details  Name: [name] MRN: [id number] Date of Birth: May 06, 1956  Today's Date: 02/25/2013 Time: 1300-1315 Time Calculation (min): 15 min  Short Term Goals: Week 2: SLP Short Term Goal 1 (Week 2): Pt will utilize call bell to request assistance with Min level verbal and question cues.  SLP Short Term Goal 2 (Week 2): Pt will utilize external memory aids to increase recall/carryover of newly learned informaiton with Min A verbal and question cues.  SLP Short Term Goal 3 (Week 2): Pt will demonstrate functional problem solving with basic and familair tasks with Supervisoin level verbal and question cues.  SLP Short Term Goal 4 (Week 2): Pt will self-monitor and correct verbal errors at the sentence level with Min A verbal cues. SLP Short Term Goal 5 (Week 2): Pt will utilize word-finding strategies with Min A verbal and question cues.   Skilled Therapeutic Interventions: Skilled treatment session focused on education addressing cognitive-linguistic goals.  Patient and wife present for session.  Patient visibly upset, when SLP questioned what was wrong patient stated that he and his wife had been waiting for me and wanted to go home.  SLP arrived for appointment as scheduled.  SLP informed them this was for education prior to discharge and to answer questions wife may have before taking patient home.  SLP educated them regarding diagnosis of aphasia and language impairment impacting expressive and receptive language and writing abilities.  SLP provided with handouts for word finding strategies as well as memor y strategies.  Wife verbalized understanding and patient assist by labeling 2/3 word finding strategies.  Wife reported not having any questions and as a result session was ended early per their request.     FIM:  Comprehension Comprehension Mode: Auditory Comprehension: 5-Understands basic 90% of the  time/requires cueing < 10% of the time Expression Expression Mode: Verbal Expression: 5-Expresses basic 90% of the time/requires cueing < 10% of the time. Social Interaction Social Interaction: 3-Interacts appropriately 50 - 74% of the time - May be physically or verbally inappropriate. Problem Solving Problem Solving: 5-Solves basic 90% of the time/requires cueing < 10% of the time Memory Memory: 4-Recognizes or recalls 75 - 89% of the time/requires cueing 10 - 24% of the time FIM - Eating Eating Activity: 7: Complete independence:no helper  Pain Pain Assessment Pain Assessment: No/denies pain  Therapy/Group: Individual Therapy   Speech Language Pathology Discharge Summary  Patient Details  Name: [name] MRN: [id number] Date of Birth: [birth year]  Today's Date: 02/25/2013  Patient has met 4 of 4 long term goals.  Patient to discharge at overall Supervision;Min level.  Reasons goals not met: n/a   Clinical Impression/Discharge Summary: Patient met 4 out of 4 long term goals this reporting period due to gains from Mod-Max assist to Supervision-Min assist.  Patient made gains in functional problem solving, expressive and receptive language abilities, awareness and recall.  Patient continues to demonstrate impairments which are impacted by his impulsivity and decreased self-monitoring and correcting.  Patient becomes frustrated and often times he appears to not be open to clinician cues for word finding and dysfluencies.  Education with wife was brief, but complete and it is recommended that patient continue to receive skilled SLP services in an outpatient setting to address these deficits, maximize functional independence and reduce burden f care.    Care Partner:  Caregiver Able to Provide Assistance: Yes  Type of Caregiver Assistance: Physical;Cognitive (Linguistic)  Recommendation:  Outpatient SLP;24 hour supervision/assistance  Rationale for SLP Follow Up: Maximize  functional communication;Maximize cognitive function and independence;Reduce caregiver burden   Equipment: none   Reasons for discharge: Treatment goals met;Discharged from hospital   Patient/Family Agrees with Progress Made and Goals Achieved: Yes   See FIM for current functional status  Charlane Ferretti., CCC-SLP [phone]  [name] 02/25/2013, 1:27 PM

## 2013-02-25 NOTE — Progress Notes (Signed)
Patient ID: [name], male   DOB: 1955-11-11, 57 y.o.   MRN: [id number] Subjective/Complaints:  Met goals wife to come in for training A 12 point review of systems has been performed and if not noted above is otherwise negative.   Objective: Vital Signs: Blood pressure 121/84, pulse 78, temperature 98.1 F (36.7 C), temperature source Oral, resp. rate 18, height 5\' 9"  (1.753 m), weight 80 kg (176 lb 5.9 oz), SpO2 100.00%. No results found. No results found for this basename: WBC, HGB, HCT, PLT,  in the last 72 hours  Recent Labs  02/23/13 0545  CREATININE 1.07   CBG (last 3)  No results found for this basename: GLUCAP,  in the last 72 hours  Wt Readings from Last 3 Encounters:  02/17/13 80 kg (176 lb 5.9 oz)  02/03/13 83.915 kg (185 lb)  02/03/13 83.915 kg (185 lb)    Physical Exam:  HENT:  Head: Normocephalic.  Eyes: EOM are normal.  Neck: Neck supple. No thyromegaly present.  Cardiovascular: Normal rate and regular rhythm.  Pulmonary/Chest: Effort normal and breath sounds normal. No respiratory distress.  Abdominal: Soft. Bowel sounds are normal. He exhibits no distension.  Neurological: He is alert.  Patient with word finding deficits, verbal apraxia. He was able to state his name   He did follow simple commands. RUE is tr to 3+/5 and inconsistent. RLE is trace to 2-at HF and KE and 0/5 distally with inconsistency as well and likely apraxia. Decreased sensation to light touch but could grossly sense pain. Persistent grasp right hand Skin: Skin is warm and dry.  Psychiatric:   affect appropriate   Assessment/Plan: 1. 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  Stable for D/C today F/u PCP in 1-2 weeks F/u PM&R 3 weeks See D/C summary See D/C instructions  FIM: FIM - Bathing Bathing Steps Patient Completed: Chest;Right upper  leg;Right Arm;Left Arm;Right lower leg (including foot);Left upper leg;Left lower leg (including foot);Abdomen;Buttocks;Front perineal area Bathing: 5: Supervision: Safety issues/verbal cues  FIM - Upper Body Dressing/Undressing Upper body dressing/undressing steps patient completed: Thread/unthread right sleeve of pullover shirt/dresss;Thread/unthread left sleeve of pullover shirt/dress;Put head through opening of pull over shirt/dress;Pull shirt over trunk Upper body dressing/undressing: 5: Set-up assist to: Obtain clothing/put away FIM - Lower Body Dressing/Undressing Lower body dressing/undressing steps patient completed: Thread/unthread right pants leg;Pull underwear up/down;Thread/unthread left underwear leg;Thread/unthread right underwear leg;Thread/unthread left pants leg;Pull pants up/down;Don/Doff right sock;Don/Doff left sock;Don/Doff right shoe;Don/Doff left shoe;Fasten/unfasten right shoe;Fasten/unfasten left shoe Lower body dressing/undressing: 5: Set-up assist to: Obtain clothing  FIM - Toileting Toileting steps completed by patient: Adjust clothing prior to toileting;Performs perineal hygiene;Adjust clothing after toileting Toileting Assistive Devices: Grab bar or rail for support Toileting: 0: Activity did not occur  FIM - Diplomatic Services operational officer Devices: Grab bars Toilet Transfers: 5-To toilet/BSC: Supervision (verbal cues/safety issues);5-From toilet/BSC: Supervision (verbal cues/safety issues)  FIM - Banker Devices: Therapist, occupational: 6: Supine > Sit: No assist;6: Sit > Supine: No assist;5: Bed > Chair or W/C: Supervision (verbal cues/safety issues);5: Chair or W/C > Bed: Supervision (verbal cues/safety issues)  FIM - Locomotion: Wheelchair Distance: 200 Locomotion: Wheelchair: 5: Travels 150 ft or more: maneuvers on rugs and over door sills with supervision, cueing or coaxing FIM - Locomotion:  Ambulation Locomotion: Ambulation Assistive Devices: Designer, industrial/product Ambulation/Gait Assistance: 5: Supervision Locomotion: Ambulation: 5: Travels 150  ft or more with supervision/safety issues  Comprehension Comprehension Mode: Auditory Comprehension: 5-Follows basic conversation/direction: With extra time/assistive device  Expression Expression Mode: Verbal Expression: 5-Expresses complex 90% of the time/cues < 10% of the time  Social Interaction Social Interaction: 7-Interacts appropriately with others - No medications needed.  Problem Solving Problem Solving: 5-Solves basic 90% of the time/requires cueing < 10% of the time  Memory Memory: 5-Recognizes or recalls 90% of the time/requires cueing < 10% of the time  Medical Problem List and Plan:  1. 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 DVT Prophylaxis/Anticoagulation: Subcutaneous Lovenox. Monitor platelet counts any signs of bleeding  3. Neuropsych: This patient is not capable of making decisions on his/her own behalf.   -neuropsych assessment for mood 4. Hypertension. . Patient on hydrochlorothiazide 25 mg daily prior to admission. On low dose ACE to start/ don't want to lower pressure too much--continue with current regimen 5. Tobacco/marijuana abuse. Continue NicoDerm patch and provide counseling  6. Hyperlipidemia. Lipitor   LOS (Days) 16 A FACE TO FACE EVALUATION WAS PERFORMED  Erick Colace 02/25/2013 8:26 AM

## 2013-02-25 NOTE — Progress Notes (Signed)
Occupational Therapy Session Note  Patient Details  Name: [name] MRN: [id number] Date of Birth: 01/24/1956  Today's Date: 02/25/2013 Time: 0930-1025 Time Calculation (min): 55 min  Short Term Goals: Week 2:  OT Short Term Goal 1 (Week 2): Pt will complete toilet transfer with min assist stand pivot OT Short Term Goal 2 (Week 2): Pt will complete shower transfer with min assist OT Short Term Goal 3 (Week 2): Pt will complete LB dressing with min assist OT Short Term Goal 4 (Week 2): Pt will complete 2 grooming tasks in standing with min assist for balance  Skilled Therapeutic Interventions/Progress Updates:    Pt completed ADL retraining with overall supervision at sit <> stand level.  Pt's wife present for hands on family education.  Pt completed bathing in room shower and dressing at EOB.  Pt with increased carryover of hemi-technique and functional use of RUE.  Pt's wife reports pleased with progress he has made.  Pt performed tub/shower transfer with use of tub bench with wife providing close supervision and cues for RLE placement.  Car transfer performed with wife, who again was able to provide the necessary questioning cues.  Pt reports need to fix RLE placement and push up from steady surface and not moving door.  Reiterated necessity of pt taking his time when ambulating and attempt to engage RUE in any and all functional tasks.  Wife and pt report understanding.  Therapy Documentation Precautions:  Precautions Precautions: Fall Precaution Comments: Pt impulsive with poor awareness of deficits; Rt. inattention Restrictions Weight Bearing Restrictions: No Pain: Pain Assessment Pain Assessment: No/denies pain ADL: ADL Eating: Modified independent Where Assessed-Eating: Chair Grooming: Supervision/safety Where Assessed-Grooming: Standing at sink Upper Body Bathing: Supervision/safety Where Assessed-Upper Body Bathing: Shower Lower Body Bathing: Supervision/safety Where  Assessed-Lower Body Bathing: Shower Upper Body Dressing: Supervision/safety Where Assessed-Upper Body Dressing: Edge of bed Lower Body Dressing: Supervision/safety Where Assessed-Lower Body Dressing: Edge of bed Toileting: Supervision/safety Where Assessed-Toileting: Teacher, adult education: Close supervision Statistician Method: Insurance claims handler: Close supervison Web designer Method: Occupational hygienist: Insurance underwriter: Insurance underwriter Method: Designer, industrial/product: Shower seat with back;Grab bars  See FIM for current functional status  Therapy/Group: Individual Therapy  Leonette Monarch 02/25/2013, 12:14 PM

## 2013-02-25 NOTE — Progress Notes (Signed)
Patient discharged to home with wife at 67. Discharge instructions and prescriptions provided by Deatra Ina, PA. Patient and family verbalized understanding. Hedy Camara

## 2013-02-26 NOTE — Discharge Summary (Signed)
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  HISTORY OF PRESENT ILLNESS:  This is a 57 year old right-handed male with history of hypertension, tobacco abuse, who was admitted Feb 03, 2013 with right-sided weakness and aphasia.  MRI of the brain showed acute infarction throughout the left anterior cerebral artery territory. MRA of the head with abrupt occlusion of the left anterior cerebral artery 8 mm beyond its origin.  Urine drug screen positive for marijuana.  The patient did not receive tPA.  EEG consistent with focal neurological dysfunction involving the left cerebral hemisphere, notably the left temporal parietal region.  Echocardiogram with ejection fraction of 65% without embolism.  Carotid Dopplers with no ICA stenosis.  Neurology Service was consulted, placed on aspirin therapy. Subcutaneous Lovenox added for DVT prophylaxis.  TEE on Feb 09, 2013 showed no thrombus without PFO.  Physical and occupational therapy ongoing.  The patient was admitted for comprehensive rehab program.  PAST MEDICAL HISTORY:  See discharge diagnoses.  SOCIAL HISTORY:  Lives with spouse.  Functional history prior to admission independent.  Functional status upon admission to rehab services was +2 total assist to ambulate 2 person handheld assistance.  PHYSICAL EXAMINATION:  VITAL SIGNS:  Blood pressure 159/88, pulse 63, temperature 98.3, respirations 18. GENERAL:  This was an alert male.  He had some word-finding  deficits with verbal apraxia. EYES:  Pupils round and reactive to light. LUNGS:  Clear to auscultation. CARDIAC:  Regular rate and rhythm. ABDOMEN:  Soft, nontender.  Good bowel sounds.  REHABILITATION HOSPITAL COURSE:  The patient was admitted to inpatient rehab services with therapies initiated on a 3-hour daily basis consisting of physical therapy, occupational therapy, speech therapy, and rehabilitation nursing.  The following issues were addressed during the patient's rehabilitation stay.  Pertaining to [name]' left anterior circulation infarct felt to be embolic, he remained on aspirin therapy and would follow up with Neurology Services.  Subcutaneous Lovenox for DVT prophylaxis with no bleeding episodes.  Blood pressures were controlled with lisinopril 10 mg daily.  He would follow up with his primary MD.  There were no orthostatic changes.  He did have a history of tobacco, marijuana use.  He was on a NicoDerm patch, received full counseling in regards to cessation of tobacco as well as any illicit drugs.  It was questionable he would be compliant with this request.  He did remain on Lipitor for hyperlipidemia.  The patient received weekly collaborative interdisciplinary team conferences to discuss estimated length of stay, family teaching, and any barriers to discharge.  He was continent of bowel and bladder on a regular consistency diet, minimum to moderate assist bathing, supervision upper body dressing, moderate assist lower body dressing, moderate assist stand pivot transfers, minimum moderate  assist for basic transfers and ambulation with a rolling walker.  Full family teaching was completed with family and plan was to be discharged to home as his safety awareness and attention did improve.  Again it was stressed the need for 24-hour supervision.  DISCHARGE MEDICATIONS:  At the time of dictation included: 1. Aspirin 325 mg p.o. daily. 2. Lipitor 10 mg p.o.  daily. 3. Lisinopril 10 mg p.o. daily. 4. NicoDerm patch taper as advised.  DIET:  Regular.  FOLLOWUP:  The patient would follow up Dr. Claudette Laws at the outpatient rehab center as directed; Dr. Delia Heady, Neurology Services in 1 month, call for appointment; Dr. Lupe Carney, medical management.     [name], P.A.   ______________________________ Erick Colace, M.D.    DA/MEDQ  D:  02/25/2013  T:  02/26/2013  Job:  [id number]  cc:   Erick Colace, M.D. Pramod P. Pearlean Brownie, MD L. Lupe Carney, M.D.

## 2013-03-08 ENCOUNTER — Ambulatory Visit: Payer: 59 | Attending: Physical Medicine & Rehabilitation | Admitting: Rehabilitative and Restorative Service Providers"

## 2013-03-08 ENCOUNTER — Ambulatory Visit: Payer: 59 | Admitting: Occupational Therapy

## 2013-03-08 DIAGNOSIS — R279 Unspecified lack of coordination: Secondary | ICD-10-CM | POA: Insufficient documentation

## 2013-03-08 DIAGNOSIS — Z5189 Encounter for other specified aftercare: Secondary | ICD-10-CM | POA: Insufficient documentation

## 2013-03-08 DIAGNOSIS — R5381 Other malaise: Secondary | ICD-10-CM | POA: Insufficient documentation

## 2013-03-08 DIAGNOSIS — R269 Unspecified abnormalities of gait and mobility: Secondary | ICD-10-CM | POA: Insufficient documentation

## 2013-03-11 ENCOUNTER — Ambulatory Visit: Payer: 59 | Admitting: Physical Therapy

## 2013-03-11 ENCOUNTER — Ambulatory Visit: Payer: 59 | Admitting: Occupational Therapy

## 2013-03-12 ENCOUNTER — Encounter: Payer: 59 | Attending: Physical Medicine & Rehabilitation

## 2013-03-12 ENCOUNTER — Ambulatory Visit (HOSPITAL_BASED_OUTPATIENT_CLINIC_OR_DEPARTMENT_OTHER): Payer: 59 | Admitting: Physical Medicine & Rehabilitation

## 2013-03-12 ENCOUNTER — Encounter: Payer: Self-pay | Admitting: Physical Medicine & Rehabilitation

## 2013-03-12 VITALS — BP 129/83 | HR 84 | Resp 16 | Ht 69.0 in | Wt 185.0 lb

## 2013-03-12 DIAGNOSIS — I69959 Hemiplegia and hemiparesis following unspecified cerebrovascular disease affecting unspecified side: Secondary | ICD-10-CM | POA: Insufficient documentation

## 2013-03-12 DIAGNOSIS — F17200 Nicotine dependence, unspecified, uncomplicated: Secondary | ICD-10-CM | POA: Insufficient documentation

## 2013-03-12 DIAGNOSIS — G8110 Spastic hemiplegia affecting unspecified side: Secondary | ICD-10-CM | POA: Insufficient documentation

## 2013-03-12 DIAGNOSIS — I6340 Cerebral infarction due to embolism of unspecified cerebral artery: Secondary | ICD-10-CM

## 2013-03-12 DIAGNOSIS — IMO0002 Reserved for concepts with insufficient information to code with codable children: Secondary | ICD-10-CM | POA: Insufficient documentation

## 2013-03-12 DIAGNOSIS — R482 Apraxia: Secondary | ICD-10-CM

## 2013-03-12 DIAGNOSIS — I10 Essential (primary) hypertension: Secondary | ICD-10-CM | POA: Insufficient documentation

## 2013-03-12 MED ORDER — DICLOFENAC SODIUM 1 % TD GEL: 2.0000 g | Freq: Four times a day (QID) | TRANSDERMAL | Status: DC

## 2013-03-12 NOTE — Progress Notes (Signed)
Subjective:    Patient ID: [name], male    DOB: 1956-05-18, 57 y.o.   MRN: [id number] CC:  R side weak UE=LE R elbow pain HPI This is a 57 year old right-handed male  with history of hypertension, tobacco abuse, who was admitted Feb 03, 2013 with right-sided weakness and aphasia. MRI of the brain showed  acute infarction throughout the left anterior cerebral artery territory.  MRA of the head with abrupt occlusion of the left anterior cerebral  artery 8 mm beyond its origin. Urine drug screen positive for  marijuana. The patient did not receive tPA. EEG consistent with focal  neurological dysfunction involving the left cerebral hemisphere, notably  the left temporal parietal region. Echocardiogram with ejection  fraction of 65% without embolism. Carotid Dopplers with no ICA  stenosis.  Going to neuro rehab just started last Monday No falls at home Indep with ADLs Pain Inventory Average Pain 5 Pain Right Now 5 My pain is dull  In the last 24 hours, has pain interfered with the following? General activity 5 Relation with others 5 Enjoyment of life 5 What TIME of day is your pain at its worst? daytime Sleep (in general) NA  Pain is worse with: walking and inactivity Pain improves with: therapy/exercise Relief from Meds: 5  Mobility use a cane how many minutes can you walk? 2 ability to climb steps?  yes do you drive?  no Do you have any goals in this area?  no  Function not employed: date last employed na I need assistance with the following:  meal prep and household duties  Neuro/Psych trouble walking loss of taste or smell  Prior Studies Any changes since last visit?  no  Physicians involved in your care Any changes since last visit?  no   History reviewed. No pertinent family history. History   Social History  . Marital Status: Married    Spouse Name: N/A    Number of Children: N/A  . Years of Education: N/A   Social History Main Topics  .  Smoking status: Current Every Day Smoker -- 0.25 packs/day for 37 years    Types: Cigarettes  . Smokeless tobacco: None  . Alcohol Use: 10.2 oz/week    17 Shots of liquor per week     Comment: 02/03/2013 "about 1/5th of grey goose/wk"  . Drug Use: No  . Sexually Active: None   Other Topics Concern  . None   Social History Narrative  . None   Past Surgical History  Procedure Laterality Date  . No past surgeries    . Tee without cardioversion N/A 02/09/2013    Procedure: TRANSESOPHAGEAL ECHOCARDIOGRAM (TEE);  Surgeon: Lewayne Bunting, MD;  Location: [facility] ENDOSCOPY;  Service: Cardiovascular;  Laterality: N/A;   Past Medical History  Diagnosis Date  . Hypertension   . Anginal pain   . Heart murmur   . Stroke 02/01/2013    left frontal CVA and symptoms of right arm and leg weakness and expressive aphasia/notes 02/03/2013 (02/03/2013)  . Psoriasis     "back and stomach" (02/03/2013)   BP 129/83  Pulse 84  Resp 16  Ht 5\' 9"  (1.753 m)  Wt 185 lb (83.915 kg)  BMI 27.31 kg/m2  SpO2 96%      Review of Systems  Constitutional: Positive for appetite change and unexpected weight change.  Respiratory: Positive for wheezing.   Gastrointestinal: Positive for constipation.  Musculoskeletal: Positive for gait problem.  All other systems reviewed and are negative.  Objective:   Physical Exam  Nursing note and vitals reviewed. Constitutional: He is oriented to person, place, and time. He appears well-developed and well-nourished.  HENT:  Head: Normocephalic and atraumatic.  Eyes: Conjunctivae and EOM are normal. Pupils are equal, round, and reactive to light.  Neurological: He is alert and oriented to person, place, and time. He displays no tremor. A sensory deficit is present. He displays a negative Romberg sign. He displays no seizure activity.  Reflex Scores:      Tricep reflexes are 3+ on the right side and 2+ on the left side.      Bicep reflexes are 3+ on the right side  and 2+ on the left side.      Brachioradialis reflexes are 3+ on the right side and 2+ on the left side.      Patellar reflexes are 3+ on the right side and 2+ on the left side.      Achilles reflexes are 3+ on the right side and 2+ on the left side. 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          Assessment & Plan:  1. Left PCA infarct. He has residual right sided weakness but has good mobility. Still exhibits evidence of apraxia. Recommend continue outpatient therapy. Recommend primary care followup for treatment of hypertension. Blood pressure appears to be under good control. Recommend tobacco cessation RTC one month

## 2013-03-12 NOTE — Patient Instructions (Signed)
Use voltaren gel 4 times a day for R elbow tendinitis  Make appt with Dr Clovis [name]

## 2013-03-16 ENCOUNTER — Ambulatory Visit: Payer: 59 | Admitting: Physical Therapy

## 2013-03-18 ENCOUNTER — Ambulatory Visit: Payer: 59 | Attending: Physical Medicine & Rehabilitation | Admitting: Physical Therapy

## 2013-03-18 DIAGNOSIS — R279 Unspecified lack of coordination: Secondary | ICD-10-CM | POA: Insufficient documentation

## 2013-03-18 DIAGNOSIS — R4701 Aphasia: Secondary | ICD-10-CM | POA: Insufficient documentation

## 2013-03-18 DIAGNOSIS — R5381 Other malaise: Secondary | ICD-10-CM | POA: Insufficient documentation

## 2013-03-18 DIAGNOSIS — I69959 Hemiplegia and hemiparesis following unspecified cerebrovascular disease affecting unspecified side: Secondary | ICD-10-CM | POA: Insufficient documentation

## 2013-03-18 DIAGNOSIS — R269 Unspecified abnormalities of gait and mobility: Secondary | ICD-10-CM | POA: Insufficient documentation

## 2013-03-18 DIAGNOSIS — Z5189 Encounter for other specified aftercare: Secondary | ICD-10-CM | POA: Insufficient documentation

## 2013-03-18 DIAGNOSIS — R488 Other symbolic dysfunctions: Secondary | ICD-10-CM | POA: Insufficient documentation

## 2013-03-22 ENCOUNTER — Ambulatory Visit: Payer: 59 | Admitting: Speech Pathology

## 2013-03-23 ENCOUNTER — Ambulatory Visit: Payer: 59 | Admitting: Occupational Therapy

## 2013-03-23 ENCOUNTER — Encounter: Payer: 59 | Admitting: Occupational Therapy

## 2013-03-23 ENCOUNTER — Ambulatory Visit: Payer: 59 | Admitting: Rehabilitative and Restorative Service Providers"

## 2013-03-25 ENCOUNTER — Ambulatory Visit: Payer: 59 | Admitting: Occupational Therapy

## 2013-03-25 ENCOUNTER — Ambulatory Visit: Payer: 59

## 2013-03-25 ENCOUNTER — Ambulatory Visit: Payer: 59 | Admitting: Rehabilitative and Restorative Service Providers"

## 2013-03-26 ENCOUNTER — Ambulatory Visit: Payer: 59 | Admitting: Rehabilitative and Restorative Service Providers"

## 2013-03-26 ENCOUNTER — Encounter: Payer: 59 | Admitting: Occupational Therapy

## 2013-03-30 ENCOUNTER — Ambulatory Visit: Payer: 59

## 2013-03-30 ENCOUNTER — Ambulatory Visit: Payer: 59 | Admitting: Rehabilitative and Restorative Service Providers"

## 2013-03-30 ENCOUNTER — Ambulatory Visit: Payer: 59 | Admitting: Occupational Therapy

## 2013-04-02 ENCOUNTER — Ambulatory Visit: Payer: 59

## 2013-04-02 ENCOUNTER — Ambulatory Visit: Payer: 59 | Admitting: Physical Therapy

## 2013-04-02 ENCOUNTER — Ambulatory Visit: Payer: 59 | Admitting: Occupational Therapy

## 2013-04-06 ENCOUNTER — Ambulatory Visit: Payer: 59

## 2013-04-06 ENCOUNTER — Ambulatory Visit: Payer: 59 | Admitting: Physical Therapy

## 2013-04-06 ENCOUNTER — Ambulatory Visit: Payer: 59 | Admitting: Occupational Therapy

## 2013-04-09 ENCOUNTER — Ambulatory Visit: Payer: 59 | Admitting: Occupational Therapy

## 2013-04-09 ENCOUNTER — Ambulatory Visit: Payer: 59

## 2013-04-09 ENCOUNTER — Ambulatory Visit: Payer: 59 | Admitting: Rehabilitative and Restorative Service Providers"

## 2013-04-12 ENCOUNTER — Ambulatory Visit: Payer: 59 | Admitting: Occupational Therapy

## 2013-04-12 ENCOUNTER — Ambulatory Visit: Payer: 59 | Admitting: Rehabilitative and Restorative Service Providers"

## 2013-04-12 ENCOUNTER — Ambulatory Visit: Payer: 59

## 2013-04-13 ENCOUNTER — Ambulatory Visit: Payer: 59 | Admitting: Physical Medicine & Rehabilitation

## 2013-04-15 ENCOUNTER — Ambulatory Visit: Payer: 59 | Admitting: Physical Therapy

## 2013-04-15 ENCOUNTER — Ambulatory Visit: Payer: 59 | Admitting: Occupational Therapy

## 2013-04-15 ENCOUNTER — Ambulatory Visit: Payer: 59

## 2013-04-19 ENCOUNTER — Encounter: Payer: 59 | Admitting: Occupational Therapy

## 2013-04-19 ENCOUNTER — Ambulatory Visit: Payer: 59 | Admitting: Physical Therapy

## 2013-04-23 ENCOUNTER — Ambulatory Visit: Payer: 59 | Attending: Physical Medicine & Rehabilitation | Admitting: Physical Therapy

## 2013-04-23 ENCOUNTER — Ambulatory Visit: Payer: 59 | Admitting: Occupational Therapy

## 2013-04-23 DIAGNOSIS — R5381 Other malaise: Secondary | ICD-10-CM | POA: Insufficient documentation

## 2013-04-23 DIAGNOSIS — R488 Other symbolic dysfunctions: Secondary | ICD-10-CM | POA: Insufficient documentation

## 2013-04-23 DIAGNOSIS — R4701 Aphasia: Secondary | ICD-10-CM | POA: Insufficient documentation

## 2013-04-23 DIAGNOSIS — R279 Unspecified lack of coordination: Secondary | ICD-10-CM | POA: Insufficient documentation

## 2013-04-23 DIAGNOSIS — Z5189 Encounter for other specified aftercare: Secondary | ICD-10-CM | POA: Insufficient documentation

## 2013-04-23 DIAGNOSIS — I69959 Hemiplegia and hemiparesis following unspecified cerebrovascular disease affecting unspecified side: Secondary | ICD-10-CM | POA: Insufficient documentation

## 2013-04-23 DIAGNOSIS — R269 Unspecified abnormalities of gait and mobility: Secondary | ICD-10-CM | POA: Insufficient documentation

## 2013-04-30 ENCOUNTER — Ambulatory Visit: Payer: 59

## 2013-05-03 ENCOUNTER — Ambulatory Visit: Payer: 59 | Admitting: Physical Medicine & Rehabilitation

## 2013-05-04 ENCOUNTER — Ambulatory Visit: Payer: 59

## 2013-05-06 ENCOUNTER — Ambulatory Visit: Payer: 59

## 2013-05-10 ENCOUNTER — Ambulatory Visit: Payer: 59

## 2013-05-12 ENCOUNTER — Ambulatory Visit: Payer: 59 | Admitting: Speech Pathology

## 2013-05-14 ENCOUNTER — Ambulatory Visit (HOSPITAL_BASED_OUTPATIENT_CLINIC_OR_DEPARTMENT_OTHER): Payer: 59 | Admitting: Physical Medicine & Rehabilitation

## 2013-05-14 ENCOUNTER — Encounter: Payer: Self-pay | Admitting: Physical Medicine & Rehabilitation

## 2013-05-14 ENCOUNTER — Encounter: Payer: 59 | Attending: Physical Medicine & Rehabilitation

## 2013-05-14 VITALS — BP 143/91 | HR 86 | Resp 14 | Ht 71.0 in | Wt 178.2 lb

## 2013-05-14 DIAGNOSIS — I69920 Aphasia following unspecified cerebrovascular disease: Secondary | ICD-10-CM

## 2013-05-14 DIAGNOSIS — I69959 Hemiplegia and hemiparesis following unspecified cerebrovascular disease affecting unspecified side: Secondary | ICD-10-CM | POA: Insufficient documentation

## 2013-05-14 DIAGNOSIS — IMO0002 Reserved for concepts with insufficient information to code with codable children: Secondary | ICD-10-CM

## 2013-05-14 DIAGNOSIS — G8110 Spastic hemiplegia affecting unspecified side: Secondary | ICD-10-CM

## 2013-05-14 DIAGNOSIS — I10 Essential (primary) hypertension: Secondary | ICD-10-CM | POA: Insufficient documentation

## 2013-05-14 DIAGNOSIS — R482 Apraxia: Secondary | ICD-10-CM

## 2013-05-14 DIAGNOSIS — I69320 Aphasia following cerebral infarction: Secondary | ICD-10-CM | POA: Insufficient documentation

## 2013-05-14 DIAGNOSIS — F17200 Nicotine dependence, unspecified, uncomplicated: Secondary | ICD-10-CM | POA: Insufficient documentation

## 2013-05-14 NOTE — Patient Instructions (Signed)
No driving until optometrist evaluation

## 2013-05-14 NOTE — Progress Notes (Signed)
Subjective:    Patient ID: [name], male    DOB: [birth year], 57 y.o.   MRN: [id number]  HPI CC: R side weak UE=LE  R elbow pain  HPI  This is a 57 year old right-handed male  with history of hypertension, tobacco abuse, who was admitted Feb 03, 2013 with right-sided weakness and aphasia. MRI of the brain showed  acute infarction throughout the left anterior cerebral artery territory.  MRA of the head with abrupt occlusion of the left anterior cerebral  artery 8 mm beyond its origin. Urine drug screen positive for  marijuana. The patient did not receive tPA. EEG consistent with focal  neurological dysfunction involving the left cerebral hemisphere, notably  the left temporal parietal region. Echocardiogram with ejection  fraction of 65% without embolism. Carotid Dopplers with no ICA  stenosis.  Neuro rehabilitation scheduled through September Appointment with neurology scheduled end of September No falls at home Denies any alcohol use, denies any smoking  Pain Inventory Average Pain 7 Pain Right Now 7 My pain is dull  In the last 24 hours, has pain interfered with the following? General activity 7 Relation with others 7 Enjoyment of life 7 What TIME of day is your pain at its worst? morning Sleep (in general) Fair  Pain is worse with: walking and sitting Pain improves with: heat/ice Relief from Meds: none  Mobility walk without assistance ability to climb steps?  yes do you drive?  no  Function not employed: date last employed 06/2012 I need assistance with the following:  meal prep, household duties and shopping  Neuro/Psych weakness confusion depression  Prior Studies Any changes since last visit?  no  Physicians involved in your care Any changes since last visit?  no   History reviewed. No pertinent family history. History   Social History  . Marital Status: Married    Spouse Name: N/A    Number of Children: N/A  . Years of Education: N/A    Social History Main Topics  . Smoking status: Former Smoker -- 0.25 packs/day for 37 years    Types: Cigarettes    Quit date: 04/13/2013  . Smokeless tobacco: None     Comment: using patch to quit  . Alcohol Use: 10.2 oz/week    17 Shots of liquor per week     Comment: 02/03/2013 "about 1/5th of grey goose/wk"  . Drug Use: No  . Sexual Activity: None   Other Topics Concern  . None   Social History Narrative  . None   Past Surgical History  Procedure Laterality Date  . No past surgeries    . Tee without cardioversion N/A 02/09/2013    Procedure: TRANSESOPHAGEAL ECHOCARDIOGRAM (TEE);  Surgeon: Lewayne Bunting, MD;  Location: [facility] ENDOSCOPY;  Service: Cardiovascular;  Laterality: N/A;   Past Medical History  Diagnosis Date  . Hypertension   . Anginal pain   . Heart murmur   . Stroke 02/01/2013    left frontal CVA and symptoms of right arm and leg weakness and expressive aphasia/notes 02/03/2013 (02/03/2013)  . Psoriasis     "back and stomach" (02/03/2013)   BP 143/91  Pulse 86  Resp 14  Ht 5\' 11"  (1.803 m)  Wt 178 lb 3.2 oz (80.831 kg)  BMI 24.86 kg/m2  SpO2 95%    Review of Systems  Neurological: Positive for weakness.  Psychiatric/Behavioral: Positive for confusion and dysphoric mood.  All other systems reviewed and are negative.       Objective:  Physical Exam Nursing note and vitals reviewed.  Constitutional: He is oriented to person, place, and time. He appears well-developed and well-nourished.  HENT:  Head: Normocephalic and atraumatic.  Eyes: Conjunctivae and EOM are normal. Pupils are equal, round, and reactive to light.  Neurological: He is alert and oriented to person, place, and time. He displays no tremor. A sensory deficit is present. He displays a negative Romberg sign. He displays no seizure activity.  Reflex Scores:  Tricep reflexes are 3+ on the right side and 2+ on the left side.  Bicep reflexes are 3+ on the right side and 2+ on the left  side.  Brachioradialis reflexes are 3+ on the right side and 2+ on the left side.  Patellar reflexes are 3+ on the right side and 2+ on the left side.  Achilles reflexes are 3+ on the right side and 2+ on the left side. Hyperesthetic on R side in UE and LE to pp Decreased Sensation to LT on R side  4/5 strength on R side 5/5 on Left  Unable to do Heel to shin apraxia  Psychiatric: He has a normal mood and affect.   Able to repeat no ifs , ands or buts [facility] fields are intact to confrontation testing     Assessment & Plan:  1. Left PCA infarct. He has residual right sided weakness but has good mobility. Still exhibits evidence of apraxia. Recommend continue outpatient therapy. Recommend primary care followup for treatment of hypertension. Blood pressure appears to be under good control. Recommend tobacco cessation   Follows with Dr. Clovis [name] from [place] [facility]'s  primary care  No driving until  Optometrist evaluation  Return to clinic when necessary

## 2013-05-18 ENCOUNTER — Ambulatory Visit: Payer: 59 | Admitting: Occupational Therapy

## 2013-05-18 ENCOUNTER — Ambulatory Visit: Payer: 59 | Attending: Physical Medicine & Rehabilitation | Admitting: Rehabilitative and Restorative Service Providers"

## 2013-05-18 ENCOUNTER — Ambulatory Visit: Payer: 59

## 2013-05-18 DIAGNOSIS — R488 Other symbolic dysfunctions: Secondary | ICD-10-CM | POA: Insufficient documentation

## 2013-05-18 DIAGNOSIS — Z5189 Encounter for other specified aftercare: Secondary | ICD-10-CM | POA: Insufficient documentation

## 2013-05-18 DIAGNOSIS — R269 Unspecified abnormalities of gait and mobility: Secondary | ICD-10-CM | POA: Insufficient documentation

## 2013-05-18 DIAGNOSIS — R4701 Aphasia: Secondary | ICD-10-CM | POA: Insufficient documentation

## 2013-05-18 DIAGNOSIS — R279 Unspecified lack of coordination: Secondary | ICD-10-CM | POA: Insufficient documentation

## 2013-05-18 DIAGNOSIS — R5381 Other malaise: Secondary | ICD-10-CM | POA: Insufficient documentation

## 2013-05-18 DIAGNOSIS — I69959 Hemiplegia and hemiparesis following unspecified cerebrovascular disease affecting unspecified side: Secondary | ICD-10-CM | POA: Insufficient documentation

## 2013-05-19 ENCOUNTER — Ambulatory Visit: Payer: 59 | Admitting: Speech Pathology

## 2013-05-19 ENCOUNTER — Ambulatory Visit: Payer: 59 | Admitting: Occupational Therapy

## 2013-05-19 ENCOUNTER — Ambulatory Visit: Payer: 59 | Admitting: Rehabilitative and Restorative Service Providers"

## 2013-05-24 ENCOUNTER — Ambulatory Visit: Payer: 59 | Admitting: Rehabilitative and Restorative Service Providers"

## 2013-05-24 ENCOUNTER — Ambulatory Visit: Payer: 59 | Admitting: Occupational Therapy

## 2013-05-24 ENCOUNTER — Ambulatory Visit: Payer: 59 | Admitting: Speech Pathology

## 2013-05-26 ENCOUNTER — Ambulatory Visit: Payer: 59 | Admitting: Rehabilitative and Restorative Service Providers"

## 2013-05-26 ENCOUNTER — Encounter: Payer: 59 | Admitting: Occupational Therapy

## 2013-06-10 ENCOUNTER — Ambulatory Visit (INDEPENDENT_AMBULATORY_CARE_PROVIDER_SITE_OTHER): Payer: 59 | Admitting: Neurology

## 2013-06-10 ENCOUNTER — Encounter: Payer: Self-pay | Admitting: Neurology

## 2013-06-10 VITALS — BP 170/117 | HR 86 | Temp 98.4°F | Ht 69.0 in | Wt 188.0 lb

## 2013-06-10 DIAGNOSIS — I4891 Unspecified atrial fibrillation: Secondary | ICD-10-CM

## 2013-06-10 DIAGNOSIS — I6350 Cerebral infarction due to unspecified occlusion or stenosis of unspecified cerebral artery: Secondary | ICD-10-CM

## 2013-06-10 DIAGNOSIS — I480 Paroxysmal atrial fibrillation: Secondary | ICD-10-CM

## 2013-06-10 NOTE — Patient Instructions (Addendum)
Continue aspirin for stroke prevention and strict control of hypertension with blood pressure goal below 130/90. I have advised him to see his primary physician for more aggressive blood pressure control. I have complimented him on quitting smoking. Check outpatient cardiac event monitor for paroxysmal atrial fibrillation. Return for followup in 3 months with Larita Fife, NP or call earlier if necessary.

## 2013-06-10 NOTE — Progress Notes (Signed)
[facility] [address] [place]. [place] [address] [phone]       OFFICE FOLLOW-UP NOTE  [name] Date of Birth:  06-02-1956 Medical Record Number:  [id number]   HPI: 57 year male seen for first office f/u visit after hospital admission on 02/01/2013 for stroke.He developed sudden onset right sided weakness along with difficulty expressing himself.He did not presented until 2 days later.MRI showed left anterior cerebral artery infarct and MRA showed abrupt occlusion of left anterior cerebral artery 8 mm from it`s origin. Transthoraxic and Transesophageal echo were normal without cardiac source of embolism and telemetry monitoring showed no arrythmias.LDL cholesterol was elevated at 127 mg %. ESR, ANA and hypercoagulable labs were normal.Carotid dopplers were normal. EEG showed focal slowing in left temporopareital without definite seizures.He state she has finished therapies and is doing well.He has regained his strength but yet has mild speech difficulties.he is independent and back to doing his prior activities. His blood pressure has been up and down and he has being seeing Dr [name] frequently for this.He quit smoking 3 weeks ago.He is tolerating lipiitor and aspirin well without side effects.  ROS:   14 system review of systems is positive for  Eye pain,itching,memory loss,confusion,weakness, slurred,insomnia,depression,not enough sleep,change in apetite  PMH:  Past Medical History  Diagnosis Date  . Hypertension   . Anginal pain   . Heart murmur   . Stroke 02/01/2013    left frontal CVA and symptoms of right arm and leg weakness and expressive aphasia/notes 02/03/2013 (02/03/2013)  . Psoriasis     "back and stomach" (02/03/2013)    Social History:  History   Social History  . Marital Status: Married    Spouse Name: N/A    Number of Children: 2  . Years of Education: 12   Occupational History  . N/A    Social History Main Topics  . Smoking  status: Former Smoker -- 0.25 packs/day for 37 years    Types: Cigarettes    Quit date: 04/13/2013  . Smokeless tobacco: Not on file     Comment: using patch to quit  . Alcohol Use: 10.2 oz/week    17 Shots of liquor per week     Comment: 02/03/2013 "about 1/5th of grey goose/wk"  . Drug Use: No  . Sexual Activity: Yes    Partners: Female   Other Topics Concern  . Not on file   Social History Narrative  . No narrative on file    Medications:   Current Outpatient Prescriptions on File Prior to Visit  Medication Sig Dispense Refill  . aspirin 325 MG tablet Take 1 tablet (325 mg total) by mouth daily.      Marland Kitchen atorvastatin (LIPITOR) 10 MG tablet Take 1 tablet (10 mg total) by mouth daily at 6 PM.  30 tablet  1  . clobetasol cream (TEMOVATE) 0.05 % Apply 1 application topically daily as needed (psoriasis).      Marland Kitchen desonide (DESOWEN) 0.05 % cream Apply 1 application topically daily as needed (psoriasis).      Marland Kitchen diclofenac sodium (VOLTAREN) 1 % GEL Apply 2 g topically 4 (four) times daily.  3 Tube  1  . hydrochlorothiazide (HYDRODIURIL) 25 MG tablet       . lisinopril (PRINIVIL,ZESTRIL) 10 MG tablet Take 1 tablet (10 mg total) by mouth daily.  30 tablet  1   No current facility-administered medications on file prior to visit.    Allergies:   Allergies  Allergen Reactions  .  Shellfish Allergy Itching    Physical Exam General: well developed, well nourished, seated, in no evident distress Head: head normocephalic and atraumatic. Orohparynx benign Neck: supple with no carotid or supraclavicular bruits Cardiovascular: regular rate and rhythm, no murmurs Musculoskeletal: no deformity Skin:  no rash/petichiae Vascular:  Normal pulses all extremities Filed Vitals:   06/10/13 1410  BP: 170/117  Pulse: 86  Temp: 98.4 F (36.9 C)    Neurologic Exam Mental Status: Awake and fully alert. Oriented to place and time. Recent and remote memory intact. Attention span, concentration and  fund of knowledge appropriate. Mood and affect appropriate.  Cranial Nerves: Fundoscopic exam reveals sharp disc margins. Pupils equal, briskly reactive to light. Extraocular movements full without nystagmus. Visual fields full to confrontation. Hearing intact. Facial sensation intact. Face, tongue, palate moves normally and symmetrically.  Motor: Normal bulk and tone. Normal strength in all tested extremity muscles.diminished fine finger movements on right and orbits left over right extremity Sensory.: intact to tough and pinprick and vibratory.  Coordination: Rapid alternating movements normal in all extremities. Finger-to-nose and heel-to-shin performed accurately bilaterally. Gait and Station: Arises from chair without difficulty. Stance is normal. Gait demonstrates normal stride length and balance but drags right leg a bit . Able to heel, toe and tandem walk without difficulty.  Reflexes: 1+ and symmetric. Toes downgoing.   NIHSS  0 Modified Rankin  1   ASSESSMENT:  57 year male with embolic  occlusion of left anterior cerebral artery infarct in May 2014 without identified source.Vascular risk factors of smoking, Hyperlipidimia and Hypertension.   PLAN: Continue aspirin for stroke prevention and strict control of hypertension with blood pressure goal below 130/90. I have advised him to see his primary physician for more aggressive blood pressure control. I have complimented him on quitting smoking. Check outpatient cardiac event monitor for paroxysmal atrial fibrillation. Return for followup in 3 months with [name] Fife, NP or call earlier if necessary.

## 2013-06-15 ENCOUNTER — Telehealth: Payer: Self-pay

## 2013-06-15 NOTE — Telephone Encounter (Signed)
[facility] called to get lipitor refill for patient.  Called [facility] and advised them to have patient contact PCP.

## 2013-09-20 ENCOUNTER — Ambulatory Visit: Payer: 59 | Admitting: Nurse Practitioner

## 2013-09-20 ENCOUNTER — Telehealth: Payer: Self-pay | Admitting: Nurse Practitioner

## 2013-09-20 NOTE — Telephone Encounter (Signed)
Patient was no show for office visit today.

## 2019-08-26 ENCOUNTER — Emergency Department (HOSPITAL_COMMUNITY): Payer: No Typology Code available for payment source

## 2019-08-26 ENCOUNTER — Inpatient Hospital Stay (HOSPITAL_COMMUNITY)
Admission: EM | Admit: 2019-08-26 | Discharge: 2019-09-16 | DRG: 064 | Disposition: A | Discharge status: Skilled Nursing Facility | Payer: No Typology Code available for payment source | Attending: Internal Medicine | Admitting: Internal Medicine

## 2019-08-26 DIAGNOSIS — R4182 Altered mental status, unspecified: Secondary | ICD-10-CM

## 2019-08-26 DIAGNOSIS — H53462 Homonymous bilateral field defects, left side: Secondary | ICD-10-CM | POA: Diagnosis present

## 2019-08-26 DIAGNOSIS — I161 Hypertensive emergency: Secondary | ICD-10-CM | POA: Diagnosis present

## 2019-08-26 DIAGNOSIS — Z7982 Long term (current) use of aspirin: Secondary | ICD-10-CM

## 2019-08-26 DIAGNOSIS — R414 Neurologic neglect syndrome: Secondary | ICD-10-CM | POA: Diagnosis present

## 2019-08-26 DIAGNOSIS — R29708 NIHSS score 8: Secondary | ICD-10-CM | POA: Diagnosis present

## 2019-08-26 DIAGNOSIS — I169 Hypertensive crisis, unspecified: Secondary | ICD-10-CM | POA: Diagnosis not present

## 2019-08-26 DIAGNOSIS — Z20828 Contact with and (suspected) exposure to other viral communicable diseases: Secondary | ICD-10-CM | POA: Diagnosis present

## 2019-08-26 DIAGNOSIS — Z833 Family history of diabetes mellitus: Secondary | ICD-10-CM

## 2019-08-26 DIAGNOSIS — Z6827 Body mass index (BMI) 27.0-27.9, adult: Secondary | ICD-10-CM

## 2019-08-26 DIAGNOSIS — R4689 Other symptoms and signs involving appearance and behavior: Secondary | ICD-10-CM

## 2019-08-26 DIAGNOSIS — E785 Hyperlipidemia, unspecified: Secondary | ICD-10-CM | POA: Diagnosis present

## 2019-08-26 DIAGNOSIS — R1310 Dysphagia, unspecified: Secondary | ICD-10-CM | POA: Diagnosis present

## 2019-08-26 DIAGNOSIS — Z23 Encounter for immunization: Secondary | ICD-10-CM

## 2019-08-26 DIAGNOSIS — Z91013 Allergy to seafood: Secondary | ICD-10-CM

## 2019-08-26 DIAGNOSIS — Y92009 Unspecified place in unspecified non-institutional (private) residence as the place of occurrence of the external cause: Secondary | ICD-10-CM

## 2019-08-26 DIAGNOSIS — D490 Neoplasm of unspecified behavior of digestive system: Secondary | ICD-10-CM | POA: Diagnosis present

## 2019-08-26 DIAGNOSIS — I63331 Cerebral infarction due to thrombosis of right posterior cerebral artery: Principal | ICD-10-CM | POA: Diagnosis present

## 2019-08-26 DIAGNOSIS — R29810 Facial weakness: Secondary | ICD-10-CM | POA: Diagnosis present

## 2019-08-26 DIAGNOSIS — H18419 Arcus senilis, unspecified eye: Secondary | ICD-10-CM | POA: Diagnosis present

## 2019-08-26 DIAGNOSIS — I358 Other nonrheumatic aortic valve disorders: Secondary | ICD-10-CM | POA: Diagnosis present

## 2019-08-26 DIAGNOSIS — I129 Hypertensive chronic kidney disease with stage 1 through stage 4 chronic kidney disease, or unspecified chronic kidney disease: Secondary | ICD-10-CM | POA: Diagnosis present

## 2019-08-26 DIAGNOSIS — D72829 Elevated white blood cell count, unspecified: Secondary | ICD-10-CM | POA: Diagnosis not present

## 2019-08-26 DIAGNOSIS — Z87891 Personal history of nicotine dependence: Secondary | ICD-10-CM

## 2019-08-26 DIAGNOSIS — E870 Hyperosmolality and hypernatremia: Secondary | ICD-10-CM | POA: Diagnosis present

## 2019-08-26 DIAGNOSIS — R410 Disorientation, unspecified: Secondary | ICD-10-CM

## 2019-08-26 DIAGNOSIS — R4781 Slurred speech: Secondary | ICD-10-CM | POA: Diagnosis present

## 2019-08-26 DIAGNOSIS — Y92238 Other place in hospital as the place of occurrence of the external cause: Secondary | ICD-10-CM | POA: Diagnosis present

## 2019-08-26 DIAGNOSIS — W1830XA Fall on same level, unspecified, initial encounter: Secondary | ICD-10-CM | POA: Diagnosis present

## 2019-08-26 DIAGNOSIS — I63531 Cerebral infarction due to unspecified occlusion or stenosis of right posterior cerebral artery: Secondary | ICD-10-CM

## 2019-08-26 DIAGNOSIS — L409 Psoriasis, unspecified: Secondary | ICD-10-CM | POA: Diagnosis present

## 2019-08-26 DIAGNOSIS — G8194 Hemiplegia, unspecified affecting left nondominant side: Secondary | ICD-10-CM | POA: Diagnosis present

## 2019-08-26 DIAGNOSIS — Z751 Person awaiting admission to adequate facility elsewhere: Secondary | ICD-10-CM

## 2019-08-26 DIAGNOSIS — E860 Dehydration: Secondary | ICD-10-CM | POA: Diagnosis present

## 2019-08-26 DIAGNOSIS — Z9181 History of falling: Secondary | ICD-10-CM

## 2019-08-26 DIAGNOSIS — Z720 Tobacco use: Secondary | ICD-10-CM | POA: Diagnosis present

## 2019-08-26 DIAGNOSIS — Z781 Physical restraint status: Secondary | ICD-10-CM

## 2019-08-26 DIAGNOSIS — R748 Abnormal levels of other serum enzymes: Secondary | ICD-10-CM | POA: Diagnosis present

## 2019-08-26 DIAGNOSIS — E876 Hypokalemia: Secondary | ICD-10-CM | POA: Diagnosis not present

## 2019-08-26 DIAGNOSIS — I6930 Unspecified sequelae of cerebral infarction: Secondary | ICD-10-CM

## 2019-08-26 DIAGNOSIS — I10 Essential (primary) hypertension: Secondary | ICD-10-CM | POA: Diagnosis present

## 2019-08-26 DIAGNOSIS — R471 Dysarthria and anarthria: Secondary | ICD-10-CM | POA: Diagnosis present

## 2019-08-26 DIAGNOSIS — N179 Acute kidney failure, unspecified: Secondary | ICD-10-CM | POA: Diagnosis present

## 2019-08-26 DIAGNOSIS — Z79899 Other long term (current) drug therapy: Secondary | ICD-10-CM

## 2019-08-26 DIAGNOSIS — F1410 Cocaine abuse, uncomplicated: Secondary | ICD-10-CM | POA: Diagnosis present

## 2019-08-26 DIAGNOSIS — E663 Overweight: Secondary | ICD-10-CM | POA: Diagnosis present

## 2019-08-26 DIAGNOSIS — N1832 Chronic kidney disease, stage 3b: Secondary | ICD-10-CM | POA: Diagnosis present

## 2019-08-26 DIAGNOSIS — G9341 Metabolic encephalopathy: Secondary | ICD-10-CM | POA: Diagnosis present

## 2019-08-26 LAB — COMPREHENSIVE METABOLIC PANEL
ALT: 25 U/L (ref 0–44)
AST: 16 U/L (ref 15–41)
Albumin: 3.9 g/dL (ref 3.5–5.0)
Alkaline Phosphatase: 140 U/L — ABNORMAL HIGH (ref 38–126)
Anion gap: 11 (ref 5–15)
BUN: 9 mg/dL (ref 8–23)
CO2: 23 mmol/L (ref 22–32)
Calcium: 9.2 mg/dL (ref 8.9–10.3)
Chloride: 106 mmol/L (ref 98–111)
Creatinine, Ser: 1.32 mg/dL — ABNORMAL HIGH (ref 0.61–1.24)
GFR calc Af Amer: >60 mL/min (ref >60)
GFR calc non Af Amer: 57 mL/min — ABNORMAL LOW (ref >60)
Glucose, Bld: 100 mg/dL — ABNORMAL HIGH (ref 70–99)
Potassium: 3.5 mmol/L (ref 3.5–5.1)
Sodium: 140 mmol/L (ref 135–145)
Total Bilirubin: 1.1 mg/dL (ref 0.3–1.2)
Total Protein: 7.4 g/dL (ref 6.5–8.1)

## 2019-08-26 LAB — URINALYSIS, ROUTINE W REFLEX MICROSCOPIC
Bilirubin Urine: NEGATIVE
Glucose, UA: NEGATIVE mg/dL
Hgb urine dipstick: NEGATIVE
Ketones, ur: 20 mg/dL — AB
Leukocytes,Ua: NEGATIVE
Nitrite: NEGATIVE
Protein, ur: NEGATIVE mg/dL
Specific Gravity, Urine: 1.024 (ref 1.005–1.030)
pH: 5 (ref 5.0–8.0)

## 2019-08-26 LAB — LACTIC ACID, PLASMA: Lactic Acid, Venous: 2 mmol/L (ref 0.5–1.9)

## 2019-08-26 LAB — CBC
HCT: 52.3 % — ABNORMAL HIGH (ref 39.0–52.0)
Hemoglobin: 17 g/dL (ref 13.0–17.0)
MCH: 27.5 pg (ref 26.0–34.0)
MCHC: 32.5 g/dL (ref 30.0–36.0)
MCV: 84.5 fL (ref 80.0–100.0)
Platelets: 265 10*3/uL (ref 150–400)
RBC: 6.19 MIL/uL — ABNORMAL HIGH (ref 4.22–5.81)
RDW: 14.5 % (ref 11.5–15.5)
WBC: 8.5 10*3/uL (ref 4.0–10.5)
nRBC: 0 % (ref 0.0–0.2)

## 2019-08-26 LAB — ETHANOL: Alcohol, Ethyl (B): <10 mg/dL (ref <10)

## 2019-08-26 LAB — CBG MONITORING, ED: Glucose-Capillary: 120 mg/dL — ABNORMAL HIGH (ref 70–99)

## 2019-08-26 MED ORDER — CLEVIDIPINE BUTYRATE 0.5 MG/ML IV EMUL: 0.0000 mg/h | INTRAVENOUS | Status: DC

## 2019-08-26 MED ORDER — SODIUM CHLORIDE 0.9 % IV BOLUS
500.0000 mL | Freq: Once | INTRAVENOUS | Status: AC
  Administered 2019-08-26: 500 mL via INTRAVENOUS

## 2019-08-26 MED ORDER — LABETALOL HCL 5 MG/ML IV SOLN: 10.0000 mg | Freq: Once | INTRAVENOUS | Status: DC | PRN

## 2019-08-26 MED ORDER — LABETALOL HCL 5 MG/ML IV SOLN
10.0000 mg | Freq: Once | INTRAVENOUS | Status: AC | PRN
  Administered 2019-08-26: 10 mg via INTRAVENOUS
  Filled 2019-08-26: qty 4

## 2019-08-26 MED ORDER — HALOPERIDOL LACTATE 5 MG/ML IJ SOLN
2.0000 mg | Freq: Once | INTRAMUSCULAR | Status: AC
  Administered 2019-08-26: 2 mg via INTRAVENOUS
  Filled 2019-08-26: qty 1

## 2019-08-26 NOTE — ED Provider Notes (Signed)
[facility] Emergency Department Provider Note MRN:  [id number]  [place] date & time: 08/26/19     Chief Complaint   Altered Mental Status   History of Present Illness   [name] is a 63 y.o. year-old male with a history of stroke, hypertension presenting to the ED with chief complaint of altered mental status.  Patient is altered for the past several hours, reportedly was pacing the house, acting abnormally, fell 3 times, unknown if head trauma, having copious watery stool today.  I was unable to obtain an accurate HPI, PMH, or ROS due to the patient's altered mental status.  Level 5 caveat.  Review of Systems  Positive for fall, diarrhea, altered mental status  Patient's Health History    Past Medical History:  Diagnosis Date  . Anginal pain   . Heart murmur   . Hypertension   . Psoriasis    "back and stomach" (02/03/2013)  . Stroke 02/01/2013   left frontal CVA and symptoms of right arm and leg weakness and expressive aphasia/notes 02/03/2013 (02/03/2013)    Past Surgical History:  Procedure Laterality Date  . NO PAST SURGERIES    . TEE WITHOUT CARDIOVERSION N/A 02/09/2013   Procedure: TRANSESOPHAGEAL ECHOCARDIOGRAM (TEE);  Surgeon: Lelon Perla, MD;  Location: [facility] ENDOSCOPY;  Service: Cardiovascular;  Laterality: N/A;    No family history on file.  Social History   Socioeconomic History  . Marital status: Married    Spouse name: Not on file  . Number of children: 2  . Years of education: 8  . Highest education level: Not on file  Occupational History  . Occupation: N/A  Tobacco Use  . Smoking status: Former Smoker    Packs/day: 0.25    Years: 37.00    Pack years: 9.25    Types: Cigarettes    Quit date: 04/13/2013    Years since quitting: 6.3  . Tobacco comment: using patch to quit  Substance and Sexual Activity  . Alcohol use: Yes    Alcohol/week: 17.0 standard drinks    Types: 17 Shots of liquor per week    Comment:  02/03/2013 "about 1/5th of grey goose/wk"  . Drug use: No  . Sexual activity: Yes    Partners: Female  Other Topics Concern  . Not on file  Social History Narrative  . Not on file   Social Determinants of Health   Financial Resource Strain:   . Difficulty of Paying Living Expenses: Not on file  Food Insecurity:   . Worried About Charity fundraiser in the Last Year: Not on file  . Ran Out of Food in the Last Year: Not on file  Transportation Needs:   . Lack of Transportation (Medical): Not on file  . Lack of Transportation (Non-Medical): Not on file  Physical Activity:   . Days of Exercise per Week: Not on file  . Minutes of Exercise per Session: Not on file  Stress:   . Feeling of Stress : Not on file  Social Connections:   . Frequency of Communication with Friends and Family: Not on file  . Frequency of Social Gatherings with Friends and Family: Not on file  . Attends Religious Services: Not on file  . Active Member of Clubs or Organizations: Not on file  . Attends Archivist Meetings: Not on file  . Marital Status: Not on file  Intimate Partner Violence:   . Fear of Current or Ex-Partner: Not on file  .  Emotionally Abused: Not on file  . Physically Abused: Not on file  . Sexually Abused: Not on file     Physical Exam  Vital Signs and Nursing Notes reviewed Vitals:   08/26/19 2030 08/26/19 2052  BP: (!) 186/112 (!) 231/125  Pulse: 80 91  Temp:    SpO2: 96% 98%    CONSTITUTIONAL: Well-appearing, NAD NEURO: Oriented to name and place but not time, moderately confused, normal and symmetric strength and sensation, normal coordination, slurred speech EYES:  eyes equal and reactive ENT/NECK:  no LAD, no JVD CARDIO: Regular rate, well-perfused, normal S1 and S2 PULM:  CTAB no wheezing or rhonchi GI/GU:  normal bowel sounds, non-distended, non-tender MSK/SPINE:  No gross deformities, no edema SKIN:  no rash, atraumatic PSYCH:  Appropriate speech and  behavior  Diagnostic and Interventional Summary    EKG Interpretation  Date/Time:  Thursday August 26 2019 19:38:08 EST Ventricular Rate:  91 PR Interval:    QRS Duration: 100 QT Interval:  384 QTC Calculation: 473 R Axis:   -73 Text Interpretation: Sinus rhythm Incomplete RBBB and LAFB Consider right ventricular hypertrophy Confirmed by Gerlene Fee [id number]) on 08/26/2019 8:49:11 PM      Labs Reviewed  CBC - Abnormal; Notable for the following components:      Result Value   RBC 6.19 (*)    HCT 52.3 (*)    All other components within normal limits  COMPREHENSIVE METABOLIC PANEL - Abnormal; Notable for the following components:   Glucose, Bld 100 (*)    Creatinine, Ser 1.32 (*)    Alkaline Phosphatase 140 (*)    GFR calc non Af Amer 57 (*)    All other components within normal limits  LACTIC ACID, PLASMA - Abnormal; Notable for the following components:   Lactic Acid, Venous 2.0 (*)    All other components within normal limits  URINALYSIS, ROUTINE W REFLEX MICROSCOPIC - Abnormal; Notable for the following components:   Ketones, ur 20 (*)    All other components within normal limits  CBG MONITORING, ED - Abnormal; Notable for the following components:   Glucose-Capillary 120 (*)    All other components within normal limits  SARS CORONAVIRUS 2 (TAT 6-24 HRS)  ETHANOL    XR Chest Single View  Final Result    CT Head  Final Result    MR BRAIN WO CONTRAST    (Results Pending)    Medications  labetalol (NORMODYNE) injection 10 mg (has no administration in time range)  sodium chloride 0.9 % bolus 500 mL (0 mLs Intravenous Stopped 08/26/19 2133)  labetalol (NORMODYNE) injection 10 mg (10 mg Intravenous Given 08/26/19 2056)     Procedures  /  Critical Care .Critical Care Performed by: Maudie Flakes, MD Authorized by: Maudie Flakes, MD   Critical care provider statement:    Critical care time (minutes):  45   Critical care was necessary to treat or prevent  imminent or life-threatening deterioration of the following conditions: Hypertensive emergency.   Critical care was time spent personally by me on the following activities:  Discussions with consultants, evaluation of patient's response to treatment, examination of patient, ordering and performing treatments and interventions, ordering and review of laboratory studies, ordering and review of radiographic studies, pulse oximetry, re-evaluation of patient's condition, obtaining history from patient or surrogate and review of old charts    ED Course and Medical Decision Making  I have reviewed the triage vital signs and the nursing notes.  Pertinent  labs & imaging results that were available during my care of the patient were reviewed by me and considered in my medical decision making (see below for details).     Altered mental status, hypertensive, history of stroke but per report with his baseline facial droop and slurred speech.  The remainder of his exam is normal, moving his arms and legs normally, intermittently follows commands well but is confused most of the time.  No aphasia, no neglect, will CT head given this acute altered mental status and report of falls.  Awaiting laboratory evaluation as well.  CT head revealing age-indeterminate infarcts.  Patient continues to be hypertensive, was given 10 mg labetalol in response to systolics in the [id number], which improved blood pressure into the 180s transiently but blood pressure has returned into the 230s.  There is a question of whether to manage this as hypertensive emergency with more aggressive antihypertensive management or allow for permissive hypertension in the setting of acute stroke.  Will consult neurology for guidance.  9:55 PM update: Discussed with Dr. Leonel Ramsay of neurology, recommending permissive hypertension and obtaining MRI for further clarification.  Goal blood pressure will be under 220/120 until MRI further clarifies.  Will need  admission.  Signed out to oncoming provider at shift change.  Barth Kirks. [place]onia Small, [place] [email or web address]  Final Clinical Impressions(s) / ED Diagnoses     ICD-10-CM   1. Altered mental status, unspecified altered mental status type  R41.82   2. Altered behavior  R46.89 XR Chest Single View    XR Chest Single View  3. AKI (acute kidney injury) ([place])  N17.9   4. Hypertensive crisis  I16.9     ED Discharge Orders    None       Discharge Instructions Discussed with and Provided to Patient:   Discharge Instructions   None       Maudie Flakes, MD 08/26/19 2159

## 2019-08-26 NOTE — ED Notes (Signed)
Sent a urine culture with the urine specimen 

## 2019-08-26 NOTE — ED Triage Notes (Signed)
Reported AMS around 1700 per wife; wanders around and fall x 3; along w/ loose BM; reported prior stroke 3 years ago. Residual slurred speech and facial droop;

## 2019-08-27 ENCOUNTER — Inpatient Hospital Stay (HOSPITAL_COMMUNITY): Payer: No Typology Code available for payment source

## 2019-08-27 ENCOUNTER — Encounter (HOSPITAL_COMMUNITY): Payer: Self-pay | Admitting: *Deleted

## 2019-08-27 ENCOUNTER — Emergency Department (HOSPITAL_COMMUNITY): Payer: No Typology Code available for payment source

## 2019-08-27 DIAGNOSIS — E785 Hyperlipidemia, unspecified: Secondary | ICD-10-CM | POA: Diagnosis present

## 2019-08-27 DIAGNOSIS — Z833 Family history of diabetes mellitus: Secondary | ICD-10-CM | POA: Diagnosis not present

## 2019-08-27 DIAGNOSIS — G8194 Hemiplegia, unspecified affecting left nondominant side: Secondary | ICD-10-CM | POA: Diagnosis present

## 2019-08-27 DIAGNOSIS — R1310 Dysphagia, unspecified: Secondary | ICD-10-CM | POA: Diagnosis present

## 2019-08-27 DIAGNOSIS — I161 Hypertensive emergency: Secondary | ICD-10-CM | POA: Diagnosis present

## 2019-08-27 DIAGNOSIS — I10 Essential (primary) hypertension: Secondary | ICD-10-CM

## 2019-08-27 DIAGNOSIS — I169 Hypertensive crisis, unspecified: Secondary | ICD-10-CM | POA: Diagnosis present

## 2019-08-27 DIAGNOSIS — I63531 Cerebral infarction due to unspecified occlusion or stenosis of right posterior cerebral artery: Secondary | ICD-10-CM

## 2019-08-27 DIAGNOSIS — Z7982 Long term (current) use of aspirin: Secondary | ICD-10-CM | POA: Diagnosis not present

## 2019-08-27 DIAGNOSIS — L409 Psoriasis, unspecified: Secondary | ICD-10-CM | POA: Diagnosis present

## 2019-08-27 DIAGNOSIS — Z20828 Contact with and (suspected) exposure to other viral communicable diseases: Secondary | ICD-10-CM | POA: Diagnosis present

## 2019-08-27 DIAGNOSIS — R410 Disorientation, unspecified: Secondary | ICD-10-CM | POA: Diagnosis not present

## 2019-08-27 DIAGNOSIS — Z79899 Other long term (current) drug therapy: Secondary | ICD-10-CM | POA: Diagnosis not present

## 2019-08-27 DIAGNOSIS — E870 Hyperosmolality and hypernatremia: Secondary | ICD-10-CM | POA: Diagnosis present

## 2019-08-27 DIAGNOSIS — F1410 Cocaine abuse, uncomplicated: Secondary | ICD-10-CM | POA: Diagnosis present

## 2019-08-27 DIAGNOSIS — Z87891 Personal history of nicotine dependence: Secondary | ICD-10-CM | POA: Diagnosis not present

## 2019-08-27 DIAGNOSIS — W1830XA Fall on same level, unspecified, initial encounter: Secondary | ICD-10-CM | POA: Diagnosis present

## 2019-08-27 DIAGNOSIS — Z8673 Personal history of transient ischemic attack (TIA), and cerebral infarction without residual deficits: Secondary | ICD-10-CM | POA: Diagnosis not present

## 2019-08-27 DIAGNOSIS — I63331 Cerebral infarction due to thrombosis of right posterior cerebral artery: Secondary | ICD-10-CM | POA: Diagnosis present

## 2019-08-27 DIAGNOSIS — R4689 Other symptoms and signs involving appearance and behavior: Secondary | ICD-10-CM | POA: Diagnosis not present

## 2019-08-27 DIAGNOSIS — Z91013 Allergy to seafood: Secondary | ICD-10-CM | POA: Diagnosis not present

## 2019-08-27 DIAGNOSIS — Y92009 Unspecified place in unspecified non-institutional (private) residence as the place of occurrence of the external cause: Secondary | ICD-10-CM | POA: Diagnosis not present

## 2019-08-27 DIAGNOSIS — N1832 Chronic kidney disease, stage 3b: Secondary | ICD-10-CM | POA: Diagnosis present

## 2019-08-27 DIAGNOSIS — N179 Acute kidney failure, unspecified: Secondary | ICD-10-CM | POA: Diagnosis present

## 2019-08-27 DIAGNOSIS — H53462 Homonymous bilateral field defects, left side: Secondary | ICD-10-CM | POA: Diagnosis present

## 2019-08-27 DIAGNOSIS — I129 Hypertensive chronic kidney disease with stage 1 through stage 4 chronic kidney disease, or unspecified chronic kidney disease: Secondary | ICD-10-CM | POA: Diagnosis present

## 2019-08-27 DIAGNOSIS — R4182 Altered mental status, unspecified: Secondary | ICD-10-CM | POA: Diagnosis not present

## 2019-08-27 DIAGNOSIS — R29708 NIHSS score 8: Secondary | ICD-10-CM | POA: Diagnosis present

## 2019-08-27 DIAGNOSIS — E860 Dehydration: Secondary | ICD-10-CM | POA: Diagnosis present

## 2019-08-27 DIAGNOSIS — Y92238 Other place in hospital as the place of occurrence of the external cause: Secondary | ICD-10-CM | POA: Diagnosis present

## 2019-08-27 DIAGNOSIS — D490 Neoplasm of unspecified behavior of digestive system: Secondary | ICD-10-CM | POA: Diagnosis present

## 2019-08-27 DIAGNOSIS — R414 Neurologic neglect syndrome: Secondary | ICD-10-CM | POA: Diagnosis present

## 2019-08-27 DIAGNOSIS — Z23 Encounter for immunization: Secondary | ICD-10-CM | POA: Diagnosis not present

## 2019-08-27 DIAGNOSIS — G9341 Metabolic encephalopathy: Secondary | ICD-10-CM | POA: Diagnosis present

## 2019-08-27 HISTORY — DX: Cerebral infarction due to unspecified occlusion or stenosis of right posterior cerebral artery: I63.531

## 2019-08-27 LAB — RAPID URINE DRUG SCREEN, HOSP PERFORMED
Amphetamines: NOT DETECTED
Barbiturates: NOT DETECTED
Benzodiazepines: NOT DETECTED
Cocaine: POSITIVE — AB
Opiates: NOT DETECTED
Tetrahydrocannabinol: NOT DETECTED

## 2019-08-27 LAB — COMPREHENSIVE METABOLIC PANEL
ALT: 24 U/L (ref 0–44)
AST: 20 U/L (ref 15–41)
Albumin: 3.8 g/dL (ref 3.5–5.0)
Alkaline Phosphatase: 140 U/L — ABNORMAL HIGH (ref 38–126)
Anion gap: 11 (ref 5–15)
BUN: 7 mg/dL — ABNORMAL LOW (ref 8–23)
CO2: 25 mmol/L (ref 22–32)
Calcium: 9.1 mg/dL (ref 8.9–10.3)
Chloride: 105 mmol/L (ref 98–111)
Creatinine, Ser: 1.3 mg/dL — ABNORMAL HIGH (ref 0.61–1.24)
GFR calc Af Amer: >60 mL/min (ref >60)
GFR calc non Af Amer: 58 mL/min — ABNORMAL LOW (ref >60)
Glucose, Bld: 100 mg/dL — ABNORMAL HIGH (ref 70–99)
Potassium: 3.5 mmol/L (ref 3.5–5.1)
Sodium: 141 mmol/L (ref 135–145)
Total Bilirubin: 1.9 mg/dL — ABNORMAL HIGH (ref 0.3–1.2)
Total Protein: 7.1 g/dL (ref 6.5–8.1)

## 2019-08-27 LAB — HEMOGLOBIN A1C
Hgb A1c MFr Bld: 6.2 % — ABNORMAL HIGH (ref 4.8–5.6)
Mean Plasma Glucose: 131.24 mg/dL

## 2019-08-27 LAB — CBC
HCT: 49.5 % (ref 39.0–52.0)
Hemoglobin: 15.9 g/dL (ref 13.0–17.0)
MCH: 26.9 pg (ref 26.0–34.0)
MCHC: 32.1 g/dL (ref 30.0–36.0)
MCV: 83.8 fL (ref 80.0–100.0)
Platelets: 281 10*3/uL (ref 150–400)
RBC: 5.91 MIL/uL — ABNORMAL HIGH (ref 4.22–5.81)
RDW: 14.3 % (ref 11.5–15.5)
WBC: 8.3 10*3/uL (ref 4.0–10.5)
nRBC: 0 % (ref 0.0–0.2)

## 2019-08-27 LAB — SARS CORONAVIRUS 2 (TAT 6-24 HRS): SARS Coronavirus 2: NEGATIVE

## 2019-08-27 LAB — AMMONIA: Ammonia: 47 umol/L — ABNORMAL HIGH (ref 9–35)

## 2019-08-27 LAB — LIPID PANEL
Cholesterol: 167 mg/dL (ref 0–200)
HDL: 40 mg/dL — ABNORMAL LOW (ref >40)
LDL Cholesterol: 107 mg/dL — ABNORMAL HIGH (ref 0–99)
Total CHOL/HDL Ratio: 4.2 RATIO
Triglycerides: 98 mg/dL (ref <150)
VLDL: 20 mg/dL (ref 0–40)

## 2019-08-27 LAB — ECHOCARDIOGRAM COMPLETE

## 2019-08-27 LAB — HIV ANTIBODY (ROUTINE TESTING W REFLEX): HIV Screen 4th Generation wRfx: NONREACTIVE

## 2019-08-27 LAB — GAMMA GT: GGT: 24 U/L (ref 7–50)

## 2019-08-27 MED ORDER — ASPIRIN 325 MG PO TABS
325.0000 mg | ORAL_TABLET | Freq: Every day | ORAL | Status: DC
  Administered 2019-08-27 – 2019-09-16 (×18): 325 mg via ORAL
  Filled 2019-08-27 (×20): qty 1

## 2019-08-27 MED ORDER — DIAZEPAM 5 MG/ML IJ SOLN
5.0000 mg | Freq: Once | INTRAMUSCULAR | Status: AC
  Administered 2019-08-27: 5 mg via INTRAVENOUS
  Filled 2019-08-27: qty 2

## 2019-08-27 MED ORDER — ASPIRIN 300 MG RE SUPP
300.0000 mg | Freq: Every day | RECTAL | Status: DC
  Filled 2019-08-27 (×20): qty 1

## 2019-08-27 MED ORDER — HYDROCHLOROTHIAZIDE 25 MG PO TABS
25.0000 mg | ORAL_TABLET | Freq: Every day | ORAL | Status: DC
  Administered 2019-08-27 – 2019-09-06 (×10): 25 mg via ORAL
  Filled 2019-08-27 (×11): qty 1

## 2019-08-27 MED ORDER — LISINOPRIL 10 MG PO TABS
10.0000 mg | ORAL_TABLET | Freq: Every day | ORAL | Status: DC
  Administered 2019-08-27 – 2019-09-06 (×10): 10 mg via ORAL
  Filled 2019-08-27 (×11): qty 1

## 2019-08-27 MED ORDER — CLOPIDOGREL BISULFATE 75 MG PO TABS
75.0000 mg | ORAL_TABLET | Freq: Every day | ORAL | Status: DC
  Administered 2019-08-27 – 2019-09-16 (×18): 75 mg via ORAL
  Filled 2019-08-27 (×20): qty 1

## 2019-08-27 MED ORDER — ACETAMINOPHEN 160 MG/5ML PO SOLN: 650.0000 mg | ORAL | Status: DC | PRN

## 2019-08-27 MED ORDER — IOHEXOL 350 MG/ML SOLN
75.0000 mL | Freq: Once | INTRAVENOUS | Status: AC | PRN
  Administered 2019-08-27: 75 mL via INTRAVENOUS

## 2019-08-27 MED ORDER — STROKE: EARLY STAGES OF RECOVERY BOOK: Freq: Once | Status: DC

## 2019-08-27 MED ORDER — ATORVASTATIN CALCIUM 80 MG PO TABS
80.0000 mg | ORAL_TABLET | Freq: Every day | ORAL | Status: DC
  Administered 2019-08-27 – 2019-09-15 (×16): 80 mg via ORAL
  Filled 2019-08-27 (×18): qty 1

## 2019-08-27 MED ORDER — ACETAMINOPHEN 325 MG PO TABS
650.0000 mg | ORAL_TABLET | ORAL | Status: DC | PRN
  Administered 2019-09-06: 650 mg via ORAL
  Filled 2019-08-27: qty 2

## 2019-08-27 MED ORDER — ACETAMINOPHEN 650 MG RE SUPP: 650.0000 mg | RECTAL | Status: DC | PRN

## 2019-08-27 NOTE — Progress Notes (Addendum)
STROKE TEAM PROGRESS NOTE   HISTORY OF PRESENT ILLNESS (per record) [name] is a 63 y.o. male with a history of hypertension and stroke who presents with altered mental status that started earlier today.  Initially, it was unclear exactly as to what time this started today.  He was therefore not treated as a code stroke.  Due to agitation, he required to be given Haldol.  Then, he continued to be slightly agitated and required Versed in addition to the Haldol to obtain an MRI. An MRI was obtained to further evaluate his altered mental status and revealed a fairly large occipital stroke. LKW: Unclear tpa given?: no, unclear time of onset   INTERVAL HISTORY I have reviewed history of presenting illness with the patient, electronic medical records and imaging films in PACS.  He presented with left-sided peripheral vision loss and MRI shows right posterior cerebral artery infarct with occlusion of the right posterior cerebral artery on CT angiogram..  He states his neurological condition is unchanged.    OBJECTIVE Vitals:   08/27/19 0819 08/27/19 0830 08/27/19 0854 08/27/19 0934  BP: (!) 182/83 (!) 169/73 (!) 184/93 (!) 200/96  Pulse: 68 63 (!) 58 74  Resp: '16 14 10 10  ' Temp:      TempSrc:      SpO2: 98% 99% 99% 99%    CBC:  Recent Labs  Lab 08/26/19 2047 08/27/19 0846  WBC 8.5 8.3  HGB 17.0 15.9  HCT 52.3* 49.5  MCV 84.5 83.8  PLT 265 201    Basic Metabolic Panel:  Recent Labs  Lab 08/26/19 2047 08/27/19 0846  NA 140 141  K 3.5 3.5  CL 106 105  CO2 23 25  GLUCOSE 100* 100*  BUN 9 7*  CREATININE 1.32* 1.30*  CALCIUM 9.2 9.1    Lipid Panel:     Component Value Date/Time   CHOL 167 08/27/2019 0846   TRIG 98 08/27/2019 0846   HDL 40 (L) 08/27/2019 0846   CHOLHDL 4.2 08/27/2019 0846   VLDL 20 08/27/2019 0846   LDLCALC 107 (H) 08/27/2019 0846   HgbA1c:  Lab Results  Component Value Date   HGBA1C 6.2 (H) 08/27/2019   Urine Drug Screen:     Component  Value Date/Time   LABOPIA NONE DETECTED 08/27/2019 0029   COCAINSCRNUR POSITIVE (A) 08/27/2019 0029   LABBENZ NONE DETECTED 08/27/2019 0029   AMPHETMU NONE DETECTED 08/27/2019 0029   THCU NONE DETECTED 08/27/2019 0029   LABBARB NONE DETECTED 08/27/2019 0029    Alcohol Level     Component Value Date/Time   ETH <10 08/26/2019 2047    IMAGING  CT ANGIO HEAD W OR WO CONTRAST CT ANGIO NECK W OR WO CONTRAST 08/27/2019 IMPRESSION:  1. 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  2. Bilateral multifocal severe M2 segment stenoses.  3. 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 Histologic sampling should be considered due to the degree of overlap of imaging features between benign and malignant parotid tumors.  4. Aortic Atherosclerosis (ICD10-I70.0).   CT Head 08/26/2019 IMPRESSION:  1. There is lucency identified in the right cerebellum, likely due to an infarct, age indeterminate.  2. Old infarcts identified in the left frontal lobe, left cerebellum, and bilateral basal ganglia.   MR BRAIN WO CONTRAST 08/27/2019 IMPRESSION:  1. Large, acute right PCA territory infarct involving the medial temporal lobe and  occipital lobe with additional smaller foci of acute ischemia within the right thalamus and right middle cerebellar peduncle.  2. Loss of the normal right PCA flow void consistent with occlusion.  3. Multiple foci of chronic microhemorrhage in a predominantly peripheral distribution.  4. No acute hemorrhage or mass effect.   XR Chest Single View 08/26/2019 IMPRESSION:  1. Nonspecific elevation of the right hemidiaphragm with adjacent opacity likely reflecting atelectasis.  2.  No other acute cardiopulmonary abnormality.   Transthoracic Echocardiogram  00/00/2020 Pending   ECG - SR rate 91 BPM. (See cardiology reading for complete details)   PHYSICAL EXAM Blood  pressure (!) 200/96, pulse 74, temperature 98.6 F (37 C), temperature source Oral, resp. rate 10, SpO2 99 %. Pleasant middle-aged African-American male not in distress. . Afebrile. Head is nontraumatic. Neck is supple without bruit.    Cardiac exam no murmur or gallop. Lungs are clear to auscultation. Distal pulses are well felt. Neurological Exam :  He is awake alert oriented to time place and person.  He has diminished attention, registration and recall.  Poor 3 word recall 0/3.  Speech is clear without aphasia or dysarthria.  Extraocular movements are full range without nystagmus.  He has left homonymous hemianopsia.  Face is asymmetric with left lower facial droop.  Tongue midline.  Motor system exam shows left hemiplegia but patient is not very cooperative for detailed muscle testing he has some antigravity strength in the left upper and lower extremity but is a clear drift and diminished tone on the left side.  Moves right side purposefully against gravity.  Sensation appears diminished on the left compared to the right.  Deep tendon reflexes are normal on the right and depressed on the left.  Left plantar is upgoing right is downgoing.  Gait not tested.   ASSESSMENT/PLAN [name] is a 63 y.o. male with history of substance abuse, hypertension and stroke (2014) who presents with altered mental status and agitation.   Stroke:  PCA territory infarcts - embolic - source unknown but likely due to diffuse intracranial atherosclerosis versus cocaine cardiomyopathy or vasculitis  Resultant left hemiparesis and visual field loss  Code Stroke CT Head - not ordered  CT head - There is lucency identified in the right cerebellum, likely due to an infarct, age indeterminate. Old infarcts identified in the left frontal lobe, left cerebellum, and bilateral basal ganglia.   MRI head - Large, acute right PCA territory infarct involving the medial temporal lobe and occipital lobe with additional  smaller foci of acute ischemia within the right thalamus and right middle cerebellar peduncle. Loss of the normal right PCA flow void consistent with occlusion. Multiple foci of chronic microhemorrhage in a predominantly peripheral distribution.   MRA head - not ordered  CTA H&N - Occlusion of the right posterior cerebral artery at the P1 P2 junction. Bilateral multifocal severe M2 segment stenoses.   CT Perfusion - not performed  Carotid Doppler - CTA neck performed - carotid dopplers not indicated.  2D Echo - pending  Lacey Jensen Virus 2 - negative  LDL - 107  HgbA1c - 6.2  UDS - cocaine  VTE prophylaxis - SCDs Diet  Diet Order            Diet NPO time specified  Diet effective now              aspirin 325 mg daily prior to admission, now on aspirin 325 mg daily  Patient counseled to be compliant  with his antithrombotic medications  Ongoing aggressive stroke risk factor management  Therapy recommendations:  pending  Disposition:  Pending  Hypertension  Home BP meds: Lisinopril  Current BP meds: Lisinopril and HCTZ (Labetalol prn)  Blood pressure somewhat high at times but within post stroke/TIA parameters . Permissive hypertension (OK if < 220/120) but gradually normalize in 5-7 days   . Long-term BP goal normotensive  Hyperlipidemia  Home Lipid lowering medication: Lipitor 10 mg daily  LDL 107, goal < 70  Current lipid lowering medication: Lipitor 80 mg daily   Continue statin at discharge   Other Stroke Risk Factors  Advanced age  Former cigarette smoker - quit  ETOH use, advised to drink no more than 1 alcoholic beverage per day.  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  Histologic sampling should be considered due to the degree of overlap of imaging features between benign and malignant parotid tumors.   Agitation - likely related to cocaine  Cocaine use   Full code  Elevated ammonia - 47  Elevated Alk Phos - 140      Hospital day # 0 I have personally obtained history,examined this patient, reviewed notes, independently viewed imaging studies, participated in medical decision making and plan of care.ROS completed by me personally and pertinent positives fully documented  I have made any additions or clarifications directly to the above note.  He presented with altered mental status and MRI shows multiple posterior circulation strokes on the right etiology indeterminate possibly combination of ventricular atherosclerosis versus cocaine related vasculitis or cardiomyopathy.  Recommend aspirin for now and if patient able to swallow then dual antiplatelet therapy for 3 months.  Patient counseled to quit smoking using cocaine and smoking cigarettes.  Continue ongoing stroke work-up and aggressive risk factor modification.  Greater than 50% time during this 35-minute visit were spent on counseling and coordination of care about his stroke and discussion with care team.  Discussed with Dr. Oretha Milch and answered questions Antony Contras, MD Medical Director [place] Pager: [phone] 08/27/2019 4:49 PM   To contact Stroke Continuity provider, please refer to [email or web address] After hours, contact General Neurology

## 2019-08-27 NOTE — ED Notes (Signed)
Pt transported to CT ?

## 2019-08-27 NOTE — ED Notes (Signed)
Patient transported to MRI 

## 2019-08-27 NOTE — Evaluation (Signed)
Speech Language Pathology Evaluation Patient Details Name: [name] MRN: [id number] DOB: [birth year] Today's Date: 08/27/2019 Time: [id number] SLP Time Calculation (min) (ACUTE ONLY): 11 min  Problem List:  Patient Active Problem List   Diagnosis Date Noted  . Stroke ([place]) 08/27/2019  . Aphasia as late effect of cerebrovascular accident 05/14/2013  . 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 Spastic hemiplegia affecting dominant side ([place]) 03/12/2013  . 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 CVA (cerebral infarction) 02/03/2013  . HTN (hypertension) 02/03/2013  . Tobacco abuse 02/03/2013   Past Medical History:  Past Medical History:  Diagnosis Date  . Anginal pain ([place])   . Heart murmur   . Hypertension   . Psoriasis    "back and stomach" (02/03/2013)  . Stroke ([place]) 02/01/2013   left frontal CVA and symptoms of right arm and leg weakness and expressive aphasia/notes 02/03/2013 (02/03/2013)   Past Surgical History:  Past Surgical History:  Procedure Laterality Date  . NO PAST SURGERIES    . TEE WITHOUT CARDIOVERSION N/A 02/09/2013   Procedure: TRANSESOPHAGEAL ECHOCARDIOGRAM (TEE);  Surgeon: Lelon Perla, MD;  Location: [facility] ENDOSCOPY;  Service: Cardiovascular;  Laterality: N/A;   HPI:  Pt is a 63 yo male admitted with AMS and falls. MRI showed a large PCA territory infarct. PMH includes: CVA (L frontal in 2014, treated for aphasia, mild dysarthria, and cognitive impairment at that time), HTN   Assessment / Plan / Recommendation Clinical Impression  Pt is oriented x2 (person, location) and has limited recall of events of this hospital stay so far (as well as events leading up to it). He does not remember working with PT, who was leaving the room as I entered it. Pt has limited awareness of deficits, impulsivity, and trouble with problem solving, making it difficult to work through functional tasks. Impairments are also noted with sustained attention.  Pt's speech is dysarthric although per chart this may be residual from prior stroke. Given that he was working PTA, I suspect that a lot of his cognitive deficits are acute. Pt would benefit from SLP f/u to increase functional independence and safety.     SLP Assessment  SLP Recommendation/Assessment: Patient needs continued Speech Lanaguage Pathology Services SLP Visit Diagnosis: Cognitive communication deficit (R41.841)    Follow Up Recommendations  Inpatient Rehab    Frequency and Duration min 2x/week  2 weeks      SLP Evaluation Cognition  Overall Cognitive Status: Impaired/Different from baseline Arousal/Alertness: Awake/alert Orientation Level: Oriented to person;Oriented to place;Disoriented to time;Disoriented to situation Attention: Sustained Sustained Attention: Impaired Sustained Attention Impairment: Functional basic Memory: Impaired Memory Impairment: Decreased recall of new information Awareness: Impaired Awareness Impairment: Intellectual impairment;Emergent impairment;Anticipatory impairment Problem Solving: Impaired Problem Solving Impairment: Functional basic Behaviors: Impulsive Safety/Judgment: Impaired       Comprehension  Auditory Comprehension Overall Auditory Comprehension: Impaired Commands: Impaired One Step Basic Commands: 50-74% accurate Conversation: Simple    Expression Expression Primary Mode of Expression: Verbal Verbal Expression Overall Verbal Expression: Appears within functional limits for tasks assessed   Oral / Motor  Motor Speech Overall Motor Speech: Impaired(reportedly impaired at baseline?)   GO                    Venita Sheffield Mandy Fitzwater 08/27/2019, 4:24 PM  Pollyann Glen, M.A. [place] [facility] Environmental education officer [phone] Office [phone]

## 2019-08-27 NOTE — Consult Note (Addendum)
Neurology Consultation Reason for Consult: Altered Mental Status Referring Physician: Sedonia Small, M  CC: Altered mental status  History is obtained from: Chart  HPI: [name] is a 63 y.o. male with a history of hypertension, stroke who presents with altered mental status that started earlier today.  Initially, it was unclear exactly as to what time this started today.  He was therefore not treated as a code stroke.  Due to agitation, he required to be given Haldol.  Then, he continued to be slightly agitated and required Versed in addition to the Haldol to obtain an MRI. An MRI was obtained to further evaluate his altered mental status and revealed a fairly large occipital stroke.   LKW: Unclear tpa given?: no, unclear time of onset    ROS: A 14 point ROS was performed and is negative except as noted in the HPI.   Past Medical History:  Diagnosis Date  . Anginal pain ([place])   . Heart murmur   . Hypertension   . Psoriasis    "back and stomach" (02/03/2013)  . Stroke ([place]) 02/01/2013   left frontal CVA and symptoms of right arm and leg weakness and expressive aphasia/notes 02/03/2013 (02/03/2013)     Family history: Unable to obtain due to altered mental status.   Social History:  reports that he quit smoking about 6 years ago. His smoking use included cigarettes. He has a 9.25 pack-year smoking history. He does not have any smokeless tobacco history on file. He reports current alcohol use of about 17.0 standard drinks of alcohol per week. He reports that he does not use drugs.   Exam: Current vital signs: BP (!) 176/87 (BP Location: Left Arm)   Pulse 67   Temp 98.6 F (37 C) (Oral)   Resp 17   SpO2 96%  Vital signs in last 24 hours: Temp:  [98.6 F (37 C)] 98.6 F (37 C) (12/10 1927) Pulse Rate:  [67-91] 67 (12/11 0155) Resp:  [13-18] 17 (12/11 0155) BP: (176-231)/(87-125) 176/87 (12/11 0155) SpO2:  [96 %-99 %] 96 % (12/11 0155)   Physical Exam  Constitutional:  Appears well-developed and well-nourished.  Psych: Affect appropriate to situation Eyes: No scleral injection HENT: No OP obstrucion MSK: no joint deformities.  Cardiovascular: Normal rate and regular rhythm.  Respiratory: Effort normal, non-labored breathing GI: Soft.  No distension. There is no tenderness.  Skin: WDI  Neuro: Mental Status: Patient is awake, he is fairly unwilling to cooperate with testing.  He does follow simple commands and is able to tell me his name, and that the year is 33. Cranial Nerves: II: Does not comply with testing and keeps his eyes tightly closed pupils are reactive bilaterally, I suspect roughly equal, but I am unable to get both eyes open at the same time. III,IV, VI: Appears to have a right exotropia, but again he is closing his eyes tightly throughout the examination. V: He responds to stimulation bilaterally on the face VII: Facial movement is grossly symmetric Motor: He moves all extremities spontaneously, he does not cooperate with formal strength testing. sensory: He does respond to noxious stimulation in all 4 extremities Cerebellar: He does not perform      I have reviewed labs in epic and the results pertinent to this consultation are: Creatinine 1.3  I have reviewed the images obtained: MRI brain-large PCA territory infarct on the right.  This is in the setting of severe chronic ischemic disease.  Impression: 63 year old male with altered mental status in  the setting of large PCA infarct.  I think some of his current exam is due to the Haldol and Ativan that he was given earlier today.  Though would not typically think of this infarct is giving encephalopathy as significant as what I am seeing, I think in the setting of his severe underlying organic brain disease this is not unreasonable.  Stroke could be embolic or thrombotic and he will need further work-up.  Recommendations: - HgbA1c, fasting lipid panel - MRI  of the brain  without contrast - Frequent neuro checks - Echocardiogram -CT angiogram of the head and neck - Prophylactic therapy-Antiplatelet med: Aspirin - dose 325mg  PO or 300mg  PR - Risk factor modification - Telemetry monitoring - PT consult, OT consult, Speech consult - Stroke team to follow   Roland Rack, MD [facility] [phone]  If 7pm- 7am, please page neurology on call as listed in [place].

## 2019-08-27 NOTE — Progress Notes (Signed)
[facility]  PROGRESS NOTE  [name] EGB:[id number] DOB: 07-Feb-1956 DOA: 08/26/2019 PCP: Alroy Dust, L.[name], MD Admit date - 08/26/2019   Admitting Physician Jani Gravel, MD  Outpatient Primary MD for the patient is Sartell, L.[name], MD  LOS - 0 Brief Narrative   [name] is a 63 y.o. year old male with medical history significant for hypertension and previous stroke with some residual deficit who presented on 08/26/2019 with worsening confusion and several falls at home x1 day and was found to have hypertensive emergency with large occipital stroke. In ED initially noted to be hypertensive with blood pressure 230/125 and creatinine of 1.3.  Upon evaluation in the ED patient was intermittently following commands with noticeable facial droop and slurred speech per family consistent with his baseline of previous stroke.  CT head initially revealed age-indeterminate infarct.  Given persistently high blood pressure and intermittent confusion neurology was consulted who recommended permissive hypertension and ordered MRI showed a large occipital lobe stroke.   Hospital course complicated by continued confusion. Patient continues to get up from bed and urinate. He fell in the ED and stuck head on door. CT head done and showed no acute change from prior stroke  Subjective  [name] today has no acute complaint, No headache, No chest pain, No abdominal pain - No Nausea,  A & P   Large PCA territory infarct on the right.  History of previous stroke.  Found to be cocaine positive.  Likely in the setting of hypertensive emergency and substance abuse, likely thrombotic could also be embolic.  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  Preserved EF on TTE without PFO.  A1c 6.2, LDL 107, UDS positive for cocaine -Neurology consulted on admission and stroke team following -Frequent neuro checks -Aspirin 325 mg p.o. or 300 mg PR, speech cleared for dysphagia 2 with supervisionso will  add plavix 75 mg qd for DAPT x 3 months per neurology -Monitor on telemetry, PT/OT/speech consult  Confusion.  Likely multifactorial large PCA stroke in addition to residual effects of Haldol/Ativan received in the ED. fell again in ED hitting head on door after getting up from bed -Repeat CT head shows no evolution or worsening of known disease -Avoid further sedatives -Unfortunately currently needs restraints, given high fall risk and inability to follow commands safely  Hypertension.  Allow permissive hypertension, okay if less than 220/120 over the next 5 to 7 days -Lisinopril, HCTZ, labetalol as needed  Substance abuse. 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  Incidentally found on imaging for stroke work-up -Histologic sampling should be considered due to degree of overlap of imaging features between benign and malignant parotid tumors by ENT  Elevated alk phos.  No elevation in LFTs or bilirubin.  GGT unremarkable.  No abdominal exam pain -Closely monitor  Renal insufficiency -Unclear if acute -Last creatinine from 2041.07 -1.32 on admission -Avoid nephrotoxins, monitor creatinine on BMP, has normal output   Family Communication  : Called wife at listed number, left voice message  Code Status : Full code  Disposition Plan  : PT/OT, close monitoring of neuro status, continue aspirin and Plavix  Consults  : Neurology  Procedures  : TEE  DVT Prophylaxis  : Aspirin, Plavix, SCDs  Lab Results  Component Value Date   PLT 281 08/27/2019    Diet :  Diet Order            DIET DYS 2 Room service appropriate? Yes; Fluid consistency: Thin  Diet effective now               Inpatient Medications Scheduled Meds: .  stroke: mapping our early stages of recovery book   Does not  apply Once  . aspirin  300 mg Rectal Daily   Or  . aspirin  325 mg Oral Daily  . atorvastatin  80 mg Oral q1800  . hydrochlorothiazide  25 mg Oral Daily  . lisinopril  10 mg Oral Daily   Continuous Infusions: PRN Meds:.acetaminophen **OR** acetaminophen (TYLENOL) oral liquid 160 mg/5 mL **OR** acetaminophen, labetalol  Antibiotics  :   Anti-infectives (From admission, onward)   None       Objective   Vitals:   08/27/19 1309 08/27/19 1335 08/27/19 1418 08/27/19 1631  BP: (!) 195/95 (!) 185/97 (!) 154/94 (!) 192/106  Pulse: 77  73 97  Resp: 18 (!) 29    Temp:   99.2 F (37.3 C) 98.6 F (37 C)  TempSrc:   Oral Oral  SpO2: 99%  100% 100%    SpO2: 100 %  Wt Readings from Last 3 Encounters:  06/10/13 85.3 kg  05/14/13 80.8 kg  03/12/13 83.9 kg    No intake or output data in the 24 hours ending 08/27/19 1644  Physical Exam:  Awake Alert, oriented to place, self Restraints in place Decreased attention Left face droop Moves right side against gravity Inconsistent with following commands "when asked to lift legs or smile, lifts right arm" Platea.AT, No JVD Symmetrical Chest wall movement, Good air movement bilaterally on room air, CTAB RRR,No Gallops,Rubs or new Murmurs,  +ve B.Sounds, Abd Soft, No tenderness,  No Cyanosis, Clubbing or edema, No new Rash or bruise     I have personally reviewed the following:   Data Reviewed:  CBC Recent Labs  Lab 08/26/19 2047 08/27/19 0846  WBC 8.5 8.3  HGB 17.0 15.9  HCT 52.3* 49.5  PLT 265 281  MCV 84.5 83.8  MCH 27.5 26.9  MCHC 32.5 32.1  RDW 14.5 14.3    Chemistries  Recent Labs  Lab 08/26/19 2047 08/27/19 0846  NA 140 141  K 3.5 3.5  CL 106 105  CO2 23 25  GLUCOSE 100* 100*  BUN 9 7*  CREATININE 1.32* 1.30*  CALCIUM 9.2 9.1  AST 16 20  ALT 25 24  ALKPHOS 140* 140*  BILITOT 1.1 1.9*    ------------------------------------------------------------------------------------------------------------------ Recent Labs    08/27/19 0846  CHOL 167  HDL 40*  LDLCALC 107*  TRIG 98  CHOLHDL 4.2    Lab Results  Component Value Date   HGBA1C 6.2 (H) 08/27/2019   ------------------------------------------------------------------------------------------------------------------ No results for input(s): TSH, T4TOTAL, T3FREE, THYROIDAB in the last 72 hours.  Invalid input(s): FREET3 ------------------------------------------------------------------------------------------------------------------ No results for input(s): VITAMINB12, FOLATE, FERRITIN, TIBC, IRON, RETICCTPCT in the last 72 hours.  Coagulation profile No results for input(s): INR, PROTIME in the last 168 hours.  No results for input(s): DDIMER in the last 72 hours.  Cardiac Enzymes No results for input(s): CKMB, TROPONINI, MYOGLOBIN in the last 168 hours.  Invalid input(s): CK ------------------------------------------------------------------------------------------------------------------ No results found for: BNP  Micro Results Recent Results (from the past 240 hour(s))  SARS CORONAVIRUS 2 (TAT 6-24 HRS) Nasopharyngeal Nasopharyngeal Swab     Status: None   Collection Time: 08/26/19  9:54 PM   Specimen: Nasopharyngeal Swab  Result Value Ref Range Status   SARS Coronavirus 2 NEGATIVE NEGATIVE Final    Comment: (NOTE) SARS-CoV-2 target nucleic acids are NOT DETECTED. The SARS-CoV-2 RNA is generally detectable in upper and lower respiratory specimens during the acute phase of infection. Negative results do not preclude SARS-CoV-2 infection, do not rule out co-infections with other pathogens, and should not be used as the sole basis for treatment or other patient management decisions. Negative results must be combined with clinical observations, patient history, and epidemiological information. The  expected result is Negative. Fact Sheet for Patients: SugarRoll.be Fact Sheet for Healthcare Providers: https://www.woods-mathews.com/ This test is not yet approved or cleared by the [place] FDA and  has been authorized for detection and/or diagnosis of SARS-CoV-2 by FDA under an Emergency Use Authorization (EUA). This EUA will remain  in effect (meaning this test can be used) for the duration of the COVID-19 declaration under Section 56 4(b)(1) of the Act, 21 U.S.C. section 360bbb-3(b)(1), unless the authorization is terminated or revoked sooner. Performed at [facility], [place] [address], [place], [place] [address]     Radiology Reports CT ANGIO HEAD W OR WO CONTRAST  Result Date: 08/27/2019 CLINICAL DATA:  Stroke follow-up. Known right PCA stroke. EXAM: CT ANGIOGRAPHY HEAD AND NECK TECHNIQUE: Multidetector CT imaging of the head and neck was performed using the standard protocol during bolus administration of intravenous contrast. Multiplanar CT image reconstructions and MIPs were obtained to evaluate the vascular anatomy. Carotid stenosis measurements (when applicable) are obtained utilizing NASCET criteria, using the distal internal carotid diameter as the denominator. CONTRAST:  31m OMNIPAQUE IOHEXOL 350 MG/ML SOLN COMPARISON:  None. FINDINGS: CTA NECK FINDINGS SKELETON: There is no bony spinal canal stenosis. No lytic or blastic lesion. 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 UPPER CHEST: No pneumothorax or pleural effusion. No nodules or masses. AORTIC ARCH: There is mild calcific atherosclerosis of the aortic arch. There is no aneurysm, dissection or hemodynamically significant stenosis of the visualized portion of the aorta. Conventional 3 vessel aortic branching pattern. The visualized proximal subclavian arteries are widely patent. RIGHT CAROTID SYSTEM: Normal without  aneurysm, dissection or stenosis. LEFT CAROTID SYSTEM: Normal without aneurysm, dissection or stenosis. VERTEBRAL ARTERIES: Right dominant configuration. Both origins are clearly patent. There is no dissection, occlusion or flow-limiting stenosis to the skull base (V1-V3 segments). CTA HEAD FINDINGS POSTERIOR CIRCULATION: --Vertebral arteries: Normal V4 segments. --Posterior inferior cerebellar arteries (PICA): Patent origins from the vertebral arteries. --Anterior inferior cerebellar arteries (AICA): Patent origins from the basilar artery. --Basilar artery: Normal. --Superior cerebellar arteries: Normal. --Posterior cerebral arteries: The right PCA is occluded at the P1 P2 junction. ANTERIOR CIRCULATION: --Intracranial internal carotid arteries: Atherosclerotic calcification of the internal carotid arteries at the skull base without hemodynamically significant stenosis. --Anterior cerebral arteries (ACA): Normal. Absent right A1 segment, normal variant --Middle cerebral arteries (MCA): The M1 segments are normal. There is multifocal severe stenosis of the M2 segments bilaterally. VENOUS SINUSES: As permitted by contrast timing, patent. ANATOMIC VARIANTS: None Review of the MIP images confirms the above findings. IMPRESSION: 1. 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 2. Bilateral multifocal severe M2 segment stenoses. 3. 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 Histologic sampling should be considered due to the degree of overlap of imaging features between benign and malignant parotid tumors.  4. Aortic Atherosclerosis (ICD10-I70.0). Electronically Signed   By: Ulyses Jarred M.D.   On: 08/27/2019 03:34   CT Head Wo Contrast  Result Date: 08/27/2019 CLINICAL DATA:  History of cocaine abuse.  Status post fall. EXAM: CT HEAD WITHOUT CONTRAST TECHNIQUE: Contiguous axial images were obtained from the  base of the skull through the vertex without intravenous contrast. COMPARISON:  Brain MRI 08/27/2019.  Head CT scan 08/26/2019. FINDINGS: Brain: No evidence of hemorrhage, hydrocephalus, extra-axial collection or mass lesion/mass effect. Acute right PCA territory infarct without hemorrhagic transformation is again seen. Atrophy, chronic microvascular ischemic change, remote left ACA infarct and remote bilateral cerebellar infarcts, larger on the right also again seen. Vascular: Atherosclerosis. Skull: Intact.  No focal lesion. Sinuses/Orbits: Negative. Other: None. IMPRESSION: Acute right PCA territory infarct is identified as seen on the comparison examinations. Negative for hemorrhagic transformation. Atrophy, chronic microvascular ischemic change and remote infarcts as described above. Electronically Signed   By: Inge Rise M.D.   On: 08/27/2019 12:54   CT Head  Result Date: 08/26/2019 CLINICAL DATA:  Status post fall, altered mental status. EXAM: CT HEAD WITHOUT CONTRAST TECHNIQUE: Contiguous axial images were obtained from the base of the skull through the vertex without intravenous contrast. COMPARISON:  Feb 03, 2013 FINDINGS: Brain: There is no midline shift, hydrocephalus, or mass. No acute hemorrhage is identified. There is lucency identified in the right cerebellum, likely due to an infarct, age indeterminate. Old infarct is identified in the left cerebellum. Encephalomalacia of the left frontal lobe is identified consistent with old infarct. Small old lacunar infarctions are identified in bilateral basal ganglia. Vascular: No hyperdense vessel is noted. Skull: Normal. Negative for fracture or focal lesion. Sinuses/Orbits: No acute finding. Other: None. IMPRESSION: 1. There is lucency identified in the right cerebellum, likely due to an infarct, age indeterminate. 2. Old infarcts identified in the left frontal lobe, left cerebellum, and bilateral basal ganglia. Electronically Signed   By:  Abelardo Diesel M.D.   On: 08/26/2019 20:05   CT ANGIO NECK W OR WO CONTRAST  Result Date: 08/27/2019 CLINICAL DATA:  Stroke follow-up. Known right PCA stroke. EXAM: CT ANGIOGRAPHY HEAD AND NECK TECHNIQUE: Multidetector CT imaging of the head and neck was performed using the standard protocol during bolus administration of intravenous contrast. Multiplanar CT image reconstructions and MIPs were obtained to evaluate the vascular anatomy. Carotid stenosis measurements (when applicable) are obtained utilizing NASCET criteria, using the distal internal carotid diameter as the denominator. CONTRAST:  56m OMNIPAQUE IOHEXOL 350 MG/ML SOLN COMPARISON:  None. FINDINGS: CTA NECK FINDINGS SKELETON: There is no bony spinal canal stenosis. No lytic or blastic lesion. 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 UPPER CHEST: No pneumothorax or pleural effusion. No nodules or masses. AORTIC ARCH: There is mild calcific atherosclerosis of the aortic arch. There is no aneurysm, dissection or hemodynamically significant stenosis of the visualized portion of the aorta. Conventional 3 vessel aortic branching pattern. The visualized proximal subclavian arteries are widely patent. RIGHT CAROTID SYSTEM: Normal without aneurysm, dissection or stenosis. LEFT CAROTID SYSTEM: Normal without aneurysm, dissection or stenosis. VERTEBRAL ARTERIES: Right dominant configuration. Both origins are clearly patent. There is no dissection, occlusion or flow-limiting stenosis to the skull base (V1-V3 segments). CTA HEAD FINDINGS POSTERIOR CIRCULATION: --Vertebral arteries: Normal V4 segments. --Posterior inferior cerebellar arteries (PICA): Patent origins from the vertebral arteries. --Anterior inferior cerebellar arteries (AICA): Patent origins from the basilar artery. --Basilar artery:  Normal. --Superior cerebellar arteries: Normal. --Posterior cerebral arteries: The right PCA is occluded at  the P1 P2 junction. ANTERIOR CIRCULATION: --Intracranial internal carotid arteries: Atherosclerotic calcification of the internal carotid arteries at the skull base without hemodynamically significant stenosis. --Anterior cerebral arteries (ACA): Normal. Absent right A1 segment, normal variant --Middle cerebral arteries (MCA): The M1 segments are normal. There is multifocal severe stenosis of the M2 segments bilaterally. VENOUS SINUSES: As permitted by contrast timing, patent. ANATOMIC VARIANTS: None Review of the MIP images confirms the above findings. IMPRESSION: 1. 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 2. Bilateral multifocal severe M2 segment stenoses. 3. 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 Histologic sampling should be considered due to the degree of overlap of imaging features between benign and malignant parotid tumors. 4. Aortic Atherosclerosis (ICD10-I70.0). Electronically Signed   By: Ulyses Jarred M.D.   On: 08/27/2019 03:34   MR BRAIN WO CONTRAST  Result Date: 08/27/2019 CLINICAL DATA:  Encephalopathy EXAM: MRI HEAD WITHOUT CONTRAST TECHNIQUE: Multiplanar, multiecho pulse sequences of the brain and surrounding structures were obtained without intravenous contrast. COMPARISON:  Brain MRI 02/03/2013 and head CT 08/26/2019 FINDINGS: Brain: There is a large right PCA territory infarct predominantly affecting the medial temporal lobe and occipital lobe with smaller foci of ischemia in the right thalamus and right middle cerebellar peduncle. There is a large area of encephalomalacia within the left anterior cerebral artery territory. There is diffuse confluent white matter hyperintensity compatible with ischemic microangiopathy. There are old bilateral cerebellar infarcts. Vascular: There is loss of the normal right PCA flow void. There are 10-20 scattered foci of chronic  microhemorrhage in a predominantly peripheral distribution. No acute hemorrhage. Skull and upper cervical spine: The bone marrow signal of the cranium and upper cervical vertebrae is normal. There is no skull base lesion. The visualized upper cervical spinal cord is normal. Sinuses/Orbits: There is no paranasal sinus fluid level or advanced mucosal thickening. There is no mastoid or middle ear effusion. The orbits are normal. Other: None IMPRESSION: 1. Large, acute right PCA territory infarct involving the medial temporal lobe and occipital lobe with additional smaller foci of acute ischemia within the right thalamus and right middle cerebellar peduncle. 2. Loss of the normal right PCA flow void consistent with occlusion. 3. Multiple foci of chronic microhemorrhage in a predominantly peripheral distribution. 4. No acute hemorrhage or mass effect. Electronically Signed   By: Ulyses Jarred M.D.   On: 08/27/2019 02:13   XR Chest Single View  Result Date: 08/26/2019 CLINICAL DATA:  Altered behavior, confusion and falls today EXAM: PORTABLE CHEST 1 VIEW COMPARISON:  Radiograph Feb 03, 2013 FINDINGS: Nonspecific elevation of the right hemidiaphragm with adjacent opacity likely reflecting atelectasis. No consolidation, convincing features of edema, pneumothorax or effusion. The aorta is calcified. The remaining cardiomediastinal contours are unremarkable. Degenerative changes are present in the imaged spine and shoulders. No acute osseous or soft tissue abnormality. IMPRESSION: 1. Nonspecific elevation of the right hemidiaphragm with adjacent opacity likely reflecting atelectasis. 2.  No other acute cardiopulmonary abnormality. Electronically Signed   By: Lovena Le M.D.   On: 08/26/2019 20:30   ECHOCARDIOGRAM COMPLETE  Result Date: 08/27/2019   ECHOCARDIOGRAM REPORT   Patient Name:   AMONI MORALES Date of Exam: 08/27/2019 Medical Rec #:  [id number]     Height:       69.0 in Accession #:  [id number]    Weight:        188.0 lb Date of Birth:  August 28, 1956     BSA:          2.01 m Patient Age:    70 years      BP:           132/101 mmHg Patient Gender: M             HR:           61 bpm. Exam Location:  Inpatient Procedure: 2D Echo Indications:    Stroke 434.91/I163.9  History:        Patient has prior history of Echocardiogram examinations, most                 recent 02/09/2013. Stroke; Risk Factors:Hypertension.  Sonographer:    Clayton Lefort RDCS (AE) Referring Phys: [id number] Jani Gravel  Sonographer Comments: Patient unable to hold breath when requested. IMPRESSIONS  1. Left ventricular ejection fraction, by visual estimation, is 65 to 70%. The left ventricle has normal function. There is severely increased left ventricular hypertrophy.  2. Left ventricular diastolic parameters are consistent with Grade I diastolic dysfunction (impaired relaxation).  3. The left ventricle has no regional wall motion abnormalities.  4. Global right ventricle has normal systolic function.The right ventricular size is normal. No increase in right ventricular wall thickness.  5. Left atrial size was normal.  6. Right atrial size was normal.  7. The mitral valve is normal in structure. Trivial mitral valve regurgitation.  8. The tricuspid valve is normal in structure. Tricuspid valve regurgitation is not demonstrated.  9. The aortic valve is normal in structure. Aortic valve regurgitation is not visualized. Mild aortic valve sclerosis without stenosis. 10. The pulmonic valve was grossly normal. Pulmonic valve regurgitation is not visualized. 11. The atrial septum is grossly normal. FINDINGS  Left Ventricle: Left ventricular ejection fraction, by visual estimation, is 65 to 70%. The left ventricle has normal function. The left ventricle has no regional wall motion abnormalities. There is severely increased left ventricular hypertrophy. Asymmetric left ventricular hypertrophy. Left ventricular diastolic parameters are consistent with Grade I diastolic  dysfunction (impaired relaxation). Right Ventricle: The right ventricular size is normal. No increase in right ventricular wall thickness. Global RV systolic function is has normal systolic function. Left Atrium: Left atrial size was normal in size. Right Atrium: Right atrial size was normal in size Pericardium: There is no evidence of pericardial effusion. Mitral Valve: The mitral valve is normal in structure. Trivial mitral valve regurgitation. Tricuspid Valve: The tricuspid valve is normal in structure. Tricuspid valve regurgitation is not demonstrated. Aortic Valve: The aortic valve is normal in structure. Aortic valve regurgitation is not visualized. Mild aortic valve sclerosis is present, with no evidence of aortic valve stenosis. Aortic valve mean gradient measures 4.0 mmHg. Aortic valve peak gradient measures 7.2 mmHg. Aortic valve area, by VTI measures 2.46 cm. Pulmonic Valve: The pulmonic valve was grossly normal. Pulmonic valve regurgitation is not visualized. Pulmonic regurgitation is not visualized. Aorta: The aortic root and ascending aorta are structurally normal, with no evidence of dilitation. IAS/Shunts: The atrial septum is grossly normal.  LEFT VENTRICLE PLAX 2D LVIDd:         3.90 cm  Diastology LVIDs:         2.20 cm  LV e' lateral:   6.53 cm/s LV PW:         1.40 cm  LV E/e' lateral: 9.3 LV IVS:  1.90 cm  LV e' medial:    5.87 cm/s LVOT diam:     2.00 cm  LV E/e' medial:  10.4 LV SV:         50 ml LV SV Index:   24.19 LVOT Area:     3.14 cm  RIGHT VENTRICLE RV S prime:     11.00 cm/s TAPSE (M-mode): 1.7 cm LEFT ATRIUM           Index       RIGHT ATRIUM           Index LA diam:      2.70 cm 1.34 cm/m  RA Area:     15.00 cm LA Vol (A2C): 65.5 ml 32.55 ml/m RA Volume:   36.00 ml  17.89 ml/m LA Vol (A4C): 32.6 ml 16.20 ml/m  AORTIC VALVE AV Area (Vmax):    2.67 cm AV Area (Vmean):   2.63 cm AV Area (VTI):     2.46 cm AV Vmax:           134.00 cm/s AV Vmean:          91.000 cm/s AV  VTI:            0.290 m AV Peak Grad:      7.2 mmHg AV Mean Grad:      4.0 mmHg LVOT Vmax:         114.00 cm/s LVOT Vmean:        76.100 cm/s LVOT VTI:          0.227 m LVOT/AV VTI ratio: 0.78  AORTA Ao Root diam: 3.40 cm Ao Asc diam:  2.90 cm MITRAL VALVE MV Area (PHT): 2.66 cm             SHUNTS MV PHT:        82.65 msec           Systemic VTI:  0.23 m MV Decel Time: 285 msec             Systemic Diam: 2.00 cm MV E velocity: 61.00 cm/s 103 cm/s MV A velocity: 84.40 cm/s 70.3 cm/s MV E/A ratio:  0.72       1.5  Mertie Moores MD Electronically signed by Mertie Moores MD Signature Date/Time: 08/27/2019/12:29:31 PM    Final      Time Spent in minutes  30     Desiree Hane M.D on 08/27/2019 at 4:44 PM  To page go to [email or web address] - password [facility]

## 2019-08-27 NOTE — ED Notes (Signed)
ED TO INPATIENT HANDOFF REPORT  ED Nurse Name and Phone #:  Lowen Mansouri [phone] S Name/Age/Gender [name] 63 y.o. male Room/Bed: 029C/029C  Code Status   Code Status: Full Code  Home/SNF/Other Home Patient oriented to: self, place and situation Is this baseline? No   Triage Complete: Triage complete  Chief Complaint Stroke [facility]) [I63.9]  Triage Note Reported AMS around 1700 per wife; wanders around and fall x 3; along w/ loose BM; reported prior stroke 3 years ago. Residual slurred speech and facial droop;     Allergies Allergies  Allergen Reactions  . Shellfish Allergy Itching    Level of Care/Admitting Diagnosis ED Disposition    ED Disposition Condition Comment   Admit  Hospital Area: [place] [[id number]]  Level of Care: Telemetry Medical [[id number]]  Covid Evaluation: Asymptomatic Screening Protocol (No Symptoms)  [facility]: [id number]  Admitting Physician: Jani Gravel [[id number]]  Attending Physician: Jani Gravel [phone]  Estimated length of stay: past midnight tomorrow  Certification:: I certify this patient will need inpatient services for at least 2 midnights       B Medical/Surgery History Past Medical History:  Diagnosis Date  . Anginal pain ([place])   . Heart murmur   . Hypertension   . Psoriasis    "back and stomach" (02/03/2013)  . Stroke ([place]) 02/01/2013   left frontal CVA and symptoms of right arm and leg weakness and expressive aphasia/notes 02/03/2013 (02/03/2013)   Past Surgical History:  Procedure Laterality Date  . NO PAST SURGERIES    . TEE WITHOUT CARDIOVERSION N/A 02/09/2013   Procedure: TRANSESOPHAGEAL ECHOCARDIOGRAM (TEE);  Surgeon: Lelon Perla, MD;  Location: [facility] ENDOSCOPY;  Service: Cardiovascular;  Laterality: N/A;     A IV Location/Drains/Wounds Patient Lines/Drains/Airways Status   Active Line/Drains/Airways    Name:   Placement date:   Placement time:   Site:   Days:   Peripheral IV 08/27/19  Right;Anterior Forearm   08/27/19    0845    Forearm   less than 1          Intake/Output Last 24 hours No intake or output data in the 24 hours ending 08/27/19 1342  Labs/Imaging Results for orders placed or performed during the hospital encounter of 08/26/19 (from the past 48 hour(s))  CBG monitoring, ED     Status: Abnormal   Collection Time: 08/26/19  7:26 PM  Result Value Ref Range   Glucose-Capillary 120 (H) 70 - 99 mg/dL  Urinalysis     Status: Abnormal   Collection Time: 08/26/19  7:33 PM  Result Value Ref Range   Color, Urine YELLOW YELLOW   APPearance CLEAR CLEAR   Specific Gravity, Urine 1.024 1.005 - 1.030   pH 5.0 5.0 - 8.0   Glucose, UA NEGATIVE NEGATIVE mg/dL   Hgb urine dipstick NEGATIVE NEGATIVE   Bilirubin Urine NEGATIVE NEGATIVE   Ketones, ur 20 (A) NEGATIVE mg/dL   Protein, ur NEGATIVE NEGATIVE mg/dL   Nitrite NEGATIVE NEGATIVE   Leukocytes,Ua NEGATIVE NEGATIVE    Comment: Performed at [facility] [address] [place] [address]  CBC     Status: Abnormal   Collection Time: 08/26/19  8:47 PM  Result Value Ref Range   WBC 8.5 4.0 - 10.5 K/uL   RBC 6.19 (H) 4.22 - 5.81 MIL/uL   Hemoglobin 17.0 13.0 - 17.0 g/dL   HCT 52.3 (H) 39.0 - 52.0 %   MCV 84.5 80.0 - 100.0 fL  MCH 27.5 26.0 - 34.0 pg   MCHC 32.5 30.0 - 36.0 g/dL   RDW 14.5 11.5 - 15.5 %   Platelets 265 150 - 400 K/uL   nRBC 0.0 0.0 - 0.2 %    Comment: Performed at [facility], [place] [address] [place], [place] [address]  CMP     Status: Abnormal   Collection Time: 08/26/19  8:47 PM  Result Value Ref Range   Sodium 140 135 - 145 mmol/L   Potassium 3.5 3.5 - 5.1 mmol/L   Chloride 106 98 - 111 mmol/L   CO2 23 22 - 32 mmol/L   Glucose, Bld 100 (H) 70 - 99 mg/dL   BUN 9 8 - 23 mg/dL   Creatinine, Ser 1.32 (H) 0.61 - 1.24 mg/dL   Calcium 9.2 8.9 - 10.3 mg/dL   Total Protein 7.4 6.5 - 8.1 g/dL   Albumin 3.9 3.5 - 5.0 g/dL   AST 16 15 - 41 U/L   ALT 25 0 - 44 U/L    Alkaline Phosphatase 140 (H) 38 - 126 U/L   Total Bilirubin 1.1 0.3 - 1.2 mg/dL   GFR calc non Af Amer 57 (L) >60 mL/min   GFR calc Af Amer >60 >60 mL/min   Anion gap 11 5 - 15    Comment: Performed at [facility] [address] [place], [place] [address]  Lactic acid     Status: Abnormal   Collection Time: 08/26/19  8:47 PM  Result Value Ref Range   Lactic Acid, Venous 2.0 (HH) 0.5 - 1.9 mmol/L    Comment: CRITICAL RESULT CALLED TO, READ BACK BY AND VERIFIED WITH: L CHILTON,RN 2126 08/26/2019 WBOND Performed at [facility], [place] [address], [place], [place] [address]   Ethanol     Status: None   Collection Time: 08/26/19  8:47 PM  Result Value Ref Range   Alcohol, Ethyl (B) <10 <10 mg/dL    Comment: (NOTE) Lowest detectable limit for serum alcohol is 10 mg/dL. For medical purposes only. Performed at [facility], [place] [address], [place], [place] [address]   SARS CORONAVIRUS 2 (TAT 6-24 HRS) Nasopharyngeal Nasopharyngeal Swab     Status: None   Collection Time: 08/26/19  9:54 PM   Specimen: Nasopharyngeal Swab  Result Value Ref Range   SARS Coronavirus 2 NEGATIVE NEGATIVE    Comment: (NOTE) SARS-CoV-2 target nucleic acids are NOT DETECTED. The SARS-CoV-2 RNA is generally detectable in upper and lower respiratory specimens during the acute phase of infection. Negative results do not preclude SARS-CoV-2 infection, do not rule out co-infections with other pathogens, and should not be used as the sole basis for treatment or other patient management decisions. Negative results must be combined with clinical observations, patient history, and epidemiological information. The expected result is Negative. Fact Sheet for Patients: SugarRoll.be Fact Sheet for Healthcare Providers: https://www.woods-mathews.com/ This test is not yet approved or cleared by the [place] FDA and  has been authorized for detection and/or  diagnosis of SARS-CoV-2 by FDA under an Emergency Use Authorization (EUA). This EUA will remain  in effect (meaning this test can be used) for the duration of the COVID-19 declaration under Section 56 4(b)(1) of the Act, 21 U.S.C. section 360bbb-3(b)(1), unless the authorization is terminated or revoked sooner. Performed at [facility], [place] [address] [place], [place] [address]   Rapid urine drug screen (hospital performed)     Status: Abnormal  Collection Time: 08/27/19 12:29 AM  Result Value Ref Range   Opiates NONE DETECTED NONE DETECTED   Cocaine POSITIVE (A) NONE DETECTED   Benzodiazepines NONE DETECTED NONE DETECTED   Amphetamines NONE DETECTED NONE DETECTED   Tetrahydrocannabinol NONE DETECTED NONE DETECTED   Barbiturates NONE DETECTED NONE DETECTED    Comment: (NOTE) DRUG SCREEN FOR MEDICAL PURPOSES ONLY.  IF CONFIRMATION IS NEEDED FOR ANY PURPOSE, NOTIFY LAB WITHIN 5 DAYS. LOWEST DETECTABLE LIMITS FOR URINE DRUG SCREEN Drug Class                     Cutoff (ng/mL) Amphetamine and metabolites    1000 Barbiturate and metabolites    200 Benzodiazepine                 [id number] Tricyclics and metabolites     300 Opiates and metabolites        300 Cocaine and metabolites        300 THC                            50 Performed at [facility], [place] [address], [place], [place] [address]   Ammonia     Status: Abnormal   Collection Time: 08/27/19 12:33 AM  Result Value Ref Range   Ammonia 47 (H) 9 - 35 umol/L    Comment: Performed at [facility], [place] [address], [place], [place] [address]  HIV Antibody (routine testing w rflx)     Status: None   Collection Time: 08/27/19  8:46 AM  Result Value Ref Range   HIV Screen 4th Generation wRfx NON REACTIVE NON REACTIVE    Comment: Performed at [facility] [address] [place], [place] [address]  Hemoglobin A1c     Status: Abnormal   Collection Time: 08/27/19  8:46 AM  Result Value Ref Range   Hgb A1c  MFr Bld 6.2 (H) 4.8 - 5.6 %    Comment: (NOTE) Pre diabetes:          5.7%-6.4% Diabetes:              >6.4% Glycemic control for   <7.0% adults with diabetes    Mean Plasma Glucose 131.24 mg/dL    Comment: Performed at [facility] [address] [place], [place] [address]  Lipid panel     Status: Abnormal   Collection Time: 08/27/19  8:46 AM  Result Value Ref Range   Cholesterol 167 0 - 200 mg/dL   Triglycerides 98 <150 mg/dL   HDL 40 (L) >40 mg/dL   Total CHOL/HDL Ratio 4.2 RATIO   VLDL 20 0 - 40 mg/dL   LDL Cholesterol 107 (H) 0 - 99 mg/dL    Comment:        Total Cholesterol/HDL:CHD Risk Coronary Heart Disease Risk Table                     Men   Women  1/2 Average Risk   3.4   3.3  Average Risk       5.0   4.4  2 X Average Risk   9.6   7.1  3 X Average Risk  23.4   11.0        Use the calculated Patient Ratio above and the CHD Risk Table to determine the patient's CHD Risk.        ATP  III CLASSIFICATION (LDL):  <100     mg/dL   Optimal  100-129  mg/dL   Near or Above                    Optimal  130-159  mg/dL   Borderline  160-189  mg/dL   High  >190     mg/dL   Very High Performed at [facility] [address] [place], [place] [address]   CBC     Status: Abnormal   Collection Time: 08/27/19  8:46 AM  Result Value Ref Range   WBC 8.3 4.0 - 10.5 K/uL   RBC 5.91 (H) 4.22 - 5.81 MIL/uL   Hemoglobin 15.9 13.0 - 17.0 g/dL   HCT 49.5 39.0 - 52.0 %   MCV 83.8 80.0 - 100.0 fL   MCH 26.9 26.0 - 34.0 pg   MCHC 32.1 30.0 - 36.0 g/dL   RDW 14.3 11.5 - 15.5 %   Platelets 281 150 - 400 K/uL   nRBC 0.0 0.0 - 0.2 %    Comment: Performed at [facility], [place] [address], [place], [place] [address]  Comprehensive metabolic panel     Status: Abnormal   Collection Time: 08/27/19  8:46 AM  Result Value Ref Range   Sodium 141 135 - 145 mmol/L   Potassium 3.5 3.5 - 5.1 mmol/L   Chloride 105 98 - 111 mmol/L   CO2 25 22 - 32 mmol/L   Glucose, Bld 100  (H) 70 - 99 mg/dL   BUN 7 (L) 8 - 23 mg/dL   Creatinine, Ser 1.30 (H) 0.61 - 1.24 mg/dL   Calcium 9.1 8.9 - 10.3 mg/dL   Total Protein 7.1 6.5 - 8.1 g/dL   Albumin 3.8 3.5 - 5.0 g/dL   AST 20 15 - 41 U/L   ALT 24 0 - 44 U/L   Alkaline Phosphatase 140 (H) 38 - 126 U/L   Total Bilirubin 1.9 (H) 0.3 - 1.2 mg/dL   GFR calc non Af Amer 58 (L) >60 mL/min   GFR calc Af Amer >60 >60 mL/min   Anion gap 11 5 - 15    Comment: Performed at [facility], [place] [address], [place], [place] [address]  Gamma GT     Status: None   Collection Time: 08/27/19  8:46 AM  Result Value Ref Range   GGT 24 7 - 50 U/L    Comment: Performed at [facility], [place] [address], [place],  [address]   CT ANGIO HEAD W OR WO CONTRAST  Result Date: 08/27/2019 CLINICAL DATA:  Stroke follow-up. Known right PCA stroke. EXAM: CT ANGIOGRAPHY HEAD AND NECK TECHNIQUE: Multidetector CT imaging of the head and neck was performed using the standard protocol during bolus administration of intravenous contrast. Multiplanar CT image reconstructions and MIPs were obtained to evaluate the vascular anatomy. Carotid stenosis measurements (when applicable) are obtained utilizing NASCET criteria, using the distal internal carotid diameter as the denominator. CONTRAST:  42mL OMNIPAQUE IOHEXOL 350 MG/ML SOLN COMPARISON:  None. FINDINGS: CTA NECK FINDINGS SKELETON: There is no bony spinal canal stenosis. No lytic or blastic lesion. 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 UPPER CHEST: No pneumothorax or pleural effusion. No nodules or masses. AORTIC ARCH: There is mild calcific atherosclerosis of the aortic arch. There is no aneurysm, dissection or hemodynamically significant stenosis of the visualized portion  of the aorta. Conventional 3 vessel aortic branching pattern. The visualized proximal subclavian arteries are widely patent. RIGHT CAROTID SYSTEM: Normal without  aneurysm, dissection or stenosis. LEFT CAROTID SYSTEM: Normal without aneurysm, dissection or stenosis. VERTEBRAL ARTERIES: Right dominant configuration. Both origins are clearly patent. There is no dissection, occlusion or flow-limiting stenosis to the skull base (V1-V3 segments). CTA HEAD FINDINGS POSTERIOR CIRCULATION: --Vertebral arteries: Normal V4 segments. --Posterior inferior cerebellar arteries (PICA): Patent origins from the vertebral arteries. --Anterior inferior cerebellar arteries (AICA): Patent origins from the basilar artery. --Basilar artery: Normal. --Superior cerebellar arteries: Normal. --Posterior cerebral arteries: The right PCA is occluded at the P1 P2 junction. ANTERIOR CIRCULATION: --Intracranial internal carotid arteries: Atherosclerotic calcification of the internal carotid arteries at the skull base without hemodynamically significant stenosis. --Anterior cerebral arteries (ACA): Normal. Absent right A1 segment, normal variant --Middle cerebral arteries (MCA): The M1 segments are normal. There is multifocal severe stenosis of the M2 segments bilaterally. VENOUS SINUSES: As permitted by contrast timing, patent. ANATOMIC VARIANTS: None Review of the MIP images confirms the above findings. IMPRESSION: 1. 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 2. Bilateral multifocal severe M2 segment stenoses. 3. 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 Histologic sampling should be considered due to the degree of overlap of imaging features between benign and malignant parotid tumors. 4. Aortic Atherosclerosis (ICD10-I70.0). Electronically Signed   By: Ulyses Jarred M.D.   On: 08/27/2019 03:34   CT Head Wo Contrast  Result Date: 08/27/2019 CLINICAL DATA:  History of cocaine abuse.  Status post fall. EXAM: CT HEAD WITHOUT CONTRAST TECHNIQUE: Contiguous axial images were obtained from the  base of the skull through the vertex without intravenous contrast. COMPARISON:  Brain MRI 08/27/2019.  Head CT scan 08/26/2019. FINDINGS: Brain: No evidence of hemorrhage, hydrocephalus, extra-axial collection or mass lesion/mass effect. Acute right PCA territory infarct without hemorrhagic transformation is again seen. Atrophy, chronic microvascular ischemic change, remote left ACA infarct and remote bilateral cerebellar infarcts, larger on the right also again seen. Vascular: Atherosclerosis. Skull: Intact.  No focal lesion. Sinuses/Orbits: Negative. Other: None. IMPRESSION: Acute right PCA territory infarct is identified as seen on the comparison examinations. Negative for hemorrhagic transformation. Atrophy, chronic microvascular ischemic change and remote infarcts as described above. Electronically Signed   By: Inge Rise M.D.   On: 08/27/2019 12:54   CT Head  Result Date: 08/26/2019 CLINICAL DATA:  Status post fall, altered mental status. EXAM: CT HEAD WITHOUT CONTRAST TECHNIQUE: Contiguous axial images were obtained from the base of the skull through the vertex without intravenous contrast. COMPARISON:  Feb 03, 2013 FINDINGS: Brain: There is no midline shift, hydrocephalus, or mass. No acute hemorrhage is identified. There is lucency identified in the right cerebellum, likely due to an infarct, age indeterminate. Old infarct is identified in the left cerebellum. Encephalomalacia of the left frontal lobe is identified consistent with old infarct. Small old lacunar infarctions are identified in bilateral basal ganglia. Vascular: No hyperdense vessel is noted. Skull: Normal. Negative for fracture or focal lesion. Sinuses/Orbits: No acute finding. Other: None. IMPRESSION: 1. There is lucency identified in the right cerebellum, likely due to an infarct, age indeterminate. 2. Old infarcts identified in the left frontal lobe, left cerebellum, and bilateral basal ganglia. Electronically Signed   By:  Abelardo Diesel M.D.   On: 08/26/2019 20:05   CT ANGIO NECK W OR WO CONTRAST  Result Date: 08/27/2019 CLINICAL DATA:  Stroke follow-up. Known right PCA stroke. EXAM: CT ANGIOGRAPHY HEAD AND NECK TECHNIQUE: Multidetector CT imaging of the head and neck was performed using the standard protocol during bolus administration of intravenous contrast. Multiplanar CT image reconstructions and MIPs were obtained to evaluate the vascular anatomy. Carotid stenosis measurements (when applicable) are obtained utilizing NASCET criteria, using the distal internal carotid diameter as the denominator. CONTRAST:  69mL OMNIPAQUE IOHEXOL 350 MG/ML SOLN COMPARISON:  None. FINDINGS: CTA NECK FINDINGS SKELETON: There is no bony spinal canal stenosis. No lytic or blastic lesion. 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 UPPER CHEST: No pneumothorax or pleural effusion. No nodules or masses. AORTIC ARCH: There is mild calcific atherosclerosis of the aortic arch. There is no aneurysm, dissection or hemodynamically significant stenosis of the visualized portion of the aorta. Conventional 3 vessel aortic branching pattern. The visualized proximal subclavian arteries are widely patent. RIGHT CAROTID SYSTEM: Normal without aneurysm, dissection or stenosis. LEFT CAROTID SYSTEM: Normal without aneurysm, dissection or stenosis. VERTEBRAL ARTERIES: Right dominant configuration. Both origins are clearly patent. There is no dissection, occlusion or flow-limiting stenosis to the skull base (V1-V3 segments). CTA HEAD FINDINGS POSTERIOR CIRCULATION: --Vertebral arteries: Normal V4 segments. --Posterior inferior cerebellar arteries (PICA): Patent origins from the vertebral arteries. --Anterior inferior cerebellar arteries (AICA): Patent origins from the basilar artery. --Basilar artery: Normal. --Superior cerebellar arteries: Normal. --Posterior cerebral arteries: The right PCA is occluded at  the P1 P2 junction. ANTERIOR CIRCULATION: --Intracranial internal carotid arteries: Atherosclerotic calcification of the internal carotid arteries at the skull base without hemodynamically significant stenosis. --Anterior cerebral arteries (ACA): Normal. Absent right A1 segment, normal variant --Middle cerebral arteries (MCA): The M1 segments are normal. There is multifocal severe stenosis of the M2 segments bilaterally. VENOUS SINUSES: As permitted by contrast timing, patent. ANATOMIC VARIANTS: None Review of the MIP images confirms the above findings. IMPRESSION: 1. 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 2. Bilateral multifocal severe M2 segment stenoses. 3. 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 Histologic sampling should be considered due to the degree of overlap of imaging features between benign and malignant parotid tumors. 4. Aortic Atherosclerosis (ICD10-I70.0). Electronically Signed   By: Ulyses Jarred M.D.   On: 08/27/2019 03:34   MR BRAIN WO CONTRAST  Result Date: 08/27/2019 CLINICAL DATA:  Encephalopathy EXAM: MRI HEAD WITHOUT CONTRAST TECHNIQUE: Multiplanar, multiecho pulse sequences of the brain and surrounding structures were obtained without intravenous contrast. COMPARISON:  Brain MRI 02/03/2013 and head CT 08/26/2019 FINDINGS: Brain: There is a large right PCA territory infarct predominantly affecting the medial temporal lobe and occipital lobe with smaller foci of ischemia in the right thalamus and right middle cerebellar peduncle. There is a large area of encephalomalacia within the left anterior cerebral artery territory. There is diffuse confluent white matter hyperintensity compatible with ischemic microangiopathy. There are old bilateral cerebellar infarcts. Vascular: There is loss of the normal right PCA flow void. There are 10-20 scattered foci of chronic  microhemorrhage in a predominantly peripheral distribution. No acute hemorrhage. Skull and upper cervical spine: The bone marrow signal of the cranium and upper cervical vertebrae is normal. There is no skull base lesion. The visualized upper cervical spinal cord is normal. Sinuses/Orbits: There is no paranasal sinus fluid level or advanced mucosal thickening. There is no mastoid  or middle ear effusion. The orbits are normal. Other: None IMPRESSION: 1. Large, acute right PCA territory infarct involving the medial temporal lobe and occipital lobe with additional smaller foci of acute ischemia within the right thalamus and right middle cerebellar peduncle. 2. Loss of the normal right PCA flow void consistent with occlusion. 3. Multiple foci of chronic microhemorrhage in a predominantly peripheral distribution. 4. No acute hemorrhage or mass effect. Electronically Signed   By: Ulyses Jarred M.D.   On: 08/27/2019 02:13   XR Chest Single View  Result Date: 08/26/2019 CLINICAL DATA:  Altered behavior, confusion and falls today EXAM: PORTABLE CHEST 1 VIEW COMPARISON:  Radiograph Feb 03, 2013 FINDINGS: Nonspecific elevation of the right hemidiaphragm with adjacent opacity likely reflecting atelectasis. No consolidation, convincing features of edema, pneumothorax or effusion. The aorta is calcified. The remaining cardiomediastinal contours are unremarkable. Degenerative changes are present in the imaged spine and shoulders. No acute osseous or soft tissue abnormality. IMPRESSION: 1. Nonspecific elevation of the right hemidiaphragm with adjacent opacity likely reflecting atelectasis. 2.  No other acute cardiopulmonary abnormality. Electronically Signed   By: Lovena Le M.D.   On: 08/26/2019 20:30   ECHOCARDIOGRAM COMPLETE  Result Date: 08/27/2019   ECHOCARDIOGRAM REPORT   Patient Name:   [name] Date of Exam: 08/27/2019 Medical Rec #:  [id number]     Height:       69.0 in Accession #:    [id number]    Weight:        188.0 lb Date of Birth:  06/13/1956     BSA:          2.01 m Patient Age:    55 years      BP:           132/101 mmHg Patient Gender: M             HR:           61 bpm. Exam Location:  Inpatient Procedure: 2D Echo Indications:    Stroke 434.91/I163.9  History:        Patient has prior history of Echocardiogram examinations, most                 recent 02/09/2013. Stroke; Risk Factors:Hypertension.  Sonographer:    Clayton Lefort RDCS (AE) Referring Phys: [id number] Jani Gravel  Sonographer Comments: Patient unable to hold breath when requested. IMPRESSIONS  1. Left ventricular ejection fraction, by visual estimation, is 65 to 70%. The left ventricle has normal function. There is severely increased left ventricular hypertrophy.  2. Left ventricular diastolic parameters are consistent with Grade I diastolic dysfunction (impaired relaxation).  3. The left ventricle has no regional wall motion abnormalities.  4. Global right ventricle has normal systolic function.The right ventricular size is normal. No increase in right ventricular wall thickness.  5. Left atrial size was normal.  6. Right atrial size was normal.  7. The mitral valve is normal in structure. Trivial mitral valve regurgitation.  8. The tricuspid valve is normal in structure. Tricuspid valve regurgitation is not demonstrated.  9. The aortic valve is normal in structure. Aortic valve regurgitation is not visualized. Mild aortic valve sclerosis without stenosis. 10. The pulmonic valve was grossly normal. Pulmonic valve regurgitation is not visualized. 11. The atrial septum is grossly normal. FINDINGS  Left Ventricle: Left ventricular ejection fraction, by visual estimation, is 65 to 70%. The left ventricle has normal function. The left ventricle has no regional wall motion abnormalities. There is severely  increased left ventricular hypertrophy. Asymmetric left ventricular hypertrophy. Left ventricular diastolic parameters are consistent with Grade I diastolic  dysfunction (impaired relaxation). Right Ventricle: The right ventricular size is normal. No increase in right ventricular wall thickness. Global RV systolic function is has normal systolic function. Left Atrium: Left atrial size was normal in size. Right Atrium: Right atrial size was normal in size Pericardium: There is no evidence of pericardial effusion. Mitral Valve: The mitral valve is normal in structure. Trivial mitral valve regurgitation. Tricuspid Valve: The tricuspid valve is normal in structure. Tricuspid valve regurgitation is not demonstrated. Aortic Valve: The aortic valve is normal in structure. Aortic valve regurgitation is not visualized. Mild aortic valve sclerosis is present, with no evidence of aortic valve stenosis. Aortic valve mean gradient measures 4.0 mmHg. Aortic valve peak gradient measures 7.2 mmHg. Aortic valve area, by VTI measures 2.46 cm. Pulmonic Valve: The pulmonic valve was grossly normal. Pulmonic valve regurgitation is not visualized. Pulmonic regurgitation is not visualized. Aorta: The aortic root and ascending aorta are structurally normal, with no evidence of dilitation. IAS/Shunts: The atrial septum is grossly normal.  LEFT VENTRICLE PLAX 2D LVIDd:         3.90 cm  Diastology LVIDs:         2.20 cm  LV e' lateral:   6.53 cm/s LV PW:         1.40 cm  LV E/e' lateral: 9.3 LV IVS:        1.90 cm  LV e' medial:    5.87 cm/s LVOT diam:     2.00 cm  LV E/e' medial:  10.4 LV SV:         50 ml LV SV Index:   24.19 LVOT Area:     3.14 cm  RIGHT VENTRICLE RV S prime:     11.00 cm/s TAPSE (M-mode): 1.7 cm LEFT ATRIUM           Index       RIGHT ATRIUM           Index LA diam:      2.70 cm 1.34 cm/m  RA Area:     15.00 cm LA Vol (A2C): 65.5 ml 32.55 ml/m RA Volume:   36.00 ml  17.89 ml/m LA Vol (A4C): 32.6 ml 16.20 ml/m  AORTIC VALVE AV Area (Vmax):    2.67 cm AV Area (Vmean):   2.63 cm AV Area (VTI):     2.46 cm AV Vmax:           134.00 cm/s AV Vmean:          91.000 cm/s AV  VTI:            0.290 m AV Peak Grad:      7.2 mmHg AV Mean Grad:      4.0 mmHg LVOT Vmax:         114.00 cm/s LVOT Vmean:        76.100 cm/s LVOT VTI:          0.227 m LVOT/AV VTI ratio: 0.78  AORTA Ao Root diam: 3.40 cm Ao Asc diam:  2.90 cm MITRAL VALVE MV Area (PHT): 2.66 cm             SHUNTS MV PHT:        82.65 msec           Systemic VTI:  0.23 m MV Decel Time: 285 msec  Systemic Diam: 2.00 cm MV E velocity: 61.00 cm/s 103 cm/s MV A velocity: 84.40 cm/s 70.3 cm/s MV E/A ratio:  0.72       1.5  Mertie Moores MD Electronically signed by Mertie Moores MD Signature Date/Time: 08/27/2019/12:29:31 PM    Final     Pending Labs Unresulted Labs (From admission, onward)   None      Vitals/Pain Today's Vitals   08/27/19 1145 08/27/19 1200 08/27/19 1309 08/27/19 1335  BP: (!) 193/101 (!) 192/115 (!) 195/95 (!) 185/97  Pulse: 80  77   Resp: 13 13 18  (!) 29  Temp:      TempSrc:      SpO2: 98%  99%   PainSc:        Isolation Precautions No active isolations  Medications Medications  labetalol (NORMODYNE) injection 10 mg (has no administration in time range)   stroke: mapping our early stages of recovery book (has no administration in time range)  acetaminophen (TYLENOL) tablet 650 mg (has no administration in time range)    Or  acetaminophen (TYLENOL) 160 MG/5ML solution 650 mg (has no administration in time range)    Or  acetaminophen (TYLENOL) suppository 650 mg (has no administration in time range)  aspirin suppository 300 mg ( Rectal See Alternative 08/27/19 1106)    Or  aspirin tablet 325 mg (325 mg Oral Given 08/27/19 1106)  atorvastatin (LIPITOR) tablet 80 mg (has no administration in time range)  hydrochlorothiazide (HYDRODIURIL) tablet 25 mg (25 mg Oral Given 08/27/19 1107)  lisinopril (ZESTRIL) tablet 10 mg (10 mg Oral Given 08/27/19 1107)  sodium chloride 0.9 % bolus 500 mL (0 mLs Intravenous Stopped 08/26/19 2133)  labetalol (NORMODYNE) injection 10 mg (10 mg  Intravenous Given 08/26/19 2056)  haloperidol lactate (HALDOL) injection 2 mg (2 mg Intravenous Given 08/26/19 2308)  diazepam (VALIUM) injection 5 mg (5 mg Intravenous Given 08/27/19 0118)  iohexol (OMNIPAQUE) 350 MG/ML injection 75 mL (75 mLs Intravenous Contrast Given 08/27/19 0316)    Mobility walks with person assist High fall risk   Focused Assessments Neuro Assessment Handoff:  Swallow screen pass? Yes  Cardiac Rhythm: Normal sinus rhythm NIH Stroke Scale ( + Modified Stroke Scale Criteria)  Interval: Initial Level of Consciousness (1a.)   : Alert, keenly responsive LOC Questions (1b. )   +: Answers one question correctly LOC Commands (1c. )   + : Performs both tasks correctly Best Gaze (2. )  +: Normal Visual (3. )  +: No visual loss Facial Palsy (4. )    : Minor paralysis(Reported hx of stroke and could be residual symptoms) Motor Arm, Left (5a. )   +: No drift Motor Arm, Right (5b. )   +: Drift Motor Leg, Left (6a. )   +: No drift Motor Leg, Right (6b. )   +: Drift Limb Ataxia (7. ): Present in one limb Sensory (8. )   +: Mild-to-moderate sensory loss, patient feels pinprick is less sharp or is dull on the affected side, or there is a loss of superficial pain with pinprick, but patient is aware of being touched Best Language (9. )   +: No aphasia Dysarthria (10. ): Mild-to-moderate dysarthria, patient slurs at least some words and, at worst, can be understood with some difficulty(Reported hx of stroke and could be residual symptoms) Extinction/Inattention (11.)   +: No Abnormality Modified SS Total  +: 4 Complete NIHSS TOTAL: 8 Last date known well: 08/26/19 Last time known well: 1700 Neuro Assessment: Exceptions  to [facility] Neuro Checks:   Initial (08/26/19 1925)  Last Documented NIHSS Modified Score: 4 (08/27/19 1215) Has TPA been given? No If patient is a Neuro Trauma and patient is going to OR before floor call report to [place] nurse: [phone] or  [phone]     R Recommendations: See Admitting Provider Note  Report given to:   Additional Notes:

## 2019-08-27 NOTE — ED Notes (Signed)
Family at bedside. 

## 2019-08-27 NOTE — ED Provider Notes (Signed)
I assumed care of this patient from Dr. Sedonia Small.  Please see their note for further details of Hx, PE.  Briefly patient is a 63 y.o. male who presented AMS pending MRI to assess for possible stroke.   MRI confirmed right PCA stroke. Will admit.       Fatima Blank, MD 08/27/19 [phone]

## 2019-08-27 NOTE — ED Notes (Addendum)
Patient back to unit from MRI; asleep but arousable.

## 2019-08-27 NOTE — H&P (Addendum)
TRH H&P    Patient Demographics:    [name], is a 63 y.o. male  MRN: [id number]  DOB - Jul 09, 1956  Admit Date - 08/26/2019  Referring MD/NP/PA: cardama  Outpatient Primary MD for the patient is Alroy Dust, L.[name], MD  Patient coming from:  home  Chief complaint-  Altered mental status, slurred speech, facial droop   HPI:    [name]  is a 63 y.o. male, w psoriasis, hypertension h/o stroke, presents with slurred speech, facial droop. Unclear exactly what time his symptoms started.  Pt states has not been taking his aspirin  In ED,   MRI brain IMPRESSION: 1. Large, acute right PCA territory infarct involving the medial temporal lobe and occipital lobe with additional smaller foci of acute ischemia within the right thalamus and right middle cerebellar peduncle. 2. Loss of the normal right PCA flow void consistent with occlusion. 3. Multiple foci of chronic microhemorrhage in a predominantly peripheral distribution. 4. No acute hemorrhage or mass effect.  CTA head/ neck IMPRESSION: 1. 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 2. Bilateral multifocal severe M2 segment stenoses. 3. 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 Histologic sampling should be considered due to the degree of overlap of imaging features between benign and malignant parotid tumors. 4. Aortic Atherosclerosis (ICD10-I70.0).  Wbc 8.5, Hgb 17.0, Plt 265 Na 140, K 3.5, Bun 9, Creatinine 1.32 Ast 16, Alt 25, Alk phos 140, T. Bili 1.1 Lactic acid 2.0 Ammonia 47  UDS Coccaine positve Urinalysis negative  ED consulted neurology, see recommendations  Pt will be admitted for acute stroke, and coccaine use, and alk phos elevation.    Review of systems:    In addition to the HPI above,  No  Fever-chills, No Headache, No changes with Vision or hearing, No problems swallowing food or Liquids, No Chest pain, Cough or Shortness of Breath, No Abdominal pain, No Nausea or Vomiting, bowel movements are regular, No Blood in stool or Urine, No dysuria, No new skin rashes or bruises, No new joints pains-aches,  No new weakness, tingling, numbness in any extremity, No recent weight gain or loss, No polyuria, polydypsia or polyphagia, No significant Mental Stressors.  All other systems reviewed and are negative.    Past History of the following :    Past Medical History:  Diagnosis Date  . Anginal pain ([place])   . Heart murmur   . Hypertension   . Psoriasis    "back and stomach" (02/03/2013)  . Stroke ([place]) 02/01/2013   left frontal CVA and symptoms of right arm and leg weakness and expressive aphasia/notes 02/03/2013 (02/03/2013)      Past Surgical History:  Procedure Laterality Date  . NO PAST SURGERIES    . TEE WITHOUT CARDIOVERSION N/A 02/09/2013   Procedure: TRANSESOPHAGEAL ECHOCARDIOGRAM (TEE);  Surgeon: Lelon Perla, MD;  Location[facility] ENDOSCOPY;  Service: Cardiovascular;  Laterality: N/A;      Social History:  Social History   Tobacco Use  . Smoking status: Former Smoker    Packs/day: 0.25    Years: 37.00    Pack years: 9.25    Types: Cigarettes    Quit date: 04/13/2013    Years since quitting: 6.3  . Smokeless tobacco: Never Used  . Tobacco comment: using patch to quit  Substance Use Topics  . Alcohol use: Yes    Alcohol/week: 17.0 standard drinks    Types: 17 Shots of liquor per week    Comment: 02/03/2013 "about 1/5th of grey goose/wk"       Family History :     Family History  Problem Relation Age of Onset  . Diabetes Mother        Home Medications:   Prior to Admission medications   Medication Sig Start Date End Date Taking? Authorizing Provider  aspirin 325 MG tablet Take 1 tablet (325 mg total) by mouth daily. 02/25/13    Angiulli, Lavon Paganini, PA-C  atorvastatin (LIPITOR) 10 MG tablet Take 1 tablet (10 mg total) by mouth daily at 6 PM. 02/25/13   Angiulli, Lavon Paganini, PA-C  clobetasol cream (TEMOVATE) 8.67 % Apply 1 application topically daily as needed (psoriasis).    [provider]  desonide (DESOWEN) 0.05 % cream Apply 1 application topically daily as needed (psoriasis).    [provider]  diclofenac sodium (VOLTAREN) 1 % GEL Apply 2 g topically 4 (four) times daily. 03/12/13   Kirsteins, Luanna Salk, MD  hydrochlorothiazide (HYDRODIURIL) 25 MG tablet  01/07/13   [provider]  lisinopril (PRINIVIL,ZESTRIL) 10 MG tablet Take 1 tablet (10 mg total) by mouth daily. 02/25/13   Angiulli, Lavon Paganini, PA-C     Allergies:     Allergies  Allergen Reactions  . Shellfish Allergy Itching     Physical Exam:   Vitals  Blood pressure (!) 195/111, pulse 91, temperature 98.6 F (37 C), temperature source Oral, resp. rate 20, SpO2 96 %.  1.  General: axoxo1  2. Psychiatric: euthymic  3. Neurologic: cn2-12 intact, reflexes 2+ symmetric, diffuse with no clonus, motor 5/5 in all 4 ext Unable to test finger to nose  4. HEENMT:  Anicteric, arcus senilis, pupils 1.79m symmetric, direct, consensual intact Neck: no jvd  5. Respiratory : CTAB  6. Cardiovascular : rrr s1, s2, no m/g/r  7. Gastrointestinal:  Abd: soft, nt, nd, +bs  8. Skin:  Ext: no c/c/e, no rash  9.Musculoskeletal:  Good ROM    Data Review:    CBC Recent Labs  Lab 08/26/19 2047  WBC 8.5  HGB 17.0  HCT 52.3*  PLT 265  MCV 84.5  MCH 27.5  MCHC 32.5  RDW 14.5   ------------------------------------------------------------------------------------------------------------------  Results for orders placed or performed during the hospital encounter of 08/26/19 (from the past 48 hour(s))  CBG monitoring, ED     Status: Abnormal   Collection Time: 08/26/19  7:26 PM  Result Value Ref Range   Glucose-Capillary  120 (H) 70 - 99 mg/dL  Urinalysis     Status: Abnormal   Collection Time: 08/26/19  7:33 PM  Result Value Ref Range   Color, Urine YELLOW YELLOW   APPearance CLEAR CLEAR   Specific Gravity, Urine 1.024 1.005 - 1.030   pH 5.0 5.0 - 8.0   Glucose, UA NEGATIVE NEGATIVE mg/dL   Hgb urine dipstick NEGATIVE NEGATIVE   Bilirubin Urine NEGATIVE NEGATIVE   Ketones, ur 20 (A) NEGATIVE mg/dL   Protein, ur NEGATIVE NEGATIVE  mg/dL   Nitrite NEGATIVE NEGATIVE   Leukocytes,Ua NEGATIVE NEGATIVE    Comment: Performed at [facility], [place] [address] [place], [place] [address]  CBC     Status: Abnormal   Collection Time: 08/26/19  8:47 PM  Result Value Ref Range   WBC 8.5 4.0 - 10.5 K/uL   RBC 6.19 (H) 4.22 - 5.81 MIL/uL   Hemoglobin 17.0 13.0 - 17.0 g/dL   HCT 52.3 (H) 39.0 - 52.0 %   MCV 84.5 80.0 - 100.0 fL   MCH 27.5 26.0 - 34.0 pg   MCHC 32.5 30.0 - 36.0 g/dL   RDW 14.5 11.5 - 15.5 %   Platelets 265 150 - 400 K/uL   nRBC 0.0 0.0 - 0.2 %    Comment: Performed at [facility], [place] [address] [place], [place] [address]  CMP     Status: Abnormal   Collection Time: 08/26/19  8:47 PM  Result Value Ref Range   Sodium 140 135 - 145 mmol/L   Potassium 3.5 3.5 - 5.1 mmol/L   Chloride 106 98 - 111 mmol/L   CO2 23 22 - 32 mmol/L   Glucose, Bld 100 (H) 70 - 99 mg/dL   BUN 9 8 - 23 mg/dL   Creatinine, Ser 1.32 (H) 0.61 - 1.24 mg/dL   Calcium 9.2 8.9 - 10.3 mg/dL   Total Protein 7.4 6.5 - 8.1 g/dL   Albumin 3.9 3.5 - 5.0 g/dL   AST 16 15 - 41 U/L   ALT 25 0 - 44 U/L   Alkaline Phosphatase 140 (H) 38 - 126 U/L   Total Bilirubin 1.1 0.3 - 1.2 mg/dL   GFR calc non Af Amer 57 (L) >60 mL/min   GFR calc Af Amer >60 >60 mL/min   Anion gap 11 5 - 15    Comment: Performed at [facility] [address], [place], [place] [address]  Lactic acid     Status: Abnormal   Collection Time: 08/26/19  8:47 PM  Result Value Ref Range   Lactic Acid, Venous 2.0 (HH) 0.5 - 1.9 mmol/L     Comment: CRITICAL RESULT CALLED TO, READ BACK BY AND VERIFIED WITH: L CHILTON,RN 2126 08/26/2019 WBOND Performed at [facility], [place] [address], [place], [place] [address]   Ethanol     Status: None   Collection Time: 08/26/19  8:47 PM  Result Value Ref Range   Alcohol, Ethyl (B) <10 <10 mg/dL    Comment: (NOTE) Lowest detectable limit for serum alcohol is 10 mg/dL. For medical purposes only. Performed at [facility], [place] [address] [place], [place] [address]   SARS CORONAVIRUS 2 (TAT 6-24 HRS) Nasopharyngeal Nasopharyngeal Swab     Status: None   Collection Time: 08/26/19  9:54 PM   Specimen: Nasopharyngeal Swab  Result Value Ref Range   SARS Coronavirus 2 NEGATIVE NEGATIVE    Comment: (NOTE) SARS-CoV-2 target nucleic acids are NOT DETECTED. The SARS-CoV-2 RNA is generally detectable in upper and lower respiratory specimens during the acute phase of infection. Negative results do not preclude SARS-CoV-2 infection, do not rule out co-infections with other pathogens, and should not be used as the sole basis for treatment or other patient management decisions. Negative results must be combined with clinical observations, patient history, and epidemiological information. The expected result is Negative. Fact Sheet for Patients: SugarRoll.be Fact Sheet for Healthcare Providers: https://www.woods-mathews.com/ This test is not yet approved or  cleared by the [place] and  has been authorized for detection and/or diagnosis of SARS-CoV-2 by FDA under an Emergency Use Authorization (EUA). This EUA will remain  in effect (meaning this test can be used) for the duration of the COVID-19 declaration under Section 56 4(b)(1) of the Act, 21 U.S.C. section 360bbb-3(b)(1), unless the authorization is terminated or revoked sooner. Performed at [facility], [place] [address], [place], [place] [address]   Rapid urine drug  screen (hospital performed)     Status: Abnormal   Collection Time: 08/27/19 12:29 AM  Result Value Ref Range   Opiates NONE DETECTED NONE DETECTED   Cocaine POSITIVE (A) NONE DETECTED   Benzodiazepines NONE DETECTED NONE DETECTED   Amphetamines NONE DETECTED NONE DETECTED   Tetrahydrocannabinol NONE DETECTED NONE DETECTED   Barbiturates NONE DETECTED NONE DETECTED    Comment: (NOTE) DRUG SCREEN FOR MEDICAL PURPOSES ONLY.  IF CONFIRMATION IS NEEDED FOR ANY PURPOSE, NOTIFY LAB WITHIN 5 DAYS. LOWEST DETECTABLE LIMITS FOR URINE DRUG SCREEN Drug Class                     Cutoff (ng/mL) Amphetamine and metabolites    1000 Barbiturate and metabolites    200 Benzodiazepine                 979 Tricyclics and metabolites     300 Opiates and metabolites        300 Cocaine and metabolites        300 THC                            50 Performed at [facility], [place] [address], [place], [place] [address]   Ammonia     Status: Abnormal   Collection Time: 08/27/19 12:33 AM  Result Value Ref Range   Ammonia 47 (H) 9 - 35 umol/L    Comment: Performed at [facility], [place] [address], [place], [place] [address]    Chemistries  Recent Labs  Lab 08/26/19 2047  NA 140  K 3.5  CL 106  CO2 23  GLUCOSE 100*  BUN 9  CREATININE 1.32*  CALCIUM 9.2  AST 16  ALT 25  ALKPHOS 140*  BILITOT 1.1   ------------------------------------------------------------------------------------------------------------------  ------------------------------------------------------------------------------------------------------------------ GFR: CrCl cannot be calculated (Unknown ideal weight.). Liver Function Tests: Recent Labs  Lab 08/26/19 2047  AST 16  ALT 25  ALKPHOS 140*  BILITOT 1.1  PROT 7.4  ALBUMIN 3.9   No results for input(s): LIPASE, AMYLASE in the last 168 hours. Recent Labs  Lab 08/27/19 0033  AMMONIA 47*   Coagulation Profile: No results for input(s): INR, PROTIME in  the last 168 hours. Cardiac Enzymes: No results for input(s): CKTOTAL, CKMB, CKMBINDEX, TROPONINI in the last 168 hours. BNP (last 3 results) No results for input(s): PROBNP in the last 8760 hours. HbA1C: No results for input(s): HGBA1C in the last 72 hours. CBG: Recent Labs  Lab 08/26/19 1926  GLUCAP 120*   Lipid Profile: No results for input(s): CHOL, HDL, LDLCALC, TRIG, CHOLHDL, LDLDIRECT in the last 72 hours. Thyroid Function Tests: No results for input(s): TSH, T4TOTAL, FREET4, T3FREE, THYROIDAB in the last 72 hours. Anemia Panel: No results for input(s): VITAMINB12, FOLATE, FERRITIN, TIBC, IRON, RETICCTPCT in the last 72 hours.  --------------------------------------------------------------------------------------------------------------- Urine analysis:    Component Value Date/Time   COLORURINE YELLOW 08/26/2019 [place]  08/26/2019 1933   LABSPEC 1.024 08/26/2019 1933   PHURINE 5.0 08/26/2019 1933   GLUCOSEU NEGATIVE 08/26/2019 [place] NEGATIVE 08/26/2019 [place] 08/26/2019 1933   KETONESUR 20 (A) 08/26/2019 1933   PROTEINUR NEGATIVE 08/26/2019 1933   UROBILINOGEN 0.2 02/04/2013 1315   NITRITE NEGATIVE 08/26/2019 1933   LEUKOCYTESUR NEGATIVE 08/26/2019 1933      Imaging Results:    CT ANGIO HEAD W OR WO CONTRAST  Result Date: 08/27/2019 CLINICAL DATA:  Stroke follow-up. Known right PCA stroke. EXAM: CT ANGIOGRAPHY HEAD AND NECK TECHNIQUE: Multidetector CT imaging of the head and neck was performed using the standard protocol during bolus administration of intravenous contrast. Multiplanar CT image reconstructions and MIPs were obtained to evaluate the vascular anatomy. Carotid stenosis measurements (when applicable) are obtained utilizing NASCET criteria, using the distal internal carotid diameter as the denominator. CONTRAST:  44m OMNIPAQUE IOHEXOL 350 MG/ML SOLN COMPARISON:  None. FINDINGS: CTA NECK FINDINGS SKELETON:  There is no bony spinal canal stenosis. No lytic or blastic lesion. 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 UPPER CHEST: No pneumothorax or pleural effusion. No nodules or masses. AORTIC ARCH: There is mild calcific atherosclerosis of the aortic arch. There is no aneurysm, dissection or hemodynamically significant stenosis of the visualized portion of the aorta. Conventional 3 vessel aortic branching pattern. The visualized proximal subclavian arteries are widely patent. RIGHT CAROTID SYSTEM: Normal without aneurysm, dissection or stenosis. LEFT CAROTID SYSTEM: Normal without aneurysm, dissection or stenosis. VERTEBRAL ARTERIES: Right dominant configuration. Both origins are clearly patent. There is no dissection, occlusion or flow-limiting stenosis to the skull base (V1-V3 segments). CTA HEAD FINDINGS POSTERIOR CIRCULATION: --Vertebral arteries: Normal V4 segments. --Posterior inferior cerebellar arteries (PICA): Patent origins from the vertebral arteries. --Anterior inferior cerebellar arteries (AICA): Patent origins from the basilar artery. --Basilar artery: Normal. --Superior cerebellar arteries: Normal. --Posterior cerebral arteries: The right PCA is occluded at the P1 P2 junction. ANTERIOR CIRCULATION: --Intracranial internal carotid arteries: Atherosclerotic calcification of the internal carotid arteries at the skull base without hemodynamically significant stenosis. --Anterior cerebral arteries (ACA): Normal. Absent right A1 segment, normal variant --Middle cerebral arteries (MCA): The M1 segments are normal. There is multifocal severe stenosis of the M2 segments bilaterally. VENOUS SINUSES: As permitted by contrast timing, patent. ANATOMIC VARIANTS: None Review of the MIP images confirms the above findings. IMPRESSION: 1. 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 2. Bilateral multifocal severe M2 segment stenoses. 3.  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 Histologic sampling should be considered due to the degree of overlap of imaging features between benign and malignant parotid tumors. 4. Aortic Atherosclerosis (ICD10-I70.0). Electronically Signed   By: KUlyses JarredM.D.   On: 08/27/2019 03:34   CT Head  Result Date: 08/26/2019 CLINICAL DATA:  Status post fall, altered mental status. EXAM: CT HEAD WITHOUT CONTRAST TECHNIQUE: Contiguous axial images were obtained from the base of the skull through the vertex without intravenous contrast. COMPARISON:  Feb 03, 2013 FINDINGS: Brain: There is no midline shift, hydrocephalus, or mass. No acute hemorrhage is identified. There is lucency identified in the right cerebellum, likely due to an infarct, age indeterminate. Old infarct is identified in the left cerebellum. Encephalomalacia of the left frontal lobe is identified consistent with old infarct. Small old lacunar infarctions are identified in  bilateral basal ganglia. Vascular: No hyperdense vessel is noted. Skull: Normal. Negative for fracture or focal lesion. Sinuses/Orbits: No acute finding. Other: None. IMPRESSION: 1. There is lucency identified in the right cerebellum, likely due to an infarct, age indeterminate. 2. Old infarcts identified in the left frontal lobe, left cerebellum, and bilateral basal ganglia. Electronically Signed   By: Abelardo Diesel M.D.   On: 08/26/2019 20:05   CT ANGIO NECK W OR WO CONTRAST  Result Date: 08/27/2019 CLINICAL DATA:  Stroke follow-up. Known right PCA stroke. EXAM: CT ANGIOGRAPHY HEAD AND NECK TECHNIQUE: Multidetector CT imaging of the head and neck was performed using the standard protocol during bolus administration of intravenous contrast. Multiplanar CT image reconstructions and MIPs were obtained to evaluate the vascular anatomy. Carotid stenosis measurements (when applicable) are obtained  utilizing NASCET criteria, using the distal internal carotid diameter as the denominator. CONTRAST:  42m OMNIPAQUE IOHEXOL 350 MG/ML SOLN COMPARISON:  None. FINDINGS: CTA NECK FINDINGS SKELETON: There is no bony spinal canal stenosis. No lytic or blastic lesion. 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 UPPER CHEST: No pneumothorax or pleural effusion. No nodules or masses. AORTIC ARCH: There is mild calcific atherosclerosis of the aortic arch. There is no aneurysm, dissection or hemodynamically significant stenosis of the visualized portion of the aorta. Conventional 3 vessel aortic branching pattern. The visualized proximal subclavian arteries are widely patent. RIGHT CAROTID SYSTEM: Normal without aneurysm, dissection or stenosis. LEFT CAROTID SYSTEM: Normal without aneurysm, dissection or stenosis. VERTEBRAL ARTERIES: Right dominant configuration. Both origins are clearly patent. There is no dissection, occlusion or flow-limiting stenosis to the skull base (V1-V3 segments). CTA HEAD FINDINGS POSTERIOR CIRCULATION: --Vertebral arteries: Normal V4 segments. --Posterior inferior cerebellar arteries (PICA): Patent origins from the vertebral arteries. --Anterior inferior cerebellar arteries (AICA): Patent origins from the basilar artery. --Basilar artery: Normal. --Superior cerebellar arteries: Normal. --Posterior cerebral arteries: The right PCA is occluded at the P1 P2 junction. ANTERIOR CIRCULATION: --Intracranial internal carotid arteries: Atherosclerotic calcification of the internal carotid arteries at the skull base without hemodynamically significant stenosis. --Anterior cerebral arteries (ACA): Normal. Absent right A1 segment, normal variant --Middle cerebral arteries (MCA): The M1 segments are normal. There is multifocal severe stenosis of the M2 segments bilaterally. VENOUS SINUSES: As permitted by contrast timing, patent. ANATOMIC VARIANTS: None  Review of the MIP images confirms the above findings. IMPRESSION: 1. 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 2. Bilateral multifocal severe M2 segment stenoses. 3. 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 Histologic sampling should be considered due to the degree of overlap of imaging features between benign and malignant parotid tumors. 4. Aortic Atherosclerosis (ICD10-I70.0). Electronically Signed   By: KUlyses JarredM.D.   On: 08/27/2019 03:34   MR BRAIN WO CONTRAST  Result Date: 08/27/2019 CLINICAL DATA:  Encephalopathy EXAM: MRI HEAD WITHOUT CONTRAST TECHNIQUE: Multiplanar, multiecho pulse sequences of the brain and surrounding structures were obtained without intravenous contrast. COMPARISON:  Brain MRI 02/03/2013 and head CT 08/26/2019 FINDINGS: Brain: There is a large right PCA territory infarct predominantly affecting the medial temporal lobe and occipital lobe with smaller foci of ischemia in the right thalamus and right middle cerebellar peduncle. There is a large area of encephalomalacia within the left anterior cerebral artery territory. There is diffuse confluent white matter hyperintensity compatible with ischemic microangiopathy. There are  old bilateral cerebellar infarcts. Vascular: There is loss of the normal right PCA flow void. There are 10-20 scattered foci of chronic microhemorrhage in a predominantly peripheral distribution. No acute hemorrhage. Skull and upper cervical spine: The bone marrow signal of the cranium and upper cervical vertebrae is normal. There is no skull base lesion. The visualized upper cervical spinal cord is normal. Sinuses/Orbits: There is no paranasal sinus fluid level or advanced mucosal thickening. There is no mastoid or middle ear effusion. The orbits are normal. Other: None IMPRESSION: 1. Large, acute right PCA territory infarct involving the medial  temporal lobe and occipital lobe with additional smaller foci of acute ischemia within the right thalamus and right middle cerebellar peduncle. 2. Loss of the normal right PCA flow void consistent with occlusion. 3. Multiple foci of chronic microhemorrhage in a predominantly peripheral distribution. 4. No acute hemorrhage or mass effect. Electronically Signed   By: Ulyses Jarred M.D.   On: 08/27/2019 02:13   XR Chest Single View  Result Date: 08/26/2019 CLINICAL DATA:  Altered behavior, confusion and falls today EXAM: PORTABLE CHEST 1 VIEW COMPARISON:  Radiograph Feb 03, 2013 FINDINGS: Nonspecific elevation of the right hemidiaphragm with adjacent opacity likely reflecting atelectasis. No consolidation, convincing features of edema, pneumothorax or effusion. The aorta is calcified. The remaining cardiomediastinal contours are unremarkable. Degenerative changes are present in the imaged spine and shoulders. No acute osseous or soft tissue abnormality. IMPRESSION: 1. Nonspecific elevation of the right hemidiaphragm with adjacent opacity likely reflecting atelectasis. 2.  No other acute cardiopulmonary abnormality. Electronically Signed   By: Lovena Le M.D.   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  Code Status:  FULL CODE per patient,  [facility]: [facility]: Based on patients clinical presentation and evaluation of above clinical data, I have made determination that patient meets Inpatient criteria at this time.  Pt has new acute stroke,  Pt will require further w/up and improved bp control.  Pt has high risk of clinical deterioration.    Time spent in minutes : 55 [id number]   JamJani GravelD on 08/27/2019 at 5:57 AM

## 2019-08-27 NOTE — Evaluation (Signed)
Physical Therapy Evaluation Patient Details Name: [name] MRN: [id number] DOB: 08-15-1956 Today's Date: 08/27/2019   History of Present Illness  [name] is a 63 y.o. year old male with medical history significant for hypertension and previous stroke with some residual deficit who presented on 08/26/2019 with worsening confusion and several falls at home x1 day and was found to have hypertensive emergency with large occipital stroke.  Clinical Impression  Patient presents with decreased independence and safety with mobility due to deficits listed in PT problem list.  Currently mod to max A for all mobility with no real deficit awareness and high risk for falls.  Feel he will benefit from skilled PT in the acute setting and from follow up CIR level rehab if wife able to assist at d/c (reports wife working from home).     Follow Up Recommendations CIR    Equipment Recommendations  Other (comment)(TBA)    Recommendations for Other Services Rehab consult     Precautions / Restrictions Precautions Precautions: Fall Precaution Comments: fell in ED      Mobility  Bed Mobility Overal bed mobility: Needs Assistance Bed Mobility: Supine to Sit;Sit to Supine     Supine to sit: Max assist;HOB elevated Sit to supine: Mod assist   General bed mobility comments: assist for legs and trunk to sit, assist for legs into bed to supine and for positioning in supine  Transfers Overall transfer level: Needs assistance Equipment used: Rolling walker (2 wheeled);1 person hand held assist Transfers: Sit to/from Stand Sit to Stand: Mod assist;Max assist         General transfer comment: lifting assistance to stand to RW, using L UE knocked the walker over so max support for balance in standing and moved walker using R HHA for stability and gait  Ambulation/Gait Ambulation/Gait assistance: Mod assist;Max assist Gait Distance (Feet): 20 Feet Assistive device: 1 person hand held  assist Gait Pattern/deviations: Step-to pattern;Shuffle;Wide base of support;Decreased stride length;Staggering left     General Gait Details: shuffled with wide BOS L leg dragging behind to doorway where he ran straight into door which was half open and at his midline; max A to turn with max cues and pt with LOB to L, cue to look forward at bed and stand straight, knocked over trash can with L UE flailing; returned to bed and sat with uncontrolled descent  Stairs            Wheelchair Mobility    Modified Rankin (Stroke Patients Only) Modified Rankin (Stroke Patients Only) Pre-Morbid Rankin Score: Slight disability(guessing) Modified Rankin: Moderately severe disability     Balance Overall balance assessment: Needs assistance   Sitting balance-Leahy Scale: Poor Sitting balance - Comments: EOB initially with min to mod A then S after adjusted to upright     Standing balance-Leahy Scale: Poor                               Pertinent Vitals/Pain Pain Assessment: No/denies pain Faces Pain Scale: No hurt    Home Living Family/patient expects to be discharged to:: Private residence Living Arrangements: Spouse/significant other Available Help at Discharge: Family Type of Home: House Home Access: Level entry     Home Layout: One level   Additional Comments: wife working from home right now, pt reports working at [employer] (questionable historian)    Prior Function Level of Independence: Independent  Hand Dominance        Extremity/Trunk Assessment   Upper Extremity Assessment Upper Extremity Assessment: LUE deficits/detail LUE Deficits / Details: some wild swinging of L arm noted in response to commands to move LE's LUE Sensation: decreased proprioception LUE Coordination: decreased gross motor    Lower Extremity Assessment Lower Extremity Assessment: LLE deficits/detail;RLE deficits/detail RLE Deficits / Details: AAROM WFL,  moves antigravity, but not following commands for strength testing LLE Deficits / Details: AAROM WFL, moves antigravity, but not as coordinated as R and not following commands for strength testing LLE Coordination: decreased gross motor       Communication   Communication: Receptive difficulties  Cognition Arousal/Alertness: Awake/alert Behavior During Therapy: Impulsive Overall Cognitive Status: Impaired/Different from baseline Area of Impairment: Orientation;Attention;Safety/judgement;Memory;Following commands                 Orientation Level: Disoriented to;Time;Situation Current Attention Level: Sustained   Following Commands: Follows one step commands inconsistently;Follows one step commands with increased time Safety/Judgement: Decreased awareness of deficits;Decreased awareness of safety     General Comments: reports wife sent him here due to him acting out; difficulty following commands needing visual cue or else assist      General Comments General comments (skin integrity, edema, etc.): vision testing noticed fingers on R but not on L in periphery and no blink to threat on L side    Exercises     Assessment/Plan    PT Assessment Patient needs continued PT services  PT Problem List Decreased strength;Decreased activity tolerance;Decreased mobility;Decreased cognition;Decreased knowledge of use of DME;Decreased coordination;Decreased balance;Decreased knowledge of precautions;Decreased safety awareness       PT Treatment Interventions DME instruction;Therapeutic activities;Balance training;Cognitive remediation;Patient/family education;Therapeutic exercise;Functional mobility training;Neuromuscular re-education;Gait training    PT Goals (Current goals can be found in the Care Plan section)  Acute Rehab PT Goals Patient Stated Goal: none stated PT Goal Formulation: Patient unable to participate in goal setting Time For Goal Achievement: 09/10/19 Potential to  Achieve Goals: Good    Frequency Min 4X/week   Barriers to discharge        Co-evaluation               AM-PAC PT "6 Clicks" Mobility  Outcome Measure Help needed turning from your back to your side while in a flat bed without using bedrails?: A Little Help needed moving from lying on your back to sitting on the side of a flat bed without using bedrails?: Total Help needed moving to and from a bed to a chair (including a wheelchair)?: Total Help needed standing up from a chair using your arms (e.g., wheelchair or bedside chair)?: Total Help needed to walk in hospital room?: Total Help needed climbing 3-5 steps with a railing? : Total 6 Click Score: 8    End of Session Equipment Utilized During Treatment: Gait belt Activity Tolerance: Patient limited by fatigue Patient left: in bed;with call bell/phone within reach;with bed alarm set   PT Visit Diagnosis: Other abnormalities of gait and mobility (R26.89);Other symptoms and signs involving the nervous system (R29.898);History of falling (Z91.81);Repeated falls (R29.6)    Time: [id number] PT Time Calculation (min) (ACUTE ONLY): 23 min   Charges:   PT Evaluation $PT Eval High Complexity: 1 High PT Treatments $Gait Training: 8-22 mins        Magda Kiel, [place] [facility] [phone] 08/27/2019   Reginia Naas 08/27/2019, 6:07 PM

## 2019-08-27 NOTE — Progress Notes (Signed)
Pt removed telemetry leads and condom catheter a second time. Soft mittens applied to hands, condom cath and leads reapplied. Will continue to monitor.

## 2019-08-27 NOTE — ED Notes (Signed)
Bed alarm placed and high fall risk bracelet applied to pt

## 2019-08-27 NOTE — Progress Notes (Signed)
  Echocardiogram 2D Echocardiogram has been performed.  [name] 08/27/2019, 10:59 AM

## 2019-08-27 NOTE — Evaluation (Signed)
Clinical/Bedside Swallow Evaluation Patient Details  Name: [name] MRN: [id number] Date of Birth: Jun 15, 1956  Today's Date: 08/27/2019 Time: SLP Start Time (ACUTE ONLY): [id number] SLP Stop Time (ACUTE ONLY): 1506 SLP Time Calculation (min) (ACUTE ONLY): 10 min  Past Medical History:  Past Medical History:  Diagnosis Date  . Anginal pain ([place])   . Heart murmur   . Hypertension   . Psoriasis    "back and stomach" (02/03/2013)  . Stroke ([place]) 02/01/2013   left frontal CVA and symptoms of right arm and leg weakness and expressive aphasia/notes 02/03/2013 (02/03/2013)   Past Surgical History:  Past Surgical History:  Procedure Laterality Date  . NO PAST SURGERIES    . TEE WITHOUT CARDIOVERSION N/A 02/09/2013   Procedure: TRANSESOPHAGEAL ECHOCARDIOGRAM (TEE);  Surgeon: Lelon Perla, MD;  Location: [facility] ENDOSCOPY;  Service: Cardiovascular;  Laterality: N/A;   HPI:  Pt is a 63 yo male admitted with AMS and falls. MRI showed a large PCA territory infarct. PMH includes: CVA (L frontal in 2014, treated for aphasia, mild dysarthria, and cognitive impairment at that time), HTN   Assessment / Plan / Recommendation Clinical Impression  Pt has no overt s/s of aspiration but appears to be at risk primarily because of impulsivity with his rate of intake combined with reduced awareness. He tells me that he has food in his mouth and tries to masticate it when his mouth is in fact empty. At other times he tries to put large amounts of food in his mouth at once and keeps trying to take additional bites until I removed the spoon. I think if he has supervision during meals to assist with pacing and bolus awareness that he could start Dys 2 (chopped) diet and thin liquids. SLP will f/u for tolerance and potential to advance.   SLP Visit Diagnosis: Dysphagia, unspecified (R13.10)    Aspiration Risk  Mild aspiration risk;Moderate aspiration risk    Diet Recommendation Dysphagia 2 (Fine chop);Thin liquid    Liquid Administration via: Cup;Straw Medication Administration: Whole meds with puree Supervision: Staff to assist with self feeding;Full supervision/cueing for compensatory strategies Compensations: Slow rate;Small sips/bites Postural Changes: Seated upright at 90 degrees    Other  Recommendations Oral Care Recommendations: Oral care BID   Follow up Recommendations Inpatient Rehab      Frequency and Duration min 2x/week  2 weeks       Prognosis Prognosis for Safe Diet Advancement: Good Barriers to Reach Goals: Cognitive deficits      Swallow Study   General HPI: Pt is a 63 yo male admitted with AMS and falls. MRI showed a large PCA territory infarct. PMH includes: CVA (L frontal in 2014, treated for aphasia, mild dysarthria, and cognitive impairment at that time), HTN Type of Study: Bedside Swallow Evaluation Previous Swallow Assessment: none in chart Diet Prior to this Study: NPO Temperature Spikes Noted: No Respiratory Status: Room air History of Recent Intubation: No Behavior/Cognition: Alert;Cooperative;Requires cueing Oral Cavity Assessment: Within Functional Limits Oral Care Completed by SLP: No Oral Cavity - Dentition: Missing dentition Vision: Impaired for self-feeding Self-Feeding Abilities: Needs assist Patient Positioning: Upright in bed Baseline Vocal Quality: Normal Volitional Swallow: Able to elicit(with delay)    Oral/Motor/Sensory Function     Ice Chips Ice chips: Not tested   Thin Liquid Thin Liquid: Within functional limits Presentation: Cup;Self Fed;Straw    Nectar Thick Nectar Thick Liquid: Not tested   Honey Thick Honey Thick Liquid: Not tested   Puree Puree: Impaired Presentation:  Self Fed;Spoon Oral Phase Impairments: Poor awareness of bolus   Solid     Solid: Impaired Presentation: Self Fed Oral Phase Impairments: Poor awareness of bolus      [name] Licia Harl 08/27/2019,4:14 PM  Pollyann Glen, M.A. [place] Acute Art therapist [phone] Office [phone]

## 2019-08-28 DIAGNOSIS — Z8673 Personal history of transient ischemic attack (TIA), and cerebral infarction without residual deficits: Secondary | ICD-10-CM

## 2019-08-28 DIAGNOSIS — R4689 Other symptoms and signs involving appearance and behavior: Secondary | ICD-10-CM

## 2019-08-28 LAB — BASIC METABOLIC PANEL
Anion gap: 15 (ref 5–15)
BUN: 6 mg/dL — ABNORMAL LOW (ref 8–23)
CO2: 22 mmol/L (ref 22–32)
Calcium: 9.2 mg/dL (ref 8.9–10.3)
Chloride: 99 mmol/L (ref 98–111)
Creatinine, Ser: 1.33 mg/dL — ABNORMAL HIGH (ref 0.61–1.24)
GFR calc Af Amer: >60 mL/min (ref >60)
GFR calc non Af Amer: 57 mL/min — ABNORMAL LOW (ref >60)
Glucose, Bld: 106 mg/dL — ABNORMAL HIGH (ref 70–99)
Potassium: 3.2 mmol/L — ABNORMAL LOW (ref 3.5–5.1)
Sodium: 136 mmol/L (ref 135–145)

## 2019-08-28 MED ORDER — POTASSIUM CHLORIDE CRYS ER 20 MEQ PO TBCR
20.0000 meq | EXTENDED_RELEASE_TABLET | Freq: Two times a day (BID) | ORAL | Status: AC
  Administered 2019-08-28 – 2019-08-29 (×3): 20 meq via ORAL
  Filled 2019-08-28 (×4): qty 1

## 2019-08-28 NOTE — Progress Notes (Signed)
Physical Therapy Treatment Patient Details Name: [name] MRN: [id number] DOB: [birth year] Today's Date: 08/28/2019    History of Present Illness [name] is a 63 y.o. year old male with medical history significant for hypertension and previous stroke with some residual deficit who presented on 08/26/2019 with worsening confusion and several falls at home x1 day and was found to have hypertensive emergency with large occipital stroke.    PT Comments    Pt was seen for mobility but was not able to get awake enough for sitting up and walking.  Pt declined to do more than move in the bed and to get LE exercise done.  He is not able to keep eyes open consistently, so nursing determined that bed alarm level would be increased and will leave restraints off for now.  He is not willing to get OOB but last visit was walking, so will move forward with plan as previously set.   Follow Up Recommendations  CIR     Equipment Recommendations  Other (comment)(CIR to determine)    Recommendations for Other Services Rehab consult     Precautions / Restrictions Precautions Precautions: Fall Precaution Comments: fell in ED(L side visual deficit per prev PT note) Restrictions Weight Bearing Restrictions: No    Mobility  Bed Mobility Overal bed mobility: Needs Assistance Bed Mobility: Rolling Rolling: Max assist;+2 for physical assistance;+2 for safety/equipment         General bed mobility comments: requires help to scoot up bed max 2, pt is exerting very limited effort and declined to sit up on side of bed.  Much more sleepy today  Transfers                 General transfer comment: declined  Ambulation/Gait                 Stairs             Wheelchair Mobility    Modified Rankin (Stroke Patients Only) Modified Rankin (Stroke Patients Only) Pre-Morbid Rankin Score: Slight disability Modified Rankin: Moderately severe disability     Balance Overall  balance assessment: Needs assistance                                          Cognition Arousal/Alertness: Lethargic Behavior During Therapy: Flat affect Overall Cognitive Status: Impaired/Different from baseline Area of Impairment: Problem solving;Awareness;Safety/judgement;Following commands;Attention;Orientation                 Orientation Level: Time;Situation Current Attention Level: Selective   Following Commands: Follows one step commands inconsistently;Follows one step commands with increased time Safety/Judgement: Decreased awareness of safety;Decreased awareness of deficits Awareness: Intellectual Problem Solving: Decreased initiation;Requires verbal cues;Requires tactile cues        Exercises General Exercises - Lower Extremity Ankle Circles/Pumps: AAROM Heel Slides: AAROM;10 reps Hip ABduction/ADduction: AAROM;10 reps    General Comments        Pertinent Vitals/Pain Pain Assessment: Faces Faces Pain Scale: No hurt    Home Living                      Prior Function            PT Goals (current goals can now be found in the care plan section) Acute Rehab PT Goals Patient Stated Goal: none, lethargic    Frequency    Min 4X/week  PT Plan Current plan remains appropriate    Co-evaluation              AM-PAC PT "6 Clicks" Mobility   Outcome Measure  Help needed turning from your back to your side while in a flat bed without using bedrails?: A Lot Help needed moving from lying on your back to sitting on the side of a flat bed without using bedrails?: Total Help needed moving to and from a bed to a chair (including a wheelchair)?: Total Help needed standing up from a chair using your arms (e.g., wheelchair or bedside chair)?: Total Help needed to walk in hospital room?: Total Help needed climbing 3-5 steps with a railing? : Total 6 Click Score: 7    End of Session   Activity Tolerance: Patient limited  by fatigue;Patient limited by lethargy Patient left: in bed;with call bell/phone within reach;with bed alarm set;Other (comment)(nursing will leave restraints off for now) Nurse Communication: Mobility status;Other (comment)(lethargic effort by comparison to last visit) PT Visit Diagnosis: Other abnormalities of gait and mobility (R26.89);Other symptoms and signs involving the nervous system (R29.898);History of falling (Z91.81);Repeated falls (R29.6)     Time: [id number] PT Time Calculation (min) (ACUTE ONLY): 18 min  Charges:  $Therapeutic Exercise: 8-22 mins                   Ramond Dial 08/28/2019, 12:42 PM   Mee Hives, PT MS Acute Rehab Dept. Number: [place] [facility] [place]

## 2019-08-28 NOTE — Progress Notes (Signed)
[facility]  PROGRESS NOTE  [name] ONG:[id number] DOB: 1955-10-10 DOA: 08/26/2019 PCP: Alroy Dust, L.[name], MD Admit date - 08/26/2019   Admitting Physician Jani Gravel, MD  Outpatient Primary MD for the patient is [place], L.[name], MD  LOS - 1 Brief Narrative   [name] is a 63 y.o. year old male with medical history significant for hypertension and previous stroke with some residual deficit who presented on 08/26/2019 with worsening confusion and several falls at home x1 day and was found to have hypertensive emergency with large occipital stroke. In ED initially noted to be hypertensive with blood pressure 230/125 and creatinine of 1.3.  Upon evaluation in the ED patient was intermittently following commands with noticeable facial droop and slurred speech per family consistent with his baseline of previous stroke.  CT head initially revealed age-indeterminate infarct.  Given persistently high blood pressure and intermittent confusion neurology was consulted who recommended permissive hypertension and ordered MRI showed a large occipital lobe stroke.   Hospital course complicated by continued confusion. Patient continues to get up from bed and urinate. He fell in the ED and stuck head on door. CT head done and showed no acute change from prior stroke  Subjective  [name] today has no acute complaints. , No headache, No chest pain, No abdominal pain - No Nausea,  A & P   Large PCA territory infarct on the right.  History of previous stroke.  Found to be cocaine positive.  Likely in the setting of hypertensive emergency and substance abuse, likely thrombotic could also be embolic.  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  Preserved EF on TTE without PFO.  A1c 6.2, LDL 107, UDS positive for cocaine -Neurology consulted on admission  -Frequent neuro checks -Aspirin 325 mg p.o. or 300 mg PR,  plavix 75 mg qd for DAPT x 3 months per neurology -Monitor on telemetry,   --PT recs CIR, inpt rehab consulted  Confusion, stable.  Likely multifactorial large PCA stroke in addition to residual effects of Haldol/Ativan received in the ED. fell again in ED hitting head on door after getting up from bed. Currently oriented to self, place. -Repeat CT head shows no evolution or worsening of known disease -Avoid further sedatives -Unfortunately currently needs restraints, given high fall risk and inability to follow commands safely  Hypertension.  Allow permissive hypertension, okay if less than 220/120 over the next 5 to 7 days -Lisinopril, HCTZ, labetalol as needed  Substance abuse. 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  Incidentally found on imaging for stroke work-up -Histologic sampling should be considered due to degree of overlap of imaging features between benign and malignant parotid tumors by ENT  Elevated alk phos.  No elevation in LFTs or bilirubin.  GGT unremarkable.  No abdominal exam pain -Closely monitor  Renal insufficiency -Unclear if acute -Last creatinine from 2041.07 -1.32 on admission -Avoid nephrotoxins, monitor creatinine on BMP, has normal output   Family Communication  : Called wife at listed number, left voice message, will call again on 12/12  Code Status : Full code  Disposition Plan  : PT recs CIR, close monitoring of neuro status, continue aspirin and Plavix  Consults  : Neurology  Procedures  : TTE  DVT Prophylaxis  : Aspirin, Plavix, SCDs  Lab Results  Component Value Date  PLT 281 08/27/2019    Diet :  Diet Order            DIET DYS 2 Room service appropriate? Yes; Fluid consistency: Thin  Diet effective now               Inpatient Medications Scheduled Meds: .  stroke: mapping our early stages of recovery  book   Does not apply Once  . aspirin  300 mg Rectal Daily   Or  . aspirin  325 mg Oral Daily  . atorvastatin  80 mg Oral q1800  . clopidogrel  75 mg Oral Daily  . hydrochlorothiazide  25 mg Oral Daily  . lisinopril  10 mg Oral Daily   Continuous Infusions: PRN Meds:.acetaminophen **OR** acetaminophen (TYLENOL) oral liquid 160 mg/5 mL **OR** acetaminophen, labetalol  Antibiotics  :   Anti-infectives (From admission, onward)   None       Objective   Vitals:   08/27/19 1631 08/27/19 1848 08/27/19 2335 08/28/19 0746  BP: (!) 192/106  (!) 108/94 120/90  Pulse: 97  80 90  Resp:  _0 Temp: 98.6 F (37 C)  98.4 F (36.9 C) 98.6 F (37 C)  TempSrc: Oral  Oral Oral  SpO2: 100%       SpO2: 100 %  Wt Readings from Last 3 Encounters:  06/10/13 85.3 kg  05/14/13 80.8 kg  03/12/13 83.9 kg     Intake/Output Summary (Last 24 hours) at 08/28/2019 1137 Last data filed at 08/27/2019 2100 Gross per 24 hour  Intake --  Output 675 ml  Net -675 ml    Physical Exam:  Awake Alert, oriented to place, self Restraints in place Decreased attention Left face droop Moves all extremities against gravity on command [place].AT, No JVD Symmetrical Chest wall movement, Good air movement bilaterally on room air, CTAB RRR,No Gallops,Rubs or new Murmurs,  +ve B.Sounds, Abd Soft, No tenderness,  No Cyanosis, Clubbing or edema, No new Rash or bruise     I have personally reviewed the following:   Data Reviewed:  CBC Recent Labs  Lab 08/26/19 2047 08/27/19 0846  WBC 8.5 8.3  HGB 17.0 15.9  HCT 52.3* 49.5  PLT 265 281  MCV 84.5 83.8  MCH 27.5 26.9  MCHC 32.5 32.1  RDW 14.5 14.3    Chemistries  Recent Labs  Lab 08/26/19 2047 08/27/19 0846  NA 140 141  K 3.5 3.5  CL 106 105  CO2 23 25  GLUCOSE 100* 100*  BUN 9 7*  CREATININE 1.32* 1.30*  CALCIUM 9.2 9.1  AST 16 20  ALT 25 24  ALKPHOS 140* 140*  BILITOT 1.1 1.9*    ------------------------------------------------------------------------------------------------------------------ Recent Labs    08/27/19 0846  CHOL 167  HDL 40*  LDLCALC 107*  TRIG 98  CHOLHDL 4.2    Lab Results  Component Value Date   HGBA1C 6.2 (H) 08/27/2019   ------------------------------------------------------------------------------------------------------------------ No results for input(s): TSH, T4TOTAL, T3FREE, THYROIDAB in the last 72 hours.  Invalid input(s): FREET3 ------------------------------------------------------------------------------------------------------------------ No results for input(s): VITAMINB12, FOLATE, FERRITIN, TIBC, IRON, RETICCTPCT in the last 72 hours.  Coagulation profile No results for input(s): INR, PROTIME in the last 168 hours.  No results for input(s): DDIMER in the last 72 hours.  Cardiac Enzymes No results for input(s): CKMB, TROPONINI, MYOGLOBIN in the last 168 hours.  Invalid input(s): CK ------------------------------------------------------------------------------------------------------------------ No results found for: BNP  Micro Results Recent Results (from the past 240 hour(s))  SARS CORONAVIRUS 2 (  TAT 6-24 HRS) Nasopharyngeal Nasopharyngeal Swab     Status: None   Collection Time: 08/26/19  9:54 PM   Specimen: Nasopharyngeal Swab  Result Value Ref Range Status   SARS Coronavirus 2 NEGATIVE NEGATIVE Final    Comment: (NOTE) SARS-CoV-2 target nucleic acids are NOT DETECTED. The SARS-CoV-2 RNA is generally detectable in upper and lower respiratory specimens during the acute phase of infection. Negative results do not preclude SARS-CoV-2 infection, do not rule out co-infections with other pathogens, and should not be used as the sole basis for treatment or other patient management decisions. Negative results must be combined with clinical observations, patient history, and epidemiological information. The  expected result is Negative. Fact Sheet for Patients: SugarRoll.be Fact Sheet for Healthcare Providers: https://www.woods-mathews.com/ This test is not yet approved or cleared by the [place] FDA and  has been authorized for detection and/or diagnosis of SARS-CoV-2 by FDA under an Emergency Use Authorization (EUA). This EUA will remain  in effect (meaning this test can be used) for the duration of the COVID-19 declaration under Section 56 4(b)(1) of the Act, 21 U.S.C. section 360bbb-3(b)(1), unless the authorization is terminated or revoked sooner. Performed at [facility], [place] [address] [place], [place] [address]     Radiology Reports CT ANGIO HEAD W OR WO CONTRAST  Result Date: 08/27/2019 CLINICAL DATA:  Stroke follow-up. Known right PCA stroke. EXAM: CT ANGIOGRAPHY HEAD AND NECK TECHNIQUE: Multidetector CT imaging of the head and neck was performed using the standard protocol during bolus administration of intravenous contrast. Multiplanar CT image reconstructions and MIPs were obtained to evaluate the vascular anatomy. Carotid stenosis measurements (when applicable) are obtained utilizing NASCET criteria, using the distal internal carotid diameter as the denominator. CONTRAST:  44m OMNIPAQUE IOHEXOL 350 MG/ML SOLN COMPARISON:  None. FINDINGS: CTA NECK FINDINGS SKELETON: There is no bony spinal canal stenosis. No lytic or blastic lesion. 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 UPPER CHEST: No pneumothorax or pleural effusion. No nodules or masses. AORTIC ARCH: There is mild calcific atherosclerosis of the aortic arch. There is no aneurysm, dissection or hemodynamically significant stenosis of the visualized portion of the aorta. Conventional 3 vessel aortic branching pattern. The visualized proximal subclavian arteries are widely patent. RIGHT CAROTID SYSTEM: Normal without  aneurysm, dissection or stenosis. LEFT CAROTID SYSTEM: Normal without aneurysm, dissection or stenosis. VERTEBRAL ARTERIES: Right dominant configuration. Both origins are clearly patent. There is no dissection, occlusion or flow-limiting stenosis to the skull base (V1-V3 segments). CTA HEAD FINDINGS POSTERIOR CIRCULATION: --Vertebral arteries: Normal V4 segments. --Posterior inferior cerebellar arteries (PICA): Patent origins from the vertebral arteries. --Anterior inferior cerebellar arteries (AICA): Patent origins from the basilar artery. --Basilar artery: Normal. --Superior cerebellar arteries: Normal. --Posterior cerebral arteries: The right PCA is occluded at the P1 P2 junction. ANTERIOR CIRCULATION: --Intracranial internal carotid arteries: Atherosclerotic calcification of the internal carotid arteries at the skull base without hemodynamically significant stenosis. --Anterior cerebral arteries (ACA): Normal. Absent right A1 segment, normal variant --Middle cerebral arteries (MCA): The M1 segments are normal. There is multifocal severe stenosis of the M2 segments bilaterally. VENOUS SINUSES: As permitted by contrast timing, patent. ANATOMIC VARIANTS: None Review of the MIP images confirms the above findings. IMPRESSION: 1. 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 2. Bilateral multifocal severe M2 segment stenoses. 3. 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 Histologic sampling should be considered due to the degree of overlap of imaging features between benign and malignant parotid tumors. 4. Aortic Atherosclerosis (ICD10-I70.0). Electronically Signed   By: Ulyses Jarred M.D.   On: 08/27/2019 03:34   CT Head Wo Contrast  Result Date: 08/27/2019 CLINICAL DATA:  History of cocaine abuse.  Status post fall. EXAM: CT HEAD WITHOUT CONTRAST TECHNIQUE: Contiguous axial images were obtained from the  base of the skull through the vertex without intravenous contrast. COMPARISON:  Brain MRI 08/27/2019.  Head CT scan 08/26/2019. FINDINGS: Brain: No evidence of hemorrhage, hydrocephalus, extra-axial collection or mass lesion/mass effect. Acute right PCA territory infarct without hemorrhagic transformation is again seen. Atrophy, chronic microvascular ischemic change, remote left ACA infarct and remote bilateral cerebellar infarcts, larger on the right also again seen. Vascular: Atherosclerosis. Skull: Intact.  No focal lesion. Sinuses/Orbits: Negative. Other: None. IMPRESSION: Acute right PCA territory infarct is identified as seen on the comparison examinations. Negative for hemorrhagic transformation. Atrophy, chronic microvascular ischemic change and remote infarcts as described above. Electronically Signed   By: Inge Rise M.D.   On: 08/27/2019 12:54   CT Head  Result Date: 08/26/2019 CLINICAL DATA:  Status post fall, altered mental status. EXAM: CT HEAD WITHOUT CONTRAST TECHNIQUE: Contiguous axial images were obtained from the base of the skull through the vertex without intravenous contrast. COMPARISON:  Feb 03, 2013 FINDINGS: Brain: There is no midline shift, hydrocephalus, or mass. No acute hemorrhage is identified. There is lucency identified in the right cerebellum, likely due to an infarct, age indeterminate. Old infarct is identified in the left cerebellum. Encephalomalacia of the left frontal lobe is identified consistent with old infarct. Small old lacunar infarctions are identified in bilateral basal ganglia. Vascular: No hyperdense vessel is noted. Skull: Normal. Negative for fracture or focal lesion. Sinuses/Orbits: No acute finding. Other: None. IMPRESSION: 1. There is lucency identified in the right cerebellum, likely due to an infarct, age indeterminate. 2. Old infarcts identified in the left frontal lobe, left cerebellum, and bilateral basal ganglia. Electronically Signed   By:  Abelardo Diesel M.D.   On: 08/26/2019 20:05   CT ANGIO NECK W OR WO CONTRAST  Result Date: 08/27/2019 CLINICAL DATA:  Stroke follow-up. Known right PCA stroke. EXAM: CT ANGIOGRAPHY HEAD AND NECK TECHNIQUE: Multidetector CT imaging of the head and neck was performed using the standard protocol during bolus administration of intravenous contrast. Multiplanar CT image reconstructions and MIPs were obtained to evaluate the vascular anatomy. Carotid stenosis measurements (when applicable) are obtained utilizing NASCET criteria, using the distal internal carotid diameter as the denominator. CONTRAST:  2m OMNIPAQUE IOHEXOL 350 MG/ML SOLN COMPARISON:  None. FINDINGS: CTA NECK FINDINGS SKELETON: There is no bony spinal canal stenosis. No lytic or blastic lesion. 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 UPPER CHEST: No pneumothorax or pleural effusion. No nodules or masses. AORTIC ARCH: There is mild calcific atherosclerosis of the aortic arch. There is no aneurysm, dissection or hemodynamically significant stenosis of the visualized portion of the aorta. Conventional 3 vessel aortic branching pattern. The visualized proximal subclavian arteries are widely patent. RIGHT CAROTID SYSTEM: Normal without aneurysm, dissection or stenosis. LEFT CAROTID SYSTEM: Normal without aneurysm, dissection or stenosis. VERTEBRAL ARTERIES: Right dominant configuration. Both origins are clearly patent. There is no dissection, occlusion or flow-limiting stenosis to the skull base (V1-V3 segments). CTA HEAD FINDINGS POSTERIOR CIRCULATION: --Vertebral arteries: Normal  V4 segments. --Posterior inferior cerebellar arteries (PICA): Patent origins from the vertebral arteries. --Anterior inferior cerebellar arteries (AICA): Patent origins from the basilar artery. --Basilar artery: Normal. --Superior cerebellar arteries: Normal. --Posterior cerebral arteries: The right PCA is occluded at  the P1 P2 junction. ANTERIOR CIRCULATION: --Intracranial internal carotid arteries: Atherosclerotic calcification of the internal carotid arteries at the skull base without hemodynamically significant stenosis. --Anterior cerebral arteries (ACA): Normal. Absent right A1 segment, normal variant --Middle cerebral arteries (MCA): The M1 segments are normal. There is multifocal severe stenosis of the M2 segments bilaterally. VENOUS SINUSES: As permitted by contrast timing, patent. ANATOMIC VARIANTS: None Review of the MIP images confirms the above findings. IMPRESSION: 1. 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 2. Bilateral multifocal severe M2 segment stenoses. 3. 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 Histologic sampling should be considered due to the degree of overlap of imaging features between benign and malignant parotid tumors. 4. Aortic Atherosclerosis (ICD10-I70.0). Electronically Signed   By: Ulyses Jarred M.D.   On: 08/27/2019 03:34   MR BRAIN WO CONTRAST  Result Date: 08/27/2019 CLINICAL DATA:  Encephalopathy EXAM: MRI HEAD WITHOUT CONTRAST TECHNIQUE: Multiplanar, multiecho pulse sequences of the brain and surrounding structures were obtained without intravenous contrast. COMPARISON:  Brain MRI 02/03/2013 and head CT 08/26/2019 FINDINGS: Brain: There is a large right PCA territory infarct predominantly affecting the medial temporal lobe and occipital lobe with smaller foci of ischemia in the right thalamus and right middle cerebellar peduncle. There is a large area of encephalomalacia within the left anterior cerebral artery territory. There is diffuse confluent white matter hyperintensity compatible with ischemic microangiopathy. There are old bilateral cerebellar infarcts. Vascular: There is loss of the normal right PCA flow void. There are 10-20 scattered foci of chronic  microhemorrhage in a predominantly peripheral distribution. No acute hemorrhage. Skull and upper cervical spine: The bone marrow signal of the cranium and upper cervical vertebrae is normal. There is no skull base lesion. The visualized upper cervical spinal cord is normal. Sinuses/Orbits: There is no paranasal sinus fluid level or advanced mucosal thickening. There is no mastoid or middle ear effusion. The orbits are normal. Other: None IMPRESSION: 1. Large, acute right PCA territory infarct involving the medial temporal lobe and occipital lobe with additional smaller foci of acute ischemia within the right thalamus and right middle cerebellar peduncle. 2. Loss of the normal right PCA flow void consistent with occlusion. 3. Multiple foci of chronic microhemorrhage in a predominantly peripheral distribution. 4. No acute hemorrhage or mass effect. Electronically Signed   By: Ulyses Jarred M.D.   On: 08/27/2019 02:13   XR Chest Single View  Result Date: 08/26/2019 CLINICAL DATA:  Altered behavior, confusion and falls today EXAM: PORTABLE CHEST 1 VIEW COMPARISON:  Radiograph Feb 03, 2013 FINDINGS: Nonspecific elevation of the right hemidiaphragm with adjacent opacity likely reflecting atelectasis. No consolidation, convincing features of edema, pneumothorax or effusion. The aorta is calcified. The remaining cardiomediastinal contours are unremarkable. Degenerative changes are present in the imaged spine and shoulders. No acute osseous or soft tissue abnormality. IMPRESSION: 1. Nonspecific elevation of the right hemidiaphragm with adjacent opacity likely reflecting atelectasis. 2.  No other acute cardiopulmonary abnormality. Electronically Signed   By: Lovena Le M.D.   On: 08/26/2019 20:30   ECHOCARDIOGRAM COMPLETE  Result Date: 08/27/2019   ECHOCARDIOGRAM REPORT   Patient Name:   [name]  [name] Date of Exam: 08/27/2019 Medical Rec #:  [id number]     Height:       69.0 in Accession #:    [id number]    Weight:        188.0 lb Date of Birth:  Sep 11, 1956     BSA:          2.01 m Patient Age:    69 years      BP:           132/101 mmHg Patient Gender: M             HR:           61 bpm. Exam Location:  Inpatient Procedure: 2D Echo Indications:    Stroke 434.91/I163.9  History:        Patient has prior history of Echocardiogram examinations, most                 recent 02/09/2013. Stroke; Risk Factors:Hypertension.  Sonographer:    Clayton Lefort RDCS (AE) Referring Phys: [id number] Jani Gravel  Sonographer Comments: Patient unable to hold breath when requested. IMPRESSIONS  1. Left ventricular ejection fraction, by visual estimation, is 65 to 70%. The left ventricle has normal function. There is severely increased left ventricular hypertrophy.  2. Left ventricular diastolic parameters are consistent with Grade I diastolic dysfunction (impaired relaxation).  3. The left ventricle has no regional wall motion abnormalities.  4. Global right ventricle has normal systolic function.The right ventricular size is normal. No increase in right ventricular wall thickness.  5. Left atrial size was normal.  6. Right atrial size was normal.  7. The mitral valve is normal in structure. Trivial mitral valve regurgitation.  8. The tricuspid valve is normal in structure. Tricuspid valve regurgitation is not demonstrated.  9. The aortic valve is normal in structure. Aortic valve regurgitation is not visualized. Mild aortic valve sclerosis without stenosis. 10. The pulmonic valve was grossly normal. Pulmonic valve regurgitation is not visualized. 11. The atrial septum is grossly normal. FINDINGS  Left Ventricle: Left ventricular ejection fraction, by visual estimation, is 65 to 70%. The left ventricle has normal function. The left ventricle has no regional wall motion abnormalities. There is severely increased left ventricular hypertrophy. Asymmetric left ventricular hypertrophy. Left ventricular diastolic parameters are consistent with Grade I diastolic  dysfunction (impaired relaxation). Right Ventricle: The right ventricular size is normal. No increase in right ventricular wall thickness. Global RV systolic function is has normal systolic function. Left Atrium: Left atrial size was normal in size. Right Atrium: Right atrial size was normal in size Pericardium: There is no evidence of pericardial effusion. Mitral Valve: The mitral valve is normal in structure. Trivial mitral valve regurgitation. Tricuspid Valve: The tricuspid valve is normal in structure. Tricuspid valve regurgitation is not demonstrated. Aortic Valve: The aortic valve is normal in structure. Aortic valve regurgitation is not visualized. Mild aortic valve sclerosis is present, with no evidence of aortic valve stenosis. Aortic valve mean gradient measures 4.0 mmHg. Aortic valve peak gradient measures 7.2 mmHg. Aortic valve area, by VTI measures 2.46 cm. Pulmonic Valve: The pulmonic valve was grossly normal. Pulmonic valve regurgitation is not visualized. Pulmonic regurgitation is not visualized. Aorta: The aortic root and ascending aorta are structurally normal, with no evidence of dilitation. IAS/Shunts: The atrial septum is grossly normal.  LEFT VENTRICLE PLAX 2D LVIDd:         3.90 cm  Diastology LVIDs:         2.20  cm  LV e' lateral:   6.53 cm/s LV PW:         1.40 cm  LV E/e' lateral: 9.3 LV IVS:        1.90 cm  LV e' medial:    5.87 cm/s LVOT diam:     2.00 cm  LV E/e' medial:  10.4 LV SV:         50 ml LV SV Index:   24.19 LVOT Area:     3.14 cm  RIGHT VENTRICLE RV S prime:     11.00 cm/s TAPSE (M-mode): 1.7 cm LEFT ATRIUM           Index       RIGHT ATRIUM           Index LA diam:      2.70 cm 1.34 cm/m  RA Area:     15.00 cm LA Vol (A2C): 65.5 ml 32.55 ml/m RA Volume:   36.00 ml  17.89 ml/m LA Vol (A4C): 32.6 ml 16.20 ml/m  AORTIC VALVE AV Area (Vmax):    2.67 cm AV Area (Vmean):   2.63 cm AV Area (VTI):     2.46 cm AV Vmax:           134.00 cm/s AV Vmean:          91.000 cm/s AV  VTI:            0.290 m AV Peak Grad:      7.2 mmHg AV Mean Grad:      4.0 mmHg LVOT Vmax:         114.00 cm/s LVOT Vmean:        76.100 cm/s LVOT VTI:          0.227 m LVOT/AV VTI ratio: 0.78  AORTA Ao Root diam: 3.40 cm Ao Asc diam:  2.90 cm MITRAL VALVE MV Area (PHT): 2.66 cm             SHUNTS MV PHT:        82.65 msec           Systemic VTI:  0.23 m MV Decel Time: 285 msec             Systemic Diam: 2.00 cm MV E velocity: 61.00 cm/s 103 cm/s MV A velocity: 84.40 cm/s 70.3 cm/s MV E/A ratio:  0.72       1.5  Mertie Moores MD Electronically signed by Mertie Moores MD Signature Date/Time: 08/27/2019/12:29:31 PM    Final      Time Spent in minutes  30     Desiree Hane M.D on 08/28/2019 at 11:37 AM  To page go to [email or web address] - password [facility]

## 2019-08-28 NOTE — Progress Notes (Signed)
STROKE TEAM PROGRESS NOTE   INTERVAL HISTORY Wife at the bedside.  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  Wife denies patient taking drugs, however his cocaine positive on urine test.  CT head and neck also showed right PCA occlusion.  He is stroke likely due to large vessel disease in the setting of cocaine use.  OBJECTIVE Vitals:   08/27/19 1631 08/27/19 1848 08/27/19 2335 08/28/19 0746  BP: (!) 192/106  (!) 108/94 120/90  Pulse: 97  80 90  Resp:  10 16 12   Temp: 98.6 F (37 C)  98.4 F (36.9 C) 98.6 F (37 C)  TempSrc: Oral  Oral Oral  SpO2: 100%       CBC:  Recent Labs  Lab 08/26/19 2047 08/27/19 0846  WBC 8.5 8.3  HGB 17.0 15.9  HCT 52.3* 49.5  MCV 84.5 83.8  PLT 265 [id number]    Basic Metabolic Panel:  Recent Labs  Lab 08/27/19 0846 08/28/19 0237  NA 141 136  K 3.5 3.2*  CL 105 99  CO2 25 22  GLUCOSE 100* 106*  BUN 7* 6*  CREATININE 1.30* 1.33*  CALCIUM 9.1 9.2    Lipid Panel:     Component Value Date/Time   CHOL 167 08/27/2019 0846   TRIG 98 08/27/2019 0846   HDL 40 (L) 08/27/2019 0846   CHOLHDL 4.2 08/27/2019 0846   VLDL 20 08/27/2019 0846   LDLCALC 107 (H) 08/27/2019 0846   HgbA1c:  Lab Results  Component Value Date   HGBA1C 6.2 (H) 08/27/2019   Urine Drug Screen:     Component Value Date/Time   LABOPIA NONE DETECTED 08/27/2019 0029   COCAINSCRNUR POSITIVE (A) 08/27/2019 0029   LABBENZ NONE DETECTED 08/27/2019 0029   AMPHETMU NONE DETECTED 08/27/2019 0029   THCU NONE DETECTED 08/27/2019 0029   LABBARB NONE DETECTED 08/27/2019 0029    Alcohol Level     Component Value Date/Time   ETH <10 08/26/2019 2047    IMAGING  CT ANGIO HEAD W OR WO CONTRAST CT ANGIO NECK W OR WO CONTRAST 08/27/2019 IMPRESSION:  1. 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  2. Bilateral multifocal severe M2 segment stenoses.  3. 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 Histologic sampling should be considered due to the degree of overlap of imaging features between benign and malignant parotid tumors.  4. Aortic Atherosclerosis (ICD10-I70.0).   CT Head 08/26/2019 IMPRESSION:  1. There is lucency identified in the right cerebellum, likely due to an infarct, age indeterminate.  2. Old infarcts identified in the left frontal lobe, left cerebellum, and bilateral basal ganglia.   MR BRAIN WO CONTRAST 08/27/2019 IMPRESSION:  1. Large, acute right PCA territory infarct involving the medial temporal lobe and occipital lobe with additional smaller foci of acute ischemia within the right thalamus and right middle cerebellar peduncle.  2. Loss of the normal right PCA flow void consistent with occlusion.  3. Multiple foci of chronic microhemorrhage in a predominantly peripheral distribution.  4. No acute hemorrhage or mass effect.   XR Chest Single View 08/26/2019 IMPRESSION:  1. Nonspecific elevation of the right hemidiaphragm with adjacent opacity likely reflecting atelectasis.  2.  No other acute cardiopulmonary abnormality.   Transthoracic Echocardiogram  08/27/2019 IMPRESSIONS  1. Left ventricular ejection fraction, by visual estimation, is  65 to 70%. The left ventricle has normal function. There is severely increased left ventricular hypertrophy.  2. Left ventricular diastolic parameters are consistent with Grade I diastolic dysfunction (impaired relaxation).  3. The left ventricle has no regional wall motion abnormalities.  4. Global right ventricle has normal systolic function.The right ventricular size is normal. No increase in right ventricular wall thickness.  5. Left atrial size was normal.  6. Right atrial size was normal.  7. The mitral valve is normal in structure. Trivial mitral valve regurgitation.  8. The tricuspid valve is normal in  structure. Tricuspid valve regurgitation is not demonstrated.  9. The aortic valve is normal in structure. Aortic valve regurgitation is not visualized. Mild aortic valve sclerosis without stenosis. 10. The pulmonic valve was grossly normal. Pulmonic valve regurgitation is not visualized. 11. The atrial septum is grossly normal.   ECG - SR rate 91 BPM. (See cardiology reading for complete details)   PHYSICAL EXAM  Temp:  [98.4 F (36.9 C)-98.6 F (37 C)] 98.6 F (37 C) (12/12 0746) Pulse Rate:  [80-97] 90 (12/12 0746) Resp:  [10-16] 15 (12/12 1400) BP: (108-192)/(90-106) 120/90 (12/12 0746) SpO2:  [100 %] 100 % (12/11 1631)  General - Well nourished, well developed, in no apparent distress.  Ophthalmologic - fundi not visualized due to noncooperation.  Cardiovascular - Regular rhythm and rate.  Mental Status -  Level of arousal and orientation to time, place, and person were intact. Language including expression, naming, repetition, comprehension was assessed and found intact, however moderate dysarthria.  Left neglect.  Cranial Nerves II - XII - II - left hemianopia. III, IV, VI - Extraocular movements intact. V - Facial sensation intact bilaterally. VII - Facial movement intact bilaterally. VIII - Hearing & vestibular intact bilaterally. X - Palate elevates symmetrically, moderate dysarthria. XI - Chin turning & shoulder shrug intact bilaterally. XII - Tongue protrusion intact.  Motor Strength - The patient's strength was normal in right upper and lower extremities.  By observation, he was able to move left upper and lower extremity immediately antigravity and seem to have equal strength symmetrical to the right, however, he was not able to recognize his own arm and leg on the left, consistent with asomatognosia.  Bulk was normal and fasciculations were absent.   Motor Tone - Muscle tone was assessed at the neck and appendages and was normal.  Reflexes - The patient's  reflexes were symmetrical in all extremities and he had no pathological reflexes.  Sensory - Light touch, temperature/pinprick were assessed and were symmetrical.    Coordination - The patient had normal movements in the right hand with no ataxia or dysmetria.  Tremor was absent.  Gait and Station - deferred.   ASSESSMENT/PLAN [name] is a 63 y.o. male with history of substance abuse, hypertension and stroke (2014) who presents with altered mental status and agitation.   Stroke: right PCA territory infarcts likely due to large vessel disease in the setting of cocaine use although cardioembolic source can not be ruled out  Resultant left hemianopia and left asomatognosia  CT head - There is lucency identified in the right cerebellum, likely due to an infarct, age indeterminate. Old infarcts identified in the left frontal lobe, left cerebellum, and bilateral basal ganglia.   MRI head - Large, acute right PCA territory infarct involving the medial temporal lobe and occipital lobe with additional smaller foci of acute ischemia within the right thalamus and right middle cerebellar peduncle.   CTA  H&N - Occlusion of the right PCA at the P1 P2 junction. Bilateral multifocal severe M2 segment stenoses.   2D Echo - EF 65 to 70%. No cardiac source of emboli identified.   Recommend outpatient 30 day cardiac event monitoring to rule out A. fib  Lacey Jensen Virus 2 - negative  LDL - 107  HgbA1c - 6.2  UDS - positive for cocaine  VTE prophylaxis - SCDs  aspirin 325 mg daily prior to admission, now on aspirin 325 mg daily and Plavix 75 DAPT.  Continue DAPT for 3 months and then Plavix alone.  Patient counseled to be compliant with his antithrombotic medications  Ongoing aggressive stroke risk factor management  Therapy recommendations:  CIR  Disposition:  Pending  History of a stroke  01/2013 admitted for right-sided weakness, aphasia.  MRI showed left ACA infarct.  MRI  showed left ACA occlusion.  EF 65%.  Carotid Doppler negative.  EEG no seizure.  LDL 127, A1c 5.7 hypercoagulable work-up negative.  UDS showed positive for THC.  TEE done and result not available.  Patient discharged with aspirin 325 and Lipitor 10.  Cocaine abuse  UDS positive for cocaine  Wife stated he was not on illicit drugs  Educated on no illicit drugs  Hypertension  Home BP meds: Lisinopril  Current BP meds: Lisinopril and HCTZ (Labetalol prn)  Blood pressure high on admission . Gradually normalize in 3-5 days   . Long-term BP goal normotensive  Hyperlipidemia  Home Lipid lowering medication: Lipitor 10 mg daily  LDL 107, goal < 70  Current lipid lowering medication: Lipitor 80 mg daily   Continue statin at discharge  Other Stroke Risk Factors  Advanced age  Former cigarette smoker - quit  ETOH use, advised to drink no more than 1 alcoholic beverage per day.  Overweight, recommend weight loss, diet and exercise as appropriate   Substance abuse  Other Active Problems  Hyperdense mass at the inferior aspect of the right parotid gland, measuring 1.8 by 1.0 cm, most commonly a Warthin tumor. Histologic sampling should be considered due to the degree of overlap of imaging features between benign and malignant parotid tumors.   Elevated ammonia - 47  Hypokalemia - 3.2 - likely secondary to HCTZ - supplement. Recheck in AM.  Creatinine - 1.33 - may be secondary to HCTZ and poor fluid intake.  Hospital day #1  Neurology will sign off. Please call with questions. Pt will follow up with stroke clinic NP at [facility] in about 4 weeks. Thanks for the consult.  Rosalin Hawking, MD PhD Stroke Neurology 08/28/2019 4:40 PM    To contact Stroke Continuity provider, please refer to [email or web address] After hours, contact General Neurology

## 2019-08-29 DIAGNOSIS — E785 Hyperlipidemia, unspecified: Secondary | ICD-10-CM

## 2019-08-29 DIAGNOSIS — N179 Acute kidney failure, unspecified: Secondary | ICD-10-CM

## 2019-08-29 DIAGNOSIS — E876 Hypokalemia: Secondary | ICD-10-CM

## 2019-08-29 DIAGNOSIS — F1410 Cocaine abuse, uncomplicated: Secondary | ICD-10-CM | POA: Diagnosis present

## 2019-08-29 DIAGNOSIS — R410 Disorientation, unspecified: Secondary | ICD-10-CM

## 2019-08-29 DIAGNOSIS — Z720 Tobacco use: Secondary | ICD-10-CM

## 2019-08-29 HISTORY — DX: Acute kidney failure, unspecified: N17.9

## 2019-08-29 LAB — BASIC METABOLIC PANEL
Anion gap: 12 (ref 5–15)
BUN: 11 mg/dL (ref 8–23)
CO2: 26 mmol/L (ref 22–32)
Calcium: 9.1 mg/dL (ref 8.9–10.3)
Chloride: 101 mmol/L (ref 98–111)
Creatinine, Ser: 1.53 mg/dL — ABNORMAL HIGH (ref 0.61–1.24)
GFR calc Af Amer: 56 mL/min — ABNORMAL LOW (ref >60)
GFR calc non Af Amer: 48 mL/min — ABNORMAL LOW (ref >60)
Glucose, Bld: 124 mg/dL — ABNORMAL HIGH (ref 70–99)
Potassium: 3 mmol/L — ABNORMAL LOW (ref 3.5–5.1)
Sodium: 139 mmol/L (ref 135–145)

## 2019-08-29 LAB — MAGNESIUM: Magnesium: 2 mg/dL (ref 1.7–2.4)

## 2019-08-29 MED ORDER — POTASSIUM CHLORIDE 20 MEQ/15ML (10%) PO SOLN
40.0000 meq | Freq: Once | ORAL | Status: AC
  Administered 2019-08-29: 40 meq via ORAL
  Filled 2019-08-29: qty 30

## 2019-08-29 MED ORDER — SODIUM CHLORIDE 0.9 % IV SOLN
INTRAVENOUS | Status: AC
  Administered 2019-08-29: 10:00:00 via INTRAVENOUS

## 2019-08-29 NOTE — Progress Notes (Signed)
Pt noted with legs through side rails during bedside report. This nurse remained with patient while c.n.a. assistance entered to assist with patient. Safety sitter came into room with linens. Pt restraints d/c'd at this time.

## 2019-08-29 NOTE — Progress Notes (Signed)
Pt states agreeable to taking potassium tablet with food. However, when given pt would swallow food and take tablet out of mouth.

## 2019-08-29 NOTE — Progress Notes (Signed)
[facility]  PROGRESS NOTE  [name] HQR:[id number] DOB: Jan 10, 1956 DOA: 08/26/2019 PCP: [name], [name] Sa, MD Admit date - 08/26/2019   Admitting Physician [name] Gravel, MD  Outpatient Primary MD for the patient is [name], [name] Sa, MD  LOS - 2 Brief Narrative   [name] is a 63 y.o. year old male with medical history significant for hypertension and previous stroke with some residual deficit who presented on 08/26/2019 with worsening confusion and several falls at home x1 day and was found to have hypertensive emergency with large occipital stroke. In ED initially noted to be hypertensive with blood pressure 230/125 and creatinine of 1.3.  Upon evaluation in the ED patient was intermittently following commands with noticeable facial droop and slurred speech per family consistent with his baseline of previous stroke.  CT head initially revealed age-indeterminate infarct.  Given persistently high blood pressure and intermittent confusion neurology was consulted who recommended permissive hypertension and ordered MRI showed a large occipital lobe stroke.   Hospital course complicated by continued confusion. Patient continues to get up from bed and urinate. He fell in the ED and stuck head on door. CT head done and showed no acute change from prior stroke  Subjective  [name] today has no acute complaints. Still able to tell me he is at [facility]. , No headache, No chest pain, No abdominal pain - No Nausea,  A & P   Large PCA territory infarct on the right, stable.  History of previous stroke.  Found to be cocaine positive.  Likely in the setting of hypertensive emergency and substance abuse, likely thrombotic could also be embolic.  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  Preserved EF on TTE without PFO.  A1c 6.2, LDL 107, UDS positive for cocaine -Neurology f/u with [facility] in 4 weeks, rec 30 day cardiac event monitoring for Afib rule out (message placed to cardiology to  arrange) -Frequent neuro checks -Aspirin 325 mg p.o. or 300 mg PR,  plavix 75 mg qd for DAPT x 3 months,then plavix alone per neurology -Monitor on telemetry,  --PT recs CIR, inpt rehab consulted  Confusion, resolved. Will likely be prone to bouts of confusion given large PCA stroke  Currently oriented to self, place and following commands. -Repeat CT head shows no evolution or worsening of known disease after fall in ED on admission -Avoid further sedatives -no longer requiring restraints, high fall risk, continue sitter  Hypertension, stable.  Allow permissive hypertension, okay if less than 220/120 over the next 5 to 7 days -Lisinopril, HCTZ, labetalol as needed  Substance abuse. 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  Incidentally found on imaging for stroke work-up -Histologic sampling should be considered due to degree of overlap of imaging features between benign and malignant parotid tumors by ENT  Elevated alk phos.  No elevation in LFTs or bilirubin.  GGT unremarkable.  No abdominal exam pain -Closely monitor  Renal insufficiency, acute increase. Likely AKI related to decreased PO in setting of stroke -Unclear if acute -Last creatinine from 2014 was 1.14 -1.32 on admission, now 1.52 --Start IVF x 24 hours, encouraged PO intake -Avoid nephrotoxins, monitor creatinine on BMP, has normal output   Family Communication  : will update family  Code Status : Full code  Disposition Plan  : PT recs CIR, close  monitoring of neuro status, continue aspirin and Plavix, IVF for AKI  Consults  : Neurology  Procedures  : TTE  DVT Prophylaxis  : Aspirin, Plavix, SCDs  Lab Results  Component Value Date   PLT 281 08/27/2019    Diet :  Diet Order            DIET DYS 2 Room service  appropriate? Yes; Fluid consistency: Thin  Diet effective now               Inpatient Medications Scheduled Meds: .  stroke: mapping our early stages of recovery book   Does not apply Once  . aspirin  300 mg Rectal Daily   Or  . aspirin  325 mg Oral Daily  . atorvastatin  80 mg Oral q1800  . clopidogrel  75 mg Oral Daily  . hydrochlorothiazide  25 mg Oral Daily  . lisinopril  10 mg Oral Daily  . potassium chloride  20 mEq Oral BID   Continuous Infusions: . sodium chloride 75 mL/hr at 08/29/19 0959   PRN Meds:.acetaminophen **OR** acetaminophen (TYLENOL) oral liquid 160 mg/5 mL **OR** acetaminophen, labetalol  Antibiotics  :   Anti-infectives (From admission, onward)   None       Objective   Vitals:   08/28/19 1100 08/28/19 1400 08/28/19 1646 08/29/19 0925  BP:   (!) 125/92 (!) 148/87  Pulse:   90 85  Resp: '12 15 15 16  ' Temp:   98.5 F (36.9 C) 98.8 F (37.1 C)  TempSrc:   Oral Oral  SpO2:   97% 98%    SpO2: 98 %  Wt Readings from Last 3 Encounters:  06/10/13 85.3 kg  05/14/13 80.8 kg  03/12/13 83.9 kg     Intake/Output Summary (Last 24 hours) at 08/29/2019 1308 Last data filed at 08/29/2019 0514 Gross per 24 hour  Intake 120 ml  Output 625 ml  Net -505 ml    Physical Exam:  Awake Alert, oriented to place, self Decreased attention Left face droop, slurred speech Moves all extremities against gravity on command [place].AT, Symmetrical Chest wall movement, Good air movement bilaterally on room air, CTAB +ve B.Sounds, Abd Soft, No tenderness,  No Cyanosis, Clubbing or edema, No new Rash or bruise     I have personally reviewed the following:   Data Reviewed:  CBC Recent Labs  Lab 08/26/19 2047 08/27/19 0846  WBC 8.5 8.3  HGB 17.0 15.9  HCT 52.3* 49.5  PLT 265 281  MCV 84.5 83.8  MCH 27.5 26.9  MCHC 32.5 32.1  RDW 14.5 14.3    Chemistries  Recent Labs  Lab 08/26/19 2047 08/27/19 0846 08/28/19 0237 08/29/19 0349  NA 140 141 136  139  K 3.5 3.5 3.2* 3.0*  CL 106 105 99 101  CO2 '23 25 22 26  ' GLUCOSE 100* 100* 106* 124*  BUN 9 7* 6* 11  CREATININE 1.32* 1.30* 1.33* 1.53*  CALCIUM 9.2 9.1 9.2 9.1  MG  --   --   --  2.0  AST 16 20  --   --   ALT 25 24  --   --   ALKPHOS 140* 140*  --   --   BILITOT 1.1 1.9*  --   --    ------------------------------------------------------------------------------------------------------------------ Recent Labs    08/27/19 0846  CHOL 167  HDL 40*  LDLCALC 107*  TRIG 98  CHOLHDL 4.2    Lab Results  Component Value Date   HGBA1C 6.2 (  H) 08/27/2019   ------------------------------------------------------------------------------------------------------------------ No results for input(s): TSH, T4TOTAL, T3FREE, THYROIDAB in the last 72 hours.  Invalid input(s): FREET3 ------------------------------------------------------------------------------------------------------------------ No results for input(s): VITAMINB12, FOLATE, FERRITIN, TIBC, IRON, RETICCTPCT in the last 72 hours.  Coagulation profile No results for input(s): INR, PROTIME in the last 168 hours.  No results for input(s): DDIMER in the last 72 hours.  Cardiac Enzymes No results for input(s): CKMB, TROPONINI, MYOGLOBIN in the last 168 hours.  Invalid input(s): CK ------------------------------------------------------------------------------------------------------------------ No results found for: BNP  Micro Results Recent Results (from the past 240 hour(s))  SARS CORONAVIRUS 2 (TAT 6-24 HRS) Nasopharyngeal Nasopharyngeal Swab     Status: None   Collection Time: 08/26/19  9:54 PM   Specimen: Nasopharyngeal Swab  Result Value Ref Range Status   SARS Coronavirus 2 NEGATIVE NEGATIVE Final    Comment: (NOTE) SARS-CoV-2 target nucleic acids are NOT DETECTED. The SARS-CoV-2 RNA is generally detectable in upper and lower respiratory specimens during the acute phase of infection. Negative results do not  preclude SARS-CoV-2 infection, do not rule out co-infections with other pathogens, and should not be used as the sole basis for treatment or other patient management decisions. Negative results must be combined with clinical observations, patient history, and epidemiological information. The expected result is Negative. Fact Sheet for Patients: SugarRoll.be Fact Sheet for Healthcare Providers: https://www.woods-mathews.com/ This test is not yet approved or cleared by the [place] FDA and  has been authorized for detection and/or diagnosis of SARS-CoV-2 by FDA under an Emergency Use Authorization (EUA). This EUA will remain  in effect (meaning this test can be used) for the duration of the COVID-19 declaration under Section 56 4(b)(1) of the Act, 21 U.S.C. section 360bbb-3(b)(1), unless the authorization is terminated or revoked sooner. Performed at [facility], [place] [address], [place], [place] [address]     Radiology Reports CT ANGIO HEAD W OR WO CONTRAST  Result Date: 08/27/2019 CLINICAL DATA:  Stroke follow-up. Known right PCA stroke. EXAM: CT ANGIOGRAPHY HEAD AND NECK TECHNIQUE: Multidetector CT imaging of the head and neck was performed using the standard protocol during bolus administration of intravenous contrast. Multiplanar CT image reconstructions and MIPs were obtained to evaluate the vascular anatomy. Carotid stenosis measurements (when applicable) are obtained utilizing NASCET criteria, using the distal internal carotid diameter as the denominator. CONTRAST:  30m OMNIPAQUE IOHEXOL 350 MG/ML SOLN COMPARISON:  None. FINDINGS: CTA NECK FINDINGS SKELETON: There is no bony spinal canal stenosis. No lytic or blastic lesion. 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 UPPER CHEST: No pneumothorax or pleural effusion. No nodules or masses. AORTIC ARCH: There is mild calcific  atherosclerosis of the aortic arch. There is no aneurysm, dissection or hemodynamically significant stenosis of the visualized portion of the aorta. Conventional 3 vessel aortic branching pattern. The visualized proximal subclavian arteries are widely patent. RIGHT CAROTID SYSTEM: Normal without aneurysm, dissection or stenosis. LEFT CAROTID SYSTEM: Normal without aneurysm, dissection or stenosis. VERTEBRAL ARTERIES: Right dominant configuration. Both origins are clearly patent. There is no dissection, occlusion or flow-limiting stenosis to the skull base (V1-V3 segments). CTA HEAD FINDINGS POSTERIOR CIRCULATION: --Vertebral arteries: Normal V4 segments. --Posterior inferior cerebellar arteries (PICA): Patent origins from the vertebral arteries. --Anterior inferior cerebellar arteries (AICA): Patent origins from the basilar artery. --Basilar artery: Normal. --Superior cerebellar arteries: Normal. --Posterior cerebral arteries: The right PCA is occluded at the P1 P2 junction. ANTERIOR CIRCULATION: --Intracranial internal carotid arteries:  Atherosclerotic calcification of the internal carotid arteries at the skull base without hemodynamically significant stenosis. --Anterior cerebral arteries (ACA): Normal. Absent right A1 segment, normal variant --Middle cerebral arteries (MCA): The M1 segments are normal. There is multifocal severe stenosis of the M2 segments bilaterally. VENOUS SINUSES: As permitted by contrast timing, patent. ANATOMIC VARIANTS: None Review of the MIP images confirms the above findings. IMPRESSION: 1. 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 2. Bilateral multifocal severe M2 segment stenoses. 3. 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 Histologic sampling should be considered due to the degree of overlap of imaging features between benign and malignant parotid tumors. 4. Aortic  Atherosclerosis (ICD10-I70.0). Electronically Signed   By: Ulyses Jarred M.D.   On: 08/27/2019 03:34   CT Head Wo Contrast  Result Date: 08/27/2019 CLINICAL DATA:  History of cocaine abuse.  Status post fall. EXAM: CT HEAD WITHOUT CONTRAST TECHNIQUE: Contiguous axial images were obtained from the base of the skull through the vertex without intravenous contrast. COMPARISON:  Brain MRI 08/27/2019.  Head CT scan 08/26/2019. FINDINGS: Brain: No evidence of hemorrhage, hydrocephalus, extra-axial collection or mass lesion/mass effect. Acute right PCA territory infarct without hemorrhagic transformation is again seen. Atrophy, chronic microvascular ischemic change, remote left ACA infarct and remote bilateral cerebellar infarcts, larger on the right also again seen. Vascular: Atherosclerosis. Skull: Intact.  No focal lesion. Sinuses/Orbits: Negative. Other: None. IMPRESSION: Acute right PCA territory infarct is identified as seen on the comparison examinations. Negative for hemorrhagic transformation. Atrophy, chronic microvascular ischemic change and remote infarcts as described above. Electronically Signed   By: Inge Rise M.D.   On: 08/27/2019 12:54   CT Head  Result Date: 08/26/2019 CLINICAL DATA:  Status post fall, altered mental status. EXAM: CT HEAD WITHOUT CONTRAST TECHNIQUE: Contiguous axial images were obtained from the base of the skull through the vertex without intravenous contrast. COMPARISON:  Feb 03, 2013 FINDINGS: Brain: There is no midline shift, hydrocephalus, or mass. No acute hemorrhage is identified. There is lucency identified in the right cerebellum, likely due to an infarct, age indeterminate. Old infarct is identified in the left cerebellum. Encephalomalacia of the left frontal lobe is identified consistent with old infarct. Small old lacunar infarctions are identified in bilateral basal ganglia. Vascular: No hyperdense vessel is noted. Skull: Normal. Negative for fracture or  focal lesion. Sinuses/Orbits: No acute finding. Other: None. IMPRESSION: 1. There is lucency identified in the right cerebellum, likely due to an infarct, age indeterminate. 2. Old infarcts identified in the left frontal lobe, left cerebellum, and bilateral basal ganglia. Electronically Signed   By: Abelardo Diesel M.D.   On: 08/26/2019 20:05   CT ANGIO NECK W OR WO CONTRAST  Result Date: 08/27/2019 CLINICAL DATA:  Stroke follow-up. Known right PCA stroke. EXAM: CT ANGIOGRAPHY HEAD AND NECK TECHNIQUE: Multidetector CT imaging of the head and neck was performed using the standard protocol during bolus administration of intravenous contrast. Multiplanar CT image reconstructions and MIPs were obtained to evaluate the vascular anatomy. Carotid stenosis measurements (when applicable) are obtained utilizing NASCET criteria, using the distal internal carotid diameter as the denominator. CONTRAST:  39m OMNIPAQUE IOHEXOL 350 MG/ML SOLN COMPARISON:  None. FINDINGS: CTA NECK FINDINGS SKELETON: There is no bony spinal canal stenosis. No lytic or blastic lesion. 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  UPPER CHEST: No pneumothorax or pleural effusion. No nodules or masses. AORTIC ARCH: There is mild calcific atherosclerosis of the aortic arch. There is no aneurysm, dissection or hemodynamically significant stenosis of the visualized portion of the aorta. Conventional 3 vessel aortic branching pattern. The visualized proximal subclavian arteries are widely patent. RIGHT CAROTID SYSTEM: Normal without aneurysm, dissection or stenosis. LEFT CAROTID SYSTEM: Normal without aneurysm, dissection or stenosis. VERTEBRAL ARTERIES: Right dominant configuration. Both origins are clearly patent. There is no dissection, occlusion or flow-limiting stenosis to the skull base (V1-V3 segments). CTA HEAD FINDINGS POSTERIOR CIRCULATION: --Vertebral arteries: Normal V4 segments.  --Posterior inferior cerebellar arteries (PICA): Patent origins from the vertebral arteries. --Anterior inferior cerebellar arteries (AICA): Patent origins from the basilar artery. --Basilar artery: Normal. --Superior cerebellar arteries: Normal. --Posterior cerebral arteries: The right PCA is occluded at the P1 P2 junction. ANTERIOR CIRCULATION: --Intracranial internal carotid arteries: Atherosclerotic calcification of the internal carotid arteries at the skull base without hemodynamically significant stenosis. --Anterior cerebral arteries (ACA): Normal. Absent right A1 segment, normal variant --Middle cerebral arteries (MCA): The M1 segments are normal. There is multifocal severe stenosis of the M2 segments bilaterally. VENOUS SINUSES: As permitted by contrast timing, patent. ANATOMIC VARIANTS: None Review of the MIP images confirms the above findings. IMPRESSION: 1. 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 2. Bilateral multifocal severe M2 segment stenoses. 3. 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 Histologic sampling should be considered due to the degree of overlap of imaging features between benign and malignant parotid tumors. 4. Aortic Atherosclerosis (ICD10-I70.0). Electronically Signed   By: Ulyses Jarred M.D.   On: 08/27/2019 03:34   MR BRAIN WO CONTRAST  Result Date: 08/27/2019 CLINICAL DATA:  Encephalopathy EXAM: MRI HEAD WITHOUT CONTRAST TECHNIQUE: Multiplanar, multiecho pulse sequences of the brain and surrounding structures were obtained without intravenous contrast. COMPARISON:  Brain MRI 02/03/2013 and head CT 08/26/2019 FINDINGS: Brain: There is a large right PCA territory infarct predominantly affecting the medial temporal lobe and occipital lobe with smaller foci of ischemia in the right thalamus and right middle cerebellar peduncle. There is a large area of encephalomalacia  within the left anterior cerebral artery territory. There is diffuse confluent white matter hyperintensity compatible with ischemic microangiopathy. There are old bilateral cerebellar infarcts. Vascular: There is loss of the normal right PCA flow void. There are 10-20 scattered foci of chronic microhemorrhage in a predominantly peripheral distribution. No acute hemorrhage. Skull and upper cervical spine: The bone marrow signal of the cranium and upper cervical vertebrae is normal. There is no skull base lesion. The visualized upper cervical spinal cord is normal. Sinuses/Orbits: There is no paranasal sinus fluid level or advanced mucosal thickening. There is no mastoid or middle ear effusion. The orbits are normal. Other: None IMPRESSION: 1. Large, acute right PCA territory infarct involving the medial temporal lobe and occipital lobe with additional smaller foci of acute ischemia within the right thalamus and right middle cerebellar peduncle. 2. Loss of the normal right PCA flow void consistent with occlusion. 3. Multiple foci of chronic microhemorrhage in a predominantly peripheral distribution. 4. No acute hemorrhage or mass effect. Electronically Signed   By: Ulyses Jarred M.D.   On: 08/27/2019 02:13   XR Chest Single View  Result Date: 08/26/2019 CLINICAL DATA:  Altered behavior, confusion and falls today EXAM: PORTABLE CHEST 1 VIEW COMPARISON:  Radiograph Feb 03, 2013 FINDINGS:  Nonspecific elevation of the right hemidiaphragm with adjacent opacity likely reflecting atelectasis. No consolidation, convincing features of edema, pneumothorax or effusion. The aorta is calcified. The remaining cardiomediastinal contours are unremarkable. Degenerative changes are present in the imaged spine and shoulders. No acute osseous or soft tissue abnormality. IMPRESSION: 1. Nonspecific elevation of the right hemidiaphragm with adjacent opacity likely reflecting atelectasis. 2.  No other acute cardiopulmonary abnormality.  Electronically Signed   By: Lovena Le M.D.   On: 08/26/2019 20:30   ECHOCARDIOGRAM COMPLETE  Result Date: 08/27/2019   ECHOCARDIOGRAM REPORT   Patient Name:   SHERMAINE BRIGHAM Date of Exam: 08/27/2019 Medical Rec #:  [id number]     Height:       69.0 in Accession #:    [id number]    Weight:       188.0 lb Date of Birth:  1956-05-04     BSA:          2.01 m Patient Age:    18 years      BP:           132/101 mmHg Patient Gender: M             HR:           61 bpm. Exam Location:  Inpatient Procedure: 2D Echo Indications:    Stroke 434.91/I163.9  History:        Patient has prior history of Echocardiogram examinations, most                 recent 02/09/2013. Stroke; Risk Factors:Hypertension.  Sonographer:    Clayton Lefort RDCS (AE) Referring Phys: [id number] [name]  Sonographer Comments: Patient unable to hold breath when requested. IMPRESSIONS  1. Left ventricular ejection fraction, by visual estimation, is 65 to 70%. The left ventricle has normal function. There is severely increased left ventricular hypertrophy.  2. Left ventricular diastolic parameters are consistent with Grade I diastolic dysfunction (impaired relaxation).  3. The left ventricle has no regional wall motion abnormalities.  4. Global right ventricle has normal systolic function.The right ventricular size is normal. No increase in right ventricular wall thickness.  5. Left atrial size was normal.  6. Right atrial size was normal.  7. The mitral valve is normal in structure. Trivial mitral valve regurgitation.  8. The tricuspid valve is normal in structure. Tricuspid valve regurgitation is not demonstrated.  9. The aortic valve is normal in structure. Aortic valve regurgitation is not visualized. Mild aortic valve sclerosis without stenosis. 10. The pulmonic valve was grossly normal. Pulmonic valve regurgitation is not visualized. 11. The atrial septum is grossly normal. FINDINGS  Left Ventricle: Left ventricular ejection fraction, by visual  estimation, is 65 to 70%. The left ventricle has normal function. The left ventricle has no regional wall motion abnormalities. There is severely increased left ventricular hypertrophy. Asymmetric left ventricular hypertrophy. Left ventricular diastolic parameters are consistent with Grade I diastolic dysfunction (impaired relaxation). Right Ventricle: The right ventricular size is normal. No increase in right ventricular wall thickness. Global RV systolic function is has normal systolic function. Left Atrium: Left atrial size was normal in size. Right Atrium: Right atrial size was normal in size Pericardium: There is no evidence of pericardial effusion. Mitral Valve: The mitral valve is normal in structure. Trivial mitral valve regurgitation. Tricuspid Valve: The tricuspid valve is normal in structure. Tricuspid valve regurgitation is not demonstrated. Aortic Valve: The aortic valve is normal in structure. Aortic valve regurgitation is not visualized. Mild aortic  valve sclerosis is present, with no evidence of aortic valve stenosis. Aortic valve mean gradient measures 4.0 mmHg. Aortic valve peak gradient measures 7.2 mmHg. Aortic valve area, by VTI measures 2.46 cm. Pulmonic Valve: The pulmonic valve was grossly normal. Pulmonic valve regurgitation is not visualized. Pulmonic regurgitation is not visualized. Aorta: The aortic root and ascending aorta are structurally normal, with no evidence of dilitation. IAS/Shunts: The atrial septum is grossly normal.  LEFT VENTRICLE PLAX 2D LVIDd:         3.90 cm  Diastology LVIDs:         2.20 cm  LV e' lateral:   6.53 cm/s LV PW:         1.40 cm  LV E/e' lateral: 9.3 LV IVS:        1.90 cm  LV e' medial:    5.87 cm/s LVOT diam:     2.00 cm  LV E/e' medial:  10.4 LV SV:         50 ml LV SV Index:   24.19 LVOT Area:     3.14 cm  RIGHT VENTRICLE RV S prime:     11.00 cm/s TAPSE (M-mode): 1.7 cm LEFT ATRIUM           Index       RIGHT ATRIUM           Index LA diam:      2.70  cm 1.34 cm/m  RA Area:     15.00 cm LA Vol (A2C): 65.5 ml 32.55 ml/m RA Volume:   36.00 ml  17.89 ml/m LA Vol (A4C): 32.6 ml 16.20 ml/m  AORTIC VALVE AV Area (Vmax):    2.67 cm AV Area (Vmean):   2.63 cm AV Area (VTI):     2.46 cm AV Vmax:           134.00 cm/s AV Vmean:          91.000 cm/s AV VTI:            0.290 m AV Peak Grad:      7.2 mmHg AV Mean Grad:      4.0 mmHg LVOT Vmax:         114.00 cm/s LVOT Vmean:        76.100 cm/s LVOT VTI:          0.227 m LVOT/AV VTI ratio: 0.78  AORTA Ao Root diam: 3.40 cm Ao Asc diam:  2.90 cm MITRAL VALVE MV Area (PHT): 2.66 cm             SHUNTS MV PHT:        82.65 msec           Systemic VTI:  0.23 m MV Decel Time: 285 msec             Systemic Diam: 2.00 cm MV E velocity: 61.00 cm/s 103 cm/s MV A velocity: 84.40 cm/s 70.3 cm/s MV E/A ratio:  0.72       1.5  Mertie Moores MD Electronically signed by Mertie [place] MD Signature Date/Time: 08/27/2019/12:29:31 PM    Final      Time Spent in minutes  30     Desiree Hane M.D on 08/29/2019 at 1:08 PM  To page go to [email or web address] - password [email or web address]

## 2019-08-30 ENCOUNTER — Other Ambulatory Visit: Payer: Self-pay | Admitting: Student

## 2019-08-30 DIAGNOSIS — I63531 Cerebral infarction due to unspecified occlusion or stenosis of right posterior cerebral artery: Secondary | ICD-10-CM

## 2019-08-30 DIAGNOSIS — I6310 Cerebral infarction due to embolism of unspecified precerebral artery: Secondary | ICD-10-CM

## 2019-08-30 LAB — BASIC METABOLIC PANEL
Anion gap: 11 (ref 5–15)
BUN: 13 mg/dL (ref 8–23)
CO2: 25 mmol/L (ref 22–32)
Calcium: 9.2 mg/dL (ref 8.9–10.3)
Chloride: 103 mmol/L (ref 98–111)
Creatinine, Ser: 1.29 mg/dL — ABNORMAL HIGH (ref 0.61–1.24)
GFR calc Af Amer: >60 mL/min (ref >60)
GFR calc non Af Amer: 59 mL/min — ABNORMAL LOW (ref >60)
Glucose, Bld: 119 mg/dL — ABNORMAL HIGH (ref 70–99)
Potassium: 3 mmol/L — ABNORMAL LOW (ref 3.5–5.1)
Sodium: 139 mmol/L (ref 135–145)

## 2019-08-30 NOTE — Evaluation (Signed)
Occupational Therapy Evaluation Patient Details Name: [name] MRN: [id number] DOB: 23-Apr-1956 Today's Date: 08/30/2019    History of Present Illness [name] is a 63 y.o. year old male with medical history significant for hypertension and previous stroke with some residual deficit who presented on 08/26/2019 with worsening confusion and several falls at home x1 day and was found to have hypertensive emergency with large occipital stroke.   Clinical Impression   Pt admitted with the above diagnosis and has the deficits outlined below. Pt would benefit from cont OT to increase independence with basic adls and adl transfers to a level that pt can return home with his wife after significant rehab. Pt with significant deficits in areas of vision, perception, motor planning, problem solving, LUE functional controlled use, communication and balance affecting his ability to complete basic adls and mobility.  Pt lives with his wife who works from home and pt states he works as well and was previously independent despite a past CVA.  First focus will be increasing independence with feeding in order to meet nutritional goals.  Feel pt would be a good rehab candidate if he has 24/7 assist after d/c.    Follow Up Recommendations  CIR;Supervision/Assistance - 24 hour    Equipment Recommendations  Other (comment)(tbd)    Recommendations for Other Services       Precautions / Restrictions Precautions Precautions: Fall Precaution Comments: fell in ED Restrictions Weight Bearing Restrictions: No      Mobility Bed Mobility Overal bed mobility: Needs Assistance Bed Mobility: Rolling;Supine to Sit;Sit to Supine Rolling: Max assist;+2 for physical assistance;+2 for safety/equipment   Supine to sit: Max assist;HOB elevated Sit to supine: Mod assist   General bed mobility comments: Pt participated much more in getting back into bed than getting up to side of bed or holding self at EOB.   Unsure if this is motiviation issue or a praxis/motor planning issue.  Transfers Overall transfer level: Needs assistance Equipment used: None Transfers: Sit to/from Stand Sit to Stand: Max assist         General transfer comment: no assist to stand    Balance Overall balance assessment: Needs assistance Sitting-balance support: Feet supported Sitting balance-Leahy Scale: Poor Sitting balance - Comments: pt required full assist to sit with a R lean. Postural control: Right lateral lean;Posterior lean   Standing balance-Leahy Scale: Zero Standing balance comment: no assist to stand                           ADL either performed or assessed with clinical judgement   ADL Overall ADL's : Needs assistance/impaired Eating/Feeding: Maximal assistance;Sitting;Cueing for compensatory techinques Eating/Feeding Details (indicate cue type and reason): Pt puts a lot of food in his mouth at one time.  Due to visual deficits, he does not appear to see what is on his tray when taking fork to tray.  Pt grabs bowls with L hand but unable to control what he then does with the bowl.  Pt can take food to mouth when food put on fork or spoon for him.  Pt chews food for extended amount of time before swallowing but did much better with puree consistency when it came to swallowing. Grooming: Wash/dry face;Minimal assistance;Cueing for compensatory techniques;Sitting Grooming Details (indicate cue type and reason): hand over hand assist to get pt started then able to wash face after set up.   Upper Body Bathing: Maximal assistance;Sitting   Lower Body  Bathing: Total assistance;Sit to/from stand;Cueing for compensatory techniques Lower Body Bathing Details (indicate cue type and reason): Pt unable to sit on EOB without total assist although has the strength to do so.  Pt total assist to sit EOB but proceeded to get back in bed with mod assist lifting both legs into bed with minimal assist.    Upper Body Dressing : Maximal assistance;Sitting   Lower Body Dressing: Total assistance;+2 for physical assistance;Sit to/from stand;Cueing for compensatory techniques Lower Body Dressing Details (indicate cue type and reason): vision, motor planning, balance and problem solving all limiting pts ability to dress self.   Toilet Transfer Details (indicate cue type and reason): unable to attempt as pt would not assist therapist with sitting on EOB this session.  Pt was total assist to sit EOB.  May partly be behavioral but do feel his ability to coordinate and motor plan keeping balance on EOB is very difficult. Toileting- Clothing Manipulation and Hygiene: Total assistance;Sit to/from stand;Cueing for compensatory techniques       Functional mobility during ADLs: Maximal assistance General ADL Comments: Pt requires a great amount of assist for adls due to decreased vision, motor planning, problem solving and balance.       Vision Patient Visual Report: (unable to say) Vision Assessment?: Yes Eye Alignment: Within Functional Limits Ocular Range of Motion: Impaired-to be further tested in functional context Alignment/Gaze Preference: Gaze left;Other (comment)(which is not standard with a L hemianopsia) Tracking/Visual Pursuits: Impaired - to be further tested in functional context Convergence: Impaired - to be further tested in functional context Visual Fields: Left homonymous hemianopsia;Left visual field deficit Additional Comments: Pt reaches for things that are not there.  Pt uses L hand to pick up items that are not there and put them in his mouth...and chew them.  May be visual/perceptual and also may be more of apraxia with LUE.     Perception Perception Perception Tested?: Yes Perception Deficits: Inattention/neglect Inattention/Neglect: Does not attend to left side of body;Impaired- to be further tested in functional context Spatial deficits: uncoordinated LUE movements.  Pt uses  LUE but not sure pt knows how he is using it or has any control over those movements.   Praxis Praxis Praxis tested?: Deficits Deficits: Ideomotor;Organization;Limb apraxia    Pertinent Vitals/Pain Pain Assessment: No/denies pain     Hand Dominance Right   Extremity/Trunk Assessment Upper Extremity Assessment Upper Extremity Assessment: LUE deficits/detail LUE Deficits / Details: AROM appears WFL but not to command. Pt would move arm around on own in all ranges.  Pt presents with a mild hemiballismus.  Pt could use L hand to reach into mouth to get a pill out with find motor skill but gross motor skills are impaired.  Pt states the L side feels different than the R side.   LUE Sensation: decreased light touch;decreased proprioception LUE Coordination: decreased gross motor   Lower Extremity Assessment Lower Extremity Assessment: Defer to PT evaluation   Cervical / Trunk Assessment Cervical / Trunk Assessment: Normal   Communication Communication Communication: Receptive difficulties   Cognition Arousal/Alertness: Awake/alert Behavior During Therapy: Flat affect Overall Cognitive Status: Impaired/Different from baseline Area of Impairment: Orientation;Attention;Memory;Following commands;Safety/judgement;Awareness;Problem solving                 Orientation Level: Time;Situation Current Attention Level: Focused Memory: Decreased recall of precautions;Decreased short-term memory Following Commands: Follows one step commands inconsistently;Follows one step commands with increased time Safety/Judgement: Decreased awareness of safety;Decreased awareness of deficits Awareness: Intellectual Problem  Solving: Decreased initiation;Requires verbal cues;Requires tactile cues General Comments: Pt inconsistent with cognitive and physical abilities.   General Comments  Pt with very inconsistent participation in therapy and unsure if this is behavioral or due to severe motor planning  deficits or both.     Exercises     Shoulder Instructions      Home Living Family/patient expects to be discharged to:: Private residence Living Arrangements: Spouse/significant other Available Help at Discharge: Family Type of Home: House Home Access: Level entry     Home Layout: One level                   Additional Comments: wife working from home right now, pt reports working at [employer] (questionable historian)  Lives With: Spouse    Prior Functioning/Environment Level of Independence: Independent                 OT Problem List: Impaired balance (sitting and/or standing);Impaired vision/perception;Decreased coordination;Decreased cognition;Decreased safety awareness;Decreased knowledge of use of DME or AE;Decreased knowledge of precautions;Impaired sensation;Impaired UE functional use      OT Treatment/Interventions: Self-care/ADL training;Neuromuscular education;Therapeutic activities;Balance training;DME and/or AE instruction    OT Goals(Current goals can be found in the care plan section) Acute Rehab OT Goals Patient Stated Goal: none OT Goal Formulation: Patient unable to participate in goal setting Time For Goal Achievement: 09/13/19 Potential to Achieve Goals: Good ADL Goals Pt Will Perform Eating: with min assist;sitting Pt Will Perform Grooming: with min assist;sitting Pt Will Perform Upper Body Bathing: with min assist;sitting Pt Will Perform Upper Body Dressing: with min assist;sitting Pt Will Transfer to Toilet: with mod assist;stand pivot transfer;bedside commode Additional ADL Goal #1: Pt will tend to and identify adl items in his L enviroment with min cues.  OT Frequency: Min 2X/week   Barriers to D/C:            Co-evaluation              AM-PAC OT "6 Clicks" Daily Activity     Outcome Measure Help from another person eating meals?: A Lot Help from another person taking care of personal grooming?: A Lot Help from another  person toileting, which includes using toliet, bedpan, or urinal?: Total Help from another person bathing (including washing, rinsing, drying)?: Total Help from another person to put on and taking off regular upper body clothing?: Total Help from another person to put on and taking off regular lower body clothing?: Total 6 Click Score: 8   End of Session Nurse Communication: Mobility status  Activity Tolerance: Patient tolerated treatment well Patient left: in bed;with call bell/phone within reach;with nursing/sitter in room;with bed alarm set  OT Visit Diagnosis: Hemiplegia and hemiparesis;Other symptoms and signs involving the nervous system (R29.898) Hemiplegia - Right/Left: Left Hemiplegia - dominant/non-dominant: Non-Dominant Hemiplegia - caused by: Cerebral infarction                Time: [id number] OT Time Calculation (min): 36 min Charges:  OT General Charges $OT Visit: 1 Visit OT Evaluation $OT Eval Moderate Complexity: 1 Mod OT Treatments $Self Care/Home Management : 8-22 mins  Glenford Peers 08/30/2019, 11:30 AM

## 2019-08-30 NOTE — Progress Notes (Signed)
Ordered 30-day Event Monitor to rule out atrial fibrillation given stroke at the request of Dr. Erlinda Hong (Neurology).

## 2019-08-30 NOTE — Progress Notes (Addendum)
[facility]  PROGRESS NOTE  Juwon Scripter XBJ:[id number] DOB: 1956/05/11 DOA: 08/26/2019 PCP: Alroy Dust, L.[name], MD Admit date - 08/26/2019   Admitting Physician Jani Gravel, MD  Outpatient Primary MD for the patient is Hedrick, L.[name], MD  LOS - 3 Brief Narrative   [name] is a 63 y.o. year old male with medical history significant for hypertension and previous stroke with some residual deficit who presented on 08/26/2019 with worsening confusion and several falls at home x1 day and was found to have hypertensive emergency with large occipital stroke. In ED initially noted to be hypertensive with blood pressure 230/125 and creatinine of 1.3.  Upon evaluation in the ED patient was intermittently following commands with noticeable facial droop and slurred speech per family consistent with his baseline of previous stroke.  CT head initially revealed age-indeterminate infarct.  Given persistently high blood pressure and intermittent confusion neurology was consulted who recommended permissive hypertension and ordered MRI showed a large occipital lobe stroke.   Hospital course complicated by continued confusion. Patient continues to get up from bed and urinate. He fell in the ED and stuck head on door. CT head done and showed no acute change from prior stroke  Subjective  Mr. Narine today has no acute complaints.  Does not want to eat breakfast today still able to tell me he is at [facility]. , No headache, No chest pain, No abdominal pain - No Nausea,  A & P   Large PCA territory infarct on the right, stable.  History of previous stroke.  Found to be cocaine positive.  Likely in the setting of hypertensive emergency and substance abuse, likely thrombotic could also be embolic.  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  Preserved EF on TTE without PFO.  A1c 6.2, LDL 107, UDS positive for cocaine -Neurology f/u with [facility] in 4 weeks, rec 30 day cardiac event monitoring for Afib  rule out (message placed to cardiology to arrange) -Frequent neuro checks -Aspirin 325 mg p.o.,  plavix 75 mg qd for DAPT x 3 months,then plavix alone per neurology -Monitor on telemetry,  --PT recs CIR, inpt rehab consulted and agrees-insurance auth pending  Confusion, resolved. Will likely be prone to bouts of confusion given large PCA stroke  Currently oriented to self, place and following commands. -Repeat CT head shows no evolution or worsening of known disease after fall in ED on admission -Avoid further sedatives -no longer requiring restraints, high fall risk, continue sitter  Hypertension, SBP 140s-160s.  Allow permissive hypertension, okay if less than 220/120 over the next 5 to 7 days -Lisinopril, HCTZ, labetalol as needed  Substance abuse. 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  Incidentally found on imaging for stroke work-up -Histologic sampling should be considered due to degree of overlap of imaging features between benign and malignant parotid tumors by ENT  Elevated alk phos.  No elevation in LFTs or bilirubin.  GGT unremarkable.  No abdominal exam pain -Closely monitor  Renal insufficiency, acute increase. Likely AKI related to decreased PO in setting of stroke -Unclear if acute -Last creatinine from 2014 was 1.14 -1.32 on admission, up to 1.52--given IVF, repeat BMP this afternoon --ncouraged PO intake -Avoid nephrotoxins,  Family Communication  : spoke with wife on phone on 12/14  Code Status : Full code  Disposition Plan  :  PT recs CIR, close monitoring of neuro status, continue aspirin and Plavix, IVF for AKI, medically stablle, awaiting insurance auth  Consults  : Neurology  Procedures  : TTE  DVT Prophylaxis  : Aspirin, Plavix, SCDs  Lab Results   Component Value Date   PLT 281 08/27/2019    Diet :  Diet Order            DIET DYS 2 Room service appropriate? Yes; Fluid consistency: Thin  Diet effective now               Inpatient Medications Scheduled Meds: .  stroke: mapping our early stages of recovery book   Does not apply Once  . aspirin  300 mg Rectal Daily   Or  . aspirin  325 mg Oral Daily  . atorvastatin  80 mg Oral q1800  . clopidogrel  75 mg Oral Daily  . hydrochlorothiazide  25 mg Oral Daily  . lisinopril  10 mg Oral Daily   Continuous Infusions:  PRN Meds:.acetaminophen **OR** acetaminophen (TYLENOL) oral liquid 160 mg/5 mL **OR** acetaminophen, labetalol  Antibiotics  :   Anti-infectives (From admission, onward)   None       Objective   Vitals:   08/29/19 1400 08/29/19 1500 08/29/19 1637 08/30/19 0835  BP:   (!) 144/86 (!) 168/98  Pulse:   72 79  Resp: '15 16 16 12  ' Temp:   98.3 F (36.8 C) 98.5 F (36.9 C)  TempSrc:   Oral Axillary  SpO2:   99% 97%    SpO2: 97 %  Wt Readings from Last 3 Encounters:  06/10/13 85.3 kg  05/14/13 80.8 kg  03/12/13 83.9 kg     Intake/Output Summary (Last 24 hours) at 08/30/2019 1535 Last data filed at 08/30/2019 1416 Gross per 24 hour  Intake 804.17 ml  Output 450 ml  Net 354.17 ml    Physical Exam:  Awake Alert, oriented to place, self, time Decreased attention Left face droop, slurred speech Moves all extremities against gravity on command [place].AT, Symmetrical Chest wall movement, Good air movement bilaterally on room air, CTAB +ve B.Sounds, Abd Soft, No tenderness,  No Cyanosis, Clubbing or edema, No new Rash or bruise     I have personally reviewed the following:   Data Reviewed:  CBC Recent Labs  Lab 08/26/19 2047 08/27/19 0846  WBC 8.5 8.3  HGB 17.0 15.9  HCT 52.3* 49.5  PLT 265 281  MCV 84.5 83.8  MCH 27.5 26.9  MCHC 32.5 32.1  RDW 14.5 14.3    Chemistries  Recent Labs  Lab 08/26/19 2047 08/27/19 0846  08/28/19 0237 08/29/19 0349  NA 140 141 136 139  K 3.5 3.5 3.2* 3.0*  CL 106 105 99 101  CO2 '23 25 22 26  ' GLUCOSE 100* 100* 106* 124*  BUN 9 7* 6* 11  CREATININE 1.32* 1.30* 1.33* 1.53*  CALCIUM 9.2 9.1 9.2 9.1  MG  --   --   --  2.0  AST 16 20  --   --   ALT 25 24  --   --   ALKPHOS 140* 140*  --   --   BILITOT 1.1 1.9*  --   --    ------------------------------------------------------------------------------------------------------------------ No results for input(s): CHOL, HDL, LDLCALC, TRIG, CHOLHDL, LDLDIRECT in the last 72 hours.  Lab Results  Component Value Date   HGBA1C 6.2 (H) 08/27/2019   ------------------------------------------------------------------------------------------------------------------ No results for input(s): TSH, T4TOTAL, T3FREE, THYROIDAB in the last 72  hours.  Invalid input(s): FREET3 ------------------------------------------------------------------------------------------------------------------ No results for input(s): VITAMINB12, FOLATE, FERRITIN, TIBC, IRON, RETICCTPCT in the last 72 hours.  Coagulation profile No results for input(s): INR, PROTIME in the last 168 hours.  No results for input(s): DDIMER in the last 72 hours.  Cardiac Enzymes No results for input(s): CKMB, TROPONINI, MYOGLOBIN in the last 168 hours.  Invalid input(s): CK ------------------------------------------------------------------------------------------------------------------ No results found for: BNP  Micro Results Recent Results (from the past 240 hour(s))  SARS CORONAVIRUS 2 (TAT 6-24 HRS) Nasopharyngeal Nasopharyngeal Swab     Status: None   Collection Time: 08/26/19  9:54 PM   Specimen: Nasopharyngeal Swab  Result Value Ref Range Status   SARS Coronavirus 2 NEGATIVE NEGATIVE Final    Comment: (NOTE) SARS-CoV-2 target nucleic acids are NOT DETECTED. The SARS-CoV-2 RNA is generally detectable in upper and lower respiratory specimens during the acute  phase of infection. Negative results do not preclude SARS-CoV-2 infection, do not rule out co-infections with other pathogens, and should not be used as the sole basis for treatment or other patient management decisions. Negative results must be combined with clinical observations, patient history, and epidemiological information. The expected result is Negative. Fact Sheet for Patients: SugarRoll.be Fact Sheet for Healthcare Providers: https://www.woods-mathews.com/ This test is not yet approved or cleared by the [place] FDA and  has been authorized for detection and/or diagnosis of SARS-CoV-2 by FDA under an Emergency Use Authorization (EUA). This EUA will remain  in effect (meaning this test can be used) for the duration of the COVID-19 declaration under Section 56 4(b)(1) of the Act, 21 U.S.C. section 360bbb-3(b)(1), unless the authorization is terminated or revoked sooner. Performed at [facility], [place] [address] [place], [place] [address]     Radiology Reports CT ANGIO HEAD W OR WO CONTRAST  Result Date: 08/27/2019 CLINICAL DATA:  Stroke follow-up. Known right PCA stroke. EXAM: CT ANGIOGRAPHY HEAD AND NECK TECHNIQUE: Multidetector CT imaging of the head and neck was performed using the standard protocol during bolus administration of intravenous contrast. Multiplanar CT image reconstructions and MIPs were obtained to evaluate the vascular anatomy. Carotid stenosis measurements (when applicable) are obtained utilizing NASCET criteria, using the distal internal carotid diameter as the denominator. CONTRAST:  30m OMNIPAQUE IOHEXOL 350 MG/ML SOLN COMPARISON:  None. FINDINGS: CTA NECK FINDINGS SKELETON: There is no bony spinal canal stenosis. No lytic or blastic lesion. 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 UPPER CHEST: No pneumothorax or pleural effusion. No nodules or  masses. AORTIC ARCH: There is mild calcific atherosclerosis of the aortic arch. There is no aneurysm, dissection or hemodynamically significant stenosis of the visualized portion of the aorta. Conventional 3 vessel aortic branching pattern. The visualized proximal subclavian arteries are widely patent. RIGHT CAROTID SYSTEM: Normal without aneurysm, dissection or stenosis. LEFT CAROTID SYSTEM: Normal without aneurysm, dissection or stenosis. VERTEBRAL ARTERIES: Right dominant configuration. Both origins are clearly patent. There is no dissection, occlusion or flow-limiting stenosis to the skull base (V1-V3 segments). CTA HEAD FINDINGS POSTERIOR CIRCULATION: --Vertebral arteries: Normal V4 segments. --Posterior inferior cerebellar arteries (PICA): Patent origins from the vertebral arteries. --Anterior inferior cerebellar arteries (AICA): Patent origins from the basilar artery. --Basilar artery: Normal. --Superior cerebellar arteries: Normal. --Posterior cerebral arteries: The right PCA is occluded at the P1 P2 junction. ANTERIOR CIRCULATION: --Intracranial internal carotid arteries: Atherosclerotic calcification of the internal carotid arteries at the skull base without hemodynamically significant stenosis. --Anterior cerebral  arteries (ACA): Normal. Absent right A1 segment, normal variant --Middle cerebral arteries (MCA): The M1 segments are normal. There is multifocal severe stenosis of the M2 segments bilaterally. VENOUS SINUSES: As permitted by contrast timing, patent. ANATOMIC VARIANTS: None Review of the MIP images confirms the above findings. IMPRESSION: 1. 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 2. Bilateral multifocal severe M2 segment stenoses. 3. 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 Histologic sampling should be considered due to the degree of overlap of imaging features between benign and  malignant parotid tumors. 4. Aortic Atherosclerosis (ICD10-I70.0). Electronically Signed   By: Ulyses Jarred M.D.   On: 08/27/2019 03:34   CT Head Wo Contrast  Result Date: 08/27/2019 CLINICAL DATA:  History of cocaine abuse.  Status post fall. EXAM: CT HEAD WITHOUT CONTRAST TECHNIQUE: Contiguous axial images were obtained from the base of the skull through the vertex without intravenous contrast. COMPARISON:  Brain MRI 08/27/2019.  Head CT scan 08/26/2019. FINDINGS: Brain: No evidence of hemorrhage, hydrocephalus, extra-axial collection or mass lesion/mass effect. Acute right PCA territory infarct without hemorrhagic transformation is again seen. Atrophy, chronic microvascular ischemic change, remote left ACA infarct and remote bilateral cerebellar infarcts, larger on the right also again seen. Vascular: Atherosclerosis. Skull: Intact.  No focal lesion. Sinuses/Orbits: Negative. Other: None. IMPRESSION: Acute right PCA territory infarct is identified as seen on the comparison examinations. Negative for hemorrhagic transformation. Atrophy, chronic microvascular ischemic change and remote infarcts as described above. Electronically Signed   By: Inge Rise M.D.   On: 08/27/2019 12:54   CT Head  Result Date: 08/26/2019 CLINICAL DATA:  Status post fall, altered mental status. EXAM: CT HEAD WITHOUT CONTRAST TECHNIQUE: Contiguous axial images were obtained from the base of the skull through the vertex without intravenous contrast. COMPARISON:  Feb 03, 2013 FINDINGS: Brain: There is no midline shift, hydrocephalus, or mass. No acute hemorrhage is identified. There is lucency identified in the right cerebellum, likely due to an infarct, age indeterminate. Old infarct is identified in the left cerebellum. Encephalomalacia of the left frontal lobe is identified consistent with old infarct. Small old lacunar infarctions are identified in bilateral basal ganglia. Vascular: No hyperdense vessel is noted. Skull:  Normal. Negative for fracture or focal lesion. Sinuses/Orbits: No acute finding. Other: None. IMPRESSION: 1. There is lucency identified in the right cerebellum, likely due to an infarct, age indeterminate. 2. Old infarcts identified in the left frontal lobe, left cerebellum, and bilateral basal ganglia. Electronically Signed   By: Abelardo Diesel M.D.   On: 08/26/2019 20:05   CT ANGIO NECK W OR WO CONTRAST  Result Date: 08/27/2019 CLINICAL DATA:  Stroke follow-up. Known right PCA stroke. EXAM: CT ANGIOGRAPHY HEAD AND NECK TECHNIQUE: Multidetector CT imaging of the head and neck was performed using the standard protocol during bolus administration of intravenous contrast. Multiplanar CT image reconstructions and MIPs were obtained to evaluate the vascular anatomy. Carotid stenosis measurements (when applicable) are obtained utilizing NASCET criteria, using the distal internal carotid diameter as the denominator. CONTRAST:  59m OMNIPAQUE IOHEXOL 350 MG/ML SOLN COMPARISON:  None. FINDINGS: CTA NECK FINDINGS SKELETON: There is no bony spinal canal stenosis. No lytic or blastic lesion. 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 UPPER CHEST: No pneumothorax or pleural effusion. No nodules or masses. AORTIC ARCH: There is mild calcific  atherosclerosis of the aortic arch. There is no aneurysm, dissection or hemodynamically significant stenosis of the visualized portion of the aorta. Conventional 3 vessel aortic branching pattern. The visualized proximal subclavian arteries are widely patent. RIGHT CAROTID SYSTEM: Normal without aneurysm, dissection or stenosis. LEFT CAROTID SYSTEM: Normal without aneurysm, dissection or stenosis. VERTEBRAL ARTERIES: Right dominant configuration. Both origins are clearly patent. There is no dissection, occlusion or flow-limiting stenosis to the skull base (V1-V3 segments). CTA HEAD FINDINGS POSTERIOR CIRCULATION: --Vertebral  arteries: Normal V4 segments. --Posterior inferior cerebellar arteries (PICA): Patent origins from the vertebral arteries. --Anterior inferior cerebellar arteries (AICA): Patent origins from the basilar artery. --Basilar artery: Normal. --Superior cerebellar arteries: Normal. --Posterior cerebral arteries: The right PCA is occluded at the P1 P2 junction. ANTERIOR CIRCULATION: --Intracranial internal carotid arteries: Atherosclerotic calcification of the internal carotid arteries at the skull base without hemodynamically significant stenosis. --Anterior cerebral arteries (ACA): Normal. Absent right A1 segment, normal variant --Middle cerebral arteries (MCA): The M1 segments are normal. There is multifocal severe stenosis of the M2 segments bilaterally. VENOUS SINUSES: As permitted by contrast timing, patent. ANATOMIC VARIANTS: None Review of the MIP images confirms the above findings. IMPRESSION: 1. 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 2. Bilateral multifocal severe M2 segment stenoses. 3. 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 Histologic sampling should be considered due to the degree of overlap of imaging features between benign and malignant parotid tumors. 4. Aortic Atherosclerosis (ICD10-I70.0). Electronically Signed   By: Ulyses Jarred M.D.   On: 08/27/2019 03:34   MR BRAIN WO CONTRAST  Result Date: 08/27/2019 CLINICAL DATA:  Encephalopathy EXAM: MRI HEAD WITHOUT CONTRAST TECHNIQUE: Multiplanar, multiecho pulse sequences of the brain and surrounding structures were obtained without intravenous contrast. COMPARISON:  Brain MRI 02/03/2013 and head CT 08/26/2019 FINDINGS: Brain: There is a large right PCA territory infarct predominantly affecting the medial temporal lobe and occipital lobe with smaller foci of ischemia in the right thalamus and right middle cerebellar peduncle. There is a  large area of encephalomalacia within the left anterior cerebral artery territory. There is diffuse confluent white matter hyperintensity compatible with ischemic microangiopathy. There are old bilateral cerebellar infarcts. Vascular: There is loss of the normal right PCA flow void. There are 10-20 scattered foci of chronic microhemorrhage in a predominantly peripheral distribution. No acute hemorrhage. Skull and upper cervical spine: The bone marrow signal of the cranium and upper cervical vertebrae is normal. There is no skull base lesion. The visualized upper cervical spinal cord is normal. Sinuses/Orbits: There is no paranasal sinus fluid level or advanced mucosal thickening. There is no mastoid or middle ear effusion. The orbits are normal. Other: None IMPRESSION: 1. Large, acute right PCA territory infarct involving the medial temporal lobe and occipital lobe with additional smaller foci of acute ischemia within the right thalamus and right middle cerebellar peduncle. 2. Loss of the normal right PCA flow void consistent with occlusion. 3. Multiple foci of chronic microhemorrhage in a predominantly peripheral distribution. 4. No acute hemorrhage or mass effect. Electronically Signed   By: Ulyses Jarred M.D.   On: 08/27/2019 02:13   XR Chest Single View  Result Date: 08/26/2019 CLINICAL DATA:  Altered behavior, confusion and falls today EXAM: PORTABLE CHEST 1 VIEW COMPARISON:  Radiograph Feb 03, 2013 FINDINGS: Nonspecific elevation of the right hemidiaphragm with adjacent opacity likely reflecting atelectasis. No consolidation, convincing features of  edema, pneumothorax or effusion. The aorta is calcified. The remaining cardiomediastinal contours are unremarkable. Degenerative changes are present in the imaged spine and shoulders. No acute osseous or soft tissue abnormality. IMPRESSION: 1. Nonspecific elevation of the right hemidiaphragm with adjacent opacity likely reflecting atelectasis. 2.  No other  acute cardiopulmonary abnormality. Electronically Signed   By: Lovena Le M.D.   On: 08/26/2019 20:30   ECHOCARDIOGRAM COMPLETE  Result Date: 08/27/2019   ECHOCARDIOGRAM REPORT   Patient Name:   DOMANIK RAINVILLE Date of Exam: 08/27/2019 Medical Rec #:  [id number]     Height:       69.0 in Accession #:    [id number]    Weight:       188.0 lb Date of Birth:  [birth year]     BSA:          2.01 m Patient Age:    76 years      BP:           132/101 mmHg Patient Gender: M             HR:           61 bpm. Exam Location:  Inpatient Procedure: 2D Echo Indications:    Stroke 434.91/I163.9  History:        Patient has prior history of Echocardiogram examinations, most                 recent 02/09/2013. Stroke; Risk Factors:Hypertension.  Sonographer:    Clayton Lefort RDCS (AE) Referring Phys: [id number] Jani Gravel  Sonographer Comments: Patient unable to hold breath when requested. IMPRESSIONS  1. Left ventricular ejection fraction, by visual estimation, is 65 to 70%. The left ventricle has normal function. There is severely increased left ventricular hypertrophy.  2. Left ventricular diastolic parameters are consistent with Grade I diastolic dysfunction (impaired relaxation).  3. The left ventricle has no regional wall motion abnormalities.  4. Global right ventricle has normal systolic function.The right ventricular size is normal. No increase in right ventricular wall thickness.  5. Left atrial size was normal.  6. Right atrial size was normal.  7. The mitral valve is normal in structure. Trivial mitral valve regurgitation.  8. The tricuspid valve is normal in structure. Tricuspid valve regurgitation is not demonstrated.  9. The aortic valve is normal in structure. Aortic valve regurgitation is not visualized. Mild aortic valve sclerosis without stenosis. 10. The pulmonic valve was grossly normal. Pulmonic valve regurgitation is not visualized. 11. The atrial septum is grossly normal. FINDINGS  Left Ventricle: Left ventricular  ejection fraction, by visual estimation, is 65 to 70%. The left ventricle has normal function. The left ventricle has no regional wall motion abnormalities. There is severely increased left ventricular hypertrophy. Asymmetric left ventricular hypertrophy. Left ventricular diastolic parameters are consistent with Grade I diastolic dysfunction (impaired relaxation). Right Ventricle: The right ventricular size is normal. No increase in right ventricular wall thickness. Global RV systolic function is has normal systolic function. Left Atrium: Left atrial size was normal in size. Right Atrium: Right atrial size was normal in size Pericardium: There is no evidence of pericardial effusion. Mitral Valve: The mitral valve is normal in structure. Trivial mitral valve regurgitation. Tricuspid Valve: The tricuspid valve is normal in structure. Tricuspid valve regurgitation is not demonstrated. Aortic Valve: The aortic valve is normal in structure. Aortic valve regurgitation is not visualized. Mild aortic valve sclerosis is present, with no evidence of aortic valve stenosis. Aortic valve mean gradient measures 4.0  mmHg. Aortic valve peak gradient measures 7.2 mmHg. Aortic valve area, by VTI measures 2.46 cm. Pulmonic Valve: The pulmonic valve was grossly normal. Pulmonic valve regurgitation is not visualized. Pulmonic regurgitation is not visualized. Aorta: The aortic root and ascending aorta are structurally normal, with no evidence of dilitation. IAS/Shunts: The atrial septum is grossly normal.  LEFT VENTRICLE PLAX 2D LVIDd:         3.90 cm  Diastology LVIDs:         2.20 cm  LV e' lateral:   6.53 cm/s LV PW:         1.40 cm  LV E/e' lateral: 9.3 LV IVS:        1.90 cm  LV e' medial:    5.87 cm/s LVOT diam:     2.00 cm  LV E/e' medial:  10.4 LV SV:         50 ml LV SV Index:   24.19 LVOT Area:     3.14 cm  RIGHT VENTRICLE RV S prime:     11.00 cm/s TAPSE (M-mode): 1.7 cm LEFT ATRIUM           Index       RIGHT ATRIUM            Index LA diam:      2.70 cm 1.34 cm/m  RA Area:     15.00 cm LA Vol (A2C): 65.5 ml 32.55 ml/m RA Volume:   36.00 ml  17.89 ml/m LA Vol (A4C): 32.6 ml 16.20 ml/m  AORTIC VALVE AV Area (Vmax):    2.67 cm AV Area (Vmean):   2.63 cm AV Area (VTI):     2.46 cm AV Vmax:           134.00 cm/s AV Vmean:          91.000 cm/s AV VTI:            0.290 m AV Peak Grad:      7.2 mmHg AV Mean Grad:      4.0 mmHg LVOT Vmax:         114.00 cm/s LVOT Vmean:        76.100 cm/s LVOT VTI:          0.227 m LVOT/AV VTI ratio: 0.78  AORTA Ao Root diam: 3.40 cm Ao Asc diam:  2.90 cm MITRAL VALVE MV Area (PHT): 2.66 cm             SHUNTS MV PHT:        82.65 msec           Systemic VTI:  0.23 m MV Decel Time: 285 msec             Systemic Diam: 2.00 cm MV E velocity: 61.00 cm/s 103 cm/s MV A velocity: 84.40 cm/s 70.3 cm/s MV E/A ratio:  0.72       1.5  Mertie Moores MD Electronically signed by Mertie Moores MD Signature Date/Time: 08/27/2019/12:29:31 PM    Final      Time Spent in minutes  30     Desiree Hane M.D on 08/30/2019 at 3:35 PM  To page go to [email or web address] - password [facility]

## 2019-08-30 NOTE — Progress Notes (Signed)
Inpatient Rehabilitation-Admissions Coordinator   Met with pt bedside as follow up from PM&R consult. During assessment pt did state he would be interested in a rehab program to help him get stronger and go home. With his permission I will speak with his wife to gather more information to determine if he has the recommended assist at DC to support an IP rehab stay.   I spoke with his wife Marcie Bal via phone. She confirmed DC support (24/7 Supervision as she works from home) and would also like for [place] to pursue this program for her husband. I will begin insurance authorization process for possible admit.   Jhonnie Garner, OTR/L  Rehab Admissions Coordinator  [phone] 08/30/2019 3:48 PM

## 2019-08-30 NOTE — Progress Notes (Signed)
  Speech Language Pathology Treatment: Dysphagia;Cognitive-Linquistic  Patient Details Name: [name] MRN: [id number] DOB: 02-Jan-1956 Today's Date: 08/30/2019 Time: [id number] SLP Time Calculation (min) (ACUTE ONLY): 19 min  Assessment / Plan / Recommendation Clinical Impression  Pt in bed with wife and sitter present with skilled therapy targeted at safety with po's, ability to upgrade and cognitive status (did not focus on dysarthria this session). He requires moderate verbal prompts to attend to left and has difficulty locating objects placed on left side. SLP allowed additional time for him to process information requested of him. When asked what he was thinking about, he responded "thinking about when these questions are going to stop" with a slight smile. To improve intellectual awareness he stated that he has a more difficult time doing things now and "it's not as easy" but could not be specific.  Thin water via cup and solid texture were consumed with one subtle throat clear after thin. Oral clearance with cracker and sitter reported pt was unable to effectively masticate eggs on breakfast tray. Recommend he continue Dys 2 texture for now, thin liquids moving toward upgrade.     HPI HPI: Pt is a 63 yo male admitted with AMS and falls. 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 PMH includes: CVA (L frontal in 2014, treated for aphasia, mild dysarthria, and cognitive impairment at that time), HTN      SLP Plan  Continue with current plan of care       Recommendations  Diet recommendations: Dysphagia 2 (fine chop);Thin liquid Liquids provided via: Cup;Straw Medication Administration: Whole meds with puree Supervision: Patient able to self feed;Staff to assist with self feeding;Full supervision/cueing for compensatory strategies Compensations: Slow rate;Small sips/bites Postural Changes and/or Swallow Maneuvers: Seated upright 90 degrees                Oral Care  Recommendations: Oral care BID Follow up Recommendations: Inpatient Rehab SLP Visit Diagnosis: Dysphagia, unspecified (R13.10);Cognitive communication deficit [id number]) Plan: Continue with current plan of care                      Houston Siren 08/30/2019, 12:09 PM  Orbie Pyo Colvin Caroli.Ed Risk analyst [phone] Office [phone]

## 2019-08-30 NOTE — Consult Note (Signed)
Physical Medicine and Rehabilitation Consult Reason for Consult: Decreased functional mobility Referring Physician: [facility]   HPI: [name] is a 63 y.o. right-handed male with history of hypertension, alcohol use, left frontal CVA with right side weakness 02/03/2013 and received inpatient rehab services 02/09/2013 to 02/25/2013.  Reportedly patient on aspirin prior to admission for history of CVA but not been taking medications.  Per chart review patient lives with spouse.  1 level home.  Independent prior to admission.  Wife is currently working from home.  Presented 08/27/2019 with altered mental status slurred speech and facial droop.  Cranial CT scan showed lucency identified in the right cerebellum likely due to old infarct age-indeterminate.  Old infarcts identified in the left frontal lobe, left cerebellum and bilateral basal ganglia.  MRI of the brain showed large acute right PCA territory infarct involving the medial temporal lobe and occipital lobe with additional small foci of acute ischemia within the right thalamus and right middle cerebellar peduncle.  Patient did not receive TPA.  Admission chemistries with creatinine 1.32, lactic acid 2.0, alcohol level negative, SARS coronavirus negative, urine drug screen positive cocaine.  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  Echocardiogram with ejection fraction of 70%.  Neurology follow-up currently on aspirin 325 mg daily and Plavix for 3 months then Plavix alone.  Recommendations for 30-day cardiac event monitor.  Dysphagia #2 thin liquid diet.  Therapy evaluations completed with recommendations of physical medicine rehab consult.  Patient has a sitter due to poor safety awareness.  Patient does not answer all questions.  When asked whether he had a wife he did not answer and also said that he would not have anybody to help him at home.  Wife did leave a message with her  phone number.   Review of Systems  Constitutional: Negative for chills and fever.  HENT: Negative for hearing loss.   Eyes: Negative for blurred vision and double vision.  Respiratory: Negative for cough and shortness of breath.   Cardiovascular:       Anginal pain  Gastrointestinal: Positive for constipation. Negative for heartburn, nausea and vomiting.  Genitourinary: Negative for dysuria and hematuria.  Musculoskeletal: Positive for joint pain and myalgias.  Skin: Negative for rash.  Neurological: Positive for speech change and focal weakness.  All other systems reviewed and are negative.  Past Medical History:  Diagnosis Date  . Anginal pain ([place])   . Heart murmur   . Hypertension   . Psoriasis    "back and stomach" (02/03/2013)  . Stroke ([place]) 02/01/2013   left frontal CVA and symptoms of right arm and leg weakness and expressive aphasia/notes 02/03/2013 (02/03/2013)   Past Surgical History:  Procedure Laterality Date  . NO PAST SURGERIES    . TEE WITHOUT CARDIOVERSION N/A 02/09/2013   Procedure: TRANSESOPHAGEAL ECHOCARDIOGRAM (TEE);  Surgeon: Lelon Perla, MD;  [facility] ENDOSCOPY;  Service: Cardiovascular;  Laterality: N/A;   Family History  Problem Relation Age of Onset  . Diabetes Mother    Social History:  reports that he quit smoking about 6 years ago. His smoking use included cigarettes. He has a 9.25 pack-year smoking history. He has never used smokeless tobacco. He reports current alcohol use of about 17.0 standard drinks of alcohol per week. He reports that he does not use drugs. Allergies:  Allergies  Allergen Reactions  . Shellfish Allergy Itching   Medications Prior to  Admission  Medication Sig Dispense Refill  . aspirin 325 MG tablet Take 1 tablet (325 mg total) by mouth daily. (Patient not taking: Reported on 08/27/2019)    . atorvastatin (LIPITOR) 10 MG tablet Take 1 tablet (10 mg total) by mouth daily at 6 PM. (Patient not taking: Reported on  08/27/2019) 30 tablet 1  . diclofenac sodium (VOLTAREN) 1 % GEL Apply 2 g topically 4 (four) times daily. (Patient not taking: Reported on 08/27/2019) 3 Tube 1  . lisinopril (PRINIVIL,ZESTRIL) 10 MG tablet Take 1 tablet (10 mg total) by mouth daily. (Patient not taking: Reported on 08/27/2019) 30 tablet 1    Home[place] expects to be discharged to:: Private residence Living Arrangements: Spouse/significant other Available Help at Discharge: Family Type of Home: House Home Access: Level entry Home Layout: One level Additional Comments: wife working from home right now, pt reports working at [employer] (questionable historian)  Lives With: Spouse  Functional History: Prior Function Level of Independence: Independent Functional Status:  Mobility: Bed Mobility Overal bed mobility: Needs Assistance Bed Mobility: Rolling Rolling: Max assist, +2 for physical assistance, +2 for safety/equipment Supine to sit: Max assist, HOB elevated Sit to supine: Mod assist General bed mobility comments: requires help to scoot up bed max 2, pt is exerting very limited effort and declined to sit up on side of bed.  Much more sleepy today Transfers Overall transfer level: Needs assistance Equipment used: Rolling walker (2 wheeled), 1 person hand held assist Transfers: Sit to/from Stand Sit to Stand: Mod assist, Max assist General transfer comment: declined Ambulation/Gait Ambulation/Gait assistance: Mod assist, Max assist Gait Distance (Feet): 20 Feet Assistive device: 1 person hand held assist Gait Pattern/deviations: Step-to pattern, Shuffle, Wide base of support, Decreased stride length, Staggering left General Gait Details: shuffled with wide BOS L leg dragging behind to doorway where he ran straight into door which was half open and at his midline; max A to turn with max cues and pt with LOB to L, cue to look forward at bed and stand straight, knocked over trash can with L UE  flailing; returned to bed and sat with uncontrolled descent    ADL:    Cognition: Cognition Overall Cognitive Status: Impaired/Different from baseline Arousal/Alertness: Awake/alert Orientation Level: Oriented to person, Oriented to place, Oriented to time Attention: Sustained Sustained Attention: Impaired Sustained Attention Impairment: Functional basic Memory: Impaired Memory Impairment: Decreased recall of new information Awareness: Impaired Awareness Impairment: Intellectual impairment, Emergent impairment, Anticipatory impairment Problem Solving: Impaired Problem Solving Impairment: Functional basic Behaviors: Impulsive Safety/Judgment: Impaired Cognition Arousal/Alertness: Lethargic Behavior During Therapy: Flat affect Overall Cognitive Status: Impaired/Different from baseline Area of Impairment: Problem solving, Awareness, Safety/judgement, Following commands, Attention, Orientation Orientation Level: Time, Situation Current Attention Level: Selective Following Commands: Follows one step commands inconsistently, Follows one step commands with increased time Safety/Judgement: Decreased awareness of safety, Decreased awareness of deficits Awareness: Intellectual Problem Solving: Decreased initiation, Requires verbal cues, Requires tactile cues General Comments: reports wife sent him here due to him acting out; difficulty following commands needing visual cue or else assist  Blood pressure (!) 144/86, pulse 72, temperature 98.3 F (36.8 C), temperature source Oral, resp. rate 16, SpO2 99 %. Physical Exam  Vitals reviewed. Constitutional: He is oriented to person, place, and time. He appears well-developed and well-nourished.  HENT:  Head: Normocephalic and atraumatic.  Eyes: Pupils are equal, round, and reactive to light. Conjunctivae and EOM are normal.  Cardiovascular: Normal rate, regular rhythm and normal heart sounds.  No murmur heard. Respiratory: Effort normal  and breath sounds normal. No respiratory distress. He has no wheezes.  GI: Soft. Bowel sounds are normal. He exhibits no distension. There is no abdominal tenderness.  Neurological: He is alert and oriented to person, place, and time.  Patient is alert with dysarthric speech.  Exhibits left neglect.  Provides his name and age.  Limited overall medical historian. Has absent sensation in the left upper limb there is intact light touch sensation in the left lower limb right upper and right lower limb are intact Motor strength is 4+ in the right deltoid by stress of grip hip flexion extensor ankle dorsiflexion 3 - in the left upper extremity with poor motor control in the left upper limb, 4 - in left hip flexor knee extensor Patient has left homonymous hemianopsia as well as left neglect needs cueing toward the left  Skin: Skin is warm and dry.  Psychiatric: His affect is blunt. His speech is delayed. He is slowed. Cognition and memory are impaired. He is inattentive.    No results found for this or any previous visit (from the past 24 hour(s)). No results found.   Assessment/Plan: Diagnosis: Right PCA infarct 1. Does the need for close, 24 hr/day medical supervision in concert with the patient's rehab needs make it unreasonable for this patient to be served in a less intensive setting? Yes 2. Co-Morbidities requiring supervision/potential complications: History of substance abuse, hypertension, poor awareness of left side with decreased safety awareness, reduced sensation left upper limb with reduced motor control 3. Due to bladder management, bowel management, safety, skin/wound care, disease management, medication administration, pain management and patient education, does the patient require 24 hr/day rehab nursing? Yes 4. Does the patient require coordinated care of a physician, rehab nurse, therapy disciplines of PT, OT, speech to address physical and functional deficits in the context of the  above medical diagnosis(es)? Yes Addressing deficits in the following areas: balance, endurance, locomotion, strength, transferring, bowel/bladder control, bathing, dressing, feeding, grooming, toileting, cognition, speech and psychosocial support 5. Can the patient actively participate in an intensive therapy program of at least 3 hrs of therapy per day at least 5 days per week? Yes 6. The potential for patient to make measurable gains while on inpatient rehab is good 7. Anticipated functional outcomes upon discharge from inpatient rehab are supervision  with PT, supervision with OT, supervision with SLP. 8. Estimated rehab length of stay to reach the above functional goals is: 14 to 18 days 9. Anticipated discharge destination: Home versus SNF depending whether he has 24/7 supervision 10. Overall Rehab/Functional Prognosis: fair  RECOMMENDATIONS: This patient's condition is appropriate for continued rehabilitative care in the following setting: CIR Patient has agreed to participate in recommended program. Potentially Note that insurance prior authorization may be required for reimbursement for recommended care.  Comment: We will need to clarify disposition.  Patient will need 24/7 supervision post CIR admission, patient has cognitive deficits and states that no one is able to help him post discharge. Lavon Paganini Angiulli, PA-C 08/30/2019

## 2019-08-31 DIAGNOSIS — R410 Disorientation, unspecified: Secondary | ICD-10-CM

## 2019-08-31 DIAGNOSIS — E876 Hypokalemia: Secondary | ICD-10-CM

## 2019-08-31 LAB — MAGNESIUM: Magnesium: 1.9 mg/dL (ref 1.7–2.4)

## 2019-08-31 MED ORDER — POTASSIUM CHLORIDE CRYS ER 20 MEQ PO TBCR
40.0000 meq | EXTENDED_RELEASE_TABLET | ORAL | Status: AC
  Administered 2019-08-31 (×2): 40 meq via ORAL
  Filled 2019-08-31 (×2): qty 2

## 2019-08-31 NOTE — Progress Notes (Signed)
Physical Therapy Treatment Patient Details Name: [name] MRN: [id number] DOB: 05-25-1956 Today's Date: 08/31/2019    History of Present Illness [name] is a 63 y.o. year old male with medical history significant for hypertension and previous stroke with some residual deficit who presented on 08/26/2019 with worsening confusion and several falls at home x1 day and was found to have hypertensive emergency with large occipital stroke.    PT Comments    Patient received in bed, sleeping and seems much more lethargic than on previous session, nursing staff reports he has been like this most of the day as well. Required MaxAx2, extended time, and a large amount of coaxing for bed mobility, initially needed ModA to maintain midline sitting due to LOB all directions but faded to Min guard-MinA. Performed partial stand with totalAx2, not able to straighten all the way up and patient with minimal effort, letting knees buckle- unable to get to full standing position today. He was returned to supine and positioned to comfort, left in bed with all needs met and safety sitter present.    Follow Up Recommendations  CIR     Equipment Recommendations  Other (comment)    Recommendations for Other Services       Precautions / Restrictions Precautions Precautions: Fall Precaution Comments: fell in ED Restrictions Weight Bearing Restrictions: No    Mobility  Bed Mobility Overal bed mobility: Needs Assistance Bed Mobility: Supine to Sit;Sit to Supine     Supine to sit: +2 for physical assistance;Max assist Sit to supine: +2 for physical assistance;Max assist   General bed mobility comments: MaxAx2 for all bed mobility, gave extended time due to reduced initation but continued to require increased levels of assist, ModA initially to maintain balance at EOB faded to close min guard-MinA  Transfers Overall transfer level: Needs assistance Equipment used: 2 person hand held  assist Transfers: Sit to/from Stand Sit to Stand: Total assist         General transfer comment: totalAx2 for partial stand, patient with very little effort and let knees bend in standing  Ambulation/Gait             General Gait Details: deferred- fatigue   Stairs             Wheelchair Mobility    Modified Rankin (Stroke Patients Only) Modified Rankin (Stroke Patients Only) Pre-Morbid Rankin Score: Slight disability Modified Rankin: Moderately severe disability     Balance Overall balance assessment: Needs assistance Sitting-balance support: Feet supported Sitting balance-Leahy Scale: Poor Sitting balance - Comments: Min-ModA to maintain upright sitting with LOB all directions Postural control: Right lateral lean;Left lateral lean;Posterior lean   Standing balance-Leahy Scale: Zero                              Cognition Arousal/Alertness: Lethargic Behavior During Therapy: Flat affect Overall Cognitive Status: Impaired/Different from baseline Area of Impairment: Orientation;Attention;Memory;Following commands;Safety/judgement;Awareness;Problem solving                 Orientation Level: Disoriented to;Situation;Time Current Attention Level: Focused Memory: Decreased recall of precautions;Decreased short-term memory Following Commands: Follows one step commands inconsistently;Follows one step commands with increased time Safety/Judgement: Decreased awareness of safety;Decreased awareness of deficits Awareness: Intellectual Problem Solving: Decreased initiation;Requires verbal cues;Requires tactile cues General Comments: much more lethargic today, unsure if he slept well or not due to increased fatigue today      Exercises  General Comments General comments (skin integrity, edema, etc.): more lethargic today, required a lot of coaxing for participation      Pertinent Vitals/Pain Pain Assessment: Faces Pain Score: 0-No  pain Faces Pain Scale: No hurt Pain Intervention(s): Limited activity within patient's tolerance;Monitored during session    Home Living                      Prior Function            PT Goals (current goals can now be found in the care plan section) Acute Rehab PT Goals Patient Stated Goal: none PT Goal Formulation: Patient unable to participate in goal setting Time For Goal Achievement: 09/10/19 Potential to Achieve Goals: Good Progress towards PT goals: Not progressing toward goals - comment(limited by lethargy and fatigue)    Frequency    Min 4X/week      PT Plan Current plan remains appropriate    Co-evaluation              AM-PAC PT "6 Clicks" Mobility   Outcome Measure  Help needed turning from your back to your side while in a flat bed without using bedrails?: A Lot Help needed moving from lying on your back to sitting on the side of a flat bed without using bedrails?: Total Help needed moving to and from a bed to a chair (including a wheelchair)?: Total Help needed standing up from a chair using your arms (e.g., wheelchair or bedside chair)?: Total Help needed to walk in hospital room?: Total Help needed climbing 3-5 steps with a railing? : Total 6 Click Score: 7    End of Session Equipment Utilized During Treatment: Gait belt Activity Tolerance: Patient limited by fatigue;Patient limited by lethargy Patient left: in bed;with call bell/phone within reach;with bed alarm set;with nursing/sitter in room;with restraints reapplied   PT Visit Diagnosis: Other abnormalities of gait and mobility (R26.89);Other symptoms and signs involving the nervous system (R29.898);History of falling (Z91.81);Repeated falls (R29.6)     Time: 7482-7078 PT Time Calculation (min) (ACUTE ONLY): 18 min  Charges:  $Therapeutic Activity: 8-22 mins                     Windell Norfolk, DPT, PN1   Supplemental Physical Therapist [place]    Pager  [phone] Acute Rehab Office [phone]

## 2019-08-31 NOTE — TOC Initial Note (Signed)
Transition of Care [facility]) - Initial/Assessment Note    Patient Details  Name: [name] MRN: [id number] Date of Birth: Jan 09, 1956  [facility] of [facility] CM/SW Contact:    Sharin Mons, RN Phone Number: 08/31/2019, 4:05 PM  Clinical Narrative:     Admitted with large occipital stroke.     Hx of HTN, previous CVA. From home with wife. Wife states PTA independent with ADL's, no DME needs.     [name] (Spouse)     [phone]      Per PT recommendations pt will benefit from CIR @ d/c. Inpatient rehab admission coordinator following for rehab. Admit.... insurance authorization  In process.  TOC team will continue to monitor and follow for needs. Expected Discharge Plan: IP Rehab Facility Barriers to Discharge: Continued Medical Work up   Patient Goals and CMS Choice        Expected Discharge Plan and Services Expected Discharge Plan: [place]   Discharge Planning Services: CM Consult   Living arrangements for the past 2 months: Single Family Home                                      Prior Living Arrangements/Services Living arrangements for the past 2 months: Single Family Home Lives with:: Spouse   Do you feel safe going back to the place where you live?: Yes               Activities of Daily Living      Permission Sought/Granted   Permission granted to share information with : Yes, Verbal Permission Granted              Emotional Assessment       Orientation: : Fluctuating Orientation (Suspected and/or reported Sundowners) Alcohol / Substance Use: Not Applicable Psych Involvement: No (comment)  Admission diagnosis:  Hypertensive crisis [I16.9] Stroke [facility]) [I63.9] Acute ischemic right PCA stroke ([place]) [I63.531] AKI (acute kidney injury) ([place]) [N17.9] Altered mental status, unspecified altered mental status type [R41.82] Altered behavior [R46.89] Patient Active Problem List   Diagnosis Date Noted  .  Cocaine abuse ([place]) 08/29/2019  . AKI (acute kidney injury) ([place]) 08/29/2019  . Hyperlipidemia 08/29/2019  . Confusion 08/29/2019  . Acute ischemic right PCA stroke ([place]) 08/27/2019  . Aphasia as late effect of cerebrovascular accident 05/14/2013  . 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 Spastic hemiplegia affecting dominant side ([place]) 03/12/2013  . 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 CVA (cerebral infarction) 02/03/2013  . HTN (hypertension) 02/03/2013  . Tobacco abuse 02/03/2013   PCP:  Alroy Dust, L.[name], MD Pharmacy:   [id number] - [address] [place] [address] Phone: [phone] Fax: [phone]  [id number] - [place], [address] [place] [address] Phone: [phone] Fax: [phone]     Social Determinants of Health (SDOH) Interventions    Readmission Risk Interventions No flowsheet data found.

## 2019-08-31 NOTE — Progress Notes (Signed)
[facility]  PROGRESS NOTE  [name] WNU:[id number] DOB: 04-01-1956 DOA: 08/26/2019 PCP: Alroy Dust, L.[name], MD Admit date - 08/26/2019   Admitting Physician Jani Gravel, MD  Outpatient Primary MD for the patient is [place], L.[name], MD  LOS - 4 Brief Narrative   [name] is a 63 y.o. year old male with medical history significant for hypertension and previous stroke with some residual deficit who presented on 08/26/2019 with worsening confusion and several falls at home x1 day and was found to have hypertensive emergency with large occipital stroke. In ED initially noted to be hypertensive with blood pressure 230/125 and creatinine of 1.3.  Upon evaluation in the ED patient was intermittently following commands with noticeable facial droop and slurred speech per family consistent with his baseline of previous stroke.  CT head initially revealed age-indeterminate infarct.  Given persistently high blood pressure and intermittent confusion neurology was consulted who recommended permissive hypertension and ordered MRI showed a large occipital lobe stroke.   Hospital course complicated by continued confusion.  CT head done and showed no acute change from prior stroke.  Since then patient has needed mitts to prevent taking out IV lines but has remained calm, not agitated, has not had any recurrent falls with sitter at bedside.  Currently awaiting insurance authorization for CIR  Subjective  [name] today has no acute complaints and no acute events overnight. Still able to tell me he is at [place] and the year and who he is. , No headache, No chest pain, No abdominal pain - No Nausea,  A & P   Large PCA territory infarct on the right, stable.  History of previous stroke.  Found to be cocaine positive.  Likely in the setting of hypertensive emergency and substance abuse, likely thrombotic could also be embolic.  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  Preserved  EF on TTE without PFO.  A1c 6.2, LDL 107, UDS positive for cocaine -Neurology f/u with [facility] in 4 weeks,  --Cardiology will arrange 30 day cardiac event monitoring for Afib rule out -Frequent neuro checks -Aspirin 325 mg p.o.,  plavix 75 mg qd for DAPT x 3 months,then plavix alone per neurology -Monitor on telemetry,  --PT recs CIR, inpt rehab consulted and agrees-insurance auth currently pending  Confusion, resolved. Will likely be prone to bouts of confusion given large PCA stroke  Currently oriented to self, place and following commands. -Repeat CT head shows no evolution or worsening of known disease after fall in ED on admission -Avoid further sedatives -no longer requiring restraints, high fall risk, continue sitter  Hypertension, SBP 140s-160s.  Allow permissive hypertension, okay if less than 220/120 over the next 5 to 7 days post acute CVA -Lisinopril, HCTZ, labetalol as needed  Hypokalemia, persists.  Patient is on HCTZ.  Magnesium within normal limits. -Supplement orally -Repeat BMP in a.m.  Substance abuse. 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  Incidentally found on imaging for stroke work-up -Histologic sampling should be considered due to degree of overlap of imaging features between benign and malignant parotid tumors by ENT  Elevated alk phos.  No elevation in LFTs or bilirubin.  GGT unremarkable.  No abdominal exam pain -Closely monitor  Renal insufficiency, acute increase. Likely AKI related to decreased PO in setting of stroke -Unclear if acute -  Last creatinine from 2014 was 1.14 -1.32 on admission, up to 1.52--given IVF, repeat BMP this afternoon --Encouraged PO intake -Avoid nephrotoxins,  Family Communication  : spoke with wife on phone on 12/14--she is aware of  insurance awaiting authorization of CIR, left voice message on 12/15  Code Status : Full code  Disposition Plan  : Medically stable, currently awaiting insurance authorization for CIR  Consults  : Neurology  Procedures  : TTE  DVT Prophylaxis  : Aspirin, Plavix, SCDs  Lab Results  Component Value Date   PLT 281 08/27/2019    Diet :  Diet Order            DIET DYS 2 Room service appropriate? Yes; Fluid consistency: Thin  Diet effective now               Inpatient Medications Scheduled Meds: .  stroke: mapping our early stages of recovery book   Does not apply Once  . aspirin  300 mg Rectal Daily   Or  . aspirin  325 mg Oral Daily  . atorvastatin  80 mg Oral q1800  . clopidogrel  75 mg Oral Daily  . hydrochlorothiazide  25 mg Oral Daily  . lisinopril  10 mg Oral Daily   Continuous Infusions:  PRN Meds:.acetaminophen **OR** acetaminophen (TYLENOL) oral liquid 160 mg/5 mL **OR** acetaminophen, labetalol  Antibiotics  :   Anti-infectives (From admission, onward)   None       Objective   Vitals:   08/31/19 0745 08/31/19 0912 08/31/19 0913 08/31/19 1607  BP:  (!) 173/126 (!) 165/118 (!) 150/105  Pulse:  (!) 110 (!) 108 89  Resp: 11 19    Temp:  98 F (36.7 C)  98 F (36.7 C)  TempSrc:  Oral  Axillary  SpO2:  99% 97%     SpO2: 97 %  Wt Readings from Last 3 Encounters:  06/10/13 85.3 kg  05/14/13 80.8 kg  03/12/13 83.9 kg     Intake/Output Summary (Last 24 hours) at 08/31/2019 1648 Last data filed at 08/31/2019 1615 Gross per 24 hour  Intake 120 ml  Output 600 ml  Net -480 ml    Physical Exam:  Awake Alert, oriented to place [facility] hospital), time (year), self Decreased attention Left face droop, slurred speech Moves all extremities against gravity on command [place].AT, Symmetrical Chest wall movement, Good air movement bilaterally on room air, CTAB +ve B.Sounds, Abd Soft, No tenderness,  No Cyanosis, Clubbing or edema, No new Rash or bruise       I have personally reviewed the following:   Data Reviewed:  CBC Recent Labs  Lab 08/26/19 2047 08/27/19 0846  WBC 8.5 8.3  HGB 17.0 15.9  HCT 52.3* 49.5  PLT 265 281  MCV 84.5 83.8  MCH 27.5 26.9  MCHC 32.5 32.1  RDW 14.5 14.3    Chemistries  Recent Labs  Lab 08/26/19 2047 08/27/19 0846 08/28/19 0237 08/29/19 0349 08/30/19 1603 08/31/19 0909  NA 140 141 136 139 139  --   K 3.5 3.5 3.2* 3.0* 3.0*  --   CL 106 105 99 101 103  --   CO2 '23 25 22 26 25  ' --   GLUCOSE 100* 100* 106* 124* 119*  --   BUN 9 7* 6* 11 13  --   CREATININE 1.32* 1.30* 1.33* 1.53* 1.29*  --   CALCIUM 9.2 9.1 9.2 9.1 9.2  --   MG  --   --   --  2.0  --  1.9  AST 16 20  --   --   --   --   ALT 25 24  --   --   --   --   ALKPHOS 140* 140*  --   --   --   --   BILITOT 1.1 1.9*  --   --   --   --    ------------------------------------------------------------------------------------------------------------------ No results for input(s): CHOL, HDL, LDLCALC, TRIG, CHOLHDL, LDLDIRECT in the last 72 hours.  Lab Results  Component Value Date   HGBA1C 6.2 (H) 08/27/2019   ------------------------------------------------------------------------------------------------------------------ No results for input(s): TSH, T4TOTAL, T3FREE, THYROIDAB in the last 72 hours.  Invalid input(s): FREET3 ------------------------------------------------------------------------------------------------------------------ No results for input(s): VITAMINB12, FOLATE, FERRITIN, TIBC, IRON, RETICCTPCT in the last 72 hours.  Coagulation profile No results for input(s): INR, PROTIME in the last 168 hours.  No results for input(s): DDIMER in the last 72 hours.  Cardiac Enzymes No results for input(s): CKMB, TROPONINI, MYOGLOBIN in the last 168 hours.  Invalid input(s): CK ------------------------------------------------------------------------------------------------------------------ No results found for:  BNP  Micro Results Recent Results (from the past 240 hour(s))  SARS CORONAVIRUS 2 (TAT 6-24 HRS) Nasopharyngeal Nasopharyngeal Swab     Status: None   Collection Time: 08/26/19  9:54 PM   Specimen: Nasopharyngeal Swab  Result Value Ref Range Status   SARS Coronavirus 2 NEGATIVE NEGATIVE Final    Comment: (NOTE) SARS-CoV-2 target nucleic acids are NOT DETECTED. The SARS-CoV-2 RNA is generally detectable in upper and lower respiratory specimens during the acute phase of infection. Negative results do not preclude SARS-CoV-2 infection, do not rule out co-infections with other pathogens, and should not be used as the sole basis for treatment or other patient management decisions. Negative results must be combined with clinical observations, patient history, and epidemiological information. The expected result is Negative. Fact Sheet for Patients: SugarRoll.be Fact Sheet for Healthcare Providers: https://www.woods-mathews.com/ This test is not yet approved or cleared by the [place] FDA and  has been authorized for detection and/or diagnosis of SARS-CoV-2 by FDA under an Emergency Use Authorization (EUA). This EUA will remain  in effect (meaning this test can be used) for the duration of the COVID-19 declaration under Section 56 4(b)(1) of the Act, 21 U.S.C. section 360bbb-3(b)(1), unless the authorization is terminated or revoked sooner. Performed at [facility], [place] [address], [place], [place] [address]     Radiology Reports CT ANGIO HEAD W OR WO CONTRAST  Result Date: 08/27/2019 CLINICAL DATA:  Stroke follow-up. Known right PCA stroke. EXAM: CT ANGIOGRAPHY HEAD AND NECK TECHNIQUE: Multidetector CT imaging of the head and neck was performed using the standard protocol during bolus administration of intravenous contrast. Multiplanar CT image reconstructions and MIPs were obtained to evaluate the vascular anatomy. Carotid stenosis  measurements (when applicable) are obtained utilizing NASCET criteria, using the distal internal carotid diameter as the denominator. CONTRAST:  49m OMNIPAQUE IOHEXOL 350 MG/ML SOLN COMPARISON:  None. FINDINGS: CTA NECK FINDINGS SKELETON: There is no bony spinal canal stenosis. No lytic or blastic lesion. 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 UPPER CHEST: No pneumothorax or pleural effusion. No nodules or masses. AORTIC ARCH: There is mild calcific atherosclerosis of the aortic arch. There is no aneurysm, dissection or hemodynamically significant stenosis of the visualized portion of the aorta. Conventional 3 vessel aortic branching pattern. The visualized proximal subclavian arteries are widely patent. RIGHT CAROTID  SYSTEM: Normal without aneurysm, dissection or stenosis. LEFT CAROTID SYSTEM: Normal without aneurysm, dissection or stenosis. VERTEBRAL ARTERIES: Right dominant configuration. Both origins are clearly patent. There is no dissection, occlusion or flow-limiting stenosis to the skull base (V1-V3 segments). CTA HEAD FINDINGS POSTERIOR CIRCULATION: --Vertebral arteries: Normal V4 segments. --Posterior inferior cerebellar arteries (PICA): Patent origins from the vertebral arteries. --Anterior inferior cerebellar arteries (AICA): Patent origins from the basilar artery. --Basilar artery: Normal. --Superior cerebellar arteries: Normal. --Posterior cerebral arteries: The right PCA is occluded at the P1 P2 junction. ANTERIOR CIRCULATION: --Intracranial internal carotid arteries: Atherosclerotic calcification of the internal carotid arteries at the skull base without hemodynamically significant stenosis. --Anterior cerebral arteries (ACA): Normal. Absent right A1 segment, normal variant --Middle cerebral arteries (MCA): The M1 segments are normal. There is multifocal severe stenosis of the M2 segments bilaterally. VENOUS SINUSES: As permitted by  contrast timing, patent. ANATOMIC VARIANTS: None Review of the MIP images confirms the above findings. IMPRESSION: 1. 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 2. Bilateral multifocal severe M2 segment stenoses. 3. 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 Histologic sampling should be considered due to the degree of overlap of imaging features between benign and malignant parotid tumors. 4. Aortic Atherosclerosis (ICD10-I70.0). Electronically Signed   By: Ulyses Jarred M.D.   On: 08/27/2019 03:34   CT Head Wo Contrast  Result Date: 08/27/2019 CLINICAL DATA:  History of cocaine abuse.  Status post fall. EXAM: CT HEAD WITHOUT CONTRAST TECHNIQUE: Contiguous axial images were obtained from the base of the skull through the vertex without intravenous contrast. COMPARISON:  Brain MRI 08/27/2019.  Head CT scan 08/26/2019. FINDINGS: Brain: No evidence of hemorrhage, hydrocephalus, extra-axial collection or mass lesion/mass effect. Acute right PCA territory infarct without hemorrhagic transformation is again seen. Atrophy, chronic microvascular ischemic change, remote left ACA infarct and remote bilateral cerebellar infarcts, larger on the right also again seen. Vascular: Atherosclerosis. Skull: Intact.  No focal lesion. Sinuses/Orbits: Negative. Other: None. IMPRESSION: Acute right PCA territory infarct is identified as seen on the comparison examinations. Negative for hemorrhagic transformation. Atrophy, chronic microvascular ischemic change and remote infarcts as described above. Electronically Signed   By: Inge Rise M.D.   On: 08/27/2019 12:54   CT Head  Result Date: 08/26/2019 CLINICAL DATA:  Status post fall, altered mental status. EXAM: CT HEAD WITHOUT CONTRAST TECHNIQUE: Contiguous axial images were obtained from the base of the skull through the vertex without intravenous contrast.  COMPARISON:  Feb 03, 2013 FINDINGS: Brain: There is no midline shift, hydrocephalus, or mass. No acute hemorrhage is identified. There is lucency identified in the right cerebellum, likely due to an infarct, age indeterminate. Old infarct is identified in the left cerebellum. Encephalomalacia of the left frontal lobe is identified consistent with old infarct. Small old lacunar infarctions are identified in bilateral basal ganglia. Vascular: No hyperdense vessel is noted. Skull: Normal. Negative for fracture or focal lesion. Sinuses/Orbits: No acute finding. Other: None. IMPRESSION: 1. There is lucency identified in the right cerebellum, likely due to an infarct, age indeterminate. 2. Old infarcts identified in the left frontal lobe, left cerebellum, and bilateral basal ganglia. Electronically Signed   By: Abelardo Diesel M.D.   On: 08/26/2019 20:05   CT ANGIO NECK W OR WO CONTRAST  Result Date: 08/27/2019 CLINICAL DATA:  Stroke follow-up. Known right PCA stroke. EXAM: CT ANGIOGRAPHY HEAD AND NECK TECHNIQUE:  Multidetector CT imaging of the head and neck was performed using the standard protocol during bolus administration of intravenous contrast. Multiplanar CT image reconstructions and MIPs were obtained to evaluate the vascular anatomy. Carotid stenosis measurements (when applicable) are obtained utilizing NASCET criteria, using the distal internal carotid diameter as the denominator. CONTRAST:  47m OMNIPAQUE IOHEXOL 350 MG/ML SOLN COMPARISON:  None. FINDINGS: CTA NECK FINDINGS SKELETON: There is no bony spinal canal stenosis. No lytic or blastic lesion. 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 UPPER CHEST: No pneumothorax or pleural effusion. No nodules or masses. AORTIC ARCH: There is mild calcific atherosclerosis of the aortic arch. There is no aneurysm, dissection or hemodynamically significant stenosis of the visualized portion of the aorta.  Conventional 3 vessel aortic branching pattern. The visualized proximal subclavian arteries are widely patent. RIGHT CAROTID SYSTEM: Normal without aneurysm, dissection or stenosis. LEFT CAROTID SYSTEM: Normal without aneurysm, dissection or stenosis. VERTEBRAL ARTERIES: Right dominant configuration. Both origins are clearly patent. There is no dissection, occlusion or flow-limiting stenosis to the skull base (V1-V3 segments). CTA HEAD FINDINGS POSTERIOR CIRCULATION: --Vertebral arteries: Normal V4 segments. --Posterior inferior cerebellar arteries (PICA): Patent origins from the vertebral arteries. --Anterior inferior cerebellar arteries (AICA): Patent origins from the basilar artery. --Basilar artery: Normal. --Superior cerebellar arteries: Normal. --Posterior cerebral arteries: The right PCA is occluded at the P1 P2 junction. ANTERIOR CIRCULATION: --Intracranial internal carotid arteries: Atherosclerotic calcification of the internal carotid arteries at the skull base without hemodynamically significant stenosis. --Anterior cerebral arteries (ACA): Normal. Absent right A1 segment, normal variant --Middle cerebral arteries (MCA): The M1 segments are normal. There is multifocal severe stenosis of the M2 segments bilaterally. VENOUS SINUSES: As permitted by contrast timing, patent. ANATOMIC VARIANTS: None Review of the MIP images confirms the above findings. IMPRESSION: 1. 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 2. Bilateral multifocal severe M2 segment stenoses. 3. 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 Histologic sampling should be considered due to the degree of overlap of imaging features between benign and malignant parotid tumors. 4. Aortic Atherosclerosis (ICD10-I70.0). Electronically Signed   By: KUlyses JarredM.D.   On: 08/27/2019 03:34   MR BRAIN WO CONTRAST  Result Date:  08/27/2019 CLINICAL DATA:  Encephalopathy EXAM: MRI HEAD WITHOUT CONTRAST TECHNIQUE: Multiplanar, multiecho pulse sequences of the brain and surrounding structures were obtained without intravenous contrast. COMPARISON:  Brain MRI 02/03/2013 and head CT 08/26/2019 FINDINGS: Brain: There is a large right PCA territory infarct predominantly affecting the medial temporal lobe and occipital lobe with smaller foci of ischemia in the right thalamus and right middle cerebellar peduncle. There is a large area of encephalomalacia within the left anterior cerebral artery territory. There is diffuse confluent white matter hyperintensity compatible with ischemic microangiopathy. There are old bilateral cerebellar infarcts. Vascular: There is loss of the normal right PCA flow void. There are 10-20 scattered foci of chronic microhemorrhage in a predominantly peripheral distribution. No acute hemorrhage. Skull and upper cervical spine: The bone marrow signal of the cranium and upper cervical vertebrae is normal. There is no skull base lesion. The visualized upper cervical spinal cord is normal. Sinuses/Orbits: There is no paranasal sinus fluid level or advanced mucosal thickening. There is no mastoid or middle ear effusion. The orbits are normal. Other: None IMPRESSION: 1. Large, acute right PCA territory infarct involving  the medial temporal lobe and occipital lobe with additional smaller foci of acute ischemia within the right thalamus and right middle cerebellar peduncle. 2. Loss of the normal right PCA flow void consistent with occlusion. 3. Multiple foci of chronic microhemorrhage in a predominantly peripheral distribution. 4. No acute hemorrhage or mass effect. Electronically Signed   By: Ulyses Jarred M.D.   On: 08/27/2019 02:13   XR Chest Single View  Result Date: 08/26/2019 CLINICAL DATA:  Altered behavior, confusion and falls today EXAM: PORTABLE CHEST 1 VIEW COMPARISON:  Radiograph Feb 03, 2013 FINDINGS:  Nonspecific elevation of the right hemidiaphragm with adjacent opacity likely reflecting atelectasis. No consolidation, convincing features of edema, pneumothorax or effusion. The aorta is calcified. The remaining cardiomediastinal contours are unremarkable. Degenerative changes are present in the imaged spine and shoulders. No acute osseous or soft tissue abnormality. IMPRESSION: 1. Nonspecific elevation of the right hemidiaphragm with adjacent opacity likely reflecting atelectasis. 2.  No other acute cardiopulmonary abnormality. Electronically Signed   By: Lovena Le M.D.   On: 08/26/2019 20:30   ECHOCARDIOGRAM COMPLETE  Result Date: 08/27/2019   ECHOCARDIOGRAM REPORT   Patient Name:   [name] Date of Exam: 08/27/2019 Medical Rec #:  [id number]     Height:       69.0 in Accession #:    [id number]    Weight:       188.0 lb Date of Birth:  1955/09/25     BSA:          2.01 m Patient Age:    76 years      BP:           132/101 mmHg Patient Gender: M             HR:           61 bpm. Exam Location:  Inpatient Procedure: 2D Echo Indications:    Stroke 434.91/I163.9  History:        Patient has prior history of Echocardiogram examinations, most                 recent 02/09/2013. Stroke; Risk Factors:Hypertension.  Sonographer:    Clayton Lefort RDCS (AE) Referring Phys: [id number] Jani Gravel  Sonographer Comments: Patient unable to hold breath when requested. IMPRESSIONS  1. Left ventricular ejection fraction, by visual estimation, is 65 to 70%. The left ventricle has normal function. There is severely increased left ventricular hypertrophy.  2. Left ventricular diastolic parameters are consistent with Grade I diastolic dysfunction (impaired relaxation).  3. The left ventricle has no regional wall motion abnormalities.  4. Global right ventricle has normal systolic function.The right ventricular size is normal. No increase in right ventricular wall thickness.  5. Left atrial size was normal.  6. Right atrial size was  normal.  7. The mitral valve is normal in structure. Trivial mitral valve regurgitation.  8. The tricuspid valve is normal in structure. Tricuspid valve regurgitation is not demonstrated.  9. The aortic valve is normal in structure. Aortic valve regurgitation is not visualized. Mild aortic valve sclerosis without stenosis. 10. The pulmonic valve was grossly normal. Pulmonic valve regurgitation is not visualized. 11. The atrial septum is grossly normal. FINDINGS  Left Ventricle: Left ventricular ejection fraction, by visual estimation, is 65 to 70%. The left ventricle has normal function. The left ventricle has no regional wall motion abnormalities. There is severely increased left ventricular hypertrophy. Asymmetric left ventricular hypertrophy. Left ventricular diastolic parameters are consistent with Grade I diastolic  dysfunction (impaired relaxation). Right Ventricle: The right ventricular size is normal. No increase in right ventricular wall thickness. Global RV systolic function is has normal systolic function. Left Atrium: Left atrial size was normal in size. Right Atrium: Right atrial size was normal in size Pericardium: There is no evidence of pericardial effusion. Mitral Valve: The mitral valve is normal in structure. Trivial mitral valve regurgitation. Tricuspid Valve: The tricuspid valve is normal in structure. Tricuspid valve regurgitation is not demonstrated. Aortic Valve: The aortic valve is normal in structure. Aortic valve regurgitation is not visualized. Mild aortic valve sclerosis is present, with no evidence of aortic valve stenosis. Aortic valve mean gradient measures 4.0 mmHg. Aortic valve peak gradient measures 7.2 mmHg. Aortic valve area, by VTI measures 2.46 cm. Pulmonic Valve: The pulmonic valve was grossly normal. Pulmonic valve regurgitation is not visualized. Pulmonic regurgitation is not visualized. Aorta: The aortic root and ascending aorta are structurally normal, with no evidence of  dilitation. IAS/Shunts: The atrial septum is grossly normal.  LEFT VENTRICLE PLAX 2D LVIDd:         3.90 cm  Diastology LVIDs:         2.20 cm  LV e' lateral:   6.53 cm/s LV PW:         1.40 cm  LV E/e' lateral: 9.3 LV IVS:        1.90 cm  LV e' medial:    5.87 cm/s LVOT diam:     2.00 cm  LV E/e' medial:  10.4 LV SV:         50 ml LV SV Index:   24.19 LVOT Area:     3.14 cm  RIGHT VENTRICLE RV S prime:     11.00 cm/s TAPSE (M-mode): 1.7 cm LEFT ATRIUM           Index       RIGHT ATRIUM           Index LA diam:      2.70 cm 1.34 cm/m  RA Area:     15.00 cm LA Vol (A2C): 65.5 ml 32.55 ml/m RA Volume:   36.00 ml  17.89 ml/m LA Vol (A4C): 32.6 ml 16.20 ml/m  AORTIC VALVE AV Area (Vmax):    2.67 cm AV Area (Vmean):   2.63 cm AV Area (VTI):     2.46 cm AV Vmax:           134.00 cm/s AV Vmean:          91.000 cm/s AV VTI:            0.290 m AV Peak Grad:      7.2 mmHg AV Mean Grad:      4.0 mmHg LVOT Vmax:         114.00 cm/s LVOT Vmean:        76.100 cm/s LVOT VTI:          0.227 m LVOT/AV VTI ratio: 0.78  AORTA Ao Root diam: 3.40 cm Ao Asc diam:  2.90 cm MITRAL VALVE MV Area (PHT): 2.66 cm             SHUNTS MV PHT:        82.65 msec           Systemic VTI:  0.23 m MV Decel Time: 285 msec             Systemic Diam: 2.00 cm MV E velocity: 61.00 cm/s 103 cm/s MV A velocity: 84.40 cm/s 70.3  cm/s MV E/A ratio:  0.72       1.5  Mertie Moores MD Electronically signed by Mertie Moores MD Signature Date/Time: 08/27/2019/12:29:31 PM    Final      Time Spent in minutes  30     Desiree Hane M.D on 08/31/2019 at 4:48 PM  To page go to [email or web address] - password [facility]

## 2019-09-01 LAB — BASIC METABOLIC PANEL
Anion gap: 13 (ref 5–15)
BUN: 24 mg/dL — ABNORMAL HIGH (ref 8–23)
CO2: 23 mmol/L (ref 22–32)
Calcium: 9 mg/dL (ref 8.9–10.3)
Chloride: 104 mmol/L (ref 98–111)
Creatinine, Ser: 1.6 mg/dL — ABNORMAL HIGH (ref 0.61–1.24)
GFR calc Af Amer: 53 mL/min — ABNORMAL LOW (ref >60)
GFR calc non Af Amer: 45 mL/min — ABNORMAL LOW (ref >60)
Glucose, Bld: 120 mg/dL — ABNORMAL HIGH (ref 70–99)
Potassium: 4.2 mmol/L (ref 3.5–5.1)
Sodium: 140 mmol/L (ref 135–145)

## 2019-09-01 NOTE — Progress Notes (Signed)
Inpatient Rehabilitation-Admissions Coordinator   Insurance has denied the patient's request for CIR. MD aware. I have notified the pt's wife. She is open to SNF. I will notify [facility] team as the pt's wife is requesting to speak with someone about SNF options.   AC will sign off.   Please call if questions.   Jhonnie Garner, OTR/L  Rehab Admissions Coordinator  [phone] 09/01/2019 12:56 PM

## 2019-09-01 NOTE — NC FL2 (Signed)
[place] MEDICAID FL2 LEVEL OF CARE SCREENING TOOL     IDENTIFICATION  Patient Name: [name] Birthdate: [birth year] Sex: male Admission Date (Current Location): 08/26/2019  [facility] and [place] Number:  Herbalist and Address:  [facility], [place] [address], [place], [place] [address]      Provider Number: [id number]  Attending Physician Name and Address:  Dessa Phi, DO  Relative Name and Phone Number:  Mckenley Hopper (Spouse) [phone]    Current Level of Care: Hospital Recommended Level of Care: [place] Prior Approval Number:    Date Approved/Denied:   PASRR Number: [id number]  Discharge Plan: SNF    Current Diagnoses: Patient Active Problem List   Diagnosis Date Noted  . Hypokalemia 08/31/2019  . Cocaine abuse ([place]) 08/29/2019  . AKI (acute kidney injury) ([place]) 08/29/2019  . Hyperlipidemia 08/29/2019  . Confusion 08/29/2019  . Acute ischemic right PCA stroke ([place]) 08/27/2019  . Aphasia as late effect of cerebrovascular accident 05/14/2013  . 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 Spastic hemiplegia affecting dominant side ([place]) 03/12/2013  . 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 CVA (cerebral infarction) 02/03/2013  . HTN (hypertension) 02/03/2013  . Tobacco abuse 02/03/2013    Orientation RESPIRATION BLADDER Height & Weight     Self, Time, Situation, Place(orientation fluctuates)  Normal Incontinent Weight:   Height:     BEHAVIORAL SYMPTOMS/MOOD NEUROLOGICAL BOWEL NUTRITION STATUS      Incontinent Diet(refer to d/c summary)  AMBULATORY STATUS COMMUNICATION OF NEEDS Skin   Extensive Assist Verbally Normal                       Personal Care Assistance Level of Assistance  Bathing, Feeding, Dressing Bathing Assistance: Maximum assistance Feeding assistance: Limited assistance Dressing Assistance: Maximum assistance     Functional Limitations Info  Sight,  Hearing, Speech Sight Info: Adequate Hearing Info: Adequate Speech Info: Adequate    SPECIAL CARE FACTORS FREQUENCY  PT (By licensed PT), OT (By licensed OT)     PT Frequency: 5 x / week OT Frequency: 5 x/ week            Contractures Contractures Info: Not present    Additional Factors Info  Code Status, Allergies Code Status Info: full code Allergies Info: Shellfish Allergy           Current Medications (09/01/2019):  This is the current hospital active medication list Current Facility-Administered Medications  Medication Dose Route Frequency Provider Last Rate Last Admin  .  stroke: mapping our early stages of recovery book   Does not apply Once Jani Gravel, MD      . acetaminophen (TYLENOL) tablet 650 mg  650 mg Oral Q4H PRN Jani Gravel, MD       Or  . acetaminophen (TYLENOL) 160 MG/5ML solution 650 mg  650 mg Per Tube Q4H PRN Jani Gravel, MD       Or  . acetaminophen (TYLENOL) suppository 650 mg  650 mg Rectal Q4H PRN Jani Gravel, MD      . aspirin suppository 300 mg  300 mg Rectal Daily Jani Gravel, MD       Or  . aspirin tablet 325 mg  325 mg Oral Daily Jani Gravel, MD   325 mg at 09/01/19 0845  . atorvastatin (LIPITOR) tablet 80 mg  80 mg Oral q1800 Jani Gravel, MD   80 mg at 09/01/19 1627  . clopidogrel (PLAVIX) tablet  75 mg  75 mg Oral Daily Oretha Milch D, MD   75 mg at 09/01/19 0845  . hydrochlorothiazide (HYDRODIURIL) tablet 25 mg  25 mg Oral Daily Jani Gravel, MD   25 mg at 09/01/19 0845  . labetalol (NORMODYNE) injection 10 mg  10 mg Intravenous Once PRN Maudie Flakes, MD      . lisinopril (ZESTRIL) tablet 10 mg  10 mg Oral Daily Jani Gravel, MD   10 mg at 09/01/19 0845     Discharge Medications: Please see discharge summary for a list of discharge medications.  Relevant Imaging Results:  Relevant Lab Results:   Additional Information SS # [id number]  Sharin Mons, RN

## 2019-09-01 NOTE — Progress Notes (Signed)
PROGRESS NOTE    [name]  [id number] DOB: [birth year] DOA: 08/26/2019 PCP: Alroy Dust, L.[name], MD     Brief Narrative:  [name] is a 63 y.o. year old male with medical history significant for hypertension and previous stroke with some residual deficit who presented on 08/26/2019 with worsening confusion and several falls at home x1 day and was found to have hypertensive emergency with large occipital stroke. In ED, initially noted to be hypertensive with blood pressure 230/125 and creatinine of 1.3.  Upon evaluation in the ED patient was intermittently following commands with noticeable facial droop and slurred speech per family consistent with his baseline of previous stroke.  CT head initially revealed age-indeterminate infarct.  Given persistently high blood pressure and intermittent confusion, neurology was consulted who recommended permissive hypertension and ordered MRI which showed a large occipital lobe stroke. Hospital course complicated by continued confusion. CT head done and showed no acute change from prior stroke.   New events last 24 hours / Subjective: No acute events.   Assessment & Plan:   Principal Problem:   Acute ischemic right PCA stroke (HCC) Active Problems:   HTN (hypertension)   Tobacco abuse   Cocaine abuse ([place])   AKI (acute kidney injury) ([place])   Hyperlipidemia   Confusion   Hypokalemia    Large PCA territory infarct on the right, stable -History of previous stroke.  Found to be cocaine positive.  Likely in the setting of hypertensive emergency and substance abuse, likely thrombotic could also be embolic.  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  Preserved EF on TTE without PFO.  A1c 6.2, LDL 107, UDS positive for cocaine -Neurology f/u with [facility] in 4 weeks -Cardiology will arrange 30 day cardiac event monitoring for Afib rule out -Aspirin 325 mg p.o.,  plavix 75 mg qd for DAPT x 3 months, then plavix alone per  neurology  Acute metabolic encephalopathy -Will likely be prone to bouts of confusion given large PCA stroke  -Repeat CT head shows no evolution or worsening of known disease after fall in ED on admission  Essential HTN  -Allow permissive hypertension, okay if less than 220/120 over the next 5 to 7 days post acute CVA -Lisinopril, HCTZ, labetalol as needed  Substance abuse. 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 SNF placement pending   Consultants:   Neurology  Procedures:   None   Antimicrobials:  Anti-infectives (From admission, onward)   None        Objective: Vitals:   08/31/19 0913 08/31/19 1607 08/31/19 2311 09/01/19 0742  BP: (!) 165/118 (!) 150/105 (!) 159/99 (!) 149/95  Pulse: (!) 108 89 98 91  Resp:   (!) 9 17  Temp:  98 F (36.7 C) 98.3 F (36.8 C) 98.4 F (36.9 C)  TempSrc:  Axillary Oral Oral  SpO2: 97%  92% 100%    Intake/Output Summary (Last 24 hours) at 09/01/2019 1236 Last data filed at 09/01/2019  0900 Gross per 24 hour  Intake 15 ml  Output 900 ml  Net -885 ml   There were no vitals filed for this visit.  Examination:  General exam: Appears calm and comfortable  Respiratory system: Clear to auscultation. Respiratory effort normal. No respiratory distress. No conversational dyspnea.  Cardiovascular system: S1 & S2 heard, RRR. No murmurs. No pedal  edema. Gastrointestinal system: Abdomen is nondistended, soft and nontender. Normal bowel sounds heard. Central nervous system: Alert   Extremities: Symmetric in appearance  Skin: No rashes, lesions or ulcers on exposed skin   Data Reviewed: I have personally reviewed following labs and imaging studies  CBC: Recent Labs  Lab 08/26/19 2047 08/27/19 0846  WBC 8.5 8.3  HGB 17.0 15.9  HCT 52.3* 49.5  MCV 84.5 83.8  PLT 265 [id number]   Basic Metabolic Panel: Recent Labs  Lab 08/27/19 0846 08/28/19 0237 08/29/19 0349 08/30/19 1603 08/31/19 0909 09/01/19 0149  NA 141 136 139 139  --  140  K 3.5 3.2* 3.0* 3.0*  --  4.2  CL 105 99 101 103  --  104  CO2 25 22 26 25   --  23  GLUCOSE 100* 106* 124* 119*  --  120*  BUN 7* 6* 11 13  --  24*  CREATININE 1.30* 1.33* 1.53* 1.29*  --  1.60*  CALCIUM 9.1 9.2 9.1 9.2  --  9.0  MG  --   --  2.0  --  1.9  --    GFR: CrCl cannot be calculated (Unknown ideal weight.). Liver Function Tests: Recent Labs  Lab 08/26/19 2047 08/27/19 0846  AST 16 20  ALT 25 24  ALKPHOS 140* 140*  BILITOT 1.1 1.9*  PROT 7.4 7.1  ALBUMIN 3.9 3.8   No results for input(s): LIPASE, AMYLASE in the last 168 hours. Recent Labs  Lab 08/27/19 0033  AMMONIA 47*   Coagulation Profile: No results for input(s): INR, PROTIME in the last 168 hours. Cardiac Enzymes: No results for input(s): CKTOTAL, CKMB, CKMBINDEX, TROPONINI in the last 168 hours. BNP (last 3 results) No results for input(s): PROBNP in the last 8760 hours. HbA1C: No results for input(s): HGBA1C in the last 72 hours. CBG: Recent Labs  Lab 08/26/19 1926  GLUCAP 120*   Lipid Profile: No results for input(s): CHOL, HDL, LDLCALC, TRIG, CHOLHDL, LDLDIRECT in the last 72 hours. Thyroid Function Tests: No results for input(s): TSH, T4TOTAL, FREET4, T3FREE, THYROIDAB in the last 72 hours. Anemia Panel: No results for input(s): VITAMINB12, FOLATE, FERRITIN, TIBC, IRON, RETICCTPCT in the last 72  hours. Sepsis Labs: Recent Labs  Lab 08/26/19 2047  LATICACIDVEN 2.0*    Recent Results (from the past 240 hour(s))  SARS CORONAVIRUS 2 (TAT 6-24 HRS) Nasopharyngeal Nasopharyngeal Swab     Status: None   Collection Time: 08/26/19  9:54 PM   Specimen: Nasopharyngeal Swab  Result Value Ref Range Status   SARS Coronavirus 2 NEGATIVE NEGATIVE Final    Comment: (NOTE) SARS-CoV-2 target nucleic acids are NOT DETECTED. The SARS-CoV-2 RNA is generally detectable in upper and lower respiratory specimens during the acute phase of infection. Negative results do not preclude SARS-CoV-2 infection, do not rule out co-infections with other pathogens, and should not be used as the sole basis for treatment or other patient management decisions. Negative results must be combined with clinical observations, patient history, and epidemiological information. The expected result is Negative. Fact Sheet for Patients: SugarRoll.be Fact Sheet for Healthcare Providers: https://www.woods-mathews.com/ This test is  not yet approved or cleared by the [place] and  has been authorized for detection and/or diagnosis of SARS-CoV-2 by FDA under an Emergency Use Authorization (EUA). This EUA will remain  in effect (meaning this test can be used) for the duration of the COVID-19 declaration under Section 56 4(b)(1) of the Act, 21 U.S.C. section 360bbb-3(b)(1), unless the authorization is terminated or revoked sooner. Performed at [facility], [place] [address], [place], [place] [address]       Radiology Studies: No results found.    Scheduled Meds: .  stroke: mapping our early stages of recovery book   Does not apply Once  . aspirin  300 mg Rectal Daily   Or  . aspirin  325 mg Oral Daily  . atorvastatin  80 mg Oral q1800  . clopidogrel  75 mg Oral Daily  . hydrochlorothiazide  25 mg Oral Daily  . lisinopril  10 mg Oral Daily   Continuous  Infusions:   LOS: 5 days      Time spent: 35 minutes   Dessa Phi, DO [facility] 09/01/2019, 12:36 PM   Available via Epic secure chat 7am-7pm After these hours, please refer to coverage provider listed on [email or web address]

## 2019-09-01 NOTE — Progress Notes (Signed)
Physical Therapy Treatment Patient Details Name: [name] MRN: [id number] DOB: Jan 30, 1956 Today's Date: 09/01/2019    History of Present Illness [name] is a 63 y.o. year old male with medical history significant for hypertension and previous stroke with some residual deficit who presented on 08/26/2019 with worsening confusion and several falls at home x1 day and was found to have hypertensive emergency with large occipital stroke.    PT Comments    Patient received in bed, more alert and awake today but continues to require MaxA+2 for functional bed mobility, ModA to maintain upright at EOB. After sitting at EOB for a couple of minutes, he threw his upper body to the right back into bed, required maxAx2 to return to sitting, then snapped at therapist "what are you doing!!!" before throwing himself back down to the right. Refused to further participate. Returned to bed with maxAx2 and positioned to comfort, all needs otherwise met this afternoon, RN aware of patient status and behavior. Per RN, insurance has denied CIR- updated PT recommendations and frequency accordingly.     Follow Up Recommendations  SNF;Supervision/Assistance - 24 hour     Equipment Recommendations  Hospital bed;Wheelchair (measurements PT);Wheelchair cushion (measurements PT);Other (comment)(tilt in space WC, hoyer lift)    Recommendations for Other Services       Precautions / Restrictions Precautions Precautions: Fall Precaution Comments: fell in ED Restrictions Weight Bearing Restrictions: No    Mobility  Bed Mobility Overal bed mobility: Needs Assistance Bed Mobility: Supine to Sit;Sit to Supine Rolling: Max assist;+2 for physical assistance   Supine to sit: Max assist;+2 for physical assistance     General bed mobility comments: MaxAx2 for all bed mobility, ModA to maintain upright at EOB due to posterior lean; threw himself to R (back to bed), then MaxAx2 to sit back up, patient then  agitatedly stated "what are you doing???" and refused to further participate, threw himself back down to R again  Transfers                 General transfer comment: pt refused  Ambulation/Gait             General Gait Details: pt refused   Stairs             Wheelchair Mobility    Modified Rankin (Stroke Patients Only)       Balance Overall balance assessment: Needs assistance Sitting-balance support: Feet supported Sitting balance-Leahy Scale: Poor Sitting balance - Comments: ModA to maintain upright sitting position                                    Cognition Arousal/Alertness: Awake/alert Behavior During Therapy: Flat affect;Agitated Overall Cognitive Status: Impaired/Different from baseline Area of Impairment: Orientation;Attention;Memory;Following commands;Safety/judgement;Awareness;Problem solving                 Orientation Level: Disoriented to;Situation;Time Current Attention Level: Focused Memory: Decreased recall of precautions;Decreased short-term memory Following Commands: Follows one step commands inconsistently;Follows one step commands with increased time Safety/Judgement: Decreased awareness of safety;Decreased awareness of deficits Awareness: Intellectual Problem Solving: Decreased initiation;Requires verbal cues;Requires tactile cues;Slow processing;Difficulty sequencing General Comments: much more awake today but seemed to become annoyed and agitated with PT/Tech      Exercises      General Comments General comments (skin integrity, edema, etc.): annoyed/agitated today and ultimately stated he did not want to participate in therapy  Pertinent Vitals/Pain Pain Assessment: Faces Pain Score: 0-No pain Faces Pain Scale: No hurt Pain Intervention(s): Limited activity within patient's tolerance;Monitored during session    Home Living                      Prior Function            PT  Goals (current goals can now be found in the care plan section) Acute Rehab PT Goals Patient Stated Goal: none PT Goal Formulation: Patient unable to participate in goal setting Time For Goal Achievement: 09/10/19 Potential to Achieve Goals: Good Progress towards PT goals: Not progressing toward goals - comment(limited by low participation today)    Frequency    Min 3X/week      PT Plan Discharge plan needs to be updated;Frequency needs to be updated;Equipment recommendations need to be updated    Co-evaluation              AM-PAC PT "6 Clicks" Mobility   Outcome Measure  Help needed turning from your back to your side while in a flat bed without using bedrails?: A Lot Help needed moving from lying on your back to sitting on the side of a flat bed without using bedrails?: Total Help needed moving to and from a bed to a chair (including a wheelchair)?: Total Help needed standing up from a chair using your arms (e.g., wheelchair or bedside chair)?: Total Help needed to walk in hospital room?: Total Help needed climbing 3-5 steps with a railing? : Total 6 Click Score: 7    End of Session Equipment Utilized During Treatment: Gait belt Activity Tolerance: No increased pain;Patient tolerated treatment well Patient left: in bed;with call bell/phone within reach;with bed alarm set;with restraints reapplied Nurse Communication: Mobility status;Other (comment)(agitation with PT efforts) PT Visit Diagnosis: Other abnormalities of gait and mobility (R26.89);Other symptoms and signs involving the nervous system (R29.898);History of falling (Z91.81);Repeated falls (R29.6)     Time: 3846-6599 PT Time Calculation (min) (ACUTE ONLY): 15 min  Charges:  $Therapeutic Activity: 8-22 mins                     Windell Norfolk, DPT, PN1   Supplemental Physical Therapist [place]    Pager [phone] Acute Rehab Office [phone]

## 2019-09-01 NOTE — TOC Progression Note (Addendum)
Transition of Care [facility]) - Progression Note    Patient Details  Name: [name] MRN: [id number] Date of Birth: Oct 29, 1955  [facility] CM/SW Contact  Sharin Mons, RN Phone Number: 09/01/2019, 1:22 PM  Clinical Narrative:     Pt with recent denial for  CIR. NCM received consult for possible SNF placement at time of discharge. NCM spoke with patient's wife ( pt with fluctuating orientation)  regarding recommendation of SNF placement at time of discharge. Patient is currently unable to care for self at their home given patient's current physical needs and fall risk. Wife agreeable to SNF placement. NCM discussed SNF referral and  insurance authorization process.No further questions reported at this time. NCM to continue to follow and assist with discharge planning needs.  Referrals faxed out to SNFs for bed offers.....  Expected Discharge Plan: [place] Barriers to Discharge: Continued Medical Work up  Expected Discharge Plan and Services Expected Discharge Plan: [place]   Discharge Planning Services: CM Consult   Living arrangements for the past 2 months: Single Family Home                                       Social Determinants of Health (SDOH) Interventions    Readmission Risk Interventions No flowsheet data found.

## 2019-09-02 LAB — BASIC METABOLIC PANEL
Anion gap: 14 (ref 5–15)
BUN: 31 mg/dL — ABNORMAL HIGH (ref 8–23)
CO2: 23 mmol/L (ref 22–32)
Calcium: 9.3 mg/dL (ref 8.9–10.3)
Chloride: 104 mmol/L (ref 98–111)
Creatinine, Ser: 1.48 mg/dL — ABNORMAL HIGH (ref 0.61–1.24)
GFR calc Af Amer: 58 mL/min — ABNORMAL LOW (ref >60)
GFR calc non Af Amer: 50 mL/min — ABNORMAL LOW (ref >60)
Glucose, Bld: 118 mg/dL — ABNORMAL HIGH (ref 70–99)
Potassium: 3.6 mmol/L (ref 3.5–5.1)
Sodium: 141 mmol/L (ref 135–145)

## 2019-09-02 NOTE — Progress Notes (Signed)
PROGRESS NOTE    [name]  [id number] DOB: [birth year] DOA: 08/26/2019 PCP: Alroy Dust, L.[name], MD     Brief Narrative:  [name] is a 63 y.o. year old male with medical history significant for hypertension and previous stroke with some residual deficit who presented on 08/26/2019 with worsening confusion and several falls at home x1 day and was found to have hypertensive emergency with large occipital stroke. In ED, initially noted to be hypertensive with blood pressure 230/125 and creatinine of 1.3.  Upon evaluation in the ED patient was intermittently following commands with noticeable facial droop and slurred speech per family consistent with his baseline of previous stroke.  CT head initially revealed age-indeterminate infarct.  Given persistently high blood pressure and intermittent confusion, neurology was consulted who recommended permissive hypertension and ordered MRI which showed a large occipital lobe stroke. Hospital course complicated by continued confusion. CT head done and showed no acute change from prior stroke.   New events last 24 hours / Subjective: Sitting in bed, eating breakfast.  No acute events overnight.  Awaiting SNF placement  Assessment & Plan:   Principal Problem:   Acute ischemic right PCA stroke (HCC) Active Problems:   HTN (hypertension)   Tobacco abuse   Cocaine abuse ([place])   AKI (acute kidney injury) ([place])   Hyperlipidemia   Confusion   Hypokalemia    Large PCA territory infarct on the right, stable -History of previous stroke.  Found to be cocaine positive.  Likely in the setting of hypertensive emergency and substance abuse, likely thrombotic could also be embolic.  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  Preserved EF on TTE without PFO.  A1c 6.2, LDL 107, UDS positive for cocaine -Neurology f/u with [facility] in 4 weeks -Cardiology will arrange 30 day cardiac event monitoring for Afib rule out -Aspirin 325 mg p.o.,  plavix  75 mg qd for DAPT x 3 months, then plavix alone per neurology  Acute metabolic encephalopathy -Will likely be prone to bouts of confusion given large PCA stroke  -Repeat CT head shows no evolution or worsening of known disease after fall in ED on admission  Essential HTN  -Allow permissive hypertension, okay if less than 220/120 over the next 5 to 7 days post acute CVA -Lisinopril, HCTZ, labetalol as needed  Substance abuse. 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 -Incidentally found on imaging for stroke work-up -Histologic sampling should be considered due to degree of overlap of imaging features between benign and malignant parotid tumors by ENT  AKI vs CKD stage 3b  -Last creatinine from 2014 was 1.14 -1.32 on admission, has ranged from 1.3-1.6 this admission  -Stable overall   DVT prophylaxis: SCD Code Status: Full Family Communication: None at bedside Disposition Plan: SNF placement pending   Consultants:   Neurology  Procedures:   None   Antimicrobials:  Anti-infectives (From admission, onward)   None       Objective: Vitals:   09/01/19 1502 09/01/19 2305 09/02/19 0300 09/02/19 0851  BP: (!) 146/94 (!) 162/103 137/77 140/90  Pulse: 88 78 81 95  Resp: 17 16 18 12   Temp: 97.9 F (36.6 C) 98 F (36.7 C) 98.2 F (36.8 C) 97.7 F (36.5 C)  TempSrc: Oral Oral Oral Axillary  SpO2: 100% 100% 99% 98%    Intake/Output Summary (Last 24 hours) at 09/02/2019 1009 Last data  filed at 09/02/2019 0500 Gross per 24 hour  Intake --  Output 400 ml  Net -400 ml   There were no vitals filed for this visit.   Examination: General exam: Appears calm and comfortable  Respiratory system: Clear to auscultation. Respiratory effort normal. Cardiovascular system: S1 & S2 heard, RRR. No pedal edema. Gastrointestinal system: Abdomen is  nondistended, soft and nontender. Normal bowel sounds heard. Central nervous system: Alert  Extremities: Symmetric in appearance bilaterally  Skin: No rashes, lesions or ulcers on exposed skin  Psychiatry: Stable   Data Reviewed: I have personally reviewed following labs and imaging studies  CBC: Recent Labs  Lab 08/26/19 2047 08/27/19 0846  WBC 8.5 8.3  HGB 17.0 15.9  HCT 52.3* 49.5  MCV 84.5 83.8  PLT 265 [id number]   Basic Metabolic Panel: Recent Labs  Lab 08/28/19 0237 08/29/19 0349 08/30/19 1603 08/31/19 0909 09/01/19 0149 09/02/19 0309  NA 136 139 139  --  140 141  K 3.2* 3.0* 3.0*  --  4.2 3.6  CL 99 101 103  --  104 104  CO2 22 26 25   --  23 23  GLUCOSE 106* 124* 119*  --  120* 118*  BUN 6* 11 13  --  24* 31*  CREATININE 1.33* 1.53* 1.29*  --  1.60* 1.48*  CALCIUM 9.2 9.1 9.2  --  9.0 9.3  MG  --  2.0  --  1.9  --   --    GFR: CrCl cannot be calculated (Unknown ideal weight.). Liver Function Tests: Recent Labs  Lab 08/26/19 2047 08/27/19 0846  AST 16 20  ALT 25 24  ALKPHOS 140* 140*  BILITOT 1.1 1.9*  PROT 7.4 7.1  ALBUMIN 3.9 3.8   No results for input(s): LIPASE, AMYLASE in the last 168 hours. Recent Labs  Lab 08/27/19 0033  AMMONIA 47*   Coagulation Profile: No results for input(s): INR, PROTIME in the last 168 hours. Cardiac Enzymes: No results for input(s): CKTOTAL, CKMB, CKMBINDEX, TROPONINI in the last 168 hours. BNP (last 3 results) No results for input(s): PROBNP in the last 8760 hours. HbA1C: No results for input(s): HGBA1C in the last 72 hours. CBG: Recent Labs  Lab 08/26/19 1926  GLUCAP 120*   Lipid Profile: No results for input(s): CHOL, HDL, LDLCALC, TRIG, CHOLHDL, LDLDIRECT in the last 72 hours. Thyroid Function Tests: No results for input(s): TSH, T4TOTAL, FREET4, T3FREE, THYROIDAB in the last 72 hours. Anemia Panel: No results for input(s): VITAMINB12, FOLATE, FERRITIN, TIBC, IRON, RETICCTPCT in the last 72 hours. Sepsis  Labs: Recent Labs  Lab 08/26/19 2047  LATICACIDVEN 2.0*    Recent Results (from the past 240 hour(s))  SARS CORONAVIRUS 2 (TAT 6-24 HRS) Nasopharyngeal Nasopharyngeal Swab     Status: None   Collection Time: 08/26/19  9:54 PM   Specimen: Nasopharyngeal Swab  Result Value Ref Range Status   SARS Coronavirus 2 NEGATIVE NEGATIVE Final    Comment: (NOTE) SARS-CoV-2 target nucleic acids are NOT DETECTED. The SARS-CoV-2 RNA is generally detectable in upper and lower respiratory specimens during the acute phase of infection. Negative results do not preclude SARS-CoV-2 infection, do not rule out co-infections with other pathogens, and should not be used as the sole basis for treatment or other patient management decisions. Negative results must be combined with clinical observations, patient history, and epidemiological information. The expected result is Negative. Fact Sheet for Patients: SugarRoll.be Fact Sheet for Healthcare Providers: https://www.woods-mathews.com/ This test is not yet approved or  cleared by the [place] and  has been authorized for detection and/or diagnosis of SARS-CoV-2 by FDA under an Emergency Use Authorization (EUA). This EUA will remain  in effect (meaning this test can be used) for the duration of the COVID-19 declaration under Section 56 4(b)(1) of the Act, 21 U.S.C. section 360bbb-3(b)(1), unless the authorization is terminated or revoked sooner. Performed at [facility], [place] [address], [place], [place] [address]       Radiology Studies: No results found.    Scheduled Meds: .  stroke: mapping our early stages of recovery book   Does not apply Once  . aspirin  300 mg Rectal Daily   Or  . aspirin  325 mg Oral Daily  . atorvastatin  80 mg Oral q1800  . clopidogrel  75 mg Oral Daily  . hydrochlorothiazide  25 mg Oral Daily  . lisinopril  10 mg Oral Daily   Continuous Infusions:   LOS:  6 days      Time spent: 25 minutes   Dessa Phi, DO [facility] 09/02/2019, 10:09 AM   Available via Epic secure chat 7am-7pm After these hours, please refer to coverage provider listed on [email or web address]

## 2019-09-03 NOTE — Progress Notes (Signed)
Physical Therapy Treatment Patient Details Name: [name] MRN: [id number] DOB: 01/06/1956 Today's Date: 09/03/2019    History of Present Illness [name] is a 63 y.o. year old male with medical history significant for hypertension and previous stroke with some residual deficit who presented on 08/26/2019 with worsening confusion and several falls at home x1 day and was found to have hypertensive emergency with large occipital stroke.    PT Comments    Patient received in bed, willing to participate in therapy. Continues to require maxAx2 for functional bed mobility, initially required ModA to maintain sitting balance but then able to maintain midline sitting with min guard for short periods and interacted with OT at EOB. Periodically leaned back on PT and required ModA to maintain upright, seems to be due to fatigue.  Difficulty holding head up while sitting at EOB. Seems to have a very short attention span (duration of seconds), this in combination with delayed processing can be challenging but he did well during today's treatment session. Limited by fatigue. He was left in bed with all needs met this afternoon.   Follow Up Recommendations  SNF;Supervision/Assistance - 24 hour     Equipment Recommendations  Hospital bed;Wheelchair (measurements PT);Wheelchair cushion (measurements PT);Other (comment)(tilt in space WC, hoyer lift)    Recommendations for Other Services       Precautions / Restrictions Precautions Precautions: Fall Precaution Comments: fell in ED Restrictions Weight Bearing Restrictions: No    Mobility  Bed Mobility Overal bed mobility: Needs Assistance Bed Mobility: Supine to Sit;Sit to Supine     Supine to sit: Max assist;+2 for physical assistance Sit to supine: +2 for physical assistance;Max assist   General bed mobility comments: MaxAx2 for bed mobility, once up initially required ModA which faded to min guard to maintain midline sitting at EOB;  did sometimes lean back onto therapist behind him for short periods of time possibly due to fatigue but able to use core muscles to bring hipself back upright  Transfers                 General transfer comment: deferred- fatigue  Ambulation/Gait             General Gait Details: deferred- fatigue   Stairs             Wheelchair Mobility    Modified Rankin (Stroke Patients Only)       Balance Overall balance assessment: Needs assistance Sitting-balance support: Feet supported Sitting balance-Leahy Scale: Fair Sitting balance - Comments: min guard to maintain midline with intermittent ModA when he leaned back due to fatigue Postural control: Posterior lean                                  Cognition Arousal/Alertness: Awake/alert Behavior During Therapy: Flat affect Overall Cognitive Status: Impaired/Different from baseline Area of Impairment: Orientation;Attention;Memory;Following commands;Safety/judgement;Awareness;Problem solving                 Orientation Level: Disoriented to;Place;Time;Situation Current Attention Level: Focused Memory: Decreased recall of precautions;Decreased short-term memory Following Commands: Follows one step commands inconsistently;Follows one step commands with increased time Safety/Judgement: Decreased awareness of safety;Decreased awareness of deficits Awareness: Intellectual Problem Solving: Decreased initiation;Requires verbal cues;Requires tactile cues;Slow processing;Difficulty sequencing General Comments: more awake today, seems to have significant attention deficits and loses focus within seconds along with delayed processing and reduced awareness of impairments. Was a little more verbal today but  needed a lot of extended time.      Exercises      General Comments        Pertinent Vitals/Pain Pain Assessment: Faces Pain Score: 0-No pain Faces Pain Scale: No hurt Pain Intervention(s):  Limited activity within patient's tolerance;Monitored during session    Home Living                      Prior Function            PT Goals (current goals can now be found in the care plan section) Acute Rehab PT Goals Patient Stated Goal: none PT Goal Formulation: Patient unable to participate in goal setting Time For Goal Achievement: 09/10/19 Potential to Achieve Goals: Good Progress towards PT goals: Progressing toward goals    Frequency    Min 3X/week      PT Plan Current plan remains appropriate    Co-evaluation PT/OT/SLP Co-Evaluation/Treatment: Yes Reason for Co-Treatment: Complexity of the patient's impairments (multi-system involvement);Necessary to address cognition/behavior during functional activity PT goals addressed during session: Mobility/safety with mobility;Balance        AM-PAC PT "6 Clicks" Mobility   Outcome Measure  Help needed turning from your back to your side while in a flat bed without using bedrails?: A Lot Help needed moving from lying on your back to sitting on the side of a flat bed without using bedrails?: A Lot Help needed moving to and from a bed to a chair (including a wheelchair)?: Total Help needed standing up from a chair using your arms (e.g., wheelchair or bedside chair)?: Total Help needed to walk in hospital room?: Total Help needed climbing 3-5 steps with a railing? : Total 6 Click Score: 8    End of Session   Activity Tolerance: Patient limited by fatigue;Patient tolerated treatment well Patient left: in bed;with call bell/phone within reach;with bed alarm set   PT Visit Diagnosis: Other abnormalities of gait and mobility (R26.89);Other symptoms and signs involving the nervous system (R29.898);History of falling (Z91.81);Repeated falls (R29.6)     Time: 4098-1191 PT Time Calculation (min) (ACUTE ONLY): 27 min  Charges:  $Therapeutic Activity: 8-22 mins                     [name], DPT, PN1    Supplemental Physical Therapist [name]    Pager [phone] Acute Rehab Office [phone]

## 2019-09-03 NOTE — Plan of Care (Signed)

## 2019-09-03 NOTE — Progress Notes (Signed)
SLP Cancellation Note  Patient Details Name: [name] MRN: [id number] DOB: 11/06/1955   Cancelled treatment:       Reason Eval/Treat Not Completed: Patient at procedure or test/unavailable. Pt working with OT. Will f/u as able.    Luberta Grabinski, Katherene Ponto 09/03/2019, 1:56 PM

## 2019-09-03 NOTE — Progress Notes (Signed)
PROGRESS NOTE    [name]  [id number] DOB: 12-10-1955 DOA: 08/26/2019 PCP: Alroy Dust, L.[name], MD     Brief Narrative:  [name] is a 63 y.o. year old male with medical history significant for hypertension and previous stroke with some residual deficit who presented on 08/26/2019 with worsening confusion and several falls at home x1 day and was found to have hypertensive emergency with large occipital stroke. In ED, initially noted to be hypertensive with blood pressure 230/125 and creatinine of 1.3.  Upon evaluation in the ED patient was intermittently following commands with noticeable facial droop and slurred speech per family consistent with his baseline of previous stroke.  CT head initially revealed age-indeterminate infarct.  Given persistently high blood pressure and intermittent confusion, neurology was consulted who recommended permissive hypertension and ordered MRI which showed a large occipital lobe stroke. Hospital course complicated by continued confusion. CT head done and showed no acute change from prior stroke.   New events last 24 hours / Subjective: No new events overnight.   Assessment & Plan:   Principal Problem:   Acute ischemic right PCA stroke (HCC) Active Problems:   HTN (hypertension)   Tobacco abuse   Cocaine abuse ([place])   AKI (acute kidney injury) ([place])   Hyperlipidemia   Confusion   Hypokalemia    Large PCA territory infarct on the right, stable -History of previous stroke.  Found to be cocaine positive.  Likely in the setting of hypertensive emergency and substance abuse, likely thrombotic could also be embolic.  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  Preserved EF on TTE without PFO.  A1c 6.2, LDL 107, UDS positive for cocaine -Neurology f/u with [facility] in 4 weeks -Cardiology will arrange 30 day cardiac event monitoring for Afib rule out -Aspirin 325 mg p.o.,  plavix 75 mg qd for DAPT x 3 months, then plavix alone per  neurology  Acute metabolic encephalopathy -Will likely be prone to bouts of confusion given large PCA stroke  -Repeat CT head shows no evolution or worsening of known disease after fall in ED on admission -Stable   Essential HTN  -Allow permissive hypertension, okay if less than 220/120 over the next 5 to 7 days post acute CVA -Lisinopril, HCTZ, labetalol as needed  Substance abuse. 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 -Incidentally found on imaging for stroke work-up -Histologic sampling should be considered due to degree of overlap of imaging features between benign and malignant parotid tumors by ENT  AKI vs CKD stage 3b  -Last creatinine from 2014 was 1.14 -1.32 on admission, has ranged from 1.3-1.6 this admission  -Stable overall   DVT prophylaxis: SCD Code Status: Full Family Communication: None at bedside Disposition Plan: SNF placement pending   Consultants:   Neurology  Procedures:   None   Antimicrobials:  Anti-infectives (From admission, onward)   None       Objective: Vitals:   09/02/19 0851 09/02/19 1616 09/02/19 2259 09/03/19 0917  BP: 140/90 129/88 130/78 (!) 158/103  Pulse: 95 92 78 93  Resp: 12 16 17    Temp: 97.7 F (36.5 C) 97.8 F (36.6 C) 98 F (36.7 C) 98.2 F (36.8 C)  TempSrc: Axillary Axillary Oral Axillary  SpO2: 98% 99% 98% 99%    Intake/Output Summary (Last 24 hours) at 09/03/2019 1002 Last data filed at 09/03/2019 0601 Gross per 24 hour  Intake 100 ml  Output 350 ml  Net -250 ml   There were no vitals filed for this visit.   Examination: General exam: Appears calm and comfortable  Respiratory system: Clear to auscultation. Respiratory effort normal. Cardiovascular system: S1 & S2 heard, RRR. No pedal edema. Gastrointestinal system: Abdomen is nondistended, soft and nontender. Normal  bowel sounds heard. Central nervous system: Alert Extremities: Symmetric in appearance bilaterally  Skin: No rashes, lesions or ulcers on exposed skin  Psychiatry: Stable    Data Reviewed: I have personally reviewed following labs and imaging studies  CBC: No results for input(s): WBC, NEUTROABS, HGB, HCT, MCV, PLT in the last 168 hours. Basic Metabolic Panel: Recent Labs  Lab 08/28/19 0237 08/29/19 0349 08/30/19 1603 08/31/19 0909 09/01/19 0149 09/02/19 0309  NA 136 139 139  --  140 141  K 3.2* 3.0* 3.0*  --  4.2 3.6  CL 99 101 103  --  104 104  CO2 22 26 25   --  23 23  GLUCOSE 106* 124* 119*  --  120* 118*  BUN 6* 11 13  --  24* 31*  CREATININE 1.33* 1.53* 1.29*  --  1.60* 1.48*  CALCIUM 9.2 9.1 9.2  --  9.0 9.3  MG  --  2.0  --  1.9  --   --    GFR: CrCl cannot be calculated (Unknown ideal weight.). Liver Function Tests: No results for input(s): AST, ALT, ALKPHOS, BILITOT, PROT, ALBUMIN in the last 168 hours. No results for input(s): LIPASE, AMYLASE in the last 168 hours. No results for input(s): AMMONIA in the last 168 hours. Coagulation Profile: No results for input(s): INR, PROTIME in the last 168 hours. Cardiac Enzymes: No results for input(s): CKTOTAL, CKMB, CKMBINDEX, TROPONINI in the last 168 hours. BNP (last 3 results) No results for input(s): PROBNP in the last 8760 hours. HbA1C: No results for input(s): HGBA1C in the last 72 hours. CBG: No results for input(s): GLUCAP in the last 168 hours. Lipid Profile: No results for input(s): CHOL, HDL, LDLCALC, TRIG, CHOLHDL, LDLDIRECT in the last 72 hours. Thyroid Function Tests: No results for input(s): TSH, T4TOTAL, FREET4, T3FREE, THYROIDAB in the last 72 hours. Anemia Panel: No results for input(s): VITAMINB12, FOLATE, FERRITIN, TIBC, IRON, RETICCTPCT in the last 72 hours. Sepsis Labs: No results for input(s): PROCALCITON, LATICACIDVEN in the last 168 hours.  Recent Results (from the past 240 hour(s))    SARS CORONAVIRUS 2 (TAT 6-24 HRS) Nasopharyngeal Nasopharyngeal Swab     Status: None   Collection Time: 08/26/19  9:54 PM   Specimen: Nasopharyngeal Swab  Result Value Ref Range Status   SARS Coronavirus 2 NEGATIVE NEGATIVE Final    Comment: (NOTE) SARS-CoV-2 target nucleic acids are NOT DETECTED. The SARS-CoV-2 RNA is generally detectable in upper and lower respiratory specimens during the acute phase of infection. Negative results do not preclude SARS-CoV-2 infection, do not rule out co-infections with other pathogens, and should not be used as the sole basis for treatment or other patient management decisions. Negative results must be combined with clinical observations, patient history, and epidemiological information. The expected result is Negative. Fact Sheet for Patients: SugarRoll.be Fact Sheet for Healthcare Providers: https://www.woods-mathews.com/ This test is not yet approved or cleared by the [place] FDA and  has been authorized for detection and/or diagnosis of SARS-CoV-2 by FDA under an Emergency Use Authorization (EUA). This EUA will remain  in effect (meaning this test can be used) for the duration of the  COVID-19 declaration under Section 56 4(b)(1) of the Act, 21 U.S.C. section 360bbb-3(b)(1), unless the authorization is terminated or revoked sooner. Performed at [facility], [place] [address], [place], [name] [address]       Radiology Studies: No results found.    Scheduled Meds: .  stroke: mapping our early stages of recovery book   Does not apply Once  . aspirin  300 mg Rectal Daily   Or  . aspirin  325 mg Oral Daily  . atorvastatin  80 mg Oral q1800  . clopidogrel  75 mg Oral Daily  . hydrochlorothiazide  25 mg Oral Daily  . lisinopril  10 mg Oral Daily   Continuous Infusions:   LOS: 7 days      Time spent: 25 minutes   Dessa Phi, DO [facility] 09/03/2019, 10:02 AM    Available via Epic secure chat 7am-7pm After these hours, please refer to coverage provider listed on [email or web address]

## 2019-09-03 NOTE — Progress Notes (Signed)
Occupational Therapy Treatment Patient Details Name: [name] MRN: [id number] DOB: Aug 21, 1956 Today's Date: 09/03/2019    History of present illness [name] is a 63 y.o. year old male with medical history significant for hypertension and previous stroke with some residual deficit who presented on 08/26/2019 with worsening confusion and several falls at home x1 day and was found to have hypertensive emergency with large occipital stroke.   OT comments  Pt assisted to EOB with +2 assist. Sat with min guard to moderate assistance x 10 minutes while engaged in self feeding. Pt very slow response speed vs poor attention throughout session. Verbalizing at times when prompted. Progressing slowly.  Follow Up Recommendations  SNF;Supervision/Assistance - 24 hour    Equipment Recommendations  Other (comment)(defer to next venue)    Recommendations for Other Services      Precautions / Restrictions Precautions Precautions: Fall Precaution Comments: fell in ED Restrictions Weight Bearing Restrictions: No       Mobility Bed Mobility Overal bed mobility: Needs Assistance Bed Mobility: Supine to Sit;Sit to Supine     Supine to sit: Max assist;+2 for physical assistance Sit to supine: +2 for physical assistance;Max assist   General bed mobility comments: MaxAx2 for bed mobility, once up initially required ModA which faded to min guard to maintain midline sitting at EOB; did sometimes lean back onto therapist behind him for short periods of time possibly due to fatigue but able to use core muscles to bring hipself back upright  Transfers                 General transfer comment: deferred- fatigue    Balance Overall balance assessment: Needs assistance Sitting-balance support: Feet supported Sitting balance-Leahy Scale: Poor Sitting balance - Comments: min guard to maintain midline with intermittent ModA when he leaned back due to fatigue Postural control: Posterior  lean                                 ADL either performed or assessed with clinical judgement   ADL Overall ADL's : Needs assistance/impaired Eating/Feeding: Moderate assistance;Sitting Eating/Feeding Details (indicate cue type and reason): able to bring loaded spoon to his mouth, pushed OT's hand away to indicate he did not want more, with cues verbalized he was finished                                         Vision   Additional Comments: reported he could see PT standing in L hemispace using peripheral vision, turned head to look out window on L and toward OT seated beside him on bed to L   Perception     Praxis      Cognition Arousal/Alertness: Awake/alert Behavior During Therapy: Flat affect Overall Cognitive Status: Impaired/Different from baseline Area of Impairment: Orientation;Attention;Memory;Following commands;Safety/judgement;Awareness;Problem solving                 Orientation Level: Disoriented to;Place;Time;Situation Current Attention Level: Focused Memory: Decreased recall of precautions;Decreased short-term memory Following Commands: Follows one step commands inconsistently;Follows one step commands with increased time Safety/Judgement: Decreased awareness of safety;Decreased awareness of deficits Awareness: Intellectual Problem Solving: Decreased initiation;Requires verbal cues;Requires tactile cues;Slow processing;Difficulty sequencing General Comments: more awake today, seems to have significant attention deficits and loses focus within seconds along with delayed processing and reduced awareness of impairments.  Was a little more verbal today but needed a lot of extended time.        Exercises     Shoulder Instructions       General Comments      Pertinent Vitals/ Pain       Pain Assessment: Faces Pain Score: 0-No pain Faces Pain Scale: No hurt Pain Intervention(s): Limited activity within patient's  tolerance;Monitored during session  Home Living                                          Prior Functioning/Environment              Frequency  Min 2X/week        Progress Toward Goals  OT Goals(current goals can now be found in the care plan section)  Progress towards OT goals: Progressing toward goals  Acute Rehab OT Goals Patient Stated Goal: none OT Goal Formulation: Patient unable to participate in goal setting Time For Goal Achievement: 09/13/19 Potential to Achieve Goals: [place] Discharge plan remains appropriate    Co-evaluation    PT/OT/SLP Co-Evaluation/Treatment: Yes Reason for Co-Treatment: For patient/therapist safety PT goals addressed during session: Mobility/safety with mobility;Balance OT goals addressed during session: ADL's and self-care      AM-PAC OT "6 Clicks" Daily Activity     Outcome Measure   Help from another person eating meals?: A Lot Help from another person taking care of personal grooming?: A Lot Help from another person toileting, which includes using toliet, bedpan, or urinal?: Total Help from another person bathing (including washing, rinsing, drying)?: Total Help from another person to put on and taking off regular upper body clothing?: Total Help from another person to put on and taking off regular lower body clothing?: Total 6 Click Score: 8    End of Session    OT Visit Diagnosis: Hemiplegia and hemiparesis;Other symptoms and signs involving the nervous system (R29.898) Hemiplegia - Right/Left: Left Hemiplegia - dominant/non-dominant: Non-Dominant Hemiplegia - caused by: Cerebral infarction   Activity Tolerance Patient tolerated treatment well   Patient Left in bed;with call bell/phone within reach;with bed alarm set   Nurse Communication          Time: [id number] OT Time Calculation (min): 27 min  Charges: OT General Charges $OT Visit: 1 Visit OT Treatments $Self Care/Home Management  : 8-22 mins  Nestor Lewandowsky, OTR/L Acute Rehabilitation Services Pager: [phone] Office: [phone]   Malka So 09/03/2019, 4:01 PM

## 2019-09-03 NOTE — TOC Progression Note (Signed)
Transition of Care [facility]) - Progression Note    Patient Details  Name: [name] MRN: [id number] Date of Birth: [birth year]  Transition of [facility]) CM/SW Contact  Sharin Mons, RN Phone Number: [phone] 09/03/2019, 7:10 AM  Clinical Narrative:    Still no SNF beds offers...Marland KitchenMarland Kitchenexpanding search. TOC will continue to monitor.   Expected Discharge Plan: Skilled Nursing Facility Barriers to Discharge: SNF Pending bed offer  Expected Discharge Plan and Services Expected Discharge Plan: [place]   Discharge Planning Services: CM Consult   Living arrangements for the past 2 months: Single Family Home                                       Social Determinants of Health (SDOH) Interventions    Readmission Risk Interventions No flowsheet data found.

## 2019-09-04 ENCOUNTER — Encounter (HOSPITAL_COMMUNITY): Payer: Self-pay | Admitting: Internal Medicine

## 2019-09-04 ENCOUNTER — Other Ambulatory Visit: Payer: Self-pay

## 2019-09-04 MED ORDER — INFLUENZA VAC SPLIT QUAD 0.5 ML IM SUSY
0.5000 mL | PREFILLED_SYRINGE | INTRAMUSCULAR | Status: AC
  Administered 2019-09-05: 0.5 mL via INTRAMUSCULAR
  Filled 2019-09-04: qty 0.5

## 2019-09-04 NOTE — Progress Notes (Signed)
PROGRESS NOTE    [name]  [id number] DOB: [birth year] DOA: 08/26/2019 PCP: Alroy Dust, L.[name], MD     Brief Narrative:  [name] is a 63 y.o. year old male with medical history significant for hypertension and previous stroke with some residual deficit who presented on 08/26/2019 with worsening confusion and several falls at home x1 day and was found to have hypertensive emergency with large occipital stroke. In ED, initially noted to be hypertensive with blood pressure 230/125 and creatinine of 1.3.  Upon evaluation in the ED patient was intermittently following commands with noticeable facial droop and slurred speech per family consistent with his baseline of previous stroke.  CT head initially revealed age-indeterminate infarct.  Given persistently high blood pressure and intermittent confusion, neurology was consulted who recommended permissive hypertension and ordered MRI which showed a large occipital lobe stroke. Hospital course complicated by continued confusion. CT head done and showed no acute change from prior stroke.   New events last 24 hours / Subjective: No acute events.  Continues to wait for SNF placement  Assessment & Plan:   Principal Problem:   Acute ischemic right PCA stroke ([place]) Active Problems:   HTN (hypertension)   Tobacco abuse   Cocaine abuse ([place])   AKI (acute kidney injury) ([place])   Hyperlipidemia   Confusion   Hypokalemia    Large PCA territory infarct on the right, stable -History of previous stroke.  Found to be cocaine positive.  Likely in the setting of hypertensive emergency and substance abuse, likely thrombotic could also be embolic.  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  Preserved EF on TTE without PFO.  A1c 6.2, LDL 107, UDS positive for cocaine -Neurology f/u with [facility] in 4 weeks -Cardiology will arrange 30 day cardiac event monitoring for Afib rule out -Aspirin 325 mg p.o.,  plavix 75 mg qd for DAPT x 3 months,  then plavix alone per neurology  Acute metabolic encephalopathy -Will likely be prone to bouts of confusion given large PCA stroke  -Repeat CT head shows no evolution or worsening of known disease after fall in ED on admission -Stable   Essential HTN  -Allow permissive hypertension, okay if less than 220/120 over the next 5 to 7 days post acute CVA -Lisinopril, HCTZ, labetalol as needed  Substance abuse. 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 -Incidentally found on imaging for stroke work-up -Histologic sampling should be considered due to degree of overlap of imaging features between benign and malignant parotid tumors by ENT  AKI vs CKD stage 3b  -Last creatinine from 2014 was 1.14 -1.32 on admission, has ranged from 1.3-1.6 this admission  -Stable overall   DVT prophylaxis: SCD Code Status: Full Family Communication: None at bedside Disposition Plan: SNF placement pending   Consultants:   Neurology  Procedures:   None   Antimicrobials:  Anti-infectives (From admission, onward)   None       Objective: Vitals:   09/03/19 1604 09/03/19 2316 09/04/19 0733 09/04/19 0847  BP: (!) 139/105 (!) 131/109 (!) 139/103   Pulse: (!) 101 100 93   Resp:  14 16   Temp: 97.7 F (36.5 C) 98 F (36.7 C) (!) 97.5 F (36.4 C)   TempSrc: Axillary  Oral   SpO2: 100% 94% 99%   Weight:    85 kg  Height:    5\' 9"  (1.753  m)    Intake/Output Summary (Last 24 hours) at 09/04/2019 0942 Last data filed at 09/04/2019 0733 Gross per 24 hour  Intake 0 ml  Output 800 ml  Net -800 ml   Filed Weights   09/04/19 0847  Weight: 85 kg     Examination: General exam: Appears calm and comfortable  Central nervous system: Alert   Data Reviewed: I have personally reviewed following labs and imaging studies  CBC: No results for input(s): WBC,  NEUTROABS, HGB, HCT, MCV, PLT in the last 168 hours. Basic Metabolic Panel: Recent Labs  Lab 08/29/19 0349 08/30/19 1603 08/31/19 0909 09/01/19 0149 09/02/19 0309  NA 139 139  --  140 141  K 3.0* 3.0*  --  4.2 3.6  CL 101 103  --  104 104  CO2 26 25  --  23 23  GLUCOSE 124* 119*  --  120* 118*  BUN 11 13  --  24* 31*  CREATININE 1.53* 1.29*  --  1.60* 1.48*  CALCIUM 9.1 9.2  --  9.0 9.3  MG 2.0  --  1.9  --   --    GFR: Estimated Creatinine Clearance: 55.9 mL/min (A) (by C-G formula based on SCr of 1.48 mg/dL (H)). Liver Function Tests: No results for input(s): AST, ALT, ALKPHOS, BILITOT, PROT, ALBUMIN in the last 168 hours. No results for input(s): LIPASE, AMYLASE in the last 168 hours. No results for input(s): AMMONIA in the last 168 hours. Coagulation Profile: No results for input(s): INR, PROTIME in the last 168 hours. Cardiac Enzymes: No results for input(s): CKTOTAL, CKMB, CKMBINDEX, TROPONINI in the last 168 hours. BNP (last 3 results) No results for input(s): PROBNP in the last 8760 hours. HbA1C: No results for input(s): HGBA1C in the last 72 hours. CBG: No results for input(s): GLUCAP in the last 168 hours. Lipid Profile: No results for input(s): CHOL, HDL, LDLCALC, TRIG, CHOLHDL, LDLDIRECT in the last 72 hours. Thyroid Function Tests: No results for input(s): TSH, T4TOTAL, FREET4, T3FREE, THYROIDAB in the last 72 hours. Anemia Panel: No results for input(s): VITAMINB12, FOLATE, FERRITIN, TIBC, IRON, RETICCTPCT in the last 72 hours. Sepsis Labs: No results for input(s): PROCALCITON, LATICACIDVEN in the last 168 hours.  Recent Results (from the past 240 hour(s))  SARS CORONAVIRUS 2 (TAT 6-24 HRS) Nasopharyngeal Nasopharyngeal Swab     Status: None   Collection Time: 08/26/19  9:54 PM   Specimen: Nasopharyngeal Swab  Result Value Ref Range Status   SARS Coronavirus 2 NEGATIVE NEGATIVE Final    Comment: (NOTE) SARS-CoV-2 target nucleic acids are NOT  DETECTED. The SARS-CoV-2 RNA is generally detectable in upper and lower respiratory specimens during the acute phase of infection. Negative results do not preclude SARS-CoV-2 infection, do not rule out co-infections with other pathogens, and should not be used as the sole basis for treatment or other patient management decisions. Negative results must be combined with clinical observations, patient history, and epidemiological information. The expected result is Negative. Fact Sheet for Patients: SugarRoll.be Fact Sheet for Healthcare Providers: https://www.woods-mathews.com/ This test is not yet approved or cleared by the [place] FDA and  has been authorized for detection and/or diagnosis of SARS-CoV-2 by FDA under an Emergency Use Authorization (EUA). This EUA will remain  in effect (meaning this test can be used) for the duration of the COVID-19 declaration under Section 56 4(b)(1) of the Act, 21 U.S.C. section 360bbb-3(b)(1), unless the authorization is terminated or revoked sooner. Performed at [facility],  [address], [place], [place] [address]       Radiology Studies: No results found.    Scheduled Meds:   stroke: mapping our early stages of recovery book   Does not apply Once   aspirin  300 mg Rectal Daily   Or   aspirin  325 mg Oral Daily   atorvastatin  80 mg Oral q1800   clopidogrel  75 mg Oral Daily   hydrochlorothiazide  25 mg Oral Daily   [START ON 09/05/2019] influenza vac split quadrivalent PF  0.5 mL Intramuscular Tomorrow-1000   lisinopril  10 mg Oral Daily   Continuous Infusions:   LOS: 8 days      Time spent: 22minutes   Dessa Phi, DO [facility] 09/04/2019, 9:42 AM   Available via Epic secure chat 7am-7pm After these hours, please refer to coverage provider listed on [email or web address]

## 2019-09-05 NOTE — Progress Notes (Signed)
PROGRESS NOTE    [name]  [id number] DOB: 04-24-1956 DOA: 08/26/2019 PCP: Alroy Dust, L.[name], MD     Brief Narrative:  [name] is a 63 y.o. year old male with medical history significant for hypertension and previous stroke with some residual deficit who presented on 08/26/2019 with worsening confusion and several falls at home x1 day and was found to have hypertensive emergency with large occipital stroke. In ED, initially noted to be hypertensive with blood pressure 230/125 and creatinine of 1.3.  Upon evaluation in the ED patient was intermittently following commands with noticeable facial droop and slurred speech per family consistent with his baseline of previous stroke.  CT head initially revealed age-indeterminate infarct.  Given persistently high blood pressure and intermittent confusion, neurology was consulted who recommended permissive hypertension and ordered MRI which showed a large occipital lobe stroke. Hospital course complicated by continued confusion. CT head done and showed no acute change from prior stroke.   New events last 24 hours / Subjective: No change overnight.   Assessment & Plan:   Principal Problem:   Acute ischemic right PCA stroke ([facility]) Active Problems:   HTN (hypertension)   Tobacco abuse   Cocaine abuse ([place])   AKI (acute kidney injury) ([place])   Hyperlipidemia   Confusion   Hypokalemia    Large PCA territory infarct on the right, stable -History of previous stroke.  Found to be cocaine positive.  Likely in the setting of hypertensive emergency and substance abuse, likely thrombotic could also be embolic.  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  Preserved EF on TTE without PFO.  A1c 6.2, LDL 107, UDS positive for cocaine -Neurology f/u with [facility] in 4 weeks -Cardiology will arrange 30 day cardiac event monitoring for Afib rule out -Aspirin 325 mg p.o.,  plavix 75 mg qd for DAPT x 3 months, then plavix alone per  neurology  Acute metabolic encephalopathy -Will likely be prone to bouts of confusion given large PCA stroke  -Repeat CT head shows no evolution or worsening of known disease after fall in ED on admission -Stable   Essential HTN  -Allow permissive hypertension, okay if less than 220/120 over the next 5 to 7 days post acute CVA -Lisinopril, HCTZ, labetalol as needed  Substance abuse. 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 -Incidentally found on imaging for stroke work-up -Histologic sampling should be considered due to degree of overlap of imaging features between benign and malignant parotid tumors by ENT  AKI vs CKD stage 3b  -Last creatinine from 2014 was 1.14 -1.32 on admission, has ranged from 1.3-1.6 this admission  -Stable overall   DVT prophylaxis: SCD Code Status: Full Family Communication: None at bedside Disposition Plan: SNF placement pending   Consultants:   Neurology  Procedures:   None   Antimicrobials:  Anti-infectives (From admission, onward)   None       Objective: Vitals:   09/04/19 0847 09/04/19 1615 09/04/19 2300 09/05/19 0743  BP:  (!) 147/101 (!) 145/98 (!) 141/108  Pulse:  100 89 (!) 105  Resp:  16 18 17   Temp:  (!) 96.9 F (36.1 C) (!) 97.2 F (36.2 C)   TempSrc:  Axillary Oral   SpO2:  98% 98% 99%  Weight: 85 kg     Height: 5\' 9"  (1.753 m)       Intake/Output Summary (Last 24  hours) at 09/05/2019 0917 Last data filed at 09/05/2019 0743 Gross per 24 hour  Intake 0 ml  Output 650 ml  Net -650 ml   Filed Weights   09/04/19 0847  Weight: 85 kg     Examination: General exam: Appears calm and comfortable  Central nervous system: Alert   Data Reviewed: I have personally reviewed following labs and imaging studies  CBC: No results for input(s): WBC, NEUTROABS, HGB, HCT, MCV, PLT in the last  168 hours. Basic Metabolic Panel: Recent Labs  Lab 08/30/19 1603 08/31/19 0909 09/01/19 0149 09/02/19 0309  NA 139  --  140 141  K 3.0*  --  4.2 3.6  CL 103  --  104 104  CO2 25  --  23 23  GLUCOSE 119*  --  120* 118*  BUN 13  --  24* 31*  CREATININE 1.29*  --  1.60* 1.48*  CALCIUM 9.2  --  9.0 9.3  MG  --  1.9  --   --    GFR: Estimated Creatinine Clearance: 55.9 mL/min (A) (by C-G formula based on SCr of 1.48 mg/dL (H)). Liver Function Tests: No results for input(s): AST, ALT, ALKPHOS, BILITOT, PROT, ALBUMIN in the last 168 hours. No results for input(s): LIPASE, AMYLASE in the last 168 hours. No results for input(s): AMMONIA in the last 168 hours. Coagulation Profile: No results for input(s): INR, PROTIME in the last 168 hours. Cardiac Enzymes: No results for input(s): CKTOTAL, CKMB, CKMBINDEX, TROPONINI in the last 168 hours. BNP (last 3 results) No results for input(s): PROBNP in the last 8760 hours. HbA1C: No results for input(s): HGBA1C in the last 72 hours. CBG: No results for input(s): GLUCAP in the last 168 hours. Lipid Profile: No results for input(s): CHOL, HDL, LDLCALC, TRIG, CHOLHDL, LDLDIRECT in the last 72 hours. Thyroid Function Tests: No results for input(s): TSH, T4TOTAL, FREET4, T3FREE, THYROIDAB in the last 72 hours. Anemia Panel: No results for input(s): VITAMINB12, FOLATE, FERRITIN, TIBC, IRON, RETICCTPCT in the last 72 hours. Sepsis Labs: No results for input(s): PROCALCITON, LATICACIDVEN in the last 168 hours.  Recent Results (from the past 240 hour(s))  SARS CORONAVIRUS 2 (TAT 6-24 HRS) Nasopharyngeal Nasopharyngeal Swab     Status: None   Collection Time: 08/26/19  9:54 PM   Specimen: Nasopharyngeal Swab  Result Value Ref Range Status   SARS Coronavirus 2 NEGATIVE NEGATIVE Final    Comment: (NOTE) SARS-CoV-2 target nucleic acids are NOT DETECTED. The SARS-CoV-2 RNA is generally detectable in upper and lower respiratory specimens during  the acute phase of infection. Negative results do not preclude SARS-CoV-2 infection, do not rule out co-infections with other pathogens, and should not be used as the sole basis for treatment or other patient management decisions. Negative results must be combined with clinical observations, patient history, and epidemiological information. The expected result is Negative. Fact Sheet for Patients: SugarRoll.be Fact Sheet for Healthcare Providers: https://www.woods-mathews.com/ This test is not yet approved or cleared by the [place] FDA and  has been authorized for detection and/or diagnosis of SARS-CoV-2 by FDA under an Emergency Use Authorization (EUA). This EUA will remain  in effect (meaning this test can be used) for the duration of the COVID-19 declaration under Section 56 4(b)(1) of the Act, 21 U.S.C. section 360bbb-3(b)(1), unless the authorization is terminated or revoked sooner. Performed at [facility], [place] [address], [place], [place] [address]       Radiology Studies: No results found.  Scheduled Meds: .  stroke: mapping our early stages of recovery book   Does not apply Once  . aspirin  300 mg Rectal Daily   Or  . aspirin  325 mg Oral Daily  . atorvastatin  80 mg Oral q1800  . clopidogrel  75 mg Oral Daily  . hydrochlorothiazide  25 mg Oral Daily  . influenza vac split quadrivalent PF  0.5 mL Intramuscular Tomorrow-1000  . lisinopril  10 mg Oral Daily   Continuous Infusions:   LOS: 9 days      Time spent: 37minutes   Dessa Phi, DO [facility] 09/05/2019, 9:17 AM   Available via Epic secure chat 7am-7pm After these hours, please refer to coverage provider listed on [email or web address]

## 2019-09-06 LAB — BASIC METABOLIC PANEL
Anion gap: 15 (ref 5–15)
BUN: 56 mg/dL — ABNORMAL HIGH (ref 8–23)
CO2: 28 mmol/L (ref 22–32)
Calcium: 9.6 mg/dL (ref 8.9–10.3)
Chloride: 105 mmol/L (ref 98–111)
Creatinine, Ser: 1.61 mg/dL — ABNORMAL HIGH (ref 0.61–1.24)
GFR calc Af Amer: 52 mL/min — ABNORMAL LOW (ref >60)
GFR calc non Af Amer: 45 mL/min — ABNORMAL LOW (ref >60)
Glucose, Bld: 136 mg/dL — ABNORMAL HIGH (ref 70–99)
Potassium: 3.9 mmol/L (ref 3.5–5.1)
Sodium: 148 mmol/L — ABNORMAL HIGH (ref 135–145)

## 2019-09-06 LAB — CBC
HCT: 58.3 % — ABNORMAL HIGH (ref 39.0–52.0)
Hemoglobin: 19.1 g/dL — ABNORMAL HIGH (ref 13.0–17.0)
MCH: 27.6 pg (ref 26.0–34.0)
MCHC: 32.8 g/dL (ref 30.0–36.0)
MCV: 84.1 fL (ref 80.0–100.0)
Platelets: 373 10*3/uL (ref 150–400)
RBC: 6.93 MIL/uL — ABNORMAL HIGH (ref 4.22–5.81)
RDW: 15.3 % (ref 11.5–15.5)
WBC: 21.3 10*3/uL — ABNORMAL HIGH (ref 4.0–10.5)
nRBC: 0 % (ref 0.0–0.2)

## 2019-09-06 MED ORDER — DEXTROSE-NACL 5-0.45 % IV SOLN: INTRAVENOUS | Status: DC

## 2019-09-06 MED ORDER — ENSURE ENLIVE PO LIQD
237.0000 mL | Freq: Three times a day (TID) | ORAL | Status: DC
  Administered 2019-09-06 – 2019-09-16 (×24): 237 mL via ORAL
  Filled 2019-09-06: qty 237

## 2019-09-06 NOTE — Progress Notes (Addendum)
PROGRESS NOTE    [name]  [id number] DOB: December 15, 1955 DOA: 08/26/2019 PCP: Alroy Dust, L.[name], MD     Brief Narrative:  [name] is a 63 y.o. year old male with medical history significant for hypertension and previous stroke with some residual deficit who presented on 08/26/2019 with worsening confusion and several falls at home x1 day and was found to have hypertensive emergency with large occipital stroke. In ED, initially noted to be hypertensive with blood pressure 230/125 and creatinine of 1.3.  Upon evaluation in the ED patient was intermittently following commands with noticeable facial droop and slurred speech per family consistent with his baseline of previous stroke.  CT head initially revealed age-indeterminate infarct.  Given persistently high blood pressure and intermittent confusion, neurology was consulted who recommended permissive hypertension and ordered MRI which showed a large occipital lobe stroke. Hospital course complicated by continued confusion. CT head done and showed no acute change from prior stroke.   New events last 24 hours / Subjective: Low grade fever 100.2, tachycardic, WBC 21.3, Na 148. Patient has had decreased PO intake. No complaints per patient today, although he remains very fatigued.   Assessment & Plan:   Principal Problem:   Acute ischemic right PCA stroke (HCC) Active Problems:   HTN (hypertension)   Tobacco abuse   Cocaine abuse ([place])   AKI (acute kidney injury) ([place])   Hyperlipidemia   Confusion   Hypokalemia    Large PCA territory infarct on the right, stable -History of previous stroke.  Found to be cocaine positive.  Likely in the setting of hypertensive emergency and substance abuse, likely thrombotic could also be embolic.  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  Preserved EF on TTE without PFO.  A1c 6.2, LDL 107, UDS positive for cocaine -Neurology f/u with [facility] in 4 weeks -Cardiology will arrange 30 day  cardiac event monitoring for Afib rule out -Aspirin 325 mg p.o.,  plavix 75 mg qd for DAPT x 3 months, then plavix alone per neurology  Hypernatremia -Likely secondary to decreased PO intake. Start D5-0.45NS IVF today and monitor BMP   Leukocytosis -Could be secondary to contracture (increase in Hgb and platelets as well) vs sepsis -Monitor CBC, obtain blood culture for T>100.4  -IVF   Acute metabolic encephalopathy -Will likely be prone to bouts of confusion given large PCA stroke  -Repeat CT head shows no evolution or worsening of known disease after fall in ED on admission -Stable   Essential HTN  -Allow permissive hypertension, okay if less than 220/120 over the next 5 to 7 days post acute CVA -Lisinopril, HCTZ, labetalol as needed  Substance abuse. 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 -Incidentally found on imaging for stroke work-up -Histologic sampling should be considered due to degree of overlap of imaging features between benign and malignant parotid tumors by ENT  AKI vs CKD stage 3b  -Last creatinine from 2014 was 1.14 -1.32 on admission, has ranged from 1.3-1.6 this admission  -Stable overall   DVT prophylaxis: SCD Code Status: Full Family Communication: None at bedside; spoke w wife over the phone  Disposition Plan: SNF placement pending   Consultants:   Neurology  Procedures:   None   Antimicrobials:  Anti-infectives (From admission, onward)   None       Objective: Vitals:   09/05/19 0743 09/05/19 1655 09/05/19 2300 09/06/19 0800  BP: (!) 141/108 (!) 157/104 (!) 162/100 (!) 160/101  Pulse: (!) 105 (!) 106 98 (!) 128  Resp: 17 16 17    Temp:   (!) 97.5 F (36.4 C) 100.2 F (37.9 C)  TempSrc:   Oral Axillary  SpO2: 99% 98% 98% 96%  Weight:      Height:        Intake/Output Summary (Last 24 hours) at  09/06/2019 0907 Last data filed at 09/06/2019 0800 Gross per 24 hour  Intake 8.56 ml  Output --  Net 8.56 ml   Filed Weights   09/04/19 0847  Weight: 85 kg     Examination: General exam: Appears calm and comfortable  Respiratory system: Clear to auscultation. Respiratory effort normal. Cardiovascular system: S1 & S2 heard, tachycardic, regular rhythm  Gastrointestinal system: Abdomen is nondistended, soft and nontender. Normal bowel sounds heard. Central nervous system: Alert  Extremities: Symmetric in appearance bilaterally  Skin: No rashes, lesions or ulcers on exposed skin    Data Reviewed: I have personally reviewed following labs and imaging studies  CBC: Recent Labs  Lab 09/06/19 0235  WBC 21.3*  HGB 19.1*  HCT 58.3*  MCV 84.1  PLT [id number]   Basic Metabolic Panel: Recent Labs  Lab 08/30/19 1603 08/31/19 0909 09/01/19 0149 09/02/19 0309 09/06/19 0235  NA 139  --  140 141 148*  K 3.0*  --  4.2 3.6 3.9  CL 103  --  104 104 105  CO2 25  --  23 23 28   GLUCOSE 119*  --  120* 118* 136*  BUN 13  --  24* 31* 56*  CREATININE 1.29*  --  1.60* 1.48* 1.61*  CALCIUM 9.2  --  9.0 9.3 9.6  MG  --  1.9  --   --   --    GFR: Estimated Creatinine Clearance: 51.4 mL/min (A) (by C-G formula based on SCr of 1.61 mg/dL (H)). Liver Function Tests: No results for input(s): AST, ALT, ALKPHOS, BILITOT, PROT, ALBUMIN in the last 168 hours. No results for input(s): LIPASE, AMYLASE in the last 168 hours. No results for input(s): AMMONIA in the last 168 hours. Coagulation Profile: No results for input(s): INR, PROTIME in the last 168 hours. Cardiac Enzymes: No results for input(s): CKTOTAL, CKMB, CKMBINDEX, TROPONINI in the last 168 hours. BNP (last 3 results) No results for input(s): PROBNP in the last 8760 hours. HbA1C: No results for input(s): HGBA1C in the last 72 hours. CBG: No results for input(s): GLUCAP in the last 168 hours. Lipid Profile: No results for input(s):  CHOL, HDL, LDLCALC, TRIG, CHOLHDL, LDLDIRECT in the last 72 hours. Thyroid Function Tests: No results for input(s): TSH, T4TOTAL, FREET4, T3FREE, THYROIDAB in the last 72 hours. Anemia Panel: No results for input(s): VITAMINB12, FOLATE, FERRITIN, TIBC, IRON, RETICCTPCT in the last 72 hours. Sepsis Labs: No results for input(s): PROCALCITON, LATICACIDVEN in the last 168 hours.  No results found for this or any previous visit (from the past 240 hour(s)).    Radiology Studies: No results found.    Scheduled Meds: .  stroke: mapping our early stages of recovery book   Does not apply Once  . aspirin  300 mg Rectal Daily   Or  . aspirin  325 mg Oral Daily  . atorvastatin  80 mg Oral q1800  . clopidogrel  75 mg Oral Daily  . hydrochlorothiazide  25 mg Oral Daily  . lisinopril  10 mg Oral Daily   Continuous Infusions: . dextrose 5 %  and 0.45% NaCl 75 mL/hr at 09/06/19 0753     LOS: 10 days      Time spent: 25 minutes   Dessa Phi, DO [facility] 09/06/2019, 9:07 AM   Available via Epic secure chat 7am-7pm After these hours, please refer to coverage provider listed on [email or web address]

## 2019-09-06 NOTE — Progress Notes (Signed)
   Vital Signs MEWS/VS Documentation      09/05/2019 1930 09/05/2019 2300 09/06/2019 0700 09/06/2019 0800   MEWS Score:  1  0  3  2   MEWS Score Color:  Green  Green  Yellow  Yellow   Resp:  --  17  --  --   Pulse:  --  98  --  (!) 128   BP:  --  (!) 162/100  --  (!) 160/101   Temp:  --  (!) 97.5 F (36.4 C)  --  100.2 F (37.9 C)   O2 Device:  --  --  --  Room Air   Level of Consciousness:  Alert  --  --  Alert      Discussed MEWS score with Dr. Maylene Roes. IVF have been initiated and PRN Tylenol given for fever. Will continue to monitor.      [facility] 09/06/2019,9:06 AM

## 2019-09-06 NOTE — Progress Notes (Signed)
Physical Therapy Treatment Patient Details Name: [name] MRN: [id number] DOB: 1956-07-06 Today's Date: 09/06/2019    History of Present Illness [name] is a 63 y.o. year old male with medical history significant for hypertension and previous stroke with some residual deficit who presented on 08/26/2019 with worsening confusion and several falls at home x1 day and was found to have hypertensive emergency with large occipital stroke.    PT Comments    Pt was seen for mobility with some trunk ext when PT sat him up on side of bed.  However, did do AAROM to arms and stretched his LE's with no active movement elicited.  Pt is definitely moving on command with UE's, and will continue on with his plan for rehab to follow up given his efforts with PT today.  Follow acutely to get as much accomplished with recover as possible before DC to rehab.   Follow Up Recommendations  SNF;Supervision/Assistance - 24 hour     Equipment Recommendations  Hospital bed;Wheelchair (measurements PT);Wheelchair cushion (measurements PT);Other (comment)    Recommendations for Other Services Rehab consult     Precautions / Restrictions Precautions Precautions: Fall Precaution Comments: fell in ED Restrictions Weight Bearing Restrictions: No    Mobility  Bed Mobility Overal bed mobility: Needs Assistance Bed Mobility: Supine to Sit;Sit to Supine     Supine to sit: Max assist Sit to supine: Max assist   General bed mobility comments: Was not happy about being moved, resisted with trunk ext to sit up side of bed  Transfers                 General transfer comment: too lethargic to attempt  Ambulation/Gait                 Stairs             Wheelchair Mobility    Modified Rankin (Stroke Patients Only)       Balance Overall balance assessment: Needs assistance;History of Falls Sitting-balance support: Feet supported;Bilateral upper extremity supported Sitting  balance-Leahy Scale: Poor                                      Cognition Arousal/Alertness: Lethargic Behavior During Therapy: Flat affect Overall Cognitive Status: Impaired/Different from baseline Area of Impairment: Orientation;Attention                 Orientation Level: Place;Time;Situation Current Attention Level: Selective Memory: Decreased recall of precautions;Decreased short-term memory Following Commands: Follows one step commands inconsistently;Follows one step commands with increased time     Problem Solving: Slow processing;Requires verbal cues;Requires tactile cues        Exercises General Exercises - Upper Extremity Shoulder Flexion: AAROM;5 reps Shoulder ABduction: AAROM;5 reps Shoulder ADduction: AAROM;5 reps Elbow Flexion: AAROM;5 reps Elbow Extension: AAROM;5 reps General Exercises - Lower Extremity Ankle Circles/Pumps: PROM    General Comments        Pertinent Vitals/Pain Pain Assessment: Faces Faces Pain Scale: No hurt    Home Living                      Prior Function            PT Goals (current goals can now be found in the care plan section) Acute Rehab PT Goals Patient Stated Goal: no verbalizations Progress towards PT goals: Progressing toward goals    Frequency  Min 3X/week      PT Plan Current plan remains appropriate    Co-evaluation              AM-PAC PT "6 Clicks" Mobility   Outcome Measure  Help needed turning from your back to your side while in a flat bed without using bedrails?: A Lot Help needed moving from lying on your back to sitting on the side of a flat bed without using bedrails?: A Lot Help needed moving to and from a bed to a chair (including a wheelchair)?: Total Help needed standing up from a chair using your arms (e.g., wheelchair or bedside chair)?: Total Help needed to walk in hospital room?: Total Help needed climbing 3-5 steps with a railing? : Total 6  Click Score: 8    End of Session   Activity Tolerance: Patient limited by fatigue;Patient limited by lethargy Patient left: in bed;with call bell/phone within reach;with bed alarm set Nurse Communication: Mobility status;Other (comment)(discussion about mits) PT Visit Diagnosis: Other abnormalities of gait and mobility (R26.89);Other symptoms and signs involving the nervous system (R29.898);History of falling (Z91.81);Repeated falls (R29.6)     Time: [id number] PT Time Calculation (min) (ACUTE ONLY): 34 min  Charges:  $Therapeutic Exercise: 8-22 mins $Therapeutic Activity: 8-22 mins                    Ramond Dial 09/06/2019, 5:29 PM   Mee Hives, PT MS Acute Rehab Dept. Number: [place] and [place]

## 2019-09-06 NOTE — Progress Notes (Signed)
  Speech Language Pathology Treatment: Dysphagia;Cognitive-Linquistic  Patient Details Name: [name] MRN: [id number] DOB: Mar 29, 1956 Today's Date: 09/06/2019 Time: 1010-1030 SLP Time Calculation (min) (ACUTE ONLY): 20 min  Assessment / Plan / Recommendation Clinical Impression  Pt seen at bedside for skilled ST intervention targeting goals for diet tolerance/advancement, attention, awareness, and problem solving. Pt allowed oral care with encouragement. Removal of peeling skin on lips and oral secretions was effective with suction. Pt was nonvocal throughout this session, and did not follow verbal commands, so unable to address cognitive-linguistic goals at this time. Following oral care, pt accepted ice chips and sips of water via straw. No anterior leakage or overt s/s aspiration observed. Will continue current diet (Dys2/thin) and skilled intervention for cognitive -linguistic goals per plan of care as pt participation improves. RN reports pt is febrile today.     HPI HPI: Pt is a 63 yo male admitted with AMS and falls. 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 PMH includes: CVA (L frontal in 2014, treated for aphasia, mild dysarthria, and cognitive impairment at that time), HTN      SLP Plan  Continue with current plan of care       Recommendations  Diet recommendations: Dysphagia 2 (fine chop);Thin liquid Liquids provided via: Cup;Straw Medication Administration: Whole meds with puree Supervision: Staff to assist with self feeding;Full supervision/cueing for compensatory strategies Compensations: Slow rate;Small sips/bites Postural Changes and/or Swallow Maneuvers: Seated upright 90 degrees;Upright 30-60 min after meal                Oral Care Recommendations: Oral care BID Follow up Recommendations: Inpatient Rehab SLP Visit Diagnosis: Dysphagia, unspecified (R13.10);Cognitive communication deficit [id number]) Plan: Continue with current plan of care       [name], [facility], [place] Speech Language Pathologist Office: [phone] Pager: [phone]  Shonna Chock 09/06/2019, 10:31 AM

## 2019-09-06 NOTE — TOC Progression Note (Signed)
Transition of Care [facility]) - Progression Note    Patient Details  Name: [name] MRN: [id number] Date of Birth: 1956/08/06  Transition of Care [facility]) CM/SW Contact  Sharin Mons, RN Phone Number: [phone] 09/06/2019, 9:22 AM  Clinical Narrative:    TOC team continues to monitor and follow. Pt not medically ready  ( poor po intake , IVF) and  without SNF bed offers.   Expected Discharge Plan: [place] Barriers to Discharge: SNF Pending bed offer, Continued Medical Work up  Expected Discharge Plan and Services Expected Discharge Plan: [place]   Discharge Planning Services: CM Consult   Living arrangements for the past 2 months: Single Family Home                                       Social Determinants of Health (SDOH) Interventions    Readmission Risk Interventions No flowsheet data found.

## 2019-09-07 LAB — CBC
HCT: 56.5 % — ABNORMAL HIGH (ref 39.0–52.0)
Hemoglobin: 18.3 g/dL — ABNORMAL HIGH (ref 13.0–17.0)
MCH: 27.4 pg (ref 26.0–34.0)
MCHC: 32.4 g/dL (ref 30.0–36.0)
MCV: 84.7 fL (ref 80.0–100.0)
Platelets: 322 10*3/uL (ref 150–400)
RBC: 6.67 MIL/uL — ABNORMAL HIGH (ref 4.22–5.81)
RDW: 15.6 % — ABNORMAL HIGH (ref 11.5–15.5)
WBC: 18.5 10*3/uL — ABNORMAL HIGH (ref 4.0–10.5)
nRBC: 0 % (ref 0.0–0.2)

## 2019-09-07 LAB — BASIC METABOLIC PANEL
Anion gap: 13 (ref 5–15)
BUN: 76 mg/dL — ABNORMAL HIGH (ref 8–23)
CO2: 27 mmol/L (ref 22–32)
Calcium: 9.3 mg/dL (ref 8.9–10.3)
Chloride: 107 mmol/L (ref 98–111)
Creatinine, Ser: 1.91 mg/dL — ABNORMAL HIGH (ref 0.61–1.24)
GFR calc Af Amer: 43 mL/min — ABNORMAL LOW (ref >60)
GFR calc non Af Amer: 37 mL/min — ABNORMAL LOW (ref >60)
Glucose, Bld: 155 mg/dL — ABNORMAL HIGH (ref 70–99)
Potassium: 3.4 mmol/L — ABNORMAL LOW (ref 3.5–5.1)
Sodium: 147 mmol/L — ABNORMAL HIGH (ref 135–145)

## 2019-09-07 MED ORDER — AMLODIPINE BESYLATE 10 MG PO TABS
10.0000 mg | ORAL_TABLET | Freq: Every day | ORAL | Status: DC
  Administered 2019-09-07 – 2019-09-16 (×8): 10 mg via ORAL
  Filled 2019-09-07 (×9): qty 1

## 2019-09-07 MED ORDER — POTASSIUM CHLORIDE CRYS ER 20 MEQ PO TBCR
40.0000 meq | EXTENDED_RELEASE_TABLET | Freq: Once | ORAL | Status: AC
  Administered 2019-09-07: 40 meq via ORAL
  Filled 2019-09-07: qty 2

## 2019-09-07 NOTE — Progress Notes (Signed)
Physical Therapy Treatment Patient Details Name: [name] MRN: [id number] DOB: Oct 08, 1955 Today's Date: 09/07/2019    History of Present Illness [name] is a 63 y.o. year old male with medical history significant for hypertension and previous stroke with some residual deficit who presented on 08/26/2019 with worsening confusion and several falls at home x1 day and was found to have hypertensive emergency with large occipital stroke.    PT Comments    Pt was seen for mobility and to get into the chair.  He is mainly keeping eyes closed but began to talk more this session than the last.  At times shouted out when asked a question, but overall very minimal interaction other than to be more willing to let therapists assist him.  Follow up with him to increase standing time with stedy perhaps next session, then to chair. Pt is demonstrating more potential to be a better rehab candidate to resume walking and transfers with lesser help.  Follow acutely for these needs to decrease stay needed in rehab.   Follow Up Recommendations  SNF     Equipment Recommendations  Hospital bed;Wheelchair (measurements PT);Wheelchair cushion (measurements PT);Other (comment)    Recommendations for Other Services       Precautions / Restrictions Precautions Precautions: Fall Precaution Comments: fell in ED Restrictions Weight Bearing Restrictions: No    Mobility  Bed Mobility Overal bed mobility: Needs Assistance Bed Mobility: Supine to Sit     Supine to sit: Max assist;+2 for physical assistance;+2 for safety/equipment     General bed mobility comments: pt was more willing to let the therapists move him today  Transfers Overall transfer level: Needs assistance Equipment used: 1 person hand held assist;2 person hand held assist Transfers: Sit to/from Stand;Stand Pivot Transfers Sit to Stand: Max assist;From elevated surface Stand pivot transfers: Total assist;+2 physical assistance;+2  safety/equipment;From elevated surface       General transfer comment: pt is unable to follow instructions fully, needed greater height to initiate a stand with pt giving a weaker effort to push through legs to stand  Ambulation/Gait             General Gait Details: unable   Stairs             Wheelchair Mobility    Modified Rankin (Stroke Patients Only)       Balance Overall balance assessment: Needs assistance;History of Falls Sitting-balance support: Feet supported;Bilateral upper extremity supported Sitting balance-Leahy Scale: Poor Sitting balance - Comments: pt loses balance quickly when letting go of assisted sitting Postural control: Posterior lean Standing balance support: Bilateral upper extremity supported Standing balance-Leahy Scale: Poor Standing balance comment: reliant on external support                            Cognition Arousal/Alertness: Lethargic Behavior During Therapy: Flat affect Overall Cognitive Status: Impaired/Different from baseline Area of Impairment: Following commands;Safety/judgement;Awareness;Problem solving;Attention                 Orientation Level: Place;Time;Situation Current Attention Level: Selective Memory: Decreased recall of precautions;Decreased short-term memory Following Commands: Follows one step commands inconsistently;Follows one step commands with increased time Safety/Judgement: Decreased awareness of safety;Decreased awareness of deficits Awareness: Intellectual Problem Solving: Slow processing;Requires verbal cues;Requires tactile cues;Decreased initiation;Difficulty sequencing General Comments: direct cueing to get pt to assist with all efforts to stand and pivot      Exercises      General Comments General  comments (skin integrity, edema, etc.): pt is calmer by end of session but initially more irritable about being assisted to get up      Pertinent Vitals/Pain Pain  Assessment: Faces Faces Pain Scale: Hurts a little bit Pain Location: generalized with moving legs and trunk to side of be Pain Descriptors / Indicators: Guarding Pain Intervention(s): Limited activity within patient's tolerance;Monitored during session;Repositioned    Home Living                      Prior Function            PT Goals (current goals can now be found in the care plan section) Acute Rehab PT Goals Patient Stated Goal: none stated Progress towards PT goals: Progressing toward goals    Frequency    Min 3X/week      PT Plan Current plan remains appropriate    Co-evaluation       OT goals addressed during session: ADL's and self-care      AM-PAC PT "6 Clicks" Mobility   Outcome Measure  Help needed turning from your back to your side while in a flat bed without using bedrails?: A Lot Help needed moving from lying on your back to sitting on the side of a flat bed without using bedrails?: A Lot Help needed moving to and from a bed to a chair (including a wheelchair)?: A Lot Help needed standing up from a chair using your arms (e.g., wheelchair or bedside chair)?: Total Help needed to walk in hospital room?: Total Help needed climbing 3-5 steps with a railing? : Total 6 Click Score: 9    End of Session Equipment Utilized During Treatment: Gait belt Activity Tolerance: Patient limited by fatigue;Treatment limited secondary to medical complications (Comment) Patient left: with call bell/phone within reach;in chair;with chair alarm set Nurse Communication: Mobility status;Other (comment)(about being up in chair) PT Visit Diagnosis: Other abnormalities of gait and mobility (R26.89);Other symptoms and signs involving the nervous system (R29.898);History of falling (Z91.81);Repeated falls (R29.6)     Time: [id number] PT Time Calculation (min) (ACUTE ONLY): 29 min  Charges:  $Therapeutic Activity: 8-22 mins                    [name] 09/07/2019, 1:32 PM   [name], PT MS Acute Rehab Dept. Number: [place] and [place]

## 2019-09-07 NOTE — Progress Notes (Signed)
Occupational Therapy Treatment Patient Details Name: [name] MRN: [id number] DOB: May 13, 1956 Today's Date: 09/07/2019    History of present illness [name] is a 63 y.o. year old male with medical history significant for hypertension and previous stroke with some residual deficit who presented on 08/26/2019 with worsening confusion and several falls at home x1 day and was found to have hypertensive emergency with large occipital stroke.   OT comments  Pt making minimal progress towards OT goals this session. Session focus on functional transfer training. Pt not following commands or conversing with therapist during session and noted to become agitated when trying to get pt to follow commands and attend to L side. Pt required MAX- total A to stand pivot to recliner with no ad. Continue to recommend SNF level therapy at time of DC. Will continue to follow acutely per POC.    Follow Up Recommendations  SNF;Supervision/Assistance - 24 hour    Equipment Recommendations  Other (comment)(defer to next venue of care)    Recommendations for Other Services      Precautions / Restrictions Precautions Precautions: Fall Precaution Comments: fell in ED Restrictions Weight Bearing Restrictions: No       Mobility Bed Mobility Overal bed mobility: Needs Assistance Bed Mobility: Supine to Sit     Supine to sit: Max assist;+2 for physical assistance;Mod assist;HOB elevated     General bed mobility comments: assist to maneuver BLEs to EOB and elevate trunk into sitting  Transfers Overall transfer level: Needs assistance Equipment used: 1 person hand held assist Transfers: Sit to/from [facility] Sit to Stand: Total assist;Max assist;+2 physical assistance Stand pivot transfers: Max assist;Total assist       General transfer comment: MAX- total A for all functional transfers    Balance Overall balance assessment: Needs assistance;History of  Falls Sitting-balance support: Feet supported;Bilateral upper extremity supported Sitting balance-Leahy Scale: Poor Sitting balance - Comments: MIN- MOD A to maintain upright posture with intermittent posterior lean Postural control: Posterior lean Standing balance support: Bilateral upper extremity supported Standing balance-Leahy Scale: Poor Standing balance comment: reliant on external support                           ADL either performed or assessed with clinical judgement   ADL Overall ADL's : Needs assistance/impaired                         Toilet Transfer: Maximal assistance;+2 for physical assistance;Stand-pivot Toilet Transfer Details (indicate cue type and reason): simulated to recliner         Functional mobility during ADLs: Maximal assistance;+2 for safety/equipment General ADL Comments: session focus on functional tranfer training from EOB>recliner with pt needing MAX A +2. pt limited by visual deficits, motor planning,  poor cognition, and decreased activity tolerance     Vision Patient Visual Report: Other (comment)(appears to have L inattention, kept eyes closed most of session. unable to fully assess as pt not following commands)     Perception     Praxis      Cognition   Behavior During Therapy: Flat affect Overall Cognitive Status: Impaired/Different from baseline Area of Impairment: Attention;Following commands;Safety/judgement;Awareness;Problem solving                   Current Attention Level: Selective   Following Commands: Follows one step commands inconsistently Safety/Judgement: Decreased awareness of safety;Decreased awareness of deficits Awareness: Intellectual Problem Solving: Slow  processing;Requires verbal cues;Requires tactile cues;Decreased initiation;Difficulty sequencing General Comments: pt unable to follow any commands, pt noted to yell out "what" when trying to get pt to follow one step commands         Exercises     Shoulder Instructions       General Comments pt appeared annoyed during session, not conversating with therapist and yelled out "what" when attempting to get pt to follow commands    Pertinent Vitals/ Pain       Pain Assessment: Faces Faces Pain Scale: Hurts a little bit Pain Location: general discomfort wity movement Pain Intervention(s): Limited activity within patient's tolerance;Repositioned;Monitored during session  Home Living                                          Prior Functioning/Environment              Frequency  Min 2X/week        Progress Toward Goals  OT Goals(current goals can now be found in the care plan section)  Progress towards OT goals: Progressing toward goals  Acute Rehab OT Goals OT Goal Formulation: Patient unable to participate in goal setting Time For Goal Achievement: 09/13/19 Potential to Achieve Goals: [place] Discharge plan remains appropriate    Co-evaluation    PT/OT/SLP Co-Evaluation/Treatment: Yes     OT goals addressed during session: ADL's and self-care      AM-PAC OT "6 Clicks" Daily Activity     Outcome Measure   Help from another person eating meals?: A Lot Help from another person taking care of personal grooming?: A Lot Help from another person toileting, which includes using toliet, bedpan, or urinal?: Total Help from another person bathing (including washing, rinsing, drying)?: Total Help from another person to put on and taking off regular upper body clothing?: Total Help from another person to put on and taking off regular lower body clothing?: Total 6 Click Score: 8    End of Session Equipment Utilized During Treatment: Gait belt  OT Visit Diagnosis: Hemiplegia and hemiparesis;Other symptoms and signs involving the nervous system (R29.898) Hemiplegia - Right/Left: Left Hemiplegia - dominant/non-dominant: Non-Dominant Hemiplegia - caused by: Cerebral infarction    Activity Tolerance Treatment limited secondary to agitation   Patient Left in chair;with call bell/phone within reach;with chair alarm set   Nurse Communication          Time: [id number] OT Time Calculation (min): 19 min  Charges: OT General Charges $OT Visit: 1 Visit OT Treatments $Therapeutic Activity: 8-22 mins  Lanier Clam., COTA/L Acute Rehabilitation Services [phone] [place] 09/07/2019, 12:59 PM

## 2019-09-07 NOTE — Progress Notes (Signed)
Pt finished 25% of his Ensure. RN had to spoon feed pt the Ensure in order for him to accept it.

## 2019-09-07 NOTE — Progress Notes (Signed)
PROGRESS NOTE    [name]  [id number] DOB: 1955-10-07 DOA: 08/26/2019 PCP: Alroy Dust, L.[name], MD     Brief Narrative:  [name] is a 63 y.o. year old male with medical history significant for hypertension and previous stroke with some residual deficit who presented on 08/26/2019 with worsening confusion and several falls at home x1 day and was found to have hypertensive emergency with large occipital stroke. In ED, initially noted to be hypertensive with blood pressure 230/125 and creatinine of 1.3.  Upon evaluation in the ED patient was intermittently following commands with noticeable facial droop and slurred speech per family consistent with his baseline of previous stroke.  CT head initially revealed age-indeterminate infarct.  Given persistently high blood pressure and intermittent confusion, neurology was consulted who recommended permissive hypertension and ordered MRI which showed a large occipital lobe stroke. Hospital course complicated by continued confusion. CT head done and showed no acute change from prior stroke.   New events last 24 hours / Subjective: No further fevers, HR much better. Not eating much, ensure started yesterday. Hopefully with encouragement he will increase PO intake and avoid PEG tube placement. Consulted palliative care today   Assessment & Plan:   Principal Problem:   Acute ischemic right PCA stroke (HCC) Active Problems:   HTN (hypertension)   Tobacco abuse   Cocaine abuse (HCC)   AKI (acute kidney injury) ([place])   Hyperlipidemia   Confusion   Hypokalemia    Large PCA territory infarct on the right, stable -History of previous stroke.  Found to be cocaine positive.  Likely in the setting of hypertensive emergency and substance abuse, likely thrombotic could also be embolic.  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  Preserved EF on TTE without PFO.  A1c 6.2, LDL 107, UDS positive for cocaine -Neurology f/u with [facility] in 4  weeks -Cardiology will arrange 30 day cardiac event monitoring for Afib rule out -Aspirin 325 mg p.o.,  plavix 75 mg qd for DAPT x 3 months, then plavix alone per neurology  Decreased PO intake -Encourage ensure. ?PEG. Palliative care consulted for goals of care conversation   Hypernatremia -Likely secondary to decreased PO intake. Started D5-0.45NS IVF, monitor BMP   AKI on CKD stage 3b  -Last creatinine from 2014 was 1.14. Cr 1.32 on admission, has ranged from 1.3-1.6 this admission  -Continue to monitor BMP on IV fluid -Stop hydrochlorothiazide and lisinopril  Hypokalemia -Replace, trend  Leukocytosis -Could be secondary to contracture (increase in Hgb and platelets as well) vs sepsis -Monitor CBC, obtain blood culture for T>100.4  -IVF  -Improved overnight without antibiotic  Acute metabolic encephalopathy -Will likely be prone to bouts of confusion given large PCA stroke  -Repeat CT head shows no evolution or worsening of known disease after fall in ED on admission -Stable   Essential HTN  -Allow permissive hypertension, okay if less than 220/120 over the next 5 to 7 days post acute CVA -Stop lisinopril and hydrochlorothiazide in setting of AKI.  Start Norvasc  Substance abuse. 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  Palliative care consulted  today for goals of care conversation   Consultants:   Neurology  Palliative care  Procedures:   None   Antimicrobials:  Anti-infectives (From admission, onward)   None        Objective: Vitals:   09/06/19 1200 09/06/19 1534 09/06/19 2302 09/07/19 0733  BP:  134/84 (!) 136/98 (!) 153/99  Pulse:  (!) 105 99 96  Resp:  18 15 18   Temp: 98.8 F (37.1 C) 99.3 F (37.4 C) 97.9 F (36.6 C) 98 F (36.7 C)  TempSrc: Axillary Axillary Axillary Oral  SpO2:  94% 95% 97%  Weight:      Height:        Intake/Output Summary (Last 24 hours) at 09/07/2019 0922 Last data filed at 09/07/2019 [id number] Gross per 24 hour  Intake 171.97 ml  Output 700 ml  Net -528.03 ml   Filed Weights   09/04/19 0847  Weight: 85 kg     Examination: General exam: Appears calm and comfortable  Respiratory system: Clear to auscultation. Respiratory effort normal. Cardiovascular system: S1 & S2 heard, tachycardic, regular rhythm. No pedal edema. Gastrointestinal system: Abdomen is nondistended, soft and nontender. Normal bowel sounds heard. Central nervous system: Alert, does not really interact much or follows commands Extremities: Symmetric in appearance bilaterally  Skin: No rashes, lesions or ulcers on exposed skin    Data Reviewed: I have personally reviewed following labs and imaging studies  CBC: Recent Labs  Lab 09/06/19 0235 09/07/19 0228  WBC 21.3* 18.5*  HGB 19.1* 18.3*  HCT 58.3* 56.5*  MCV 84.1 84.7  PLT 373 [id number]   Basic Metabolic Panel: Recent Labs  Lab 09/01/19 0149 09/02/19 0309 09/06/19 0235 09/07/19 0228  NA 140 141 148* 147*  K 4.2 3.6 3.9 3.4*  CL 104 104 105 107  CO2 23 23 28 27   GLUCOSE 120* 118* 136* 155*  BUN 24* 31* 56* 76*  CREATININE 1.60* 1.48* 1.61* 1.91*  CALCIUM 9.0 9.3 9.6 9.3   GFR: Estimated Creatinine Clearance: 43.3 mL/min (A) (by C-G formula based on SCr of 1.91 mg/dL (H)). Liver Function Tests: No results for input(s): AST, ALT, ALKPHOS, BILITOT, PROT, ALBUMIN in the last 168 hours. No results for input(s): LIPASE, AMYLASE in the last 168 hours. No results for input(s): AMMONIA in the last 168 hours. Coagulation  Profile: No results for input(s): INR, PROTIME in the last 168 hours. Cardiac Enzymes: No results for input(s): CKTOTAL, CKMB, CKMBINDEX, TROPONINI in the last 168 hours. BNP (last 3 results) No results for input(s): PROBNP in the last 8760 hours. HbA1C: No results for input(s): HGBA1C in the last 72 hours. CBG: No results for input(s): GLUCAP in the last 168 hours. Lipid Profile: No results for input(s): CHOL, HDL, LDLCALC, TRIG, CHOLHDL, LDLDIRECT in the last 72 hours. Thyroid Function Tests: No results for input(s): TSH, T4TOTAL, FREET4, T3FREE, THYROIDAB in the last 72 hours. Anemia Panel: No results for input(s): VITAMINB12, FOLATE, FERRITIN, TIBC, IRON, RETICCTPCT in the last 72 hours. Sepsis Labs: No results for input(s): PROCALCITON, LATICACIDVEN in the last 168 hours.  No results found for this or any previous visit (from the past 240 hour(s)).    Radiology Studies: No results found.    Scheduled Meds: .  stroke: mapping our early stages of recovery book   Does not apply Once  . amLODipine  10 mg Oral Daily  . aspirin  300 mg Rectal Daily   Or  . aspirin  325 mg Oral Daily  . atorvastatin  80 mg Oral q1800  . clopidogrel  75 mg Oral Daily  . feeding supplement (ENSURE ENLIVE)  237 mL Oral TID BM  . potassium chloride  40 mEq Oral Once   Continuous Infusions: . dextrose 5 % and 0.45% NaCl 75 mL/hr at 09/06/19 2359     LOS: 11 days      Time spent: 25 minutes   Dessa Phi, DO [facility] 09/07/2019, 9:22 AM   Available via Epic secure chat 7am-7pm After these hours, please refer to coverage provider listed on [email or web address]

## 2019-09-08 DIAGNOSIS — Z515 Encounter for palliative care: Secondary | ICD-10-CM

## 2019-09-08 DIAGNOSIS — R4182 Altered mental status, unspecified: Secondary | ICD-10-CM

## 2019-09-08 DIAGNOSIS — Z7189 Other specified counseling: Secondary | ICD-10-CM

## 2019-09-08 LAB — BASIC METABOLIC PANEL
Anion gap: 11 (ref 5–15)
BUN: 50 mg/dL — ABNORMAL HIGH (ref 8–23)
CO2: 28 mmol/L (ref 22–32)
Calcium: 9 mg/dL (ref 8.9–10.3)
Chloride: 109 mmol/L (ref 98–111)
Creatinine, Ser: 1.24 mg/dL (ref 0.61–1.24)
GFR calc Af Amer: >60 mL/min (ref >60)
GFR calc non Af Amer: >60 mL/min (ref >60)
Glucose, Bld: 132 mg/dL — ABNORMAL HIGH (ref 70–99)
Potassium: 3.8 mmol/L (ref 3.5–5.1)
Sodium: 148 mmol/L — ABNORMAL HIGH (ref 135–145)

## 2019-09-08 LAB — CBC
HCT: 55.5 % — ABNORMAL HIGH (ref 39.0–52.0)
Hemoglobin: 17.6 g/dL — ABNORMAL HIGH (ref 13.0–17.0)
MCH: 27.3 pg (ref 26.0–34.0)
MCHC: 31.7 g/dL (ref 30.0–36.0)
MCV: 86.2 fL (ref 80.0–100.0)
Platelets: 341 10*3/uL (ref 150–400)
RBC: 6.44 MIL/uL — ABNORMAL HIGH (ref 4.22–5.81)
RDW: 14.5 % (ref 11.5–15.5)
WBC: 12.8 10*3/uL — ABNORMAL HIGH (ref 4.0–10.5)
nRBC: 0 % (ref 0.0–0.2)

## 2019-09-08 LAB — GLUCOSE, CAPILLARY: Glucose-Capillary: 140 mg/dL — ABNORMAL HIGH (ref 70–99)

## 2019-09-08 MED ORDER — DEXTROSE-NACL 5-0.45 % IV SOLN: INTRAVENOUS | Status: AC

## 2019-09-08 NOTE — Consult Note (Signed)
Consultation Note Date: 09/08/2019   Patient Name: [name]  DOB: 1956-03-26  MRN: [id number]  Age / Sex: 63 y.o., male   PCP: [name], L.[name] Sa, MD Referring Physician: Kinnie Feil, MD   REASON FOR CONSULTATION:Establishing goals of care  Palliative Care consult requested for goals of care in this 63 y.o. male with multiple medical problems including hypertension, psoriasis, CVA with some residual, and CKD stage 3b. He presented to ED with complaints of slurred speech and facial droop. It was unclear exactly what time his symptoms started. MRI showed large acute right PCA infarct involving the medial temporal lobe and occipital lobe with additional smaller foci of acute ischemia. CT of head and neck showed right posterior cerebral artery occlusion with bilateral severe M2 stenosis. UDS positive for cocaine. Patient having intermittent confusion. Poor po intake. He has been evaluated by SLP with recommendations for dysphagia 2 diet.    Clinical Assessment and Goals of Care: I have reviewed medical records including lab results, imaging, Epic notes, and MAR, received report from the bedside RN, and assessed the patient. I spoke with patient's wife, [name] via phone to discuss diagnosis prognosis, [place], EOL wishes, disposition and options. Patient awake and alert in bed. He denied pain or shortness of breath. Alert to self and location. Delayed responses noted with simple responses. Unable to verbalize year and when informed of month could not associate any upcoming or past holidays.   When speaking with wife I introduced Palliative Medicine as specialized medical care for people living with serious illness. It focuses on providing relief from the symptoms and stress of a serious illness. The goal is to improve quality of life for both the patient and the family.   Wife reports prior to admission patient was able to ambulate independently with no devices. He did have a noticeable  gait disturbance due to previous stroke. He performed most ADLs independently. She reports a few months ago patient did began falling and actually fell 3 times in the same day. She reports this caused her to be more attentive to him when moving around.  We discussed His current illness and what it means in the larger context of His on-going co-morbidities. With specific discussions regarding recent CVA, hypertension, and his overall functional and nutritional decline. Natural disease trajectory and expectations at EOL were discussed.  Wife reports her awareness of patient's current illness. She shares she remains hopeful for his improvement. She is inquiring about him being placed at SNF for rehab and the pending authorizations. Discussed per notations from CM/SW bed offers remain pending.   I attempted to elicit values and goals of care important to the patient.    The difference between aggressive medical intervention and comfort care was considered in light of the patient's goals of care. I discussed in detail with wife patient's poor po intake. She verbalized understanding and expressed she was hopeful patient would be tolerating and drinking Ensure as he did prior to admission. Support given with education and emphasis on in addition to Ensure patient would also need other forms of nutrition. She verbalized understanding and expressed concerns of possible depression or patient not liking the food in the hospital. Updates provided on patient not showing interest in food despite staff attempts and encouragement.   We discussed artificial feeding and PEG placement in the event patient continues to have poor po intake versus comfort care. Risk and benefits of PEG was discussed. Wife verbalized she is hopeful patient  will improve and not require artificial feedings, however if he does not she would want to provide him with every opportunity to live. She reports she would like to proceed with PEG if it is  required, however she would like to watchfully wait for a few more days. She plans to come visit him later and discuss with him sharing she feels he may not have an appetite or may be depressed. She wishes to have a conversation with him and see how he is doing over the next few days expressing artificial feeding would be the last resort.    Advanced directives, concepts specific to code status, artifical feeding and hydration, and rehospitalization were considered and discussed. Patient does not have a documented advanced directive per wife. I discussed patient's full code status with consideration to his current illness and co-morbidities. [name] is requesting full code and full aggressive interventions.   Wife requesting to watchfully wait and not sure if she would like outpatient palliative support. She reports her main concern at this point is for patient to get better and for him to go to SNF for rehab.   Questions and concerns were addressed.  Wife was encouraged to call with questions or concerns.  PMT will continue to support holistically.   SOCIAL HISTORY:     reports that he quit smoking about 6 years ago. His smoking use included cigarettes. He has a 9.25 pack-year smoking history. He has never used smokeless tobacco. He reports current alcohol use of about 17.0 standard drinks of alcohol per week. He reports current drug use. Drug: Cocaine.  CODE STATUS: Full code  ADVANCE DIRECTIVES: Next of kin    SYMPTOM MANAGEMENT: per attending   Palliative Prophylaxis:   Aspiration, Bowel Regimen, Delirium Protocol, Frequent Pain Assessment, Oral Care and Turn Reposition  PSYCHO-SOCIAL/SPIRITUAL:  Support System: family   Desire for further Chaplaincy support: no   Additional Recommendations (Limitations, Scope, Preferences):  Full Scope Treatment   PAST MEDICAL HISTORY: Past Medical History:  Diagnosis Date  . Anginal pain ([place])   . Heart murmur   . Hypertension   .  Psoriasis    "back and stomach" (02/03/2013)  . Stroke ([place]) 02/01/2013   left frontal CVA and symptoms of right arm and leg weakness and expressive aphasia/notes 02/03/2013 (02/03/2013)    PAST SURGICAL HISTORY:  Past Surgical History:  Procedure Laterality Date  . NO PAST SURGERIES    . TEE WITHOUT CARDIOVERSION N/A 02/09/2013   Procedure: TRANSESOPHAGEAL ECHOCARDIOGRAM (TEE);  Surgeon: Lelon Perla, MD;  Location: [facility] ENDOSCOPY;  Service: Cardiovascular;  Laterality: N/A;    ALLERGIES:  is allergic to shellfish allergy.   MEDICATIONS:  Current Facility-Administered Medications  Medication Dose Route Frequency Provider Last Rate Last Admin  .  stroke: mapping our early stages of recovery book   Does not apply Once Jani Gravel, MD      . acetaminophen (TYLENOL) tablet 650 mg  650 mg Oral Q4H PRN Jani Gravel, MD   650 mg at 09/06/19 [id number]   Or  . acetaminophen (TYLENOL) 160 MG/5ML solution 650 mg  650 mg Per Tube Q4H PRN Jani Gravel, MD       Or  . acetaminophen (TYLENOL) suppository 650 mg  650 mg Rectal Q4H PRN Jani Gravel, MD      . amLODipine (NORVASC) tablet 10 mg  10 mg Oral Daily Dessa Phi, DO   10 mg at 09/08/19 [id number]  . aspirin suppository 300 mg  300 mg  Rectal Daily Jani Gravel, MD       Or  . aspirin tablet 325 mg  325 mg Oral Daily Jani Gravel, MD   325 mg at 09/08/19 [id number]  . atorvastatin (LIPITOR) tablet 80 mg  80 mg Oral q1800 Jani Gravel, MD   80 mg at 09/06/19 1701  . clopidogrel (PLAVIX) tablet 75 mg  75 mg Oral Daily Oretha Milch D, MD   75 mg at 09/08/19 0926  . dextrose 5 %-0.45 % sodium chloride infusion   Intravenous Continuous Buriev, Arie Sabina, MD      . feeding supplement (ENSURE ENLIVE) (ENSURE ENLIVE) liquid 237 mL  237 mL Oral TID BM Dessa Phi, DO   237 mL at 09/08/19 0927  . labetalol (NORMODYNE) injection 10 mg  10 mg Intravenous Once PRN Maudie Flakes, MD        VITAL SIGNS: BP (!) 162/106 (BP Location: Right Arm)   Pulse 92   Temp (!) 97 F  (36.1 C) (Oral)   Resp 16   Ht 5\' 9"  (1.753 m)   Wt 85 kg   SpO2 99%   BMI 27.67 kg/m  Filed Weights   09/04/19 0847  Weight: 85 kg    Estimated body mass index is 27.67 kg/m as calculated from the following:   Height as of this encounter: 5\' 9"  (1.753 m).   Weight as of this encounter: 85 kg.  LABS: CBC:    Component Value Date/Time   WBC 12.8 (H) 09/08/2019 0206   HGB 17.6 (H) 09/08/2019 0206   HCT 55.5 (H) 09/08/2019 0206   PLT 341 09/08/2019 0206   Comprehensive Metabolic Panel:    Component Value Date/Time   NA 148 (H) 09/08/2019 0206   K 3.8 09/08/2019 0206   CO2 28 09/08/2019 0206   BUN 50 (H) 09/08/2019 0206   CREATININE 1.24 09/08/2019 0206   ALBUMIN 3.8 08/27/2019 0846     Review of Systems  Unable to perform ROS: Acuity of condition   Physical Exam General: NAD, chronically ill appearing Cardiovascular: regular rate and rhythm Pulmonary: clear ant fields Abdomen: soft, nontender, + bowel sounds Extremities: no edema, no joint deformities Skin: no rashes Neurological: awake, alert to self and location, intermittent confusion, delayed speech   Prognosis: Guarded   Discharge Planning:  To Be Determined Wife is requesting SNF for rehab   Recommendations:  Full Code-as confirmed/requested by wife  Continue with current plan of care per medical team  Wife requesting full scope and aggressive medical interventions. Watchful waiting. Remains hopeful for some improvement. PEG placement if no improvement in po intake over the next several days.   Wife requesting SNF for rehab.   PMT will continue to support and follow as needed.    Palliative Performance Scale:                Wife expressed understanding and was in agreement with this plan.   Thank you for allowing the Palliative Medicine Team to assist in the care of this patient.  Time In: 1030 Time Out: 1125 Time Total: 55 min.   Visit consisted of counseling and education dealing  with the complex and emotionally intense issues of symptom management and palliative care in the setting of serious and potentially life-threatening illness.Greater than 50%  of this time was spent counseling and coordinating care related to the above assessment and plan.  Signed by:  Alda Lea, AGPCNP-BC Palliative Medicine Team  Phone: [phone] Fax: [phone] Pager: [phone]  Amion: N. [place]

## 2019-09-08 NOTE — Progress Notes (Signed)
  Speech Language Pathology Treatment: Dysphagia;Cognitive-Linquistic  Patient Details Name: [name] MRN: [id number] DOB: [birth year] Today's Date: 09/08/2019 Time: 4680-3212 SLP Time Calculation (min) (ACUTE ONLY): 17 min  Assessment / Plan / Recommendation Clinical Impression  Pt was reluctant to SLP's intervention this morning and required max cues/encouragement/repetition and additional time to respond to dysphagia and cognitive goals. Eventually accepted sips water and 2 bites of the puree textures on his minced/ground texture breakfast tray. Delayed manipulation and propulsion but no s/s aspiration. Difficulty coordinating using straw initially with consumption 3-4 sips.Goal was to reassess at bedside ability to upgrade from minced/grouind however not attempted given clinical observations this session.   Cognitively, responses were delayed, or absent needing repetition. He was oriented to place and reason independently, not to upcoming holiday and could not recall "Christmas" after 5 minute delay needing semantic cues. Responded in single words with part word dysfluency noted on initial sound. Simple/functional problem solving situations were met with uncertainty and therapist provided visual and verbal cues to environment (call bell etc). Therapist is downgrading frequency to minimum of one time a week and will see pt more frequently if able.       HPI HPI: Pt is a 63 yo male admitted with AMS and falls. 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 PMH includes: CVA (L frontal in 2014, treated for aphasia, mild dysarthria, and cognitive impairment at that time), HTN      SLP Plan  Continue with current plan of care       Recommendations  Diet recommendations: Dysphagia 2 (fine chop);Thin liquid Liquids provided via: Cup;Straw Medication Administration: Crushed with puree Supervision: Staff to assist with self feeding;Full supervision/cueing for compensatory  strategies Compensations: Slow rate;Small sips/bites Postural Changes and/or Swallow Maneuvers: Seated upright 90 degrees                Oral Care Recommendations: Oral care BID Follow up Recommendations: Skilled Nursing facility SLP Visit Diagnosis: Dysphagia, unspecified (R13.10);Cognitive communication deficit (Y48.250) Plan: Continue with current plan of care       GO                Houston Siren 09/08/2019, 10:34 AM  Orbie Pyo Colvin Caroli.Ed Risk analyst [phone] Office [phone]

## 2019-09-08 NOTE — Progress Notes (Signed)
[facility] PROGRESS NOTE  [name] [id number] DOB: July 31, 1956 DOA: 08/26/2019 PCP: Alroy Dust, L.[name], MD  Brief summary   63 y.o.year old malewith medical history significant for hypertension and previous stroke with some residual deficit who presented on 12/10/2020with worsening confusion and several falls at home x1 day and was found to have hypertensive emergency with large occipital stroke. In ED, initially noted to be hypertensive with blood pressure 230/125 and creatinine of 1.3. Upon evaluation in the ED patient was intermittently following commands with noticeable facial droop and slurred speech per family consistent with his baseline of previous stroke. CT head initially revealed age-indeterminate infarct. Given persistently high blood pressure and intermittent confusion, neurology was consulted who recommended permissive hypertension and ordered MRI which showed a large occipital lobe stroke. Hospital course complicated by continued confusion.CT head done and showed no acute change from prior stroke.   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 Found to be cocaine positive. Likely in the setting of hypertensive emergency and substance abuse, likely thrombotic could also be embolic. 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 Preserved EF on TTE without PFO. A1c 6.2, LDL 107, UDS positive for cocaine -Neurology f/u with [facility] in 4 weeks -Cardiology will arrange30 day cardiac event monitoring for Afib rule out -Aspirin 325 mg p.o., plavix 75 mg qd for DAPT x 3 months, then plavix alone per neurology  Decreased PO intake -Encourage ensure. ?PEG. Palliative care consulted for goals of care conversation   Hypernatremia -Likely secondary to decreased PO intake. On gentle iv fluids. monitor BMP   AKI on CKD stage 3b  -Last creatinine from 2014 was 1.14. Cr 1.32 on admission, has ranged from  1.3-1.6 this admission  -Continue to monitor BMP on IV fluid -Stopped hydrochlorothiazide and lisinopril  Hypokalemia -Replaced, trend  Leukocytosis -Could be secondary to contracture (increase in Hgb and platelets as well) vs sepsis -Monitor CBC, obtain blood culture for T>100.4  -IVF  -Improved overnight without antibiotic  Acute metabolic encephalopathy -Will likely be prone to bouts of confusion given large PCA stroke  -Repeat CT head shows no evolution or worsening of known disease after fall in ED on admission -Stable   Essential HTN  -Allowed permissive hypertension, okay if less than 220/120 over the next 5 to 7 days post acute CVA -Stopped lisinopril and hydrochlorothiazide in setting of AKI.  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 Needs SNF at discharge  Code Status: full Family Communication: d/w patient, RN (indicate person spoken with, relationship, and if by phone, the number) Disposition Plan: needs SNF   Consultants:  Neurology  Palliative care  Procedures:  none  Antibiotics: Anti-infectives (From admission, onward)   None        (indicate start date, and stop date if known)  HPI/Subjective: No distress. No acute dyspnea. No acute issues overnight.   Objective: Vitals:   09/07/19 2310 09/08/19 0728  BP: (!) 140/100 (!) 162/106  Pulse: 85 92  Resp: 10 16  Temp: 97.8 F (36.6 C) (!) 97 F (36.1 C)  SpO2: 100% 99%    Intake/Output Summary (Last 24 hours) at  09/08/2019 1046 Last data filed at 09/08/2019 0800 Gross per 24 hour  Intake -   Output 1350 ml  Net -1350 ml   Filed Weights   09/04/19 0847  Weight: 85 kg    Exam:   General:  No distress   Cardiovascular: s1,s2 rrr  Respiratory: CTA BL  Abdomen: soft,. nt   Musculoskeletal: no leg edema    Data Reviewed: Basic Metabolic Panel: Recent Labs  Lab 09/02/19 0309 09/06/19 0235 09/07/19 0228 09/08/19 0206  NA 141 148* 147* 148*  K 3.6 3.9 3.4* 3.8  CL 104 105 107 109  CO2 23 28 27 28   GLUCOSE 118* 136* 155* 132*  BUN 31* 56* 76* 50*  CREATININE 1.48* 1.61* 1.91* 1.24  CALCIUM 9.3 9.6 9.3 9.0   Liver Function Tests: No results for input(s): AST, ALT, ALKPHOS, BILITOT, PROT, ALBUMIN in the last 168 hours. No results for input(s): LIPASE, AMYLASE in the last 168 hours. No results for input(s): AMMONIA in the last 168 hours. CBC: Recent Labs  Lab 09/06/19 0235 09/07/19 0228 09/08/19 0206  WBC 21.3* 18.5* 12.8*  HGB 19.1* 18.3* 17.6*  HCT 58.3* 56.5* 55.5*  MCV 84.1 84.7 86.2  PLT 373 322 341   Cardiac Enzymes: No results for input(s): CKTOTAL, CKMB, CKMBINDEX, TROPONINI in the last 168 hours. BNP (last 3 results) No results for input(s): BNP in the last 8760 hours.  ProBNP (last 3 results) No results for input(s): PROBNP in the last 8760 hours.  CBG: No results for input(s): GLUCAP in the last 168 hours.  No results found for this or any previous visit (from the past 240 hour(s)).   Studies: No results found.  Scheduled Meds: .  stroke: mapping our early stages of recovery book   Does not apply Once  . amLODipine  10 mg Oral Daily  . aspirin  300 mg Rectal Daily   Or  . aspirin  325 mg Oral Daily  . atorvastatin  80 mg Oral q1800  . clopidogrel  75 mg Oral Daily  . feeding supplement (ENSURE ENLIVE)  237 mL Oral TID BM   Continuous Infusions: . dextrose 5 % and 0.45% NaCl 75 mL/hr at 09/06/19 2359    Principal Problem:   Acute ischemic right PCA stroke (HCC) Active Problems:   HTN (hypertension)   Tobacco abuse    Cocaine abuse ([place])   AKI (acute kidney injury) ([place])   Hyperlipidemia   Confusion   Hypokalemia    Time spent: >25 minutes     Kinnie Feil  [facility] Pager [phone]. If 7PM-7AM, please contact night-coverage at [email or web address], password [facility] 09/08/2019, 10:46 AM  LOS: 12 days

## 2019-09-09 NOTE — Progress Notes (Signed)
[facility] PROGRESS NOTE  [name] [id number] DOB: 1956/08/16 DOA: 08/26/2019 PCP: Alroy Dust, L.[name], MD  Brief summary   63 y.o.year old malewith medical history significant for hypertension and previous stroke with some residual deficit who presented on 12/10/2020with worsening confusion and several falls at home x1 day and was found to have hypertensive emergency with large occipital stroke. In ED, initially noted to be hypertensive with blood pressure 230/125 and creatinine of 1.3. Upon evaluation in the ED patient was intermittently following commands with noticeable facial droop and slurred speech per family consistent with his baseline of previous stroke. CT head initially revealed age-indeterminate infarct. Given persistently high blood pressure and intermittent confusion, neurology was consulted who recommended permissive hypertension and ordered MRI which showed a large occipital lobe stroke. Hospital course complicated by continued confusion.CT head done and showed no acute change from prior stroke.   Today 09/09/19 pt is alert but minimally participatory in interview/exam. Denies pain or other complaints. Discharge pending placement.   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 Found to be cocaine positive. Likely in the setting of hypertensive emergency and substance abuse, likely thrombotic could also be embolic. 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 Preserved EF on TTE without PFO. A1c 6.2, LDL 107, UDS positive for cocaine -Neurology f/u with [facility] in 4 weeks -Cardiology will arrange30 day cardiac event monitoring for Afib rule out -Aspirin 325 mg p.o., plavix 75 mg qd for DAPT x 3 months, then plavix alone per neurology  Decreased PO intake -Encourage ensure or trial other PO intake per patient preference, aspiration precautions. ?PEG. Palliative care consulted for goals of care  conversation.    Hypernatremia -Likely secondary to decreased PO intake. On gentle iv fluids. monitor BMP daily   AKI on CKD stage 3b  -Last creatinine from 2014 was 1.14. Cr 1.32 on admission, has ranged from 1.3-1.6 this admission  -Continue to monitor BMP on IV fluid -Stopped hydrochlorothiazide and lisinopril - permissive hypertension, BP ok for now   Hypokalemia -Replaced, trend  Leukocytosis -Could be secondary to contracture (increase in Hgb and platelets as well) vs sepsis -Monitor CBC, obtain blood culture for T>100.4  -IVF  -Improved overnight without antibiotic  Acute metabolic encephalopathy -Will likely be prone to bouts of confusion given large PCA stroke  -Repeat CT head shows no evolution or worsening of known disease after fall in ED on admission -Stable   Essential HTN  -Allowed permissive hypertension, okay if less than 220/120 over the next 5 to 7 days post acute CVA -Stopped lisinopril and hydrochlorothiazide in setting of AKI.  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 Needs SNF at discharge  Code Status: full Family Communication: none today  Disposition Plan: needs SNF, awaiting placement    Consultants:  Neurology  Palliative care  Procedures:  none  Antibiotics: Anti-infectives (From admission, onward)   None      (indicate start date, and stop date if known)  HPI/Subjective: No distress. No acute dyspnea. No acute issues overnight.   Objective: Vitals:   09/08/19 2229 09/09/19 0729  BP: (!) 159/90 (!) 158/89  Pulse:  82 77  Resp: 18  16  Temp: 98.3 F (36.8 C) 98.2 F (36.8 C)  SpO2: 100% 100%    Intake/Output Summary (Last 24 hours) at 09/09/2019 1020 Last data filed at 09/09/2019 0600 Gross per 24 hour  Intake 852.38 ml  Output 1600 ml  Net -747.62 ml   Filed Weights   09/04/19 0847  Weight: 85 kg    Exam:   General:  No distress   Cardiovascular: S1S1 RRR  Respiratory: CTA BL  Abdomen: soft,. nt   Musculoskeletal: no leg edema    Data Reviewed: Basic Metabolic Panel: Recent Labs  Lab 09/06/19 0235 09/07/19 0228 09/08/19 0206  NA 148* 147* 148*  K 3.9 3.4* 3.8  CL 105 107 109  CO2 28 27 28   GLUCOSE 136* 155* 132*  BUN 56* 76* 50*  CREATININE 1.61* 1.91* 1.24  CALCIUM 9.6 9.3 9.0   Liver Function Tests: No results for input(s): AST, ALT, ALKPHOS, BILITOT, PROT, ALBUMIN in the last 168 hours. No results for input(s): LIPASE, AMYLASE in the last 168 hours. No results for input(s): AMMONIA in the last 168 hours. CBC: Recent Labs  Lab 09/06/19 0235 09/07/19 0228 09/08/19 0206  WBC 21.3* 18.5* 12.8*  HGB 19.1* 18.3* 17.6*  HCT 58.3* 56.5* 55.5*  MCV 84.1 84.7 86.2  PLT 373 322 341   Cardiac Enzymes: No results for input(s): CKTOTAL, CKMB, CKMBINDEX, TROPONINI in the last 168 hours. BNP (last 3 results) No results for input(s): BNP in the last 8760 hours.  ProBNP (last 3 results) No results for input(s): PROBNP in the last 8760 hours.  CBG: Recent Labs  Lab 09/08/19 1943  GLUCAP 140*    No results found for this or any previous visit (from the past 240 hour(s)).   Studies: No results found.  Scheduled Meds: .  stroke: mapping our early stages of recovery book   Does not apply Once  . amLODipine  10 mg Oral Daily  . aspirin  300 mg Rectal Daily   Or  . aspirin  325 mg Oral Daily  . atorvastatin  80 mg Oral q1800  . clopidogrel  75 mg Oral Daily  . feeding supplement (ENSURE ENLIVE)  237 mL Oral TID BM   Continuous Infusions: . dextrose 5 % and 0.45% NaCl 75  mL/hr at 09/08/19 1534    Principal Problem:   Acute ischemic right PCA stroke (HCC) Active Problems:   HTN (hypertension)   Tobacco abuse   Cocaine abuse ([place])   AKI (acute kidney injury) ([place])   Hyperlipidemia   Confusion   Hypokalemia    Time spent: >25 minutes     [name]  [facility]  09/09/2019, 10:20 AM  LOS: 13 days

## 2019-09-09 NOTE — Evaluation (Signed)
Occupational Therapy Evaluation Patient Details Name: [name] MRN: [id number] DOB: [birth year] Today's Date: 09/09/2019    History of Present Illness [name] is a 63 y.o. year old male with medical history significant for hypertension and previous stroke with some residual deficit who presented on 08/26/2019 with worsening confusion and several falls at home x1 day and was found to have hypertensive emergency with large occipital stroke.   Clinical Impression   Focus of session on self feeding, positioned upright in bed. Pt with decreased activity tolerance. Brought loaded spoon to mouth x 3 with R hand and L hand to mouth with min assist to drink Breeze. Pt stating items tasted good, but refused any further food, even when fed. Continues be be appropriate for SNF level rehab.     Follow Up Recommendations  SNF;Supervision/Assistance - 24 hour    Equipment Recommendations       Recommendations for Other Services       Precautions / Restrictions Precautions Precautions: Fall Precaution Comments: fell in ED Restrictions Weight Bearing Restrictions: No      Mobility Bed Mobility            General bed mobility comments: total assist to position pt upright in bed for eating  Transfers                 General transfer comment: pt refused OOB    Balance                                           ADL either performed or assessed with clinical judgement   ADL   Eating/Feeding: Moderate assistance;Bed level   Grooming: Wash/dry hands;Wash/dry face;Total assistance                                       Vision   Additional Comments: pt appearing to see drink and magic cup placed at midline     Perception     Praxis      Pertinent Vitals/Pain Pain Assessment: No/denies pain Faces Pain Scale: No hurt     Hand Dominance     Extremity/Trunk Assessment             Communication     Cognition  Arousal/Alertness: Awake/alert Behavior During Therapy: Flat affect Overall Cognitive Status: Impaired/Different from baseline Area of Impairment: Orientation;Attention;Memory;Following commands;Safety/judgement;Awareness;Problem solving                 Orientation Level: Disoriented to;Place;Time;Situation(able to remember it's almost Christmas at end of session) Current Attention Level: Focused Memory: Decreased recall of precautions;Decreased short-term memory Following Commands: Follows one step commands inconsistently;Follows one step commands with increased time(with multimodal cues) Safety/Judgement: Decreased awareness of safety;Decreased awareness of deficits Awareness: Intellectual Problem Solving: Slow processing;Requires tactile cues;Requires verbal cues General Comments: pt declined to get OOB   General Comments      Exercises    Shoulder Instructions      Home Living                                          Prior Functioning/Environment  OT Problem List:        OT Treatment/Interventions:      OT Goals(Current goals can be found in the care plan section) Acute Rehab OT Goals Patient Stated Goal: none stated OT Goal Formulation: Patient unable to participate in goal setting Time For Goal Achievement: 09/16/19 Potential to Achieve Goals: Fair  OT Frequency: Min 2X/week   Barriers to D/C:            Co-evaluation              AM-PAC OT "6 Clicks" Daily Activity     Outcome Measure Help from another person eating meals?: A Lot Help from another person taking care of personal grooming?: Total Help from another person toileting, which includes using toliet, bedpan, or urinal?: Total Help from another person bathing (including washing, rinsing, drying)?: Total Help from another person to put on and taking off regular upper body clothing?: Total Help from another person to put on and taking off regular  lower body clothing?: Total 6 Click Score: 7   End of Session    Activity Tolerance: Patient tolerated treatment well Patient left: in bed;with call bell/phone within reach;with bed alarm set  OT Visit Diagnosis: Hemiplegia and hemiparesis;Other symptoms and signs involving the nervous system (R29.898) Hemiplegia - Right/Left: Left Hemiplegia - dominant/non-dominant: Non-Dominant Hemiplegia - caused by: Cerebral infarction                [id number] OT Time Calculation (min): 15 min Charges:  OT General Charges $OT Visit: 1 Visit OT Treatments $Self Care/Home Management : 8-22 mins  Nestor Lewandowsky, OTR/L Acute Rehabilitation Services Pager: [phone] Office: [phone]  [name] 09/09/2019, 3:21 PM

## 2019-09-09 NOTE — TOC Progression Note (Signed)
Transition of Care [facility]) - Progression Note    Patient Details  Name: [name] MRN: [id number] Date of Birth: 1956-01-01  [facility] of [facility] CM/SW [place], LCSW Phone Number: 09/09/2019, 8:32 AM  Clinical Narrative:    CSW reaching out to [facility] for bed availability. They were unable to verify patient's insurance so they are checking into it before being able to obtain insurance approval for patient.    Expected Discharge Plan: [place] Barriers to Discharge: Continued Medical Work up, SNF Pending bed offer  Expected Discharge Plan and Services Expected Discharge Plan: [place]   Discharge Planning Services: CM Consult   Living arrangements for the past 2 months: Single Family Home                                       Social Determinants of Health (SDOH) Interventions    Readmission Risk Interventions No flowsheet data found.

## 2019-09-09 NOTE — Progress Notes (Signed)
Physical Therapy Treatment Patient Details Name: [name] MRN: [id number] DOB: [birth year] Today's Date: 09/09/2019    History of Present Illness [name] is a 63 y.o. year old male with medical history significant for hypertension and previous stroke with some residual deficit who presented on 08/26/2019 with worsening confusion and several falls at home x1 day and was found to have hypertensive emergency with large occipital stroke.    PT Comments    Pt was seen for mobility and strength training, and was quite lethargic but also irritable about being moved.  He is still very appropriate to go to rehab.  Follow up acutely with him to work on OOB, to increase active use of extremities and to encourage him to try standing as he will allow, whether with stedy or two person help on a walker.  Follow Up Recommendations  SNF     Equipment Recommendations  Hospital bed;Wheelchair (measurements PT);Wheelchair cushion (measurements PT);Other (comment)    Recommendations for Other Services       Precautions / Restrictions Precautions Precautions: Fall Precaution Comments: fell in ED Restrictions Weight Bearing Restrictions: No    Mobility  Bed Mobility Overal bed mobility: Needs Assistance Bed Mobility: (scooting to reposition)           General bed mobility comments: pt actively resisted and then yelled at PT for moving him  Transfers                 General transfer comment: pt refused OOB  Ambulation/Gait                 Stairs             Wheelchair Mobility    Modified Rankin (Stroke Patients Only)       Balance                                            Cognition Arousal/Alertness: Lethargic Behavior During Therapy: Flat affect Overall Cognitive Status: Impaired/Different from baseline Area of Impairment: Following commands;Awareness;Attention                 Orientation Level:  Situation;Time Current Attention Level: Selective   Following Commands: Follows one step commands inconsistently;Follows one step commands with increased time Safety/Judgement: Decreased awareness of safety;Decreased awareness of deficits Awareness: Intellectual Problem Solving: Slow processing;Requires tactile cues;Requires verbal cues General Comments: pt declined to get OOB      Exercises General Exercises - Lower Extremity Ankle Circles/Pumps: PROM;5 reps Heel Slides: AAROM;10 reps Hip ABduction/ADduction: AAROM;10 reps Straight Leg Raises: AAROM;10 reps Hip Flexion/Marching: AAROM;10 reps    General Comments General comments (skin integrity, edema, etc.): irritable today      Pertinent Vitals/Pain Pain Assessment: Faces Faces Pain Scale: No hurt    Home Living                      Prior Function            PT Goals (current goals can now be found in the care plan section) Acute Rehab PT Goals Patient Stated Goal: none stated Progress towards PT goals: Progressing toward goals    Frequency    Min 3X/week      PT Plan Current plan remains appropriate    Co-evaluation              AM-PAC PT "6  Clicks" Mobility   Outcome Measure  Help needed turning from your back to your side while in a flat bed without using bedrails?: A Lot Help needed moving from lying on your back to sitting on the side of a flat bed without using bedrails?: A Lot Help needed moving to and from a bed to a chair (including a wheelchair)?: A Lot Help needed standing up from a chair using your arms (e.g., wheelchair or bedside chair)?: A Lot Help needed to walk in hospital room?: Total Help needed climbing 3-5 steps with a railing? : Total 6 Click Score: 10    End of Session Equipment Utilized During Treatment: Oxygen Activity Tolerance: Patient limited by fatigue;Patient limited by lethargy Patient left: in bed;with call bell/phone within reach;with bed alarm  set Nurse Communication: Mobility status PT Visit Diagnosis: Other abnormalities of gait and mobility (R26.89);Other symptoms and signs involving the nervous system (R29.898);History of falling (Z91.81);Repeated falls (R29.6)     Time: [id number] PT Time Calculation (min) (ACUTE ONLY): 24 min  Charges:  $Therapeutic Exercise: 8-22 mins $Therapeutic Activity: 8-22 mins                   Ramond Dial 09/09/2019, 1:11 PM   Mee Hives, PT MS Acute Rehab Dept. Number: [place] [facility] [place]

## 2019-09-10 LAB — CBC
HCT: 54.3 % — ABNORMAL HIGH (ref 39.0–52.0)
Hemoglobin: 17.2 g/dL — ABNORMAL HIGH (ref 13.0–17.0)
MCH: 27.8 pg (ref 26.0–34.0)
MCHC: 31.7 g/dL (ref 30.0–36.0)
MCV: 87.9 fL (ref 80.0–100.0)
Platelets: 272 10*3/uL (ref 150–400)
RBC: 6.18 MIL/uL — ABNORMAL HIGH (ref 4.22–5.81)
RDW: 14 % (ref 11.5–15.5)
WBC: 11.9 10*3/uL — ABNORMAL HIGH (ref 4.0–10.5)
nRBC: 0 % (ref 0.0–0.2)

## 2019-09-10 LAB — BASIC METABOLIC PANEL
Anion gap: 14 (ref 5–15)
BUN: 23 mg/dL (ref 8–23)
CO2: 22 mmol/L (ref 22–32)
Calcium: 9.1 mg/dL (ref 8.9–10.3)
Chloride: 111 mmol/L (ref 98–111)
Creatinine, Ser: 1.01 mg/dL (ref 0.61–1.24)
GFR calc Af Amer: >60 mL/min (ref >60)
GFR calc non Af Amer: >60 mL/min (ref >60)
Glucose, Bld: 94 mg/dL (ref 70–99)
Potassium: 4.6 mmol/L (ref 3.5–5.1)
Sodium: 147 mmol/L — ABNORMAL HIGH (ref 135–145)

## 2019-09-10 MED ORDER — LISINOPRIL 10 MG PO TABS
10.0000 mg | ORAL_TABLET | Freq: Every day | ORAL | Status: DC
  Administered 2019-09-10 – 2019-09-11 (×2): 10 mg via ORAL
  Filled 2019-09-10 (×3): qty 1

## 2019-09-10 NOTE — Progress Notes (Signed)
[facility] PROGRESS NOTE  [name] [id number] DOB: 07-26-1956 DOA: 08/26/2019 PCP: Alroy Dust, L.[name], MD  Brief summary   63 y.o.year old malewith medical history significant for hypertension and previous stroke with some residual deficit who presented on 12/10/2020with worsening confusion and several falls at home x1 day and was found to have hypertensive emergency with large occipital stroke. In ED, initially noted to be hypertensive with blood pressure 230/125 and creatinine of 1.3. Upon evaluation in the ED patient was intermittently following commands with noticeable facial droop and slurred speech per family consistent with his baseline of previous stroke. CT head initially revealed age-indeterminate infarct. Given persistently high blood pressure and intermittent confusion, neurology was consulted who recommended permissive hypertension and ordered MRI which showed a large occipital lobe stroke. Hospital course complicated by continued confusion.CT head done and showed no acute change from prior stroke.   09/10/19 pt is alert but minimally participatory in interview/exam. Denies pain or other complaints. Discharge is still pending placement. WBC trending down, sodium and Hgb trending down but slowly, encourage hydration. Restarting lisinopril since kidneys have rebounded. Trend labs in AM.   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 Found to be cocaine positive. Likely in the setting of hypertensive emergency and substance abuse, likely thrombotic could also be embolic. 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 Preserved EF on TTE without PFO. A1c 6.2, LDL 107, UDS positive for cocaine -Neurology f/u with [facility] in 4 weeks -Cardiology will arrange30 day cardiac event monitoring for Afib rule out -Aspirin 325 mg p.o., plavix 75 mg qd for DAPT x 3 months, then plavix alone per neurology  Decreased  PO intake -Encourage ensure or trial other PO intake per patient preference, aspiration precautions. ?PEG. Palliative care consulted for goals of care conversation.    Hypernatremia -Likely secondary to decreased PO intake. On gentle iv fluids. monitor BMP daily   AKI on CKD stage 3b  -Last creatinine from 2014 was 1.14. Cr 1.32 on admission, has ranged from 1.3-1.6 this admission, 1.01 today and GFR >60 -Continue to monitor BMP on IV fluid -Stopped hydrochlorothiazide and lisinopril - permissive hypertension, BP ok for now but will restart lisinopril now that renal function has improved, consider restarting HCTZ when hydrating well and sodium looking better (though thiazide might help hypernatremia, wouldn't want to dehydrate him further).   Hypokalemia -Replaced, trend  Leukocytosis -Could be secondary to contracture (increase in Hgb and platelets as well) vs sepsis -Monitor CBC, obtain blood culture for T>100.4  -IVF  -Improved overnight without antibiotic  Acute metabolic encephalopathy -Will likely be prone to bouts of confusion given large PCA stroke  -Repeat CT head shows no evolution or worsening of known disease after fall in ED on admission -Stable   Essential HTN  -Allowed permissive hypertension, okay if less than 220/120 over the next 5 to 7 days post acute CVA -Stopped lisinopril and hydrochlorothiazide in setting of AKI.  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 Needs SNF at discharge  Code Status: full Family Communication: none today  Disposition Plan: needs SNF,  awaiting placement    Consultants:  Neurology  Palliative care  Procedures:  none  Antibiotics: Anti-infectives (From admission, onward)   None      (indicate start date, and stop date if known)  HPI/Subjective: No distress. No acute dyspnea. No acute issues overnight.   Objective: Vitals:   09/09/19 2300 09/10/19 0746  BP: (!) 164/98 (!) 155/91  Pulse: 80 78  Resp: 16   Temp: 98.7 F (37.1 C) 98.2 F (36.8 C)  SpO2: 100% 99%    Intake/Output Summary (Last 24 hours) at 09/10/2019 1514 Last data filed at 09/10/2019 0400 Gross per 24 hour  Intake --  Output 400 ml  Net -400 ml   Filed Weights   09/04/19 0847  Weight: 85 kg    Exam:   General:  No distress   Cardiovascular: S1S1 RRR  Respiratory: CTA BL  Abdomen: soft,. nt   Musculoskeletal: no leg edema    Data Reviewed: Basic Metabolic Panel: Recent Labs  Lab 09/06/19 0235 09/07/19 0228 09/08/19 0206 09/10/19 0148  NA 148* 147* 148* 147*  K 3.9 3.4* 3.8 4.6  CL 105 107 109 111  CO2 28 27 28 22   GLUCOSE 136* 155* 132* 94  BUN 56* 76* 50* 23  CREATININE 1.61* 1.91* 1.24 1.01  CALCIUM 9.6 9.3 9.0 9.1   Liver Function Tests: No results for input(s): AST, ALT, ALKPHOS, BILITOT, PROT, ALBUMIN in the last 168 hours. No results for input(s): LIPASE, AMYLASE in the last 168 hours. No results for input(s): AMMONIA in the last 168 hours. CBC: Recent Labs  Lab 09/06/19 0235 09/07/19 0228 09/08/19 0206 09/10/19 0148  WBC 21.3* 18.5* 12.8* 11.9*  HGB 19.1* 18.3* 17.6* 17.2*  HCT 58.3* 56.5* 55.5* 54.3*  MCV 84.1 84.7 86.2 87.9  PLT 373 322 341 272   Cardiac Enzymes: No results for input(s): CKTOTAL, CKMB, CKMBINDEX, TROPONINI in the last 168 hours. BNP (last 3 results) No results for input(s): BNP in the last 8760 hours.  ProBNP (last 3 results) No results for input(s): PROBNP in the last 8760 hours.  CBG: Recent Labs  Lab 09/08/19 1943  GLUCAP 140*    No results found for  this or any previous visit (from the past 240 hour(s)).   Studies: No results found.  Scheduled Meds: .  stroke: mapping our early stages of recovery book   Does not apply Once  . amLODipine  10 mg Oral Daily  . aspirin  300 mg Rectal Daily   Or  . aspirin  325 mg Oral Daily  . atorvastatin  80 mg Oral q1800  . clopidogrel  75 mg Oral Daily  . feeding supplement (ENSURE ENLIVE)  237 mL Oral TID BM   Continuous Infusions:   Principal Problem:   Acute ischemic right PCA stroke (HCC) Active Problems:   HTN (hypertension)   Tobacco abuse   Cocaine abuse ([place])   AKI (acute kidney injury) ([place])   Hyperlipidemia   Confusion   Hypokalemia    Time spent: >25 minutes     [facility]  09/10/2019, 3:14 PM  LOS: 14 days

## 2019-09-10 NOTE — Progress Notes (Signed)
[facility] PROGRESS NOTE  [name] DOB: 1956-01-09 DOA: 08/26/2019 PCP: Alroy Dust, L.[name], MD  Brief summary   63 y.o.year old malewith medical history significant for hypertension and previous stroke with some residual deficit who presented on 12/10/2020with worsening confusion and several falls at home x1 day and was found to have hypertensive emergency with large occipital stroke. In ED, initially noted to be hypertensive with blood pressure 230/125 and creatinine of 1.3. Upon evaluation in the ED patient was intermittently following commands with noticeable facial droop and slurred speech per family consistent with his baseline of previous stroke. CT head initially revealed age-indeterminate infarct. Given persistently high blood pressure and intermittent confusion, neurology was consulted who recommended permissive hypertension and ordered MRI which showed a large occipital lobe stroke. Hospital course complicated by continued confusion.CT head done and showed no acute change from prior stroke.   09/09/19 pt is alert but minimally participatory in interview/exam. Denies pain or other complaints.   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 Found to be cocaine positive. Likely in the setting of hypertensive emergency and substance abuse, likely thrombotic could also be embolic. 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 Preserved EF on TTE without PFO. A1c 6.2, LDL 107, UDS positive for cocaine -Neurology f/u with [facility] in 4 weeks -Cardiology will arrange30 day cardiac event monitoring for Afib rule out -Aspirin 325 mg p.o., plavix 75 mg qd for DAPT x 3 months, then plavix alone per neurology  Decreased PO intake -Encourage  ensure or trial other PO intake per patient preference, aspiration precautions. ?PEG. Palliative care consulted for goals of care conversation.    Hypernatremia -Likely secondary to decreased PO intake. On gentle iv fluids. monitor BMP daily   AKI on CKD stage 3b  -Last creatinine from 2014 was 1.14. Cr 1.32 on admission, has ranged from 1.3-1.6 this admission  -Continue to monitor BMP on IV fluid -Stopped hydrochlorothiazide and lisinopril - permissive hypertension, BP ok for now   Hypokalemia -Replaced, trend  Leukocytosis -Could be secondary to contracture (increase in Hgb and platelets as well) vs sepsis -Monitor CBC, obtain blood culture for T>100.4  -IVF  -Improved overnight without antibiotic  Acute metabolic encephalopathy -Will likely be prone to bouts of confusion given large PCA stroke  -Repeat CT head shows no evolution or worsening of known disease after fall in ED on admission -Stable   Essential HTN  -Allowed permissive hypertension, okay if less than 220/120 over the next 5 to 7 days post acute CVA -Stopped lisinopril and hydrochlorothiazide in setting of AKI.  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 Needs SNF at discharge  Code Status: full Family Communication: none today  Disposition Plan: needs SNF, awaiting placement    Consultants:  Neurology  Palliative care  Procedures:  none  Antibiotics: Anti-infectives (From admission, onward)   None      (indicate start date, and stop date if known)  HPI/Subjective: No distress. No acute  dyspnea. No  acute issues overnight.   Objective: Vitals:   09/09/19 1551 09/09/19 2300  BP: (!) 171/93 (!) 164/98  Pulse: 78 80  Resp: 16 16  Temp: 98.3 F (36.8 C) 98.7 F (37.1 C)  SpO2: 99% 100%    Intake/Output Summary (Last 24 hours) at 09/10/2019 0728 Last data filed at 09/10/2019 0400 Gross per 24 hour  Intake --  Output 400 ml  Net -400 ml   Filed Weights   09/04/19 0847  Weight: 85 kg    Exam:   General:  No distress   Cardiovascular: S1S1 RRR  Respiratory: CTA BL  Abdomen: soft,. nt   Musculoskeletal: no leg edema    Data Reviewed: Basic Metabolic Panel: Recent Labs  Lab 09/06/19 0235 09/07/19 0228 09/08/19 0206 09/10/19 0148  NA 148* 147* 148* 147*  K 3.9 3.4* 3.8 4.6  CL 105 107 109 111  CO2 28 27 28 22   GLUCOSE 136* 155* 132* 94  BUN 56* 76* 50* 23  CREATININE 1.61* 1.91* 1.24 1.01  CALCIUM 9.6 9.3 9.0 9.1   Liver Function Tests: No results for input(s): AST, ALT, ALKPHOS, BILITOT, PROT, ALBUMIN in the last 168 hours. No results for input(s): LIPASE, AMYLASE in the last 168 hours. No results for input(s): AMMONIA in the last 168 hours. CBC: Recent Labs  Lab 09/06/19 0235 09/07/19 0228 09/08/19 0206 09/10/19 0148  WBC 21.3* 18.5* 12.8* 11.9*  HGB 19.1* 18.3* 17.6* 17.2*  HCT 58.3* 56.5* 55.5* 54.3*  MCV 84.1 84.7 86.2 87.9  PLT 373 322 341 272   Cardiac Enzymes: No results for input(s): CKTOTAL, CKMB, CKMBINDEX, TROPONINI in the last 168 hours. BNP (last 3 results) No results for input(s): BNP in the last 8760 hours.  ProBNP (last 3 results) No results for input(s): PROBNP in the last 8760 hours.  CBG: Recent Labs  Lab 09/08/19 1943  GLUCAP 140*    No results found for this or any previous visit (from the past 240 hour(s)).   Studies: No results found.  Scheduled Meds: .  stroke: mapping our early stages of recovery book   Does not apply Once  . amLODipine  10 mg Oral Daily  . aspirin  300 mg Rectal Daily   Or  .  aspirin  325 mg Oral Daily  . atorvastatin  80 mg Oral q1800  . clopidogrel  75 mg Oral Daily  . feeding supplement (ENSURE ENLIVE)  237 mL Oral TID BM   Continuous Infusions:   Principal Problem:   Acute ischemic right PCA stroke (HCC) Active Problems:   HTN (hypertension)   Tobacco abuse   Cocaine abuse ([place])   AKI (acute kidney injury) ([place])   Hyperlipidemia   Confusion   Hypokalemia    Time spent: >25 minutes     [name]  [facility]  09/10/2019, 7:28 AM  LOS: 14 days

## 2019-09-11 MED ORDER — SENNOSIDES-DOCUSATE SODIUM 8.6-50 MG PO TABS
1.0000 | ORAL_TABLET | Freq: Two times a day (BID) | ORAL | Status: DC
  Administered 2019-09-11 – 2019-09-16 (×8): 1 via ORAL
  Filled 2019-09-11 (×10): qty 1

## 2019-09-11 MED ORDER — LISINOPRIL 20 MG PO TABS
20.0000 mg | ORAL_TABLET | Freq: Every day | ORAL | Status: DC
  Administered 2019-09-14 – 2019-09-15 (×2): 20 mg via ORAL
  Filled 2019-09-11 (×3): qty 1

## 2019-09-11 NOTE — Progress Notes (Signed)
Patient ID: [name], male   DOB: [birth year], 63 y.o.   MRN: [id number]  PROGRESS NOTE    [name]  [id number] DOB: March 25, 1956 DOA: 08/26/2019 PCP: Alroy Dust, L.[name], MD  Outpatient Specialists:  Brief Narrative:  63 y.o.year old malewith medical history significant for hypertension and previous stroke with some residual deficit who presented on 12/10/2020with worsening confusion and several falls at home x1 day and was found to have hypertensive emergency with large occipital stroke. In ED, initially noted to be hypertensive with blood pressure 230/125 and creatinine of 1.3. Upon evaluation in the ED patient was intermittently following commands with noticeable facial droop and slurred speech per family consistent with his baseline of previous stroke. CT head initially revealed age-indeterminate infarct. Given persistently high blood pressure and intermittent confusion, neurology was consulted who recommended permissive hypertension and ordered MRI which showed a large occipital lobe stroke. Hospital course complicated by continued confusion.CT head done and showed no acute change from prior stroke. Palliative care consult, wife still wants all medical interventions done.  09/11/19 Patient is sleeping. Awakens and minimally responsive during interview and exam today. He is eating minimally still.  Assessment & Plan:  Large PCA territory infarct on the right, stable -History of previous stroke. Found to be cocaine positive. Likely in the setting of hypertensive emergency and substance abuse, likely thrombotic could also be embolic. 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 Preserved EF on TTE without PFO. A1c 6.2, LDL 107, UDS positive for cocaine -Neurology f/u with [facility] in 4 weeks -Cardiology will arrange30 day cardiac event monitoring for Afib rule out -Aspirin 325 mg p.o., plavix 75 mg qd for DAPT x 3 months, then plavix alone per neurology Continue    Decreased PO intake -Encourage ensure or trial other PO intake per patient preference, aspiration precautions. ?PEG. Palliative care consulted for goals of care conversation. Wife still wants all medical interventions including PEG placement if needed.  Hypernatremia -Likely secondary to decreased PO intake. On gentle iv fluids. monitor BMPdaily. Na is 147 today down from 148 yesterday.  AKIonCKD stage 3b -Last creatinine from 2014 was 1.14. Cr1.32 on admission, has ranged from 1.3-1.6 this admission, 1.01 today and GFR >60 -Continue to monitor BMP on IV fluid -Stopped hydrochlorothiazide and lisinopril - permissive hypertension, BP ok for now but will increase lisinopril now that renal function has improved and BP is still poorly controlled, consider restarting HCTZ when hydrating well and sodium looking better (though thiazide might help hypernatremia, wouldn't want to dehydrate him further).   Hypokalemia -Replaced, trend---normal today  Leukocytosis -Could be secondary to contracture (increase in Hgb and platelets as well) vs sepsis -Monitor CBC, obtain blood culture for T>100.4  12.8-->11.9 this am.  Acute metabolic encephalopathy -Will likely be prone to bouts of confusion given large PCA stroke  -Repeat CT head shows no evolution or worsening of known disease after fall in ED on admission -Stable   Essential HTN  -Allowed permissive hypertension, okay if less than 220/120 over the next 5 to 7 days post acute CVA -Stopped lisinopril and hydrochlorothiazide in setting of AKI. Started Norvasc--added back lisinopril and increased now that 10 days post CVA to improve control.  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 Needs SNF at discharge  Code Status: full Family Communication: none today  Disposition Plan: needs SNF, awaiting placement    DVT prophylaxis: ASP + Plavix Code Status: Full - all medical interventions desired Family Communication:  Disposition Plan: SNF for now   Consultants:   Neurology  Palliative care  PT  OT  SLP  Procedures:   none  Antimicrobials:   none   Subjective: No issues overnight. No distress.  Objective: Vitals:   09/10/19 0746 09/10/19 1700 09/10/19 2230 09/11/19 0825  BP: (!) 155/91 (!) 160/90 (!) 160/98 (!) 141/100  Pulse: 78 80 97 86  Resp:  18 18   Temp: 98.2 F (36.8 C) 98.4 F (36.9 C) 98.5 F (36.9 C) 97.7 F (36.5 C)  TempSrc: Oral Oral Axillary Axillary  SpO2: 99% 99% 100% 100%  Weight:      Height:        Intake/Output Summary (Last 24 hours) at 09/11/2019 1002 Last data filed at 09/11/2019 0800 Gross per 24 hour  Intake 20 ml  Output --  Net 20 ml   Filed Weights   09/04/19 0847  Weight: 85 kg    Examination:  General exam: Appears calm and comfortable  Respiratory system: Clear to auscultation. Respiratory effort normal. Cardiovascular system: S1 & S2 heard, RRR. No JVD, murmurs, rubs, gallops or clicks. No pedal edema. Gastrointestinal system: Abdomen is nondistended, soft and nontender. No organomegaly or masses felt. Normal bowel sounds heard. Central nervous system: Alert and oriented. No focal neurological deficits. Extremities: Symmetric 5 x 5 power. Skin: No rashes, lesions or ulcers Psychiatry: Judgement and insight appear normal. Mood & affect appropriate.     Data Reviewed: I have personally reviewed following labs and imaging studies  CBC: Recent Labs  Lab 09/06/19 0235 09/07/19 0228 09/08/19 0206 09/10/19 0148  WBC 21.3* 18.5* 12.8* 11.9*  HGB 19.1* 18.3* 17.6*  17.2*  HCT 58.3* 56.5* 55.5* 54.3*  MCV 84.1 84.7 86.2 87.9  PLT 373 322 341 Q000111Q   Basic Metabolic Panel: Recent Labs  Lab 09/06/19 0235 09/07/19 0228 09/08/19 0206 09/10/19 0148  NA 148* 147* 148* 147*  K 3.9 3.4* 3.8 4.6  CL 105 107 109 111  CO2 28 27 28 22   GLUCOSE 136* 155* 132* 94  BUN 56* 76* 50* 23  CREATININE 1.61* 1.91* 1.24 1.01  CALCIUM 9.6 9.3 9.0 9.1   GFR: Estimated Creatinine Clearance: 81.9 mL/min (by C-G formula based on SCr of 1.01 mg/dL). Liver Function Tests: No results for input(s): AST, ALT, ALKPHOS, BILITOT, PROT, ALBUMIN in the last 168 hours. No results for input(s): LIPASE, AMYLASE in the last 168 hours. No results for input(s): AMMONIA in the last 168 hours. Coagulation Profile: No results for input(s): INR, PROTIME in the last 168 hours. Cardiac Enzymes: No results for input(s): CKTOTAL, CKMB, CKMBINDEX, TROPONINI in the last 168 hours. BNP (last 3 results) No results for input(s): PROBNP in the last 8760 hours. HbA1C: No results for input(s): HGBA1C in the last 72 hours. CBG: Recent Labs  Lab 09/08/19 1943  GLUCAP 140*   Lipid Profile: No results for input(s): CHOL, HDL, LDLCALC, TRIG, CHOLHDL, LDLDIRECT in the last 72 hours. Thyroid Function Tests: No results for input(s): TSH, T4TOTAL, FREET4, T3FREE, THYROIDAB in the last 72 hours. Anemia Panel: No results for input(s): VITAMINB12, FOLATE, FERRITIN, TIBC, IRON, RETICCTPCT in the last 72 hours. Urine analysis:    Component Value Date/Time  COLORURINE YELLOW 08/26/2019 1933   APPEARANCEUR CLEAR 08/26/2019 1933   LABSPEC 1.024 08/26/2019 1933   PHURINE 5.0 08/26/2019 [place] 08/26/2019 [place] NEGATIVE 08/26/2019 [place] 08/26/2019 1933   KETONESUR 20 (A) 08/26/2019 1933   PROTEINUR NEGATIVE 08/26/2019 1933   UROBILINOGEN 0.2 02/04/2013 1315   NITRITE NEGATIVE 08/26/2019 [place] 08/26/2019 1933   Sepsis  Labs: @LABRCNTIP (procalcitonin:4,lacticidven:4)  )No results found for this or any previous visit (from the past 240 hour(s)).       Radiology Studies: No results found.      Scheduled Meds: .  stroke: mapping our early stages of recovery book   Does not apply Once  . amLODipine  10 mg Oral Daily  . aspirin  300 mg Rectal Daily   Or  . aspirin  325 mg Oral Daily  . atorvastatin  80 mg Oral q1800  . clopidogrel  75 mg Oral Daily  . feeding supplement (ENSURE ENLIVE)  237 mL Oral TID BM  . lisinopril  10 mg Oral Daily   Continuous Infusions:   LOS: 15 days    Time spent: 74    Donnamae Jude, MD [facility] Pager [phone]  If 7PM-7AM, please contact night-coverage [email or web address] Password [email or web address] 09/11/2019, 10:02 AM

## 2019-09-12 DIAGNOSIS — N179 Acute kidney failure, unspecified: Secondary | ICD-10-CM

## 2019-09-12 LAB — BASIC METABOLIC PANEL WITH GFR
Anion gap: 11 (ref 5–15)
BUN: 28 mg/dL — ABNORMAL HIGH (ref 8–23)
CO2: 26 mmol/L (ref 22–32)
Calcium: 9.3 mg/dL (ref 8.9–10.3)
Chloride: 109 mmol/L (ref 98–111)
Creatinine, Ser: 0.97 mg/dL (ref 0.61–1.24)
GFR calc Af Amer: >60 mL/min
GFR calc non Af Amer: >60 mL/min
Glucose, Bld: 132 mg/dL — ABNORMAL HIGH (ref 70–99)
Potassium: 4.2 mmol/L (ref 3.5–5.1)
Sodium: 146 mmol/L — ABNORMAL HIGH (ref 135–145)

## 2019-09-12 MED ORDER — SODIUM CHLORIDE 0.45 % IV SOLN: INTRAVENOUS | Status: AC

## 2019-09-12 NOTE — Progress Notes (Signed)
Pt refuses to drink fluids and take bites of food when this RN encourages.

## 2019-09-12 NOTE — Progress Notes (Addendum)
VAST consulted for PIV placement. Pt allowed this RN to attempt PIV placement in R arm which was unsuccessful. Asked pt if I could look at his left arm. He questioned, "Why"? Explained he needs IV fluids to get better. He agreed. Upon cleaning site for IV placement, he jerked his arm away and asked what I was doing. Explained again that he needed an IV for fluids so that he could get better and he yelled "No". Attempted to remind patient why he needs IV access and he continued to refuse allowing nurse to clean his arm to place IV.  Notified pt's nurse and asked her to place IVT consult if patient changes mind.

## 2019-09-12 NOTE — Progress Notes (Signed)
PROGRESS NOTE    [name]  [id number] DOB: 07/27/1956 DOA: 08/26/2019 PCP: Alroy Dust, L.[name], MD    Brief Narrative:  63 y.o.year old malewith medical history significant for hypertension and previous stroke with some residual deficit who presented on 12/10/2020with worsening confusion and several falls at home x1 day and was found to have hypertensive emergency with large occipital stroke. In ED, initially noted to be hypertensive with blood pressure 230/125 and creatinine of 1.3. Upon evaluation in the ED patient was intermittently following commands with noticeable facial droop and slurred speech per family consistent with his baseline of previous stroke. CT head initially revealed age-indeterminate infarct. Given persistently high blood pressure and intermittent confusion, neurology was consulted who recommended permissive hypertension and ordered MRI which showed a large occipital lobe stroke. Hospital course complicated by continued confusion and poor PO intake. Per palliative care consult, wife still wants all medical interventions done.   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 Found to be cocaine positive. Likely in the setting of hypertensive emergency and substance abuse, likely thrombotic could also be embolic. 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 Preserved EF on TTE without PFO. A1c 6.2, LDL 107, UDS positive for cocaine -[facility]urology f/u with [facility] in 4 weeks -Cardiology will arrange30 day cardiac event monitoring for Afib rule out -plavix 75 mg and ASA qd for DAPT x 3 months, then plavix alone per neurology  Poor PO intake -limited documentation, will do calorie count -?PEG. Palliative care consulted for goals of care conversation. Wife still wants all medical interventions including PEG placement if needed but wants to give the patient time to recover  Hypernatremia -Likely  secondary to decreased PO intake -no AM labs so will place order  AKIonCKD stage 3b -Last creatinine from 2014 was 1.14. Cr1.32 on admission, has ranged from 1.3-1.6 this admission -Continue to monitor BMP on IV fluid   Hypokalemia -AM labs pending  Leukocytosis -AM labs pending  Acute metabolic encephalopathy -Will likely be prone to bouts of confusion given large PCA stroke   Essential HTN  -Allowed permissive hypertension, okay if less than 220/120 over the next 5 to 7 days post acute CVA -Stopped lisinopril and hydrochlorothiazide in setting of AKI.  -Started Norvasc -added back lisinopril and increased now that 10 days post CVA to improve control.  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 -Incidentally found on imaging for stroke work-up -Histologic sampling should be considered due to degree of overlap of imaging features between benign and malignant parotid tumors by ENT --defer to the outpatient setting   Code Status: full Family Communication: none today  Disposition Plan: needs SNF, awaiting placement    Code Status: Full - all medical interventions desired Family Communication:  Disposition Plan: SNF when medically ready   Consultants:   Neurology  Palliative care    Subjective: Tray at bedside and patient not interested in eating  Objective: Vitals:   09/11/19 0825 09/11/19 1700 09/11/19 2234 09/12/19 0749  BP: (!) 141/100 (!) 145/95 (!) 151/96 (!) 157/98  Pulse: 86 85 94 89  Resp:   18 16  Temp: 97.7 F (36.5 C) 98 F (36.7 C) 98.8 F (37.1 C) 98.5 F (36.9 C)  TempSrc: Axillary Axillary Axillary Axillary  SpO2: 100% 100% 99% 100%  Weight:  Height:        Intake/Output Summary (Last 24 hours) at 09/12/2019 1137 Last data filed at 09/11/2019 2100 Gross per 24 hour  Intake --   Output 750 ml  Net -750 ml   Filed Weights   09/04/19 0847  Weight: 85 kg    Examination:  General exam: chronically ill appearing, lips dry Respiratory system: no increased work of breathing Cardiovascular system: rrr Gastrointestinal system: +BS, soft, NT Psychiatry: poor eye contact, withdrawn    Data Reviewed: I have personally reviewed following labs and imaging studies  CBC: Recent Labs  Lab 09/06/19 0235 09/07/19 0228 09/08/19 0206 09/10/19 0148  WBC 21.3* 18.5* 12.8* 11.9*  HGB 19.1* 18.3* 17.6* 17.2*  HCT 58.3* 56.5* 55.5* 54.3*  MCV 84.1 84.7 86.2 87.9  PLT 373 322 341 Q000111Q   Basic Metabolic Panel: Recent Labs  Lab 09/06/19 0235 09/07/19 0228 09/08/19 0206 09/10/19 0148  NA 148* 147* 148* 147*  K 3.9 3.4* 3.8 4.6  CL 105 107 109 111  CO2 28 27 28 22   GLUCOSE 136* 155* 132* 94  BUN 56* 76* 50* 23  CREATININE 1.61* 1.91* 1.24 1.01  CALCIUM 9.6 9.3 9.0 9.1   GFR: Estimated Creatinine Clearance: 81.9 mL/min (by C-G formula based on SCr of 1.01 mg/dL). Liver Function Tests: No results for input(s): AST, ALT, ALKPHOS, BILITOT, PROT, ALBUMIN in the last 168 hours. No results for input(s): LIPASE, AMYLASE in the last 168 hours. No results for input(s): AMMONIA in the last 168 hours. Coagulation Profile: No results for input(s): INR, PROTIME in the last 168 hours. Cardiac Enzymes: No results for input(s): CKTOTAL, CKMB, CKMBINDEX, TROPONINI in the last 168 hours. BNP (last 3 results) No results for input(s): PROBNP in the last 8760 hours. HbA1C: No results for input(s): HGBA1C in the last 72 hours. CBG: Recent Labs  Lab 09/08/19 1943  GLUCAP 140*   Lipid Profile: No results for input(s): CHOL, HDL, LDLCALC, TRIG, CHOLHDL, LDLDIRECT in the last 72 hours. Thyroid Function Tests: No results for input(s): TSH, T4TOTAL, FREET4, T3FREE, THYROIDAB in the last 72 hours. Anemia Panel: No results for input(s): VITAMINB12, FOLATE, FERRITIN, TIBC, IRON,  RETICCTPCT in the last 72 hours. Urine analysis:    Component Value Date/Time   COLORURINE YELLOW 08/26/2019 1933   APPEARANCEUR CLEAR 08/26/2019 1933   LABSPEC 1.024 08/26/2019 1933   PHURINE 5.0 08/26/2019 1933   GLUCOSEU NEGATIVE 08/26/2019 [place] NEGATIVE 08/26/2019 [place] NEGATIVE 08/26/2019 1933   KETONESUR 20 (A) 08/26/2019 1933   PROTEINUR NEGATIVE 08/26/2019 1933   UROBILINOGEN 0.2 02/04/2013 1315   NITRITE NEGATIVE 08/26/2019 1933   LEUKOCYTESUR NEGATIVE 08/26/2019 1933        Radiology Studies: No results found.      Scheduled Meds: .  stroke: mapping our early stages of recovery book   Does not apply Once  . amLODipine  10 mg Oral Daily  . aspirin  300 mg Rectal Daily   Or  . aspirin  325 mg Oral Daily  . atorvastatin  80 mg Oral q1800  . clopidogrel  75 mg Oral Daily  . feeding supplement (ENSURE ENLIVE)  237 mL Oral TID BM  . lisinopril  20 mg Oral Daily  . senna-docusate  1 tablet Oral BID   Continuous Infusions:   LOS: 16 days    Time spent: [place], DO [facility]   If 7PM-7AM, please contact night-coverage [email or web address] Password [facility] 09/12/2019,  11:37 AM

## 2019-09-13 LAB — BASIC METABOLIC PANEL
Anion gap: 13 (ref 5–15)
BUN: 27 mg/dL — ABNORMAL HIGH (ref 8–23)
CO2: 27 mmol/L (ref 22–32)
Calcium: 9.4 mg/dL (ref 8.9–10.3)
Chloride: 108 mmol/L (ref 98–111)
Creatinine, Ser: 0.98 mg/dL (ref 0.61–1.24)
GFR calc Af Amer: >60 mL/min (ref >60)
GFR calc non Af Amer: >60 mL/min (ref >60)
Glucose, Bld: 90 mg/dL (ref 70–99)
Potassium: 3.8 mmol/L (ref 3.5–5.1)
Sodium: 148 mmol/L — ABNORMAL HIGH (ref 135–145)

## 2019-09-13 LAB — CBC
HCT: 54 % — ABNORMAL HIGH (ref 39.0–52.0)
Hemoglobin: 17.3 g/dL — ABNORMAL HIGH (ref 13.0–17.0)
MCH: 27.5 pg (ref 26.0–34.0)
MCHC: 32 g/dL (ref 30.0–36.0)
MCV: 85.9 fL (ref 80.0–100.0)
Platelets: 377 10*3/uL (ref 150–400)
RBC: 6.29 MIL/uL — ABNORMAL HIGH (ref 4.22–5.81)
RDW: 14.1 % (ref 11.5–15.5)
WBC: 12.4 10*3/uL — ABNORMAL HIGH (ref 4.0–10.5)
nRBC: 0 % (ref 0.0–0.2)

## 2019-09-13 MED ORDER — PRO-STAT SUGAR FREE PO LIQD
30.0000 mL | Freq: Two times a day (BID) | ORAL | Status: DC
  Administered 2019-09-13 – 2019-09-16 (×5): 30 mL via ORAL
  Filled 2019-09-13 (×5): qty 30

## 2019-09-13 MED ORDER — ADULT MULTIVITAMIN W/MINERALS CH
1.0000 | ORAL_TABLET | Freq: Every day | ORAL | Status: DC
  Administered 2019-09-14: 1 via ORAL
  Filled 2019-09-13 (×3): qty 1

## 2019-09-13 MED ORDER — CARVEDILOL 3.125 MG PO TABS
3.1250 mg | ORAL_TABLET | Freq: Two times a day (BID) | ORAL | Status: DC
  Administered 2019-09-13 – 2019-09-14 (×2): 3.125 mg via ORAL
  Filled 2019-09-13 (×2): qty 1

## 2019-09-13 NOTE — Progress Notes (Signed)
Pt took several sips of water and drank 1 full Ensure. Refused any breakfast foods.

## 2019-09-13 NOTE — Progress Notes (Signed)
Called wife to discuss patient's limited PO intake and refusal of IV and IVF.  No answer so had to leave message. My fear is if patient gets PEG tube he may pull it out. Eulogio Bear DO

## 2019-09-13 NOTE — Progress Notes (Addendum)
PROGRESS NOTE    [name]  [id number] DOB: 01-Jun-1956 DOA: 08/26/2019 PCP: Alroy Dust, L.[name], MD    Brief Narrative:  63 y.o.year old malewith medical history significant for hypertension and previous stroke with some residual deficit who presented on 12/10/2020with worsening confusion and several falls at home x1 day and was found to have hypertensive emergency with large occipital stroke. In ED, initially noted to be hypertensive with blood pressure 230/125 and creatinine of 1.3. Upon evaluation in the ED patient was intermittently following commands with noticeable facial droop and slurred speech per family consistent with his baseline of previous stroke. CT head initially revealed age-indeterminate infarct. Given persistently high blood pressure and intermittent confusion, neurology was consulted who recommended permissive hypertension and ordered MRI which showed a large occipital lobe stroke. Hospital course complicated by continued confusion and poor PO intake. Per palliative care consult, wife still wants all medical interventions done.   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 Found to be cocaine positive. Likely in the setting of hypertensive emergency and substance abuse, likely thrombotic could also be embolic. 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 Preserved EF on TTE without PFO. A1c 6.2, LDL 107, UDS positive for cocaine -Neurology f/u with [facility] in 4 weeks -Cardiology will arrange30 day cardiac event monitoring for Afib rule out -plavix 75 mg and ASA qd for DAPT x 3 months, then plavix alone per neurology  Poor PO intake -limited documentation, will do calorie count -?PEG. Palliative care consulted for goals of care conversation. Wife still wants all medical interventions including PEG placement if needed but wants to give the patient time to recover -LM for wife today to  discuss -calorie count pending but nursing staff report improved PO intake  Hypernatremia -Likely secondary to decreased PO intake -IV overnight-- recheck in AM  AKIonCKD stage 3b -Last creatinine from 2014 was 1.14. Cr1.32 on admission, has ranged from 1.3-1.6 this admission -Continue to monitor BMP   Hypokalemia -repleted  Acute metabolic encephalopathy -Will likely be prone to bouts of confusion given large PCA stroke   Essential HTN  -Started Norvasc -added back lisinopril and increased now that 10 days post CVA to improve control.  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 -Incidentally found on imaging for stroke work-up -Histologic sampling should be considered due to degree of overlap of imaging features between benign and malignant parotid tumors by ENT --defer to the outpatient setting   Code Status: full Family Communication: LM for wife Disposition Plan: needs SNF, awaiting placement     Consultants:   Neurology  Palliative care    Subjective: Agreeable to IVF today  Objective: Vitals:   09/12/19 0749 09/12/19 1632 09/12/19 2305 09/13/19 0730  BP: (!) 157/98 (!) 148/98 (!) 158/97 (!) 142/94  Pulse: 89 95 88 (!) 113  Resp: 16 18 16 16   Temp: 98.5 F (36.9 C) 98.6 F (37 C) 97.8 F (36.6 C) 98.2 F (36.8 C)  TempSrc: Axillary Oral Oral Oral  SpO2: 100% 100% 100% 100%  Weight:      Height:        Intake/Output Summary (Last 24 hours) at 09/13/2019 0914 Last data filed at 09/13/2019 0500 Gross per 24 hour  Intake 120 ml  Output 800 ml  Net -680 ml   Filed Weights   09/04/19  0847  Weight: 85 kg    Examination: In bed rrr No LE edema Alert and cooperative today     Data Reviewed: I have personally reviewed following labs and imaging studies  CBC: Recent Labs  Lab  09/07/19 0228 09/08/19 0206 09/10/19 0148 09/13/19 0546  WBC 18.5* 12.8* 11.9* 12.4*  HGB 18.3* 17.6* 17.2* 17.3*  HCT 56.5* 55.5* 54.3* 54.0*  MCV 84.7 86.2 87.9 85.9  PLT 322 341 272 Q000111Q   Basic Metabolic Panel: Recent Labs  Lab 09/07/19 0228 09/08/19 0206 09/10/19 0148 09/12/19 1144 09/13/19 0546  NA 147* 148* 147* 146* 148*  K 3.4* 3.8 4.6 4.2 3.8  CL 107 109 111 109 108  CO2 27 28 22 26 27   GLUCOSE 155* 132* 94 132* 90  BUN 76* 50* 23 28* 27*  CREATININE 1.91* 1.24 1.01 0.97 0.98  CALCIUM 9.3 9.0 9.1 9.3 9.4   GFR: Estimated Creatinine Clearance: 84.5 mL/min (by C-G formula based on SCr of 0.98 mg/dL). Liver Function Tests: No results for input(s): AST, ALT, ALKPHOS, BILITOT, PROT, ALBUMIN in the last 168 hours. No results for input(s): LIPASE, AMYLASE in the last 168 hours. No results for input(s): AMMONIA in the last 168 hours. Coagulation Profile: No results for input(s): INR, PROTIME in the last 168 hours. Cardiac Enzymes: No results for input(s): CKTOTAL, CKMB, CKMBINDEX, TROPONINI in the last 168 hours. BNP (last 3 results) No results for input(s): PROBNP in the last 8760 hours. HbA1C: No results for input(s): HGBA1C in the last 72 hours. CBG: Recent Labs  Lab 09/08/19 1943  GLUCAP 140*   Lipid Profile: No results for input(s): CHOL, HDL, LDLCALC, TRIG, CHOLHDL, LDLDIRECT in the last 72 hours. Thyroid Function Tests: No results for input(s): TSH, T4TOTAL, FREET4, T3FREE, THYROIDAB in the last 72 hours. Anemia Panel: No results for input(s): VITAMINB12, FOLATE, FERRITIN, TIBC, IRON, RETICCTPCT in the last 72 hours. Urine analysis:    Component Value Date/Time   COLORURINE YELLOW 08/26/2019 1933   APPEARANCEUR CLEAR 08/26/2019 1933   LABSPEC 1.024 08/26/2019 1933   PHURINE 5.0 08/26/2019 1933   GLUCOSEU NEGATIVE 08/26/2019 [place] NEGATIVE 08/26/2019 [place] NEGATIVE 08/26/2019 1933   KETONESUR 20 (A) 08/26/2019 1933    PROTEINUR NEGATIVE 08/26/2019 1933   UROBILINOGEN 0.2 02/04/2013 1315   NITRITE NEGATIVE 08/26/2019 1933   LEUKOCYTESUR NEGATIVE 08/26/2019 1933        Radiology Studies: No results found.      Scheduled Meds: .  stroke: mapping our early stages of recovery book   Does not apply Once  . amLODipine  10 mg Oral Daily  . aspirin  300 mg Rectal Daily   Or  . aspirin  325 mg Oral Daily  . atorvastatin  80 mg Oral q1800  . clopidogrel  75 mg Oral Daily  . feeding supplement (ENSURE ENLIVE)  237 mL Oral TID BM  . lisinopril  20 mg Oral Daily  . senna-docusate  1 tablet Oral BID   Continuous Infusions: . sodium chloride       LOS: 17 days    Time spent: [place], DO [facility]   If 7PM-7AM, please contact night-coverage [email or web address] Password [facility] 09/13/2019, 9:14 AM

## 2019-09-13 NOTE — TOC Progression Note (Addendum)
Transition of Care [facility]) - Progression Note    Patient Details  Name: Travarus Cerros MRN: [id number] Date of Birth: 12/20/1955  Transition of Care [facility]) CM/SW Contact  Sharin Mons, RN Phone Number: [phone] 09/13/2019, 7:59 AM  Clinical Narrative:    NCM spoke with [facility] admissions liaison [place]. Insurance authorization still pending. Hoping approval/ denial will come today. States will f/u with NCM this am.  09/13/2019 @11 :47 am NCM received call from [facility] admission, authorization received for SNF placement.     09/13/2019 @ 8:15 am [facility] admission requesting a more updated PT/OT evaluation for insurance approval. NCM made MD and bedside nurse aware.  Expected Discharge Plan: [place] Barriers to Discharge: No Barriers Identified, Insurance Authorization(Awaiting insurance authorization , [facility]/SNF)  Expected Discharge Plan and Services Expected Discharge Plan: [place]   Discharge Planning Services: CM Consult   Living arrangements for the past 2 months: Single Family Home                                       Social Determinants of Health (SDOH) Interventions    Readmission Risk Interventions No flowsheet data found.

## 2019-09-13 NOTE — Progress Notes (Signed)
Occupational Therapy Treatment Patient Details Name: [name] MRN: [id number] DOB: 07/29/1956 Today's Date: 09/13/2019    History of present illness [name] is a 63 y.o. year old male with medical history significant for hypertension and previous stroke with some residual deficit who presented on 08/26/2019 with worsening confusion and several falls at home x1 day and was found to have hypertensive emergency with large occipital stroke.   OT comments  Pt with gradual progress towards OT goals. Pt engaged in self-feeding and simple grooming ADL task today. Pt requiring mod-max hand over hand to initiate and carry out both ADL tasks, multimodal cues to carryover task. Pt continues to present with delayed processing and with minimal initiation to participate in bed mobility, overall requiring totalA for repositioning. Continue to recommend SNF level therapies at time of discharge. Will continue to follow acutely.   Follow Up Recommendations  SNF;Supervision/Assistance - 24 hour    Equipment Recommendations  (defer to next venue)          Precautions / Restrictions Precautions Precautions: Fall Restrictions Weight Bearing Restrictions: No       Mobility Bed Mobility Overal bed mobility: Needs Assistance             General bed mobility comments: totalA to reposition in bed as pt with LEs hanging over edge upon arrival, pt with minimal initiation to assist   Transfers                 General transfer comment: deferred    Balance                                           ADL either performed or assessed with clinical judgement   ADL Overall ADL's : Needs assistance/impaired Eating/Feeding: Moderate assistance;Bed level Eating/Feeding Details (indicate cue type and reason): assist to hold ensure and locate straw with mouth Grooming: Wash/dry face;Maximal assistance Grooming Details (indicate cue type and reason): pt requires hand over  hand to initiate, only carries out task for brief few seconds, requires cues to continue task and to wash both sides of face                                                        Cognition Arousal/Alertness: Awake/alert Behavior During Therapy: Flat affect Overall Cognitive Status: Impaired/Different from baseline Area of Impairment: Orientation;Attention;Memory;Following commands;Safety/judgement;Awareness;Problem solving                   Current Attention Level: Focused Memory: Decreased recall of precautions;Decreased short-term memory Following Commands: Follows one step commands inconsistently;Follows one step commands with increased time(with multimodal cues) Safety/Judgement: Decreased awareness of safety;Decreased awareness of deficits Awareness: Intellectual Problem Solving: Slow processing;Requires tactile cues;Requires verbal cues          Exercises Exercises: General Upper Extremity;General Lower Extremity General Exercises - Upper Extremity Shoulder Flexion: AAROM;5 reps;Both General Exercises - Lower Extremity Straight Leg Raises: AAROM;Both;5 reps   Shoulder Instructions       General Comments VSS    Pertinent Vitals/ Pain       Pain Assessment: No/denies pain  Home Living  Prior Functioning/Environment              Frequency  Min 2X/week        Progress Toward Goals  OT Goals(current goals can now be found in the care plan section)  Progress towards OT goals: Progressing toward goals  Acute Rehab OT Goals Patient Stated Goal: none stated OT Goal Formulation: Patient unable to participate in goal setting Time For Goal Achievement: 09/16/19 Potential to Achieve Goals: [place] Discharge plan remains appropriate    Co-evaluation                 AM-PAC OT "6 Clicks" Daily Activity     Outcome Measure   Help from another person eating  meals?: A Lot Help from another person taking care of personal grooming?: A Lot Help from another person toileting, which includes using toliet, bedpan, or urinal?: Total Help from another person bathing (including washing, rinsing, drying)?: Total Help from another person to put on and taking off regular upper body clothing?: Total Help from another person to put on and taking off regular lower body clothing?: Total 6 Click Score: 8    End of Session    OT Visit Diagnosis: Hemiplegia and hemiparesis;Other symptoms and signs involving the nervous system (R29.898) Hemiplegia - Right/Left: Left Hemiplegia - dominant/non-dominant: Non-Dominant Hemiplegia - caused by: Cerebral infarction   Activity Tolerance Patient tolerated treatment well   Patient Left in bed;with call bell/phone within reach;with bed alarm set   Nurse Communication Mobility status        Time: [id number] OT Time Calculation (min): 17 min  Charges: OT General Charges $OT Visit: 1 Visit OT Treatments $Self Care/Home Management : 8-22 mins  Lou Cal, [place] Pager [phone] Office [phone]    Raymondo Band 09/13/2019, 5:05 PM

## 2019-09-13 NOTE — Progress Notes (Signed)
Initial Nutrition Assessment  INTERVENTION:   -Ensure Enlive po TID, each supplement provides 350 kcal and 20 grams of protein -Prostat liquid protein PO 30 ml BID with meals, each supplement provides 100 kcal, 15 grams protein. -Multivitamin with minerals daily  NUTRITION DIAGNOSIS:   Inadequate oral intake related to lethargy/confusion, poor appetite as evidenced by meal completion < 25%.  GOAL:   Patient will meet greater than or equal to 90% of their needs  MONITOR:   PO intake, Supplement acceptance, Labs, Weight trends, I & O's  REASON FOR ASSESSMENT:   Consult Calorie Count  ASSESSMENT:   63 y.o. year old male with medical history significant for hypertension and previous stroke with some residual deficit who presented on 08/26/2019 with worsening confusion and several falls at home x1 day and was found to have hypertensive emergency with large occipital stroke.  12/10: admitted for acute stroke, and coccaine use, and alk phos elevation 12/11: SLP evaluation: recommends dysphagia 2 diet with thin liquids  **RD working remotely**  Patient with slurred speech, alert/oriented x 3.  Per chart review, pt's diet has been unable to be advanced from dysphagia 2 diet. Pt has been eating very poorly.   Palliative care was consulted and family had opted for PEG tube if PO intakes could not improve. MD is concerned pt will pull tube out if placed. Will continue calorie count and monitor for plan of care.  PO intakes since 12/20 have been documented as 0-10%. MD ordered for Calorie count starting 12/27. Calorie Count results:  12/27 B: 0% L: ~105 kcals, 5g protein D: "bites" Supplements: 700 kcals, 40g protein (2 Ensures) Total: ~805 kcals (44% of needs), 45g protein (64% of needs)  Will add addition of Prostats to help meet protein needs. (3 Ensures +2 Prostats provides ~1250 kcals and 90g protein). Per weight records, no recent weights available PTA. Last recorded weight  was 85.3 kg in 2014.  I/Os:  -6.9L since 12/14 UOP: 200 ml x 24 hrs  Medications reviewed. Labs reviewed: CBGs: 140 Elevated Na  NUTRITION - FOCUSED PHYSICAL EXAM:  Working remotely.  Diet Order:   Diet Order            DIET DYS 2 Room service appropriate? Yes; Fluid consistency: Thin  Diet effective now              EDUCATION NEEDS:   Not appropriate for education at this time  Skin:  Skin Assessment: Skin Integrity Issues: Skin Integrity Issues:: Other (Comment) Other: skin tear on penis  Last BM:  12/20  Height:   Ht Readings from Last 1 Encounters:  09/04/19 '5\' 9"'  (1.753 m)    Weight:   Wt Readings from Last 1 Encounters:  09/04/19 85 kg    Ideal Body Weight:  72.7 kg  BMI:  Body mass index is 27.67 kg/m.  Estimated Nutritional Needs:   Kcal:  1800-2000  Protein:  70-85g  Fluid:  1.8L/day  Clayton Bibles, MS, RD, LDN Inpatient Clinical Dietitian Pager: [phone] After Hours Pager: [phone]

## 2019-09-14 LAB — CBC
HCT: 52.4 % — ABNORMAL HIGH (ref 39.0–52.0)
Hemoglobin: 16.9 g/dL (ref 13.0–17.0)
MCH: 27.8 pg (ref 26.0–34.0)
MCHC: 32.3 g/dL (ref 30.0–36.0)
MCV: 86 fL (ref 80.0–100.0)
Platelets: 352 10*3/uL (ref 150–400)
RBC: 6.09 MIL/uL — ABNORMAL HIGH (ref 4.22–5.81)
RDW: 14 % (ref 11.5–15.5)
WBC: 10.1 10*3/uL (ref 4.0–10.5)
nRBC: 0 % (ref 0.0–0.2)

## 2019-09-14 LAB — BASIC METABOLIC PANEL
Anion gap: 12 (ref 5–15)
BUN: 23 mg/dL (ref 8–23)
CO2: 26 mmol/L (ref 22–32)
Calcium: 9.5 mg/dL (ref 8.9–10.3)
Chloride: 108 mmol/L (ref 98–111)
Creatinine, Ser: 0.88 mg/dL (ref 0.61–1.24)
GFR calc Af Amer: >60 mL/min (ref >60)
GFR calc non Af Amer: >60 mL/min (ref >60)
Glucose, Bld: 116 mg/dL — ABNORMAL HIGH (ref 70–99)
Potassium: 4.2 mmol/L (ref 3.5–5.1)
Sodium: 146 mmol/L — ABNORMAL HIGH (ref 135–145)

## 2019-09-14 MED ORDER — CARVEDILOL 6.25 MG PO TABS
6.2500 mg | ORAL_TABLET | Freq: Two times a day (BID) | ORAL | Status: DC
  Administered 2019-09-14 – 2019-09-16 (×4): 6.25 mg via ORAL
  Filled 2019-09-14 (×4): qty 1

## 2019-09-14 NOTE — Progress Notes (Signed)
Calorie Count Note  48 hour calorie count ordered. Day 2 results  Diet: Dysphagia 2 diet, thin liquids Supplements:  -Ensure Enlive po BID, each supplement provides 350 kcal and 20 grams of protein -Prostat liquid protein PO 30 ml BID with meals, each supplement provides 100 kcal, 15 grams protein.   12/28: Breakfast: nothing consumed from tray Lunch: not documented Dinner: not documented, pt did eat some dinner based on OT note. Supplements: 1150 kcals, 75g protein (3 Ensures, 1 Prostat  Total intake: 1150 kcal (63% of minimum estimated needs)  75g protein (100% of minimum estimated needs)  Nutrition Dx: Inadequate oral intake related to lethargy/confusion, poor appetite as evidenced by meal completion < 25%.  Goal: Pt to meet >/= 90% of their estimated nutrition needs   Intervention:  -Continue Ensure Enlive po TID, each supplement provides 350 kcal and 20 grams of protein -Continue Prostat liquid protein PO 30 ml BID with meals, each supplement provides 100 kcal, 15 grams protein. -Multivitamin with minerals daily -D/c Calorie Count  Clayton Bibles, MS, RD, LDN Inpatient Clinical Dietitian Pager: [phone] After Hours Pager: [phone]

## 2019-09-14 NOTE — Progress Notes (Signed)
Physical Therapy Treatment Patient Details Name: [name] MRN: [id number] DOB: [birth year] Today's Date: 09/14/2019    History of Present Illness [name] is a 63 y.o. year old male with medical history significant for hypertension and previous stroke with some residual deficit who presented on 08/26/2019 with worsening confusion and several falls at home x1 day and was found to have hypertensive emergency with large occipital stroke.    PT Comments    Pt with history of resisting therapy and becoming agitated however today pt was not agitated and participate in session. Pt consistently followed commands with R UE and LE but not the L. Pt remains to require maxAx2 for supine to sit, sit to stand, and std pvt to chair. Pt with strong L lateral and posterior lean requiring tactile cues to maintain midline position and to assist with weight-shifting to sequencing stepping. Pt remains appropriate for SNF upon d/c.    Follow Up Recommendations  SNF     Equipment Recommendations  None recommended by PT(TBD at next venue)    Recommendations for Other Services Rehab consult     Precautions / Restrictions Precautions Precautions: Fall Precaution Comments: R sided neglect, keeps head turned to L and leans Left Restrictions Weight Bearing Restrictions: No    Mobility  Bed Mobility Overal bed mobility: Needs Assistance Bed Mobility: Supine to Sit     Supine to sit: Max assist;+2 for physical assistance;+2 for safety/equipment     General bed mobility comments: maxA for LE management to EOB and maxAX2 for trunk elevation to sit EOB, pt with strong retropulsion and L Lateral lean  Transfers Overall transfer level: Needs assistance Equipment used: (2 person lift with gait belt and pad) Transfers: Sit to/from [employer] Transfers Sit to Stand: Max assist;+2 physical assistance Stand pivot transfers: Max assist;+2 physical assistance       General transfer comment:  pt initiated powering up, however with strong L lateral lean requirnig maxA for weight shifting to sequence stepping to chair, maxA to advance R LE towards chair  Ambulation/Gait             General Gait Details: uable this date   Stairs             Wheelchair Mobility    Modified Rankin (Stroke Patients Only) Modified Rankin (Stroke Patients Only) Pre-Morbid Rankin Score: Slight disability Modified Rankin: Severe disability     Balance Overall balance assessment: Needs assistance;History of Falls Sitting-balance support: Feet supported;Bilateral upper extremity supported Sitting balance-Leahy Scale: Poor Sitting balance - Comments: pt requiring modA to maintain EOB balance Postural control: Posterior lean;Left lateral lean Standing balance support: Bilateral upper extremity supported Standing balance-Leahy Scale: Zero Standing balance comment: dependent on physical assistx2                            Cognition Arousal/Alertness: Awake/alert Behavior During Therapy: Flat affect Overall Cognitive Status: Impaired/Different from baseline Area of Impairment: Orientation;Attention;Memory;Following commands;Safety/judgement;Awareness;Problem solving                 Orientation Level: Situation(pt able to state name, [place], and dec when given choice) Current Attention Level: Focused Memory: Decreased short-term memory Following Commands: Follows one step commands with increased time;Follows one step commands inconsistently(follows command on R UE/LE but no L UE/LE) Safety/Judgement: Decreased awareness of safety;Decreased awareness of deficits Awareness: Intellectual Problem Solving: Slow processing;Requires tactile cues;Requires verbal cues General Comments: pt with bilat mittens but didn't pull  or reach for anythign today, pt wasn't agitated and didn't resist assist today, pt much more responsive and participatory than previous sessions       Exercises      General Comments General comments (skin integrity, edema, etc.): VSS      Pertinent Vitals/Pain Pain Assessment: No/denies pain    Home Living                      Prior Function            PT Goals (current goals can now be found in the care plan section) Progress towards PT goals: Progressing toward goals    Frequency    Min 3X/week      PT Plan Current plan remains appropriate    Co-evaluation              AM-PAC PT "6 Clicks" Mobility   Outcome Measure  Help needed turning from your back to your side while in a flat bed without using bedrails?: A Lot Help needed moving from lying on your back to sitting on the side of a flat bed without using bedrails?: A Lot Help needed moving to and from a bed to a chair (including a wheelchair)?: Total Help needed standing up from a chair using your arms (e.g., wheelchair or bedside chair)?: Total Help needed to walk in hospital room?: Total Help needed climbing 3-5 steps with a railing? : Total 6 Click Score: 8    End of Session Equipment Utilized During Treatment: Gait belt Activity Tolerance: Patient tolerated treatment well Patient left: in chair;with call bell/phone within reach;with chair alarm set;with restraints reapplied(bilat mittens on) Nurse Communication: Mobility status;Need for lift equipment(use steady to get pt back to bed) PT Visit Diagnosis: Other abnormalities of gait and mobility (R26.89);Other symptoms and signs involving the nervous system (R29.898);History of falling (Z91.81);Repeated falls (R29.6)     Time: [id number] PT Time Calculation (min) (ACUTE ONLY): 27 min  Charges:  $Therapeutic Activity: 8-22 mins $Neuromuscular Re-education: 8-22 mins                     Kittie Plater, PT, DPT Acute Rehabilitation Services Pager #: [phone] Office #: [phone]    Berline Lopes 09/14/2019, 10:40 AM

## 2019-09-14 NOTE — Progress Notes (Signed)
PROGRESS NOTE    [name]  [id number] DOB: [birth year] DOA: 08/26/2019 PCP: Alroy Dust, L.[name], MD    Brief Narrative:  63 y.o.year old malewith medical history significant for hypertension and previous stroke with some residual deficit who presented on 12/10/2020with worsening confusion and several falls at home x1 day and was found to have hypertensive emergency with large occipital stroke. In ED, initially noted to be hypertensive with blood pressure 230/125 and creatinine of 1.3. Upon evaluation in the ED patient was intermittently following commands with noticeable facial droop and slurred speech per family consistent with his baseline of previous stroke. CT head initially revealed age-indeterminate infarct. Given persistently high blood pressure and intermittent confusion, neurology was consulted who recommended permissive hypertension and ordered MRI which showed a large occipital lobe stroke. Hospital course complicated by continued confusion and poor PO intake. Per palliative care consult, wife still wants all medical interventions done.  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 Found to be cocaine positive. Likely in the setting of hypertensive emergency and substance abuse, likely thrombotic could also be embolic. 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 Preserved EF on TTE without PFO. A1c 6.2, LDL 107, UDS positive for cocaine -Neurology f/u with [facility] in 4 weeks -Cardiology will arrange30 day cardiac event monitoring for Afib rule out -plavix 75 mg and ASA qd for DAPT x 3 months, then plavix alone per neurology  Poor PO intake -limited documentation, will do calorie count -?PEG. Palliative care consulted for goals of care conversation. Wife still wants all medical interventions  including PEG placement if needed but wants to give the patient time to recover -LM for wife to discuss -calorie count pending but nursing staff report improved PO intake  Hypernatremia -Likely secondary to decreased PO intake -IV overnight again with recheck in AM  AKIonCKD stage 3b -Last creatinine from 2014 was 1.14. Cr1.32 on admission, has ranged from 1.3-1.6 this admission -Continue to monitor BMP   Hypokalemia -repleted  Acute metabolic encephalopathy -Will likely be prone to bouts of confusion given large PCA stroke   Essential HTN  -Started Norvasc -added back lisinopril and increased now that 10 days post CVA to improve control. 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 -Incidentally found on imaging for stroke work-up -Histologic sampling should be considered due to degree of overlap of imaging features between benign and malignant parotid tumors by ENT --defer to the outpatient setting   Code Status: full Family Communication: LM for wife-- need to discuss feeding tube as calorie count was not accurate but clearly not eating enough Disposition Plan: needs SNF, awaiting placement     Consultants:   Neurology  Palliative care    Subjective: Working with PT today  Objective: Vitals:   09/13/19 1546 09/13/19 2012 09/13/19 2315 09/14/19 0901  BP: (!) 153/93 (!) 140/95 (!) 141/98 (!) 145/111  Pulse: (!) 101 96 91 95  Resp: 16 16 16 18   Temp: (!) 97.5 F (36.4 C) 98.3 F (36.8 C) 98.2 F (36.8 C) 100 F (37.8 C)  TempSrc: Oral  Oral Oral  SpO2:  98% 98% 100%  Weight:      Height:  Intake/Output Summary (Last 24 hours) at 09/14/2019 1306 Last data filed at 09/14/2019 1000 Gross per 24 hour  Intake 1243.58 ml  Output 750 ml  Net 493.58 ml   Filed Weights   09/04/19 0847   Weight: 85 kg    Examination: In  [place] and more interactive this AM rrr No increased work of breathing No LE edema     Data Reviewed: I have personally reviewed following labs and imaging studies  CBC: Recent Labs  Lab 09/08/19 0206 09/10/19 0148 09/13/19 0546 09/14/19 0332  WBC 12.8* 11.9* 12.4* 10.1  HGB 17.6* 17.2* 17.3* 16.9  HCT 55.5* 54.3* 54.0* 52.4*  MCV 86.2 87.9 85.9 86.0  PLT 341 272 377 [id number]   Basic Metabolic Panel: Recent Labs  Lab 09/08/19 0206 09/10/19 0148 09/12/19 1144 09/13/19 0546 09/14/19 0332  NA 148* 147* 146* 148* 146*  K 3.8 4.6 4.2 3.8 4.2  CL 109 111 109 108 108  CO2 28 22 26 27 26   GLUCOSE 132* 94 132* 90 116*  BUN 50* 23 28* 27* 23  CREATININE 1.24 1.01 0.97 0.98 0.88  CALCIUM 9.0 9.1 9.3 9.4 9.5   GFR: Estimated Creatinine Clearance: 94.1 mL/min (by C-G formula based on SCr of 0.88 mg/dL). Liver Function Tests: No results for input(s): AST, ALT, ALKPHOS, BILITOT, PROT, ALBUMIN in the last 168 hours. No results for input(s): LIPASE, AMYLASE in the last 168 hours. No results for input(s): AMMONIA in the last 168 hours. Coagulation Profile: No results for input(s): INR, PROTIME in the last 168 hours. Cardiac Enzymes: No results for input(s): CKTOTAL, CKMB, CKMBINDEX, TROPONINI in the last 168 hours. BNP (last 3 results) No results for input(s): PROBNP in the last 8760 hours. HbA1C: No results for input(s): HGBA1C in the last 72 hours. CBG: Recent Labs  Lab 09/08/19 1943  GLUCAP 140*   Lipid Profile: No results for input(s): CHOL, HDL, LDLCALC, TRIG, CHOLHDL, LDLDIRECT in the last 72 hours. Thyroid Function Tests: No results for input(s): TSH, T4TOTAL, FREET4, T3FREE, THYROIDAB in the last 72 hours. Anemia Panel: No results for input(s): VITAMINB12, FOLATE, FERRITIN, TIBC, IRON, RETICCTPCT in the last 72 hours. Urine analysis:    Component Value Date/Time   COLORURINE YELLOW 08/26/2019 1933   APPEARANCEUR  CLEAR 08/26/2019 1933   LABSPEC 1.024 08/26/2019 1933   PHURINE 5.0 08/26/2019 1933   GLUCOSEU NEGATIVE 08/26/2019 [place] NEGATIVE 08/26/2019 [place] NEGATIVE 08/26/2019 1933   KETONESUR 20 (A) 08/26/2019 1933   PROTEINUR NEGATIVE 08/26/2019 1933   UROBILINOGEN 0.2 02/04/2013 1315   NITRITE NEGATIVE 08/26/2019 1933   LEUKOCYTESUR NEGATIVE 08/26/2019 1933        Radiology Studies: No results found.      Scheduled Meds: .  stroke: mapping our early stages of recovery book   Does not apply Once  . amLODipine  10 mg Oral Daily  . aspirin  300 mg Rectal Daily   Or  . aspirin  325 mg Oral Daily  . atorvastatin  80 mg Oral q1800  . carvedilol  6.25 mg Oral BID WC  . clopidogrel  75 mg Oral Daily  . feeding supplement (ENSURE ENLIVE)  237 mL Oral TID BM  . feeding supplement (PRO-STAT SUGAR FREE 64)  30 mL Oral BID WC  . lisinopril  20 mg Oral Daily  . multivitamin with minerals  1 tablet Oral Daily  . senna-docusate  1 tablet Oral BID   Continuous Infusions: . sodium chloride 50  mL/hr at 09/14/19 0536     LOS: 18 days    Time spent: 25 min    Geradine Girt, DO [facility]   If 7PM-7AM, please contact night-coverage [email or web address] Password [email or web address] 09/14/2019, 1:06 PM

## 2019-09-14 NOTE — TOC Progression Note (Signed)
Transition of Care [facility]) - Progression Note    Patient Details  Name: [name] MRN: [id number] Date of Birth: 05-31-1956  Transition of [facility]) CM/SW Contact  Sharin Mons, RN Phone Number: [phone] 09/14/2019, 2:39 PM  Clinical Narrative:    NCM received call from [place] admissions liaison . Liaison informed NCM they have no SNF  beds @ [facility], however, they will extend a bed offer for [facility] or [place]. NCM made wife aware and [facility] selected. NCM made liaison aware and noted in [place].   Expected Discharge Plan: [place] Barriers to Discharge: Continued Medical Work up  Expected Discharge Plan and Services Expected Discharge Plan: [place]   Discharge Planning Services: CM Consult   Living arrangements for the past 2 months: Single Family Home                                       Social Determinants of Health (SDOH) Interventions    Readmission Risk Interventions No flowsheet data found.

## 2019-09-15 DIAGNOSIS — F1410 Cocaine abuse, uncomplicated: Secondary | ICD-10-CM

## 2019-09-15 LAB — BASIC METABOLIC PANEL
Anion gap: 11 (ref 5–15)
BUN: 21 mg/dL (ref 8–23)
CO2: 25 mmol/L (ref 22–32)
Calcium: 9.1 mg/dL (ref 8.9–10.3)
Chloride: 107 mmol/L (ref 98–111)
Creatinine, Ser: 0.84 mg/dL (ref 0.61–1.24)
GFR calc Af Amer: >60 mL/min (ref >60)
GFR calc non Af Amer: >60 mL/min (ref >60)
Glucose, Bld: 143 mg/dL — ABNORMAL HIGH (ref 70–99)
Potassium: 4.2 mmol/L (ref 3.5–5.1)
Sodium: 143 mmol/L (ref 135–145)

## 2019-09-15 LAB — SARS CORONAVIRUS 2 (TAT 6-24 HRS): SARS Coronavirus 2: NEGATIVE

## 2019-09-15 MED ORDER — LISINOPRIL 20 MG PO TABS
30.0000 mg | ORAL_TABLET | Freq: Every day | ORAL | Status: DC
  Administered 2019-09-16: 30 mg via ORAL
  Filled 2019-09-15: qty 1

## 2019-09-15 NOTE — Progress Notes (Signed)
Pt not following commands to swallow breakfast this am.  Pt unable to swallow and tried spitting his food out and had difficulty spitting it out also.

## 2019-09-15 NOTE — TOC Progression Note (Addendum)
Transition of Care [facility]) - Progression Note    Patient Details  Name: Shaft Tia MRN: [id number] Date of Birth: 03-Mar-1956  Transition of Care [facility]) CM/SW Contact  Sharin Mons, RN Phone Number: [phone] 09/15/2019, 9:15 AM  Clinical Narrative:    Pt now awaiting insurance authorization for [facility] SNF and updated COVID. Liaison with SNF to f/u with NCM once authorization received. MD made aware.  09/15/2019 @ 0945 Authorization received for SNF/ [place].  Updated COVID pending ...  Expected Discharge Plan: Skilled Nursing Facility Barriers to Discharge: [place] pending)  Expected Discharge Plan and Services Expected Discharge Plan: [place]   Discharge Planning Services: CM Consult   Living arrangements for the past 2 months: Single Family Home                                       Social Determinants of Health (SDOH) Interventions    Readmission Risk Interventions No flowsheet data found.

## 2019-09-15 NOTE — Progress Notes (Signed)
  Speech Language Pathology Treatment: Dysphagia  Patient Details Name: [name] MRN: [id number] DOB: [birth year] Today's Date: 09/15/2019 Time: [id number] SLP Time Calculation (min) (ACUTE ONLY): 13 min  Assessment / Plan / Recommendation Clinical Impression  New orders received for pt who is being seeing for ongoing dysphagia management.  RN reports refusal of POs and difficulty with oral transit. Pt would not drink ensure.  Pt unable to expectorate foods as well.  On SLP arrival, oral residuals of eggs noted in oral cavity.  SLP provided oral care.  With SLP pt consumed thin liquid by straw with no clinical s/s of aspiration.  Pt consumed entire ensure, while SLP was in room.  There was decreased labial seal with minimal labial escape of all consistencies. Pocketing of puree and simulated ground consistency noted anteriorly between teeth and lips. Suction used to remove.  Oral phase more efficient with puree solids than with ground consistency.  Pt reports that he is not hungry and does not like solids.  Discussed diet preference with pt (full liquid v. Puree v. Chopped/ground).  Pt agreeable to puree solids with thin liquids.  States that ensure is most appetizing option at this time.  Recommend puree diet with thin liquid.  SLP to follow weekly for diet tolerance and possible upgrade.  Consider AMN, if pt is unable to meet nutritional needs orally.    HPI HPI: Pt is a 63 yo male admitted with AMS and falls. 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 PMH includes: CVA (L frontal in 2014, treated for aphasia, mild dysarthria, and cognitive impairment at that time), HTN      SLP Plan  Continue with current plan of care       Recommendations  Diet recommendations: Dysphagia 1 (puree);Thin liquid Liquids provided via: Cup;Straw Medication Administration: Crushed with puree Supervision: Staff to assist with self feeding;Full supervision/cueing for compensatory strategies Compensations:  Slow rate;Small sips/bites(check oral cavity for food; have suction available) Postural Changes and/or Swallow Maneuvers: Seated upright 90 degrees                Oral Care Recommendations: Oral care BID;Oral care before and after PO Follow up Recommendations: Skilled Nursing facility SLP Visit Diagnosis: Dysphagia, unspecified (R13.10) Plan: Continue with current plan of care       Beltrami, Roland, [place] Office: [phone]  09/15/2019, 10:49 AM

## 2019-09-15 NOTE — Progress Notes (Signed)
Patient ID: [name], male   DOB: 1956/08/20, 63 y.o.   MRN: [id number]  PROGRESS NOTE    [name]  [id number] DOB: [birth year] DOA: 08/26/2019 PCP: Alroy Dust, L.[name], MD   Brief Narrative:  63 year old male with history of hypertension, previous unspecified stroke with some residual deficit presented on 08/26/2019 with worsening confusion and several falls at home and was found to have hypertensive emergency with large occipital stroke.  In the ED, he was noted to be hypertensive with blood pressure of 230/125 along with facial droop/slurred speech.  CT head initially revealed age indeterminate infarct.  Neurology was consulted.  MRI of the brain showed large occipital lobe stroke.  Hospital course has been complicated by continued confusion and poor p.o. intake.  Per palliative care consult, wife still wants all interventions done.  Awaiting SNF placement but barriers to discharge: Decision about PEG tube placement as patient is not eating enough but there is a fear that he will pull out the PEG tube.  Assessment & Plan:   Large right PCA territory infarct in a patient with history of prior unspecified stroke Hypertensive emergency -Found to be cocaine positive as well on UDS -Early thrombotic but also could be embolic.  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  LDL 107 -Neurology has signed off with follow-up with neurology in 4 weeks.  Continue aspirin and Plavix for 3 months then Plavix alone per neurology.  Cardiology will arrange 30-day cardiac event monitoring to rule out A. Fib  Dysphagia Poor oral intake -Diet as per SLP recommendations.  As per nursing staff, oral intake is still very poor.  Dietary recommendations appreciated.  Calorie count has been discontinued -We will ask SLP to reevaluate.  Will hold off on PEG tube placement if able to swallow appropriately.  Generalized conditioning - Per  palliative care consult, wife still wants all interventions done. -We will need SNF placement  Hypernatremia -Probably from dehydration and poor oral intake.  Improved.  Monitor  AKI on CKD stage IIIb -Last creatinine from 2014 was 1.14.  Creatinine 1.32 on admission. -Resolved  Acute metabolic encephalopathy -Probably from stroke and poor oral intake.  Monitor mental status.  Fall precautions.  Essential hypertension -Blood pressure still on the higher side.  Monitor.  Continue amlodipine, Coreg.  Increase lisinopril to 30 mg daily.  Substance abuse -UDS positive for cocaine.  Cocaine cessation was emphasized during the hospitalization.  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 Left ventricular ejection fraction, by visual estimation, is 65 to 70%. The left ventricle has normal function. There is severely increased left ventricular hypertrophy.  2. Left ventricular diastolic parameters are consistent with Grade I diastolic dysfunction (impaired relaxation).  3. The left ventricle has no regional wall motion abnormalities.  4. Global right ventricle has normal systolic function.The right ventricular size is normal. No increase in right ventricular wall thickness.  5. Left atrial size was normal.  6. Right atrial size was normal.  7. The mitral valve is normal in structure.  Trivial mitral valve regurgitation.  8. The tricuspid valve is normal in structure. Tricuspid valve regurgitation is not demonstrated.  9. The  aortic valve is normal in structure. Aortic valve regurgitation is not visualized. Mild aortic valve sclerosis without stenosis. 10. The pulmonic valve was grossly normal. Pulmonic valve regurgitation is not visualized. 11. The atrial septum is grossly normal.  Antimicrobials:  None  Subjective: Patient seen and examined at bedside.  He is awake, withdrawn, slightly confused.  Hardly participates in any conversation.  Nursing staff reports poor oral intake.  No overnight fever or vomiting reported. Objective: Vitals:   09/14/19 1624 09/14/19 2325 09/15/19 0312 09/15/19 0746  BP: (!) 134/96 (!) 146/86  (!) 150/93  Pulse: 91 88  85  Resp: 18 16  16   Temp: 98.2 F (36.8 C) 98.7 F (37.1 C)  98.4 F (36.9 C)  TempSrc: Oral   Oral  SpO2: 100% 100%  97%  Weight:   78.9 kg   Height:        Intake/Output Summary (Last 24 hours) at 09/15/2019 1015 Last data filed at 09/15/2019 0600 Gross per 24 hour  Intake 873.77 ml  Output 500 ml  Net 373.77 ml   Filed Weights   09/04/19 0847 09/15/19 0312  Weight: 85 kg 78.9 kg    Examination:  General exam: Appears calm and comfortable.  No distress.  Awake but slightly confused.  Poor historian.  Does not want to interact much. Respiratory system: Bilateral decreased breath sounds at bases Cardiovascular system: S1 & S2 heard, Rate controlled Gastrointestinal system: Abdomen is nondistended, soft and nontender. Normal bowel sounds heard. Extremities: No cyanosis, clubbing, edema    Data Reviewed: I have personally reviewed following labs and imaging studies  CBC: Recent Labs  Lab 09/10/19 0148 09/13/19 0546 09/14/19 0332  WBC 11.9* 12.4* 10.1  HGB 17.2* 17.3* 16.9  HCT 54.3* 54.0* 52.4*  MCV 87.9 85.9 86.0  PLT 272 377 [id number]   Basic Metabolic Panel: Recent Labs  Lab 09/10/19 0148 09/12/19 1144 09/13/19 0546 09/14/19 0332 09/15/19 0730  NA 147* 146* 148* 146* 143  K 4.6 4.2 3.8 4.2 4.2  CL 111 109 108 108 107  CO2 22 26  27 26 25   GLUCOSE 94 132* 90 116* 143*  BUN 23 28* 27* 23 21  CREATININE 1.01 0.97 0.98 0.88 0.84  CALCIUM 9.1 9.3 9.4 9.5 9.1   GFR: Estimated Creatinine Clearance: 91.2 mL/min (by C-G formula based on SCr of 0.84 mg/dL). Liver Function Tests: No results for input(s): AST, ALT, ALKPHOS, BILITOT, PROT, ALBUMIN in the last 168 hours. No results for input(s): LIPASE, AMYLASE in the last 168 hours. No results for input(s): AMMONIA in the last 168 hours. Coagulation Profile: No results for input(s): INR, PROTIME in the last 168 hours. Cardiac Enzymes: No results for input(s): CKTOTAL, CKMB, CKMBINDEX, TROPONINI in the last 168 hours. BNP (last 3 results) No results for input(s): PROBNP in the last 8760 hours. HbA1C: No results for input(s): HGBA1C in the last 72 hours. CBG: Recent Labs  Lab 09/08/19 1943  GLUCAP 140*   Lipid Profile: No results for input(s): CHOL, HDL, LDLCALC, TRIG, CHOLHDL, LDLDIRECT in the last 72 hours. Thyroid Function Tests: No results for input(s): TSH, T4TOTAL, FREET4, T3FREE, THYROIDAB in the last 72 hours. Anemia Panel: No results for input(s): VITAMINB12, FOLATE, FERRITIN, TIBC, IRON, RETICCTPCT in the last 72 hours. Sepsis Labs: No results for input(s): PROCALCITON, LATICACIDVEN in the last 168 hours.  No results found  for this or any previous visit (from the past 240 hour(s)).       Radiology Studies: No results found.      Scheduled Meds: .  stroke: mapping our early stages of recovery book   Does not apply Once  . amLODipine  10 mg Oral Daily  . aspirin  300 mg Rectal Daily   Or  . aspirin  325 mg Oral Daily  . atorvastatin  80 mg Oral q1800  . carvedilol  6.25 mg Oral BID WC  . clopidogrel  75 mg Oral Daily  . feeding supplement (ENSURE ENLIVE)  237 mL Oral TID BM  . feeding supplement (PRO-STAT SUGAR FREE 64)  30 mL Oral BID WC  . lisinopril  20 mg Oral Daily  . multivitamin with minerals  1 tablet Oral Daily  . senna-docusate   1 tablet Oral BID   Continuous Infusions:        Aline August, MD [facility] 09/15/2019, 10:15 AM

## 2019-09-16 LAB — CBC WITH DIFFERENTIAL/PLATELET
Abs Immature Granulocytes: 0.04 10*3/uL (ref 0.00–0.07)
Basophils Absolute: 0 10*3/uL (ref 0.0–0.1)
Basophils Relative: 0 %
Eosinophils Absolute: 0.2 10*3/uL (ref 0.0–0.5)
Eosinophils Relative: 2 %
HCT: 49.1 % (ref 39.0–52.0)
Hemoglobin: 15.7 g/dL (ref 13.0–17.0)
Immature Granulocytes: 0 %
Lymphocytes Relative: 22 %
Lymphs Abs: 2.3 10*3/uL (ref 0.7–4.0)
MCH: 27.5 pg (ref 26.0–34.0)
MCHC: 32 g/dL (ref 30.0–36.0)
MCV: 86 fL (ref 80.0–100.0)
Monocytes Absolute: 1 10*3/uL (ref 0.1–1.0)
Monocytes Relative: 9 %
Neutro Abs: 7 10*3/uL (ref 1.7–7.7)
Neutrophils Relative %: 67 %
Platelets: 353 10*3/uL (ref 150–400)
RBC: 5.71 MIL/uL (ref 4.22–5.81)
RDW: 13.7 % (ref 11.5–15.5)
WBC: 10.5 10*3/uL (ref 4.0–10.5)
nRBC: 0 % (ref 0.0–0.2)

## 2019-09-16 LAB — BASIC METABOLIC PANEL
Anion gap: 11 (ref 5–15)
BUN: 19 mg/dL (ref 8–23)
CO2: 27 mmol/L (ref 22–32)
Calcium: 9.2 mg/dL (ref 8.9–10.3)
Chloride: 104 mmol/L (ref 98–111)
Creatinine, Ser: 0.8 mg/dL (ref 0.61–1.24)
GFR calc Af Amer: >60 mL/min (ref >60)
GFR calc non Af Amer: >60 mL/min (ref >60)
Glucose, Bld: 130 mg/dL — ABNORMAL HIGH (ref 70–99)
Potassium: 3.8 mmol/L (ref 3.5–5.1)
Sodium: 142 mmol/L (ref 135–145)

## 2019-09-16 LAB — MAGNESIUM: Magnesium: 2.2 mg/dL (ref 1.7–2.4)

## 2019-09-16 MED ORDER — SENNOSIDES-DOCUSATE SODIUM 8.6-50 MG PO TABS: 1.0000 | ORAL_TABLET | Freq: Two times a day (BID) | ORAL | 0 refills | Status: DC

## 2019-09-16 MED ORDER — ATORVASTATIN CALCIUM 80 MG PO TABS: 80.0000 mg | ORAL_TABLET | Freq: Every day | ORAL | 0 refills | Status: DC

## 2019-09-16 MED ORDER — CARVEDILOL 6.25 MG PO TABS: 6.2500 mg | ORAL_TABLET | Freq: Two times a day (BID) | ORAL | 0 refills | Status: DC

## 2019-09-16 MED ORDER — ALUM & MAG HYDROXIDE-SIMETH 200-200-20 MG/5ML PO SUSP: 15.0000 mL | ORAL | Status: DC | PRN

## 2019-09-16 MED ORDER — NITROGLYCERIN 0.4 MG SL SUBL: 0.4000 mg | SUBLINGUAL_TABLET | SUBLINGUAL | Status: DC | PRN

## 2019-09-16 MED ORDER — CLOPIDOGREL BISULFATE 75 MG PO TABS: 75.0000 mg | ORAL_TABLET | Freq: Every day | ORAL | 0 refills | Status: DC

## 2019-09-16 MED ORDER — AMLODIPINE BESYLATE 10 MG PO TABS: 10.0000 mg | ORAL_TABLET | Freq: Every day | ORAL | 0 refills | Status: DC

## 2019-09-16 MED ORDER — LISINOPRIL 30 MG PO TABS: 30.0000 mg | ORAL_TABLET | Freq: Every day | ORAL | 0 refills | Status: DC

## 2019-09-16 MED ORDER — MORPHINE SULFATE (PF) 2 MG/ML IV SOLN: 1.0000 mg | INTRAVENOUS | Status: DC | PRN

## 2019-09-16 NOTE — Plan of Care (Signed)
Pt's care plan needs met. He is adequate for discharge.

## 2019-09-16 NOTE — TOC Transition Note (Addendum)
Transition of Care [facility]) - CM/SW Discharge Note   Patient Details  Name: [name] MRN: [id number] Date of Birth: 12/22/1955  [facility] CM/SW Contact:  Ella Bodo, RN Phone Number: 09/16/2019, 10:32 AM   Clinical Narrative:  [name] is a 63 y.o. year old male with medical history significant for hypertension and previous stroke with some residual deficit who presented on 08/26/2019 with worsening confusion and several falls at home x1 day and was found to have hypertensive emergency with large occipital stroke.  Per MD, pt medically stable for discharge to SNF today.  Spoke with Marita Kansas at [facility] SN[facility]; she states they are prepared for patient today.  Bedside nurse to call report to [phone].    Will arrange transport via [facility] for around 12-1pm, per nurse request.  Left pt's wife a message to update on arrangements.    Addendum:  [name] called back; she is in agreement with discharge to [facility] today.  [facility] called for transport at 12:02.        Barriers to Discharge: Barriers Resolved   Patient Goals and CMS Choice   CMS Medicare.gov Compare Post Acute Care list provided to:: Patient Represenative (must comment) Choice offered to / list presented to : Spouse  Discharge Placement                       Discharge Plan and Services   Discharge Planning Services: CM Consult Post Acute Care Choice: [place]                               Social Determinants of Health (SDOH) Interventions     Readmission Risk Interventions Readmission Risk Prevention Plan 09/16/2019  Transportation Screening Complete  PCP or Specialist Appt within 5-7 Days Not Complete  Not Complete comments Pt discharging to SNF  Home Care Screening Not Complete  Home Care Screening Not Completed Comments Pt discharging to SNF  Medication Review (RN CM) Complete  Some recent data might be hidden   Reinaldo Raddle, RN, BSN  Trauma/Neuro ICU Case Manager [phone]

## 2019-09-16 NOTE — Progress Notes (Signed)
Patient was stable at discharge. I removed his IV. We reviewed the discharge education with Caryl Pina - supervisor of [facility]. Patient and facility staff verbalized understanding and had no further questions. Patient left with prescriptions and suitcase w/ all belongings in hand. He was transported via [place].

## 2019-09-16 NOTE — Discharge Summary (Signed)
Physician Discharge Summary  [name] [id number] DOB: Sep 16, 1956 DOA: 08/26/2019  PCP: Alroy Dust, L.[name], MD  Admit date: 08/26/2019 Discharge date: 09/16/2019  Admitted From: Home Disposition: SNF  Recommendations for Outpatient Follow-up:  1. Follow up with SNF provider at earliest convenience 2. Outpatient follow-up with neurology 3. Patient will need outpatient evaluation and follow-up by ENT 4. Follow up in ED if symptoms worsen or new appear   Home Health: No Equipment/Devices: None  Discharge Condition: Guarded CODE STATUS: Full Diet recommendation: Heart healthy/diet as per SLP recommendations  Brief/Interim Summary: 63 year old male with history of hypertension, previous unspecified stroke with some residual deficit presented on 08/26/2019 with worsening confusion and several falls at home and was found to have hypertensive emergency with large occipital stroke.  In the ED, he was noted to be hypertensive with blood pressure of 230/125 along with facial droop/slurred speech.  CT head initially revealed age indeterminate infarct.  Neurology was consulted.  MRI of the brain showed large occipital lobe stroke.  Hospital course has been complicated by continued confusion and poor p.o. intake.  Per palliative care consult, wife still wants all interventions done.  Awaiting SNF placement but barriers to discharge: Decision about PEG tube placement as patient is not eating enough but there is a fear that he will pull out the PEG tube.  Currently, he is tolerating his diet although his oral intake is not that great.  PT recommended SNF.  he will be discharged to SNF once bed is available.  Discharge Diagnoses:   Large right PCA territory infarct in a patient with history of prior unspecified stroke Hypertensive emergency -Found to be cocaine positive as well on UDS -thrombotic but also could be embolic.  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  LDL 107 -Neurology has signed off with follow-up with neurology in 4 weeks.  Continue aspirin and Plavix for 3 months then Plavix alone per neurology.  Cardiology will arrange 30-day cardiac event monitoring to rule out A. Fib  Dysphagia Poor oral intake -Diet as per SLP recommendations.  As per nursing staff, oral intake not that great. Dietary recommendations appreciated.  Calorie count has been discontinued - hold off on PEG tube placement for now. -If oral intake does not improve as an outpatient, that you can be continued as an outpatient.  Generalized conditioning - Per palliative care consult, wife still wants all interventions done.  Hypernatremia -Probably from dehydration and poor oral intake.  Improved.  Outpatient follow-up.  AKI on CKD stage IIIb -Last creatinine from 2014 was 1.14.  Creatinine 1.32 on admission. -Resolved.  Outpatient follow-up  Acute metabolic encephalopathy -Probably from stroke and poor oral intake.    Outpatient follow-up.  Essential hypertension -Blood pressure still on the higher side.  Continue amlodipine, Coreg and lisinopril.  Doses of lisinopril and Coreg can be increased If needed.  Substance abuse -UDS positive for cocaine.  Cocaine cessation was emphasized during the hospitalization.  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 Leonie Man at [facility] in 4-6 weeks.  Patient is Dr. Clydene Fake previous patient. Thanks.   Diet - low sodium heart healthy   Complete by: As directed     Increase activity slowly   Complete by: As directed      Allergies as of 09/16/2019      Reactions   Shellfish Allergy Itching      Medication List    STOP taking these medications   diclofenac sodium 1 % Gel Commonly known as: VOLTAREN     TAKE these medications   amLODipine 10 MG tablet Commonly known as: NORVASC Take 1 tablet (10 mg total) by mouth daily. Start taking on: September 17, 2019   aspirin 325 MG tablet Take 1 tablet (325 mg total) by mouth daily.   atorvastatin 80 MG tablet Commonly known as: LIPITOR Take 1 tablet (80 mg total) by mouth daily at 6 PM. What changed:   medication strength  how much to take   carvedilol 6.25 MG tablet Commonly known as: COREG Take 1 tablet (6.25 mg total) by mouth 2 (two) times daily with a meal.   clopidogrel 75 MG tablet Commonly known as: PLAVIX Take 1 tablet (75 mg total) by mouth daily. Start taking on: September 17, 2019   lisinopril 30 MG tablet Commonly known as: ZESTRIL Take 1 tablet (30 mg total) by mouth daily. Start taking on: September 17, 2019 What changed:   medication strength  how much to take   senna-docusate 8.6-50 MG tablet Commonly known as: Senokot-S Take 1 tablet by mouth 2 (two) times daily.       Contact information for follow-up providers    Garvin Fila, MD. Schedule an appointment as soon as possible for a visit in 4 week(s).   Specialties: Neurology, Radiology Contact information: [address] [place] [address] [phone]            Contact information for after-discharge care    Destination    [facility] .   Service: Skilled Nursing Contact information: [place] [phone]                 Allergies  Allergen Reactions  . Shellfish Allergy Itching    Consultations:  Neurology/palliative care   Procedures/Studies: CT ANGIO HEAD W OR WO CONTRAST  Result Date:  08/27/2019 CLINICAL DATA:  Stroke follow-up. Known right PCA stroke. EXAM: CT ANGIOGRAPHY HEAD AND NECK TECHNIQUE: Multidetector CT imaging of the head and neck was performed using the standard protocol during bolus administration of intravenous contrast. Multiplanar CT image reconstructions and MIPs were obtained to evaluate the vascular anatomy. Carotid stenosis measurements (when applicable) are obtained utilizing NASCET criteria, using the distal internal carotid diameter as the denominator. CONTRAST:  48mL OMNIPAQUE IOHEXOL 350 MG/ML SOLN COMPARISON:  None. FINDINGS: CTA NECK FINDINGS SKELETON: There is no bony spinal canal stenosis. No lytic or blastic lesion. 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 UPPER CHEST: No pneumothorax or pleural effusion. No nodules or masses. AORTIC ARCH: There is mild calcific atherosclerosis of the aortic arch. There is no aneurysm, dissection or hemodynamically significant stenosis of the visualized portion of the aorta. Conventional 3 vessel aortic branching pattern. The visualized proximal subclavian arteries are widely patent. RIGHT CAROTID SYSTEM: Normal without aneurysm, dissection or stenosis. LEFT CAROTID SYSTEM: Normal without aneurysm, dissection or stenosis. VERTEBRAL ARTERIES: Right dominant configuration. Both origins are clearly patent. There is no  dissection, occlusion or flow-limiting stenosis to the skull base (V1-V3 segments). CTA HEAD FINDINGS POSTERIOR CIRCULATION: --Vertebral arteries: Normal V4 segments. --Posterior inferior cerebellar arteries (PICA): Patent origins from the vertebral arteries. --Anterior inferior cerebellar arteries (AICA): Patent origins from the basilar artery. --Basilar artery: Normal. --Superior cerebellar arteries: Normal. --Posterior cerebral arteries: The right PCA is occluded at the P1 P2 junction. ANTERIOR CIRCULATION: --Intracranial internal carotid arteries:  Atherosclerotic calcification of the internal carotid arteries at the skull base without hemodynamically significant stenosis. --Anterior cerebral arteries (ACA): Normal. Absent right A1 segment, normal variant --Middle cerebral arteries (MCA): The M1 segments are normal. There is multifocal severe stenosis of the M2 segments bilaterally. VENOUS SINUSES: As permitted by contrast timing, patent. ANATOMIC VARIANTS: None Review of the MIP images confirms the above findings. IMPRESSION: 1. 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 2. Bilateral multifocal severe M2 segment stenoses. 3. 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 Histologic sampling should be considered due to the degree of overlap of imaging features between benign and malignant parotid tumors. 4. Aortic Atherosclerosis (ICD10-I70.0). Electronically Signed   By: Ulyses Jarred M.D.   On: 08/27/2019 03:34   CT Head Wo Contrast  Result Date: 08/27/2019 CLINICAL DATA:  History of cocaine abuse.  Status post fall. EXAM: CT HEAD WITHOUT CONTRAST TECHNIQUE: Contiguous axial images were obtained from the base of the skull through the vertex without intravenous contrast. COMPARISON:  Brain MRI 08/27/2019.  Head CT scan 08/26/2019. FINDINGS: Brain: No evidence of hemorrhage, hydrocephalus, extra-axial collection or mass lesion/mass effect. Acute right PCA territory infarct without hemorrhagic transformation is again seen. Atrophy, chronic microvascular ischemic change, remote left ACA infarct and remote bilateral cerebellar infarcts, larger on the right also again seen. Vascular: Atherosclerosis. Skull: Intact.  No focal lesion. Sinuses/Orbits: Negative. Other: None. IMPRESSION: Acute right PCA territory infarct is identified as seen on the comparison examinations. Negative for hemorrhagic transformation. Atrophy, chronic microvascular ischemic change  and remote infarcts as described above. Electronically Signed   By: Inge Rise M.D.   On: 08/27/2019 12:54   CT Head  Result Date: 08/26/2019 CLINICAL DATA:  Status post fall, altered mental status. EXAM: CT HEAD WITHOUT CONTRAST TECHNIQUE: Contiguous axial images were obtained from the base of the skull through the vertex without intravenous contrast. COMPARISON:  Feb 03, 2013 FINDINGS: Brain: There is no midline shift, hydrocephalus, or mass. No acute hemorrhage is identified. There is lucency identified in the right cerebellum, likely due to an infarct, age indeterminate. Old infarct is identified in the left cerebellum. Encephalomalacia of the left frontal lobe is identified consistent with old infarct. Small old lacunar infarctions are identified in bilateral basal ganglia. Vascular: No hyperdense vessel is noted. Skull: Normal. Negative for fracture or focal lesion. Sinuses/Orbits: No acute finding. Other: None. IMPRESSION: 1. There is lucency identified in the right cerebellum, likely due to an infarct, age indeterminate. 2. Old infarcts identified in the left frontal lobe, left cerebellum, and bilateral basal ganglia. Electronically Signed   By: Abelardo Diesel M.D.   On: 08/26/2019 20:05   CT ANGIO NECK W OR WO CONTRAST  Result Date: 08/27/2019 CLINICAL DATA:  Stroke follow-up. Known right PCA stroke. EXAM: CT ANGIOGRAPHY HEAD AND NECK TECHNIQUE: Multidetector CT imaging of the head and neck was performed using the standard protocol during bolus administration of intravenous contrast. Multiplanar CT image reconstructions and MIPs were obtained to  evaluate the vascular anatomy. Carotid stenosis measurements (when applicable) are obtained utilizing NASCET criteria, using the distal internal carotid diameter as the denominator. CONTRAST:  35mL OMNIPAQUE IOHEXOL 350 MG/ML SOLN COMPARISON:  None. FINDINGS: CTA NECK FINDINGS SKELETON: There is no bony spinal canal stenosis. No lytic or blastic  lesion. 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 UPPER CHEST: No pneumothorax or pleural effusion. No nodules or masses. AORTIC ARCH: There is mild calcific atherosclerosis of the aortic arch. There is no aneurysm, dissection or hemodynamically significant stenosis of the visualized portion of the aorta. Conventional 3 vessel aortic branching pattern. The visualized proximal subclavian arteries are widely patent. RIGHT CAROTID SYSTEM: Normal without aneurysm, dissection or stenosis. LEFT CAROTID SYSTEM: Normal without aneurysm, dissection or stenosis. VERTEBRAL ARTERIES: Right dominant configuration. Both origins are clearly patent. There is no dissection, occlusion or flow-limiting stenosis to the skull base (V1-V3 segments). CTA HEAD FINDINGS POSTERIOR CIRCULATION: --Vertebral arteries: Normal V4 segments. --Posterior inferior cerebellar arteries (PICA): Patent origins from the vertebral arteries. --Anterior inferior cerebellar arteries (AICA): Patent origins from the basilar artery. --Basilar artery: Normal. --Superior cerebellar arteries: Normal. --Posterior cerebral arteries: The right PCA is occluded at the P1 P2 junction. ANTERIOR CIRCULATION: --Intracranial internal carotid arteries: Atherosclerotic calcification of the internal carotid arteries at the skull base without hemodynamically significant stenosis. --Anterior cerebral arteries (ACA): Normal. Absent right A1 segment, normal variant --Middle cerebral arteries (MCA): The M1 segments are normal. There is multifocal severe stenosis of the M2 segments bilaterally. VENOUS SINUSES: As permitted by contrast timing, patent. ANATOMIC VARIANTS: None Review of the MIP images confirms the above findings. IMPRESSION: 1. 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 2. Bilateral multifocal severe M2 segment stenoses. 3. Hyperdense mass at the inferior aspect of the right parotid  gland, measuring 1.8 by 1.0 cm, most commonly a Warthin tumor. Histologic sampling should be considered due to the degree of overlap of imaging features between benign and malignant parotid tumors. 4. Aortic Atherosclerosis (ICD10-I70.0). Electronically Signed   By: Ulyses Jarred M.D.   On: 08/27/2019 03:34   MR BRAIN WO CONTRAST  Result Date: 08/27/2019 CLINICAL DATA:  Encephalopathy EXAM: MRI HEAD WITHOUT CONTRAST TECHNIQUE: Multiplanar, multiecho pulse sequences of the brain and surrounding structures were obtained without intravenous contrast. COMPARISON:  Brain MRI 02/03/2013 and head CT 08/26/2019 FINDINGS: Brain: There is a large right PCA territory infarct predominantly affecting the medial temporal lobe and occipital lobe with smaller foci of ischemia in the right thalamus and right middle cerebellar peduncle. There is a large area of encephalomalacia within the left anterior cerebral artery territory. There is diffuse confluent white matter hyperintensity compatible with ischemic microangiopathy. There are old bilateral cerebellar infarcts. Vascular: There is loss of the normal right PCA flow void. There are 10-20 scattered foci of chronic microhemorrhage in a predominantly peripheral distribution. No acute hemorrhage. Skull and upper cervical spine: The bone marrow signal of the cranium and upper cervical vertebrae is normal. There is no skull base lesion. The visualized upper cervical spinal cord is normal. Sinuses/Orbits: There is no paranasal sinus fluid level or advanced mucosal thickening. There is no mastoid or middle ear effusion. The orbits are normal. Other: None IMPRESSION: 1. Large, acute right PCA territory infarct involving the medial temporal lobe and occipital lobe with additional smaller foci of acute ischemia within the right thalamus and right middle cerebellar peduncle. 2. Loss of the normal right  PCA flow void consistent with occlusion. 3. Multiple foci of chronic microhemorrhage  in a predominantly peripheral distribution. 4. No acute hemorrhage or mass effect. Electronically Signed   By: Ulyses Jarred M.D.   On: 08/27/2019 02:13   XR Chest Single View  Result Date: 08/26/2019 CLINICAL DATA:  Altered behavior, confusion and falls today EXAM: PORTABLE CHEST 1 VIEW COMPARISON:  Radiograph Feb 03, 2013 FINDINGS: Nonspecific elevation of the right hemidiaphragm with adjacent opacity likely reflecting atelectasis. No consolidation, convincing features of edema, pneumothorax or effusion. The aorta is calcified. The remaining cardiomediastinal contours are unremarkable. Degenerative changes are present in the imaged spine and shoulders. No acute osseous or soft tissue abnormality. IMPRESSION: 1. Nonspecific elevation of the right hemidiaphragm with adjacent opacity likely reflecting atelectasis. 2.  No other acute cardiopulmonary abnormality. Electronically Signed   By: Lovena Le M.D.   On: 08/26/2019 20:30   ECHOCARDIOGRAM COMPLETE  Result Date: 08/27/2019   ECHOCARDIOGRAM REPORT   Patient Name:   MAVRYCK COGBURN Date of Exam: 08/27/2019 Medical Rec #:  [id number]     Height:       69.0 in Accession #:    [id number]    Weight:       188.0 lb Date of Birth:  1956-05-22     BSA:          2.01 m Patient Age:    18 years      BP:           132/101 mmHg Patient Gender: M             HR:           61 bpm. Exam Location:  Inpatient Procedure: 2D Echo Indications:    Stroke 434.91/I163.9  History:        Patient has prior history of Echocardiogram examinations, most                 recent 02/09/2013. Stroke; Risk Factors:Hypertension.  Sonographer:    Clayton Lefort RDCS (AE) Referring Phys: [id number] Jani Gravel  Sonographer Comments: Patient unable to hold breath when requested. IMPRESSIONS  1. Left ventricular ejection fraction, by visual estimation, is 65 to 70%. The left ventricle has normal function. There is severely increased left ventricular hypertrophy.  2. Left ventricular diastolic parameters  are consistent with Grade I diastolic dysfunction (impaired relaxation).  3. The left ventricle has no regional wall motion abnormalities.  4. Global right ventricle has normal systolic function.The right ventricular size is normal. No increase in right ventricular wall thickness.  5. Left atrial size was normal.  6. Right atrial size was normal.  7. The mitral valve is normal in structure. Trivial mitral valve regurgitation.  8. The tricuspid valve is normal in structure. Tricuspid valve regurgitation is not demonstrated.  9. The aortic valve is normal in structure. Aortic valve regurgitation is not visualized. Mild aortic valve sclerosis without stenosis. 10. The pulmonic valve was grossly normal. Pulmonic valve regurgitation is not visualized. 11. The atrial septum is grossly normal. FINDINGS  Left Ventricle: Left ventricular ejection fraction, by visual estimation, is 65 to 70%. The left ventricle has normal function. The left ventricle has no regional wall motion abnormalities. There is severely increased left ventricular hypertrophy. Asymmetric left ventricular hypertrophy. Left ventricular diastolic parameters are consistent with Grade I diastolic dysfunction (impaired relaxation). Right Ventricle: The right ventricular size is normal. No increase in right ventricular wall thickness. Global RV systolic function is has normal systolic function. Left Atrium:  Left atrial size was normal in size. Right Atrium: Right atrial size was normal in size Pericardium: There is no evidence of pericardial effusion. Mitral Valve: The mitral valve is normal in structure. Trivial mitral valve regurgitation. Tricuspid Valve: The tricuspid valve is normal in structure. Tricuspid valve regurgitation is not demonstrated. Aortic Valve: The aortic valve is normal in structure. Aortic valve regurgitation is not visualized. Mild aortic valve sclerosis is present, with no evidence of aortic valve stenosis. Aortic valve mean gradient  measures 4.0 mmHg. Aortic valve peak gradient measures 7.2 mmHg. Aortic valve area, by VTI measures 2.46 cm. Pulmonic Valve: The pulmonic valve was grossly normal. Pulmonic valve regurgitation is not visualized. Pulmonic regurgitation is not visualized. Aorta: The aortic root and ascending aorta are structurally normal, with no evidence of dilitation. IAS/Shunts: The atrial septum is grossly normal.  LEFT VENTRICLE PLAX 2D LVIDd:         3.90 cm  Diastology LVIDs:         2.20 cm  LV e' lateral:   6.53 cm/s LV PW:         1.40 cm  LV E/e' lateral: 9.3 LV IVS:        1.90 cm  LV e' medial:    5.87 cm/s LVOT diam:     2.00 cm  LV E/e' medial:  10.4 LV SV:         50 ml LV SV Index:   24.19 LVOT Area:     3.14 cm  RIGHT VENTRICLE RV S prime:     11.00 cm/s TAPSE (M-mode): 1.7 cm LEFT ATRIUM           Index       RIGHT ATRIUM           Index LA diam:      2.70 cm 1.34 cm/m  RA Area:     15.00 cm LA Vol (A2C): 65.5 ml 32.55 ml/m RA Volume:   36.00 ml  17.89 ml/m LA Vol (A4C): 32.6 ml 16.20 ml/m  AORTIC VALVE AV Area (Vmax):    2.67 cm AV Area (Vmean):   2.63 cm AV Area (VTI):     2.46 cm AV Vmax:           134.00 cm/s AV Vmean:          91.000 cm/s AV VTI:            0.290 m AV Peak Grad:      7.2 mmHg AV Mean Grad:      4.0 mmHg LVOT Vmax:         114.00 cm/s LVOT Vmean:        76.100 cm/s LVOT VTI:          0.227 m LVOT/AV VTI ratio: 0.78  AORTA Ao Root diam: 3.40 cm Ao Asc diam:  2.90 cm MITRAL VALVE MV Area (PHT): 2.66 cm             SHUNTS MV PHT:        82.65 msec           Systemic VTI:  0.23 m MV Decel Time: 285 msec             Systemic Diam: 2.00 cm MV E velocity: 61.00 cm/s 103 cm/s MV A velocity: 84.40 cm/s 70.3 cm/s MV E/A ratio:  0.72       1.5  Mertie Moores MD Electronically signed by Mertie Moores MD Signature Date/Time: 08/27/2019/12:29:31 PM  Final        Subjective: Patient seen and examined at bedside.  He is sleepy, wakes up slightly, hardly answers any questions.  No overnight  fever or vomiting reported.  Discharge Exam: Vitals:   09/15/19 2219 09/16/19 0804  BP: (!) 153/88 (!) 144/101  Pulse: 88 82  Resp: 18 17  Temp: 98.2 F (36.8 C) (!) 97.3 F (36.3 C)  SpO2: 100% 98%    General: Pt is sleepy, wakes up slightly, hardly answers any questions. Leg.   Cardiovascular: rate controlled, S1/S2 + Respiratory: bilateral decreased breath sounds at bases Abdominal: Soft, NT, ND, bowel sounds + Extremities: no edema, no cyanosis    The results of significant diagnostics from this hospitalization (including imaging, microbiology, ancillary and laboratory) are listed below for reference.     Microbiology: Recent Results (from the past 240 hour(s))  SARS CORONAVIRUS 2 (TAT 6-24 HRS) Nasopharyngeal Nasopharyngeal Swab     Status: None   Collection Time: 09/15/19 12:54 PM   Specimen: Nasopharyngeal Swab  Result Value Ref Range Status   SARS Coronavirus 2 NEGATIVE NEGATIVE Final    Comment: (NOTE) SARS-CoV-2 target nucleic acids are NOT DETECTED. The SARS-CoV-2 RNA is generally detectable in upper and lower respiratory specimens during the acute phase of infection. Negative results do not preclude SARS-CoV-2 infection, do not rule out co-infections with other pathogens, and should not be used as the sole basis for treatment or other patient management decisions. Negative results must be combined with clinical observations, patient history, and epidemiological information. The expected result is Negative. Fact Sheet for Patients: SugarRoll.be Fact Sheet for Healthcare Providers: https://www.woods-mathews.com/ This test is not yet approved or cleared by the [place] FDA and  has been authorized for detection and/or diagnosis of SARS-CoV-2 by FDA under an Emergency Use Authorization (EUA). This EUA will remain  in effect (meaning this test can be used) for the duration of the COVID-19 declaration under Section  56 4(b)(1) of the Act, 21 U.S.C. section 360bbb-3(b)(1), unless the authorization is terminated or revoked sooner. Performed at [facility], [place] [address] [place], [place] [address]      Labs: BNP (last 3 results) No results for input(s): BNP in the last 8760 hours. Basic Metabolic Panel: Recent Labs  Lab 09/12/19 1144 09/13/19 0546 09/14/19 0332 09/15/19 0730 09/16/19 0327  NA 146* 148* 146* 143 142  K 4.2 3.8 4.2 4.2 3.8  CL 109 108 108 107 104  CO2 26 27 26 25 27   GLUCOSE 132* 90 116* 143* 130*  BUN 28* 27* 23 21 19   CREATININE 0.97 0.98 0.88 0.84 0.80  CALCIUM 9.3 9.4 9.5 9.1 9.2  MG  --   --   --   --  2.2   Liver Function Tests: No results for input(s): AST, ALT, ALKPHOS, BILITOT, PROT, ALBUMIN in the last 168 hours. No results for input(s): LIPASE, AMYLASE in the last 168 hours. No results for input(s): AMMONIA in the last 168 hours. CBC: Recent Labs  Lab 09/10/19 0148 09/13/19 0546 09/14/19 0332 09/16/19 0327  WBC 11.9* 12.4* 10.1 10.5  NEUTROABS  --   --   --  7.0  HGB 17.2* 17.3* 16.9 15.7  HCT 54.3* 54.0* 52.4* 49.1  MCV 87.9 85.9 86.0 86.0  PLT 272 377 352 353   Cardiac Enzymes: No results for input(s): CKTOTAL, CKMB, CKMBINDEX, TROPONINI in the last 168 hours. BNP: Invalid input(s): POCBNP CBG: No results for input(s): GLUCAP in the last 168  hours. D-Dimer No results for input(s): DDIMER in the last 72 hours. Hgb A1c No results for input(s): HGBA1C in the last 72 hours. Lipid Profile No results for input(s): CHOL, HDL, LDLCALC, TRIG, CHOLHDL, LDLDIRECT in the last 72 hours. Thyroid function studies No results for input(s): TSH, T4TOTAL, T3FREE, THYROIDAB in the last 72 hours.  Invalid input(s): FREET3 Anemia work up No results for input(s): VITAMINB12, FOLATE, FERRITIN, TIBC, IRON, RETICCTPCT in the last 72 hours. Urinalysis    Component Value Date/Time   COLORURINE YELLOW 08/26/2019 1933   APPEARANCEUR CLEAR 08/26/2019 1933    LABSPEC 1.024 08/26/2019 1933   PHURINE 5.0 08/26/2019 1933   GLUCOSEU NEGATIVE 08/26/2019 [place] NEGATIVE 08/26/2019 [place] NEGATIVE 08/26/2019 1933   KETONESUR 20 (A) 08/26/2019 1933   PROTEINUR NEGATIVE 08/26/2019 1933   UROBILINOGEN 0.2 02/04/2013 1315   NITRITE NEGATIVE 08/26/2019 1933   LEUKOCYTESUR NEGATIVE 08/26/2019 1933   Sepsis Labs Invalid input(s): PROCALCITONIN,  WBC,  LACTICIDVEN Microbiology Recent Results (from the past 240 hour(s))  SARS CORONAVIRUS 2 (TAT 6-24 HRS) Nasopharyngeal Nasopharyngeal Swab     Status: None   Collection Time: 09/15/19 12:54 PM   Specimen: Nasopharyngeal Swab  Result Value Ref Range Status   SARS Coronavirus 2 NEGATIVE NEGATIVE Final    Comment: (NOTE) SARS-CoV-2 target nucleic acids are NOT DETECTED. The SARS-CoV-2 RNA is generally detectable in upper and lower respiratory specimens during the acute phase of infection. Negative results do not preclude SARS-CoV-2 infection, do not rule out co-infections with other pathogens, and should not be used as the sole basis for treatment or other patient management decisions. Negative results must be combined with clinical observations, patient history, and epidemiological information. The expected result is Negative. Fact Sheet for Patients: SugarRoll.be Fact Sheet for Healthcare Providers: https://www.woods-mathews.com/ This test is not yet approved or cleared by the [place] FDA and  has been authorized for detection and/or diagnosis of SARS-CoV-2 by FDA under an Emergency Use Authorization (EUA). This EUA will remain  in effect (meaning this test can be used) for the duration of the COVID-19 declaration under Section 56 4(b)(1) of the Act, 21 U.S.C. section 360bbb-3(b)(1), unless the authorization is terminated or revoked sooner. Performed at [facility], [place] [address], [place], [place] [address]      Time  coordinating discharge: 35 minutes  SIGNED:   Aline August, MD  [facility] 09/16/2019, 10:11 AM

## 2019-10-26 ENCOUNTER — Other Ambulatory Visit: Payer: Self-pay

## 2019-10-26 ENCOUNTER — Telehealth: Payer: Self-pay

## 2019-10-26 ENCOUNTER — Ambulatory Visit (INDEPENDENT_AMBULATORY_CARE_PROVIDER_SITE_OTHER): Payer: No Typology Code available for payment source | Admitting: Neurology

## 2019-10-26 ENCOUNTER — Encounter: Payer: Self-pay | Admitting: Neurology

## 2019-10-26 VITALS — BP 122/84 | HR 70 | Temp 97.4°F | Ht 69.0 in | Wt 162.0 lb

## 2019-10-26 DIAGNOSIS — I6621 Occlusion and stenosis of right posterior cerebral artery: Secondary | ICD-10-CM | POA: Diagnosis not present

## 2019-10-26 DIAGNOSIS — H53462 Homonymous bilateral field defects, left side: Secondary | ICD-10-CM

## 2019-10-26 DIAGNOSIS — F0150 Vascular dementia without behavioral disturbance: Secondary | ICD-10-CM | POA: Diagnosis not present

## 2019-10-26 NOTE — Telephone Encounter (Signed)
DR. Leonie Man wanted pt have a permament handicap placard.The wife was given form for DMV. She appreciate having it for her husband.

## 2019-10-26 NOTE — Progress Notes (Signed)
[facility] [address] [place]. [place] [address] [phone]       OFFICE FOLLOW-UP NOTE  [name] Date of Birth:  [birth year] Medical Record Number:  [id number]   HPI: [name] is a 64 year old Caucasian male seen today for initial office follow-up visit following hospital admission for stroke in December 2020.  He is accompanied by his wife.  History is obtained from them and review of electronic medical records and I personally reviewed imaging films in PACS.. [name] with past medical history of hypertension, psoriasis and remote smoking quit 6 years ago who presented to [facility] on 08/27/2019 for altered mental status but exact time of onset was unclear.  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  Transthoracic echo showed normal ejection fraction.  LDL cholesterol was elevated at 107 and hemoglobin A1c was 6.2.  Urine drug screen was positive for cocaine.  Patient had previously had a stroke in May 2014 with left ACA occlusion at that time urine drug screen was positive for marijuana and MRI showed left ACA occlusion 2D echo showed normal ejection fraction.  TEE was apparently done but result was not obtainable.  Patient was started on dual antiplatelet therapy aspirin and Plavix and transferred to inpatient rehab at skilled nursing facility which she finished is currently living at home for the last 9 days.  Is home physical and occupational and speech therapy have not yet started.  He is able to walk with a walker but still requires 1 person assist at the nursing facility.  Is spending most  of his time in a wheelchair at home at present.  He also has poor peripheral vision but his short-term memory and cognitive abilities have also declined.  He requires help for most activities of daily living.  Is tolerating aspirin and Plavix well without bruising or bleeding.  He has quit cocaine since his stroke.  He has an upcoming appointment to see his primary care physician soon.  [name] is requesting a handicap parking sticker for him ROS:   14 system review of systems is positive for vision loss, memory loss, incoordination, weakness and all other systems negative  PMH:  Past Medical History:  Diagnosis Date  . Anginal pain ([place])   . Heart murmur   . Hypertension   . Psoriasis    "back and stomach" (02/03/2013)  . Stroke ([place]) 02/01/2013   left frontal CVA and symptoms of right arm and leg weakness and expressive aphasia/notes 02/03/2013 (02/03/2013)    Social History:  Social History   Socioeconomic History  . Marital status: Married    Spouse name: Not on file  . Number of children: 2  . Years of education: 32  . Highest education level: Not on file  Occupational History  . Occupation: N/A  Tobacco Use  . Smoking status: Former Smoker    Packs/day: 0.25    Years: 37.00    Pack years: 9.25    Types: Cigarettes    Quit date: 04/13/2013    Years since quitting: 6.5  . Smokeless tobacco: Never Used  . Tobacco comment: using patch to quit  Substance  and Sexual Activity  . Alcohol use: Yes    Alcohol/week: 17.0 standard drinks    Types: 17 Shots of liquor per week    Comment: 02/03/2013 "about 1/5th of grey goose/wk"  . Drug use: Yes    Types: Cocaine    Comment: prior to stroke/hospitalization  . Sexual activity: Yes    Partners: Female  Other Topics Concern  . Not on file  Social History Narrative   Right handed    Lives with wife   Social Determinants of Health   Financial Resource Strain:   . Difficulty of Paying Living Expenses: Not on file  Food  Insecurity:   . Worried About Charity fundraiser in the Last Year: Not on file  . Ran Out of Food in the Last Year: Not on file  Transportation Needs:   . Lack of Transportation (Medical): Not on file  . Lack of Transportation (Non-Medical): Not on file  Physical Activity:   . Days of Exercise per Week: Not on file  . Minutes of Exercise per Session: Not on file  Stress:   . Feeling of Stress : Not on file  Social Connections:   . Frequency of Communication with Friends and Family: Not on file  . Frequency of Social Gatherings with Friends and Family: Not on file  . Attends Religious Services: Not on file  . Active Member of Clubs or Organizations: Not on file  . Attends Archivist Meetings: Not on file  . Marital Status: Not on file  Intimate Partner Violence:   . Fear of Current or Ex-Partner: Not on file  . Emotionally Abused: Not on file  . Physically Abused: Not on file  . Sexually Abused: Not on file    Medications:   Current Outpatient Medications on File Prior to Visit  Medication Sig Dispense Refill  . amLODipine (NORVASC) 10 MG tablet Take 1 tablet (10 mg total) by mouth daily. 30 tablet 0  . aspirin 325 MG tablet Take 1 tablet (325 mg total) by mouth daily.    Marland Kitchen atorvastatin (LIPITOR) 80 MG tablet Take 1 tablet (80 mg total) by mouth daily at 6 PM. 30 tablet 0  . carvedilol (COREG) 6.25 MG tablet Take 1 tablet (6.25 mg total) by mouth 2 (two) times daily with a meal. 30 tablet 0  . clopidogrel (PLAVIX) 75 MG tablet Take 1 tablet (75 mg total) by mouth daily. 30 tablet 0  . lisinopril (ZESTRIL) 30 MG tablet Take 1 tablet (30 mg total) by mouth daily. 30 tablet 0  . senna-docusate (SENOKOT-S) 8.6-50 MG tablet Take 1 tablet by mouth 2 (two) times daily. 30 tablet 0   No current facility-administered medications on file prior to visit.    Allergies:   Allergies  Allergen Reactions  . Shellfish Allergy Itching    Physical Exam General: Frail cachectic  looking middle-aged Caucasian male, seated, in no evident distress Head: head normocephalic and atraumatic.  Neck: supple with no carotid or supraclavicular bruits Cardiovascular: regular rate and rhythm, no murmurs Musculoskeletal: no deformity Skin:  no rash/petichiae Vascular:  Normal pulses all extremities Vitals:   10/26/19 1002  BP: 122/84  Pulse: 70  Temp: (!) 97.4 F (36.3 C)   Neurologic Exam Mental Status: Awake and fully alert. Oriented to place and time. Recent and remote memory poor t. Attention span, concentration and fund of knowledge diminished. Mood and affect appropriate.  Decreased recall 1/3. Cranial Nerves: Fundoscopic exam reveals sharp disc margins. Pupils  equal, briskly reactive to light. Extraocular movements full without nystagmus but has right gaze preference. Visual fields show dense left homonymous hemianopsia to confrontation. Hearing mildly diminished bilaterally.. Facial sensation intact.  Mild lower left facial weakness., tongue, palate moves normally and symmetrically.  Motor: Normal bulk and tone. Normal strength in all tested extremity muscles except mild left hemiparesis 4/5 strength with weakness of left grip and intrinsic hand muscles.  Orbits right over left upper extremity.  Mild weakness of her left hip flexors and ankle dorsiflexors.. Sensory.: intact to touch ,pinprick .position and vibratory sensation.  Coordination: Mildly impaired in the left upper and lower extremities and normal in the right. Gait and Station: Sitting in a wheelchair.  Gait not assessed as patient is a 1 person assist even with a walker. Reflexes: 1+ and symmetric. Toes downgoing.   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  Patient has significant cognitive impairment and dense persistent left homonymous hemianopsia     PLAN: I had a long d/w patient and his wife about his recent stroke,vascular dementia, vision and gait difficulties risk for recurrent stroke/TIAs, personally independently reviewed imaging studies and stroke evaluation results and answered questions.Continue aspirin 81 mg and Plavix 75 mg daily for 1 more month and then stop aspirin and stay on Plavix alone daily  for secondary stroke prevention and maintain strict control of hypertension with blood pressure goal below 130/90, diabetes with hemoglobin A1c goal below 6.5% and lipids with LDL cholesterol goal below 70 mg/dL. I also advised the patient to eat a healthy diet with plenty of whole grains, cereals, fruits and vegetables, exercise regularly and maintain ideal body weight .continue scheduled home physical occupational and speech therapy.  Patient was given a handicap parking sticker.  He will need 24-hour supervision at home.  Followup in the future with my nurse practitioner Janett Billow in 3 months or call earlier if necessary. Greater than 50% of time during this 35 minute visit was spent on counseling,explanation of diagnosis of stroke, cognitive impairment planning of further management, discussion with patient and family and coordination of care Antony Contras, MD  [facility] [address] [place], [place] [address]  Phone [phone] Fax [phone] Note: This document was prepared with digital dictation and possible smart phrase technology. Any transcriptional errors that result from this process are unintentional

## 2019-10-26 NOTE — Patient Instructions (Signed)
I had a long d/w patient and his wife about his recent stroke,vascular dementia, vision and gait difficulties risk for recurrent stroke/TIAs, personally independently reviewed imaging studies and stroke evaluation results and answered questions.Continue aspirin 81 mg and Plavix 75 mg daily for 1 more month and then stop aspirin and stay on Plavix alone daily  for secondary stroke prevention and maintain strict control of hypertension with blood pressure goal below 130/90, diabetes with hemoglobin A1c goal below 6.5% and lipids with LDL cholesterol goal below 70 mg/dL. I also advised the patient to eat a healthy diet with plenty of whole grains, cereals, fruits and vegetables, exercise regularly and maintain ideal body weight .continue scheduled home physical occupational and speech therapy.  Patient was given a handicap parking sticker.  He will need 24-hour supervision at home.  Followup in the future with my nurse practitioner Janett Billow in 3 months or call earlier if necessary.  Stroke Prevention Some medical conditions and behaviors are associated with a higher chance of having a stroke. You can help prevent a stroke by making nutrition, lifestyle, and other changes, including managing any medical conditions you may have. What nutrition changes can be made?   Eat healthy foods. You can do this by: ? Choosing foods high in fiber, such as fresh fruits and vegetables and whole grains. ? Eating at least 5 or more servings of fruits and vegetables a day. Try to fill half of your plate at each meal with fruits and vegetables. ? Choosing lean protein foods, such as lean cuts of meat, poultry without skin, fish, tofu, beans, and nuts. ? Eating low-fat dairy products. ? Avoiding foods that are high in salt (sodium). This can help lower blood pressure. ? Avoiding foods that have saturated fat, trans fat, and cholesterol. This can help prevent high cholesterol. ? Avoiding processed and premade foods.  Follow  your health care provider's specific guidelines for losing weight, controlling high blood pressure (hypertension), lowering high cholesterol, and managing diabetes. These may include: ? Reducing your daily calorie intake. ? Limiting your daily sodium intake to 1,500 milligrams (mg). ? Using only healthy fats for cooking, such as olive oil, canola oil, or sunflower oil. ? Counting your daily carbohydrate intake. What lifestyle changes can be made?  Maintain a healthy weight. Talk to your health care provider about your ideal weight.  Get at least 30 minutes of moderate physical activity at least 5 days a week. Moderate activity includes brisk walking, biking, and swimming.  Do not use any products that contain nicotine or tobacco, such as cigarettes and e-cigarettes. If you need help quitting, ask your health care provider. It may also be helpful to avoid exposure to secondhand smoke.  Limit alcohol intake to no more than 1 drink a day for nonpregnant women and 2 drinks a day for men. One drink equals 12 oz of beer, 5 oz of wine, or 1 oz of hard liquor.  Stop any illegal drug use.  Avoid taking birth control pills. Talk to your health care provider about the risks of taking birth control pills if: ? You are over 54 years old. ? You smoke. ? You get migraines. ? You have ever had a blood clot. What other changes can be made?  Manage your cholesterol levels. ? Eating a healthy diet is important for preventing high cholesterol. If cholesterol cannot be managed through diet alone, you may also need to take medicines. ? Take any prescribed medicines to control your cholesterol as told by  your health care provider.  Manage your diabetes. ? Eating a healthy diet and exercising regularly are important parts of managing your blood sugar. If your blood sugar cannot be managed through diet and exercise, you may need to take medicines. ? Take any prescribed medicines to control your diabetes as  told by your health care provider.  Control your hypertension. ? To reduce your risk of stroke, try to keep your blood pressure below 130/80. ? Eating a healthy diet and exercising regularly are an important part of controlling your blood pressure. If your blood pressure cannot be managed through diet and exercise, you may need to take medicines. ? Take any prescribed medicines to control hypertension as told by your health care provider. ? Ask your health care provider if you should monitor your blood pressure at home. ? Have your blood pressure checked every year, even if your blood pressure is normal. Blood pressure increases with age and some medical conditions.  Get evaluated for sleep disorders (sleep apnea). Talk to your health care provider about getting a sleep evaluation if you snore a lot or have excessive sleepiness.  Take over-the-counter and prescription medicines only as told by your health care provider. Aspirin or blood thinners (antiplatelets or anticoagulants) may be recommended to reduce your risk of forming blood clots that can lead to stroke.  Make sure that any other medical conditions you have, such as atrial fibrillation or atherosclerosis, are managed. What are the warning signs of a stroke? The warning signs of a stroke can be easily remembered as BEFAST.  B is for balance. Signs include: ? Dizziness. ? Loss of balance or coordination. ? Sudden trouble walking.  E is for eyes. Signs include: ? A sudden change in vision. ? Trouble seeing.  F is for face. Signs include: ? Sudden weakness or numbness of the face. ? The face or eyelid drooping to one side.  A is for arms. Signs include: ? Sudden weakness or numbness of the arm, usually on one side of the body.  S is for speech. Signs include: ? Trouble speaking (aphasia). ? Trouble understanding.  T is for time. ? These symptoms may represent a serious problem that is an emergency. Do not wait to see if  the symptoms will go away. Get medical help right away. Call your local emergency services (911 in the [place] Do not drive yourself to the hospital.  Other signs of stroke may include: ? A sudden, severe headache with no known cause. ? Nausea or vomiting. ? Seizure. Where to find more information For more information, visit:  American Stroke Association: www.strokeassociation.org  National Stroke Association: www.stroke.org Summary  You can prevent a stroke by eating healthy, exercising, not smoking, limiting alcohol intake, and managing any medical conditions you may have.  Do not use any products that contain nicotine or tobacco, such as cigarettes and e-cigarettes. If you need help quitting, ask your health care provider. It may also be helpful to avoid exposure to secondhand smoke.  Remember BEFAST for warning signs of stroke. Get help right away if you or a loved one has any of these signs. This information is not intended to replace advice given to you by your health care provider. Make sure you discuss any questions you have with your health care provider. Document Revised: 08/15/2017 Document Reviewed: 10/08/2016 Elsevier Patient Education  2020 [employer].

## 2020-02-01 ENCOUNTER — Ambulatory Visit: Payer: No Typology Code available for payment source | Admitting: Neurology

## 2020-03-01 ENCOUNTER — Encounter: Payer: Self-pay | Admitting: Neurology

## 2020-03-01 ENCOUNTER — Other Ambulatory Visit: Payer: Self-pay

## 2020-03-01 ENCOUNTER — Ambulatory Visit (INDEPENDENT_AMBULATORY_CARE_PROVIDER_SITE_OTHER): Payer: No Typology Code available for payment source | Admitting: Neurology

## 2020-03-01 VITALS — BP 125/83 | HR 66 | Ht 69.0 in

## 2020-03-01 DIAGNOSIS — H53462 Homonymous bilateral field defects, left side: Secondary | ICD-10-CM | POA: Diagnosis not present

## 2020-03-01 DIAGNOSIS — I6990 Unspecified sequelae of unspecified cerebrovascular disease: Secondary | ICD-10-CM | POA: Diagnosis not present

## 2020-03-01 NOTE — Patient Instructions (Signed)
I had a long d/w patient  And his wifeabout his recent stroke, risk for recurrent stroke/TIAs, personally independently reviewed imaging studies and stroke evaluation results and answered questions.Continue aspirin 325 mg daily  for secondary stroke prevention and maintain strict control of hypertension with blood pressure goal below 130/90, diabetes with hemoglobin A1c goal below 6.5% and lipids with LDL cholesterol goal below 70 mg/dL. I also advised the patient to eat a healthy diet with plenty of whole grains, cereals, fruits and vegetables, exercise regularly and maintain ideal body weight.  He was encouraged to walk with a walker with 1 person assist and we discussed fall prevention precautions.  Followup in the future with my nurse practitioner Janett Billow in 6 months or call earlier if necessary.  Fall Prevention in the Home, Adult Falls can cause injuries. They can happen to people of all ages. There are many things you can do to make your home safe and to help prevent falls. Ask for help when making these changes, if needed. What actions can I take to prevent falls? General Instructions  Use good lighting in all rooms. Replace any light bulbs that burn out.  Turn on the lights when you go into a dark area. Use night-lights.  Keep items that you use often in easy-to-reach places. Lower the shelves around your home if necessary.  Set up your furniture so you have a clear path. Avoid moving your furniture around.  Do not have throw rugs and other things on the floor that can make you trip.  Avoid walking on wet floors.  If any of your floors are uneven, fix them.  Add color or contrast paint or tape to clearly mark and help you see: ? Any grab bars or handrails. ? First and last steps of stairways. ? Where the edge of each step is.  If you use a stepladder: ? Make sure that it is fully opened. Do not climb a closed stepladder. ? Make sure that both sides of the stepladder are locked  into place. ? Ask someone to hold the stepladder for you while you use it.  If there are any pets around you, be aware of where they are. What can I do in the bathroom?      Keep the floor dry. Clean up any water that spills onto the floor as soon as it happens.  Remove soap buildup in the tub or shower regularly.  Use non-skid mats or decals on the floor of the tub or shower.  Attach bath mats securely with double-sided, non-slip rug tape.  If you need to sit down in the shower, use a plastic, non-slip stool.  Install grab bars by the toilet and in the tub and shower. Do not use towel bars as grab bars. What can I do in the bedroom?  Make sure that you have a light by your bed that is easy to reach.  Do not use any sheets or blankets that are too big for your bed. They should not hang down onto the floor.  Have a firm chair that has side arms. You can use this for support while you get dressed. What can I do in the kitchen?  Clean up any spills right away.  If you need to reach something above you, use a strong step stool that has a grab bar.  Keep electrical cords out of the way.  Do not use floor polish or wax that makes floors slippery. If you must use wax, use  non-skid floor wax. What can I do with my stairs?  Do not leave any items on the stairs.  Make sure that you have a light switch at the top of the stairs and the bottom of the stairs. If you do not have them, ask someone to add them for you.  Make sure that there are handrails on both sides of the stairs, and use them. Fix handrails that are broken or loose. Make sure that handrails are as long as the stairways.  Install non-slip stair treads on all stairs in your home.  Avoid having throw rugs at the top or bottom of the stairs. If you do have throw rugs, attach them to the floor with carpet tape.  Choose a carpet that does not hide the edge of the steps on the stairway.  Check any carpeting to make sure  that it is firmly attached to the stairs. Fix any carpet that is loose or worn. What can I do on the outside of my home?  Use bright outdoor lighting.  Regularly fix the edges of walkways and driveways and fix any cracks.  Remove anything that might make you trip as you walk through a door, such as a raised step or threshold.  Trim any bushes or trees on the path to your home.  Regularly check to see if handrails are loose or broken. Make sure that both sides of any steps have handrails.  Install guardrails along the edges of any raised decks and porches.  Clear walking paths of anything that might make someone trip, such as tools or rocks.  Have any leaves, snow, or ice cleared regularly.  Use sand or salt on walking paths during winter.  Clean up any spills in your garage right away. This includes grease or oil spills. What other actions can I take?  Wear shoes that: ? Have a low heel. Do not wear high heels. ? Have rubber bottoms. ? Are comfortable and fit you well. ? Are closed at the toe. Do not wear open-toe sandals.  Use tools that help you move around (mobility aids) if they are needed. These include: ? Canes. ? Walkers. ? Scooters. ? Crutches.  Review your medicines with your doctor. Some medicines can make you feel dizzy. This can increase your chance of falling. Ask your doctor what other things you can do to help prevent falls. Where to find more information  Centers for Disease Control and Prevention, STEADI: https://garcia.biz/  [employer] on Aging: BrainJudge.co.uk Contact a doctor if:  You are afraid of falling at home.  You feel weak, drowsy, or dizzy at home.  You fall at home. Summary  There are many simple things that you can do to make your home safe and to help prevent falls.  Ways to make your home safe include removing tripping hazards and installing grab bars in the bathroom.  Ask for help when making these changes in  your home. This information is not intended to replace advice given to you by your health care provider. Make sure you discuss any questions you have with your health care provider. Document Revised: 12/24/2018 Document Reviewed: 04/17/2017 Elsevier Patient Education  2020 [employer].

## 2020-03-01 NOTE — Progress Notes (Signed)
[facility] [address] [place]. [place] [address] [phone]       OFFICE FOLLOW-UP NOTE  [name] Date of Birth:  Mar 28, 1956 Medical Record Number:  [id number]   HPI:  Initial visit 10/26/19 ;[name] is a 64 year old  male seen today for initial office follow-up visit following hospital admission for stroke in December 2020.  He is accompanied by his wife.  History is obtained from them and review of electronic medical records and I personally reviewed imaging films in PACS.. [name] with past medical history of hypertension, psoriasis and remote smoking quit 6 years ago who presented to [facility] on 08/27/2019 for altered mental status but exact time of onset was unclear.  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  Transthoracic echo showed normal ejection fraction.  LDL cholesterol was elevated at 107 and hemoglobin A1c was 6.2.  Urine drug screen was positive for cocaine.  Patient had previously had a stroke in May 2014 with left ACA occlusion at that time urine drug screen was positive for marijuana and MRI showed left ACA occlusion 2D echo showed normal ejection fraction.  TEE was apparently done but result was not obtainable.  Patient was started on dual antiplatelet therapy aspirin and Plavix and transferred to inpatient rehab at skilled nursing facility which she finished is currently living at home for the last 9 days.  Is home physical and occupational and speech therapy have not yet started.  He is able to walk with a walker but still requires 1 person assist at the nursing facility.  Is  spending most of his time in a wheelchair at home at present.  He also has poor peripheral vision but his short-term memory and cognitive abilities have also declined.  He requires help for most activities of daily living.  Is tolerating aspirin and Plavix well without bruising or bleeding.  He has quit cocaine since his stroke.  He has an upcoming appointment to see his primary care physician soon.  [name] is requesting a handicap parking sticker for him. Update 03/01/2020 : He returns for follow-up after last visit 4 months ago.  Is accompanied by his wife.  She has noticed slight improvement in his thought process with his memory remains poor.  He has persistent left-sided vision loss which has not improved.  He needs a lot of assistance to walk and walks with a walker with holding onto his wife but does not walk long distances.  He can feed himself but needs help with most other activities.  He is tolerating aspirin well without upset stomach, bruising or bleeding.  His blood pressure is well controlled today it is 125/83.  He remains on Lipitor which is tolerating well without muscle aches and pains.  He has no new complaints today. ROS:   14 system review of systems is positive for vision loss, memory loss, incoordination, weakness and all other systems negative  PMH:  Past Medical History:  Diagnosis Date  . Anginal pain ([place])   . Heart murmur   . Hypertension   . Psoriasis    "back and stomach" (02/03/2013)  . Stroke [facility])  02/01/2013   left frontal CVA and symptoms of right arm and leg weakness and expressive aphasia/notes 02/03/2013 (02/03/2013)    Social History:  Social History   Socioeconomic History  . Marital status: Married    Spouse name: Not on file  . Number of children: 2  . Years of education: 37  . Highest education level: Not on file  Occupational History  . Occupation: N/A  Tobacco Use  . Smoking status: Former Smoker    Packs/day: 0.25    Years: 37.00     Pack years: 9.25    Types: Cigarettes    Quit date: 04/13/2013    Years since quitting: 6.8  . Smokeless tobacco: Never Used  . Tobacco comment: using patch to quit  Substance and Sexual Activity  . Alcohol use: Yes    Alcohol/week: 17.0 standard drinks    Types: 17 Shots of liquor per week    Comment: 02/03/2013 "about 1/5th of grey goose/wk"  . Drug use: Yes    Types: Cocaine    Comment: prior to stroke/hospitalization  . Sexual activity: Yes    Partners: Female  Other Topics Concern  . Not on file  Social History Narrative   Right handed    Lives with wife   Social Determinants of Health   Financial Resource Strain:   . Difficulty of Paying Living Expenses:   Food Insecurity:   . Worried About Charity fundraiser in the Last Year:   . Arboriculturist in the Last Year:   Transportation Needs:   . Film/video editor (Medical):   Marland Kitchen Lack of Transportation (Non-Medical):   Physical Activity:   . Days of Exercise per Week:   . Minutes of Exercise per Session:   Stress:   . Feeling of Stress :   Social Connections:   . Frequency of Communication with Friends and Family:   . Frequency of Social Gatherings with Friends and Family:   . Attends Religious Services:   . Active Member of Clubs or Organizations:   . Attends Archivist Meetings:   Marland Kitchen Marital Status:   Intimate Partner Violence:   . Fear of Current or Ex-Partner:   . Emotionally Abused:   Marland Kitchen Physically Abused:   . Sexually Abused:     Medications:   Current Outpatient Medications on File Prior to Visit  Medication Sig Dispense Refill  . amLODipine (NORVASC) 10 MG tablet Take 1 tablet (10 mg total) by mouth daily. 30 tablet 0  . aspirin 325 MG tablet Take 1 tablet (325 mg total) by mouth daily.    Marland Kitchen atorvastatin (LIPITOR) 80 MG tablet Take 1 tablet (80 mg total) by mouth daily at 6 PM. 30 tablet 0  . carvedilol (COREG) 6.25 MG tablet Take 1 tablet (6.25 mg total) by mouth 2 (two) times daily with a  meal. 30 tablet 0  . lisinopril (ZESTRIL) 30 MG tablet Take 1 tablet (30 mg total) by mouth daily. 30 tablet 0  . senna-docusate (SENOKOT-S) 8.6-50 MG tablet Take 1 tablet by mouth 2 (two) times daily. 30 tablet 0   No current facility-administered medications on file prior to visit.    Allergies:   Allergies  Allergen Reactions  . Shellfish Allergy Itching    Physical Exam General: Frail cachectic looking middle-aged  male, seated, in no evident distress Head: head normocephalic and atraumatic.  Neck: supple with no carotid or supraclavicular bruits Cardiovascular: regular rate and rhythm, no murmurs Musculoskeletal: no  deformity Skin:  no rash/petichiae Vascular:  Normal pulses all extremities Vitals:   03/01/20 1452  BP: 125/83  Pulse: 66   Neurologic Exam Mental Status: Awake and fully alert. Oriented to place and time. Recent and remote memory poor t. Attention span, concentration and fund of knowledge diminished. Mood and affect appropriate.  Decreased recall 1/3.  Has persistent head tilt to the right. Cranial Nerves: Fundoscopic exam not done. Pupils equal, briskly reactive to light. Extraocular movements full without nystagmus but has right gaze preference. Visual fields show dense left homonymous hemianopsia to confrontation. Hearing mildly diminished bilaterally.. Facial sensation intact.  Mild lower left facial weakness., tongue, palate moves normally and symmetrically.  Motor: Normal bulk and tone. Normal strength in all tested extremity muscles except mild left hemiparesis 4/5 strength with weakness of left grip and intrinsic hand muscles.  Orbits right over left upper extremity.  Mild weakness of her left hip flexors and ankle dorsiflexors.. Sensory.: intact to touch ,pinprick .position and vibratory sensation.  Coordination: Mildly impaired in the left upper and lower extremities and normal in the right. Gait and Station: Sitting in a wheelchair.  Gait not assessed as  patient is a 1 person assist even with a walker. Reflexes: 1+ and symmetric. Toes downgoing.      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  Patient has significant cognitive impairment and dense persistent left homonymous hemianopsia     PLAN: I had a long d/w patient  and his wifeabout his recent stroke, risk for recurrent stroke/TIAs, personally independently reviewed imaging studies and stroke evaluation results and answered questions.Continue aspirin 325 mg daily  for secondary stroke prevention and maintain strict control of hypertension with blood pressure goal below 130/90, diabetes with hemoglobin A1c goal below 6.5% and lipids with LDL cholesterol goal below 70 mg/dL. I also advised the patient to eat a healthy diet with plenty of whole grains, cereals, fruits and vegetables, exercise regularly and maintain ideal body weight.  He was encouraged to walk with a walker with 1 person assist and we discussed fall prevention precautions.  Followup in the future with my nurse practitioner Janett Billow in 6 months or call earlier if necessary. Greater than 50% of time during this 30 minute visit was spent on counseling,explanation of diagnosis of stroke, cognitive impairment planning of further management, discussion with patient and family and coordination of care Antony Contras, MD  [facility] [address] [place], [place] [address]  Phone [phone] Fax [phone] Note: This document was prepared with digital dictation and possible smart phrase technology. Any transcriptional errors that result from this process are unintentional

## 2020-06-12 IMAGING — CT CT ANGIO NECK
2 of 7 series · 8 of 34 positions shown · IV contrast (omnipaque)
Comparison: None.

CLINICAL DATA: Stroke follow-up. Known right PCA stroke.

EXAM:
CT ANGIOGRAPHY HEAD AND NECK
TECHNIQUE: Multidetector CT imaging of the head and neck was performed using
the standard protocol during bolus administration of intravenous
contrast. Multiplanar CT image reconstructions and MIPs were
obtained to evaluate the vascular anatomy. Carotid stenosis
measurements (when applicable) are obtained utilizing NASCET
criteria, using the distal internal carotid diameter as the
denominator.
CONTRAST:  75mL OMNIPAQUE IOHEXOL 350 MG/ML SOLN

[Series 5: cta neck/head · axial · 0.45mm/px · z∈[-164,-46]mm · 2 of 178 slices shown]
[im 60/178  soft-tissue]
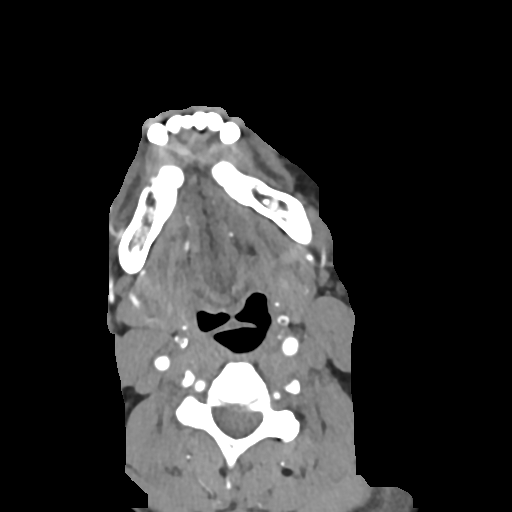
[im 119/178  soft-tissue]
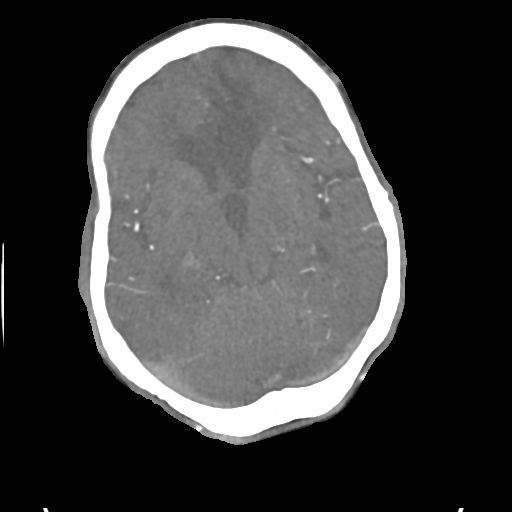

[Series 7: ax thins · axial · 0.39mm/px · z∈[-232,+22]mm · 6 of 356 slices shown]
[im 51/356  soft-tissue]
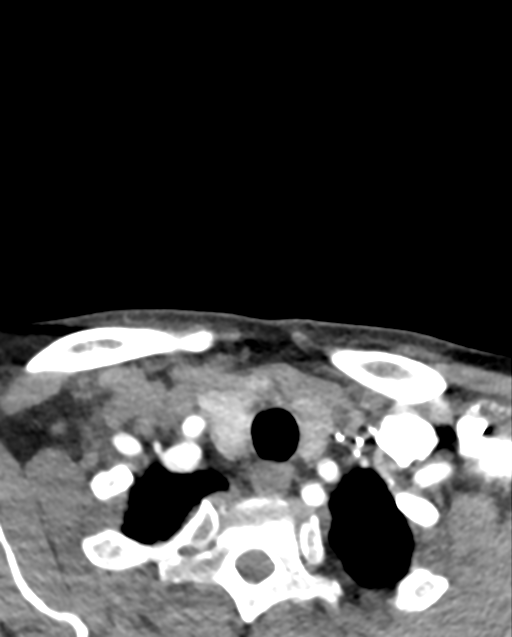
[im 102/356  bone]
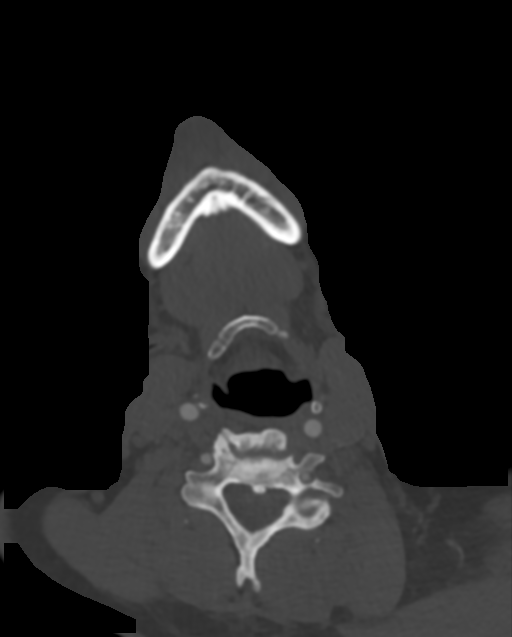
[im 153/356  soft-tissue]
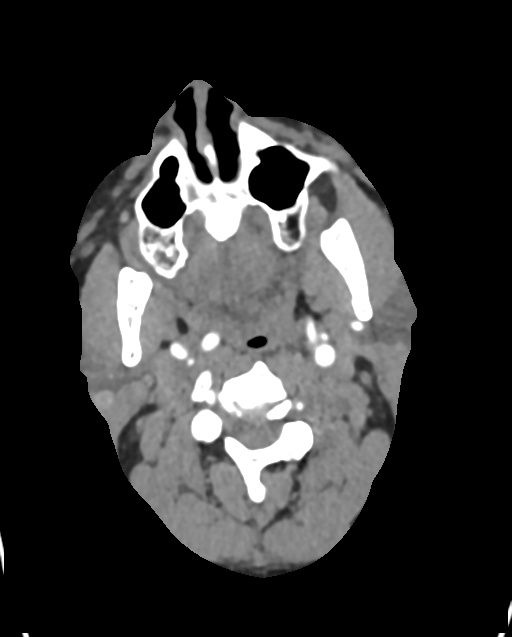
[im 203/356  bone]
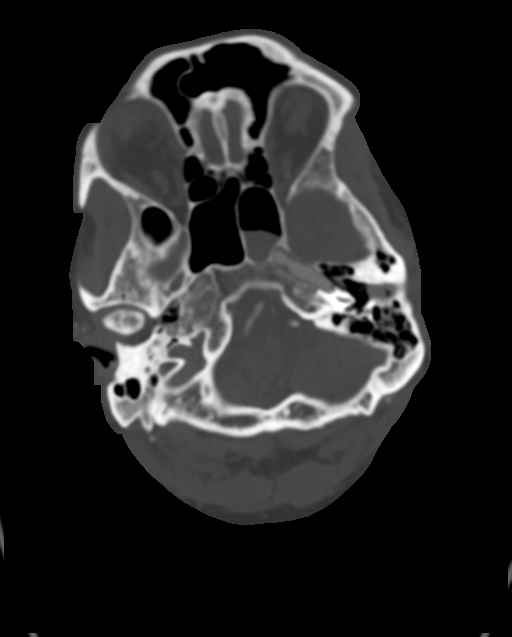
[im 254/356  soft-tissue]
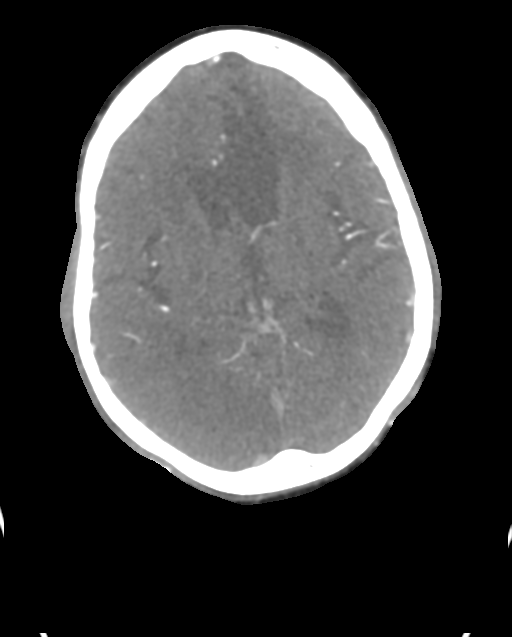
[im 305/356  bone]
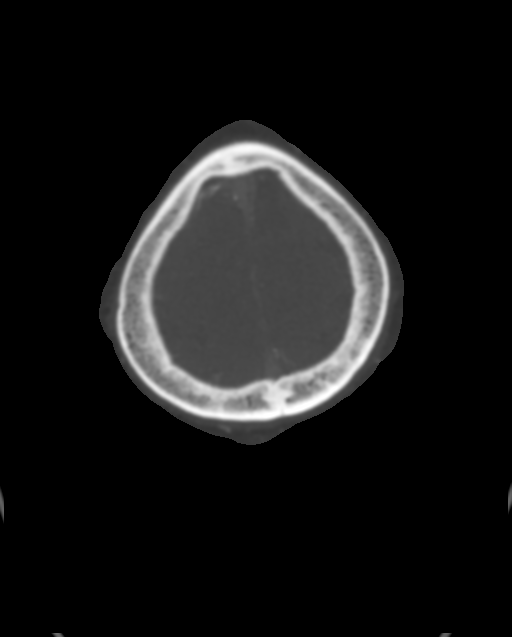

[8 of 34 positions shown; findings below may reference images not displayed]

FINDINGS: CTA NECK FINDINGS

SKELETON: There is no bony spinal canal stenosis. No lytic or
blastic lesion.

OTHER NECK: Hyperdense mass at the inferior aspect of the right
parotid gland measures 1.8 by 1.0 cm.

UPPER CHEST: No pneumothorax or pleural effusion. No nodules or
masses.

AORTIC ARCH:

There is mild calcific atherosclerosis of the aortic arch. There is
no aneurysm, dissection or hemodynamically significant stenosis of
the visualized portion of the aorta. Conventional 3 vessel aortic
branching pattern. The visualized proximal subclavian arteries are
widely patent.

RIGHT CAROTID SYSTEM: Normal without aneurysm, dissection or
stenosis.

LEFT CAROTID SYSTEM: Normal without aneurysm, dissection or
stenosis.

VERTEBRAL ARTERIES: Right dominant configuration. Both origins are
clearly patent. There is no dissection, occlusion or flow-limiting
stenosis to the skull base (V1-V3 segments).

CTA HEAD FINDINGS

POSTERIOR CIRCULATION:

--Vertebral arteries: Normal V4 segments.

--Posterior inferior cerebellar arteries (PICA): Patent origins from
the vertebral arteries.

--Anterior inferior cerebellar arteries (AICA): Patent origins from
the basilar artery.

--Basilar artery: Normal.

--Superior cerebellar arteries: Normal.

--Posterior cerebral arteries: The right PCA is occluded at the P1
P2 junction.

ANTERIOR CIRCULATION:

--Intracranial internal carotid arteries: Atherosclerotic
calcification of the internal carotid arteries at the skull base
without hemodynamically significant stenosis.

--Anterior cerebral arteries (ACA): Normal. Absent right A1 segment,
normal variant

--Middle cerebral arteries (MCA): The M1 segments are normal. There
is multifocal severe stenosis of the M2 segments bilaterally.

VENOUS SINUSES: As permitted by contrast timing, patent.

ANATOMIC VARIANTS: None

Review of the MIP images confirms the above findings.
IMPRESSION: 1. Occlusion of the right posterior cerebral artery at the P1 P2
junction.
2. Bilateral multifocal severe M2 segment stenoses.
3. Hyperdense mass at the inferior aspect of the right parotid
gland, measuring 1.8 by 1.0 cm, most commonly a Warthin tumor.
Histologic sampling should be considered due to the degree of
overlap of imaging features between benign and malignant parotid
tumors.
4. Aortic Atherosclerosis ([id number]).

## 2020-08-21 ENCOUNTER — Ambulatory Visit: Payer: No Typology Code available for payment source | Admitting: Adult Health

## 2020-08-24 ENCOUNTER — Encounter: Payer: Self-pay | Admitting: Adult Health

## 2020-08-24 ENCOUNTER — Ambulatory Visit (INDEPENDENT_AMBULATORY_CARE_PROVIDER_SITE_OTHER): Payer: No Typology Code available for payment source | Admitting: Adult Health

## 2020-08-24 VITALS — BP 132/83 | HR 67

## 2020-08-24 DIAGNOSIS — I69398 Other sequelae of cerebral infarction: Secondary | ICD-10-CM

## 2020-08-24 DIAGNOSIS — Z8673 Personal history of transient ischemic attack (TIA), and cerebral infarction without residual deficits: Secondary | ICD-10-CM | POA: Diagnosis not present

## 2020-08-24 DIAGNOSIS — F0150 Vascular dementia without behavioral disturbance: Secondary | ICD-10-CM

## 2020-08-24 DIAGNOSIS — H547 Unspecified visual loss: Secondary | ICD-10-CM

## 2020-08-24 DIAGNOSIS — R269 Unspecified abnormalities of gait and mobility: Secondary | ICD-10-CM

## 2020-08-24 NOTE — Patient Instructions (Signed)
Referral placed for home health physical, occupation, speech and nursing   Continue aspirin 81 mg daily  and lipitor  for secondary stroke prevention  Continue to follow up with PCP regarding cholesterol and blood pressure management  Maintain strict control of hypertension with blood pressure goal below 130/90 and cholesterol with LDL cholesterol (bad cholesterol) goal below 70 mg/dL.       Followup in the future with me in 6 months or call earlier if needed       Thank you for coming to see [place] at [facility]. I hope we have been able to provide you high quality care today.  You may receive a patient satisfaction survey over the next few weeks. We would appreciate your feedback and comments so that we may continue to improve ourselves and the health of our patients.

## 2020-08-24 NOTE — Progress Notes (Signed)
[facility] [address] [place]. [place] [address] [phone]       OFFICE FOLLOW-UP NOTE  [name] Date of Birth:  07-18-1956 Medical Record Number:  [id number]    Chief Complaint  Patient presents with  . Follow-up    Stroke fu,with wife  rm 9, difficulty walking       HPI:   Today, 08/24/2020, [name] returns for 8-month stroke follow-up accompanied by his wife who provides history.  Reports residual cognitive impairment primarily with short-term memory and delayed processing, visual impairment and gait impairment.  Wife reports limited ambulation during the day and will complain of pain after standing for short duration.  Denies new or worsening stroke/TIA symptoms.  Remains on aspirin 325 mg daily and atorvastatin 80 mg daily without side effects.  Blood pressure today 132/83.  Reports lab work completed 3 weeks ago by PCP currently awaiting results.  No further concerns at this time   History provided for reference purposes only Update 03/01/2020 [name]: He returns for follow-up after last visit 4 months ago.  Is accompanied by his wife.  She has noticed slight improvement in his thought process with his memory remains poor.  He has persistent left-sided vision loss which has not improved.  He needs a lot of assistance to walk and walks with a walker with holding onto his wife but does not walk long distances.  He can feed himself but needs help with most other activities.  He is tolerating aspirin well without upset stomach, bruising or bleeding.  His blood pressure is well controlled today it is 125/83.  He remains on Lipitor which is tolerating well without muscle aches and pains.  He has no new complaints today.   Initial visit 10/26/19 Dr. Lerry [name]. [name] is a 64 year old  male seen today for initial office follow-up visit following hospital admission for stroke in December 2020.  He is accompanied by his wife.  History is obtained from them and  review of electronic medical records and I personally reviewed imaging films in PACS.. [name] is a 64 year old male with past medical history of hypertension, psoriasis and remote smoking quit 6 years ago who presented to [facility] on 08/27/2019 for altered mental status but exact time of onset was unclear.  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  Transthoracic echo showed normal ejection fraction.  LDL cholesterol was elevated at 107 and hemoglobin A1c was 6.2.  Urine drug screen was positive for cocaine.  Patient had previously had a stroke in May 2014 with left ACA occlusion at that time urine drug screen was positive for marijuana and MRI showed left ACA occlusion 2D echo showed normal ejection fraction.  TEE was apparently done but result was not obtainable.  Patient was started on dual antiplatelet therapy aspirin and Plavix and transferred to inpatient rehab at skilled nursing facility which she finished is currently living at home for the last 9 days.  Is home physical and occupational and speech therapy have not yet started.  He is able to walk with a walker but still requires 1 person assist at the nursing facility.  Is spending most of his  time in a wheelchair at home at present.  He also has poor peripheral vision but his short-term memory and cognitive abilities have also declined.  He requires help for most activities of daily living.  Is tolerating aspirin and Plavix well without bruising or bleeding.  He has quit cocaine since his stroke.  He has an upcoming appointment to see his primary care physician soon.  [name] is requesting a  handicap parking sticker for him.    ROS:   N/A d/t cognitive impairment  PMH:  Past Medical History:  Diagnosis Date  . Anginal pain ([place])   . Heart murmur   . Hypertension   . Psoriasis    "back and stomach" (02/03/2013)  . Stroke ([place]) 02/01/2013   left frontal CVA and symptoms of right arm and leg weakness and expressive aphasia/notes 02/03/2013 (02/03/2013)    Social History:  Social History   Socioeconomic History  . Marital status: Married    Spouse name: Not on file  . Number of children: 2  . Years of education: 73  . Highest education level: Not on file  Occupational History  . Occupation: N/A  Tobacco Use  . Smoking status: Former Smoker    Packs/day: 0.25    Years: 37.00    Pack years: 9.25    Types: Cigarettes    Quit date: 04/13/2013    Years since quitting: 7.3  . Smokeless tobacco: Never Used  . Tobacco comment: using patch to quit  Substance and Sexual Activity  . Alcohol use: Yes    Alcohol/week: 17.0 standard drinks    Types: 17 Shots of liquor per week    Comment: 02/03/2013 "about 1/5th of grey goose/wk"  . Drug use: Yes    Types: Cocaine    Comment: prior to stroke/hospitalization  . Sexual activity: Yes    Partners: Female  Other Topics Concern  . Not on file  Social History Narrative   Right handed    Lives with wife   Social Determinants of Health   Financial Resource Strain: Not on file  Food Insecurity: Not on file  Transportation Needs: Not on file  Physical Activity: Not on file  Stress: Not on file  Social Connections: Not on file  Intimate Partner Violence: Not on file    Medications:   Current Outpatient Medications on File Prior to Visit  Medication Sig Dispense Refill  . amLODipine (NORVASC) 10 MG tablet Take 1 tablet (10 mg total) by mouth daily. 30 tablet 0  . aspirin 325 MG tablet Take 1 tablet (325 mg total) by mouth daily.    Marland Kitchen atorvastatin (LIPITOR) 80 MG tablet Take 1 tablet (80 mg total) by mouth daily at 6  PM. 30 tablet 0  . carvedilol (COREG) 6.25 MG tablet Take 1 tablet (6.25 mg total) by mouth 2 (two) times daily with a meal. 30 tablet 0  . lisinopril (ZESTRIL) 30 MG tablet Take 1 tablet (30 mg total) by mouth daily. 30 tablet 0  . senna-docusate (SENOKOT-S) 8.6-50 MG tablet Take 1 tablet by mouth 2 (two) times daily. 30 tablet 0   No current facility-administered medications on file prior to visit.    Allergies:   Allergies  Allergen Reactions  . Shellfish Allergy Itching    Physical Exam Today's Vitals   08/24/20 1454  BP: 132/83  Pulse: 67   There is no height or weight on file to calculate BMI.   General: Frail cachectic looking middle-aged  male, seated, in  no evident distress Head: head normocephalic and atraumatic.  Neck: supple with no carotid or supraclavicular bruits Cardiovascular: regular rate and rhythm, no murmurs Musculoskeletal: no deformity Skin:  no rash/petichiae Vascular:  Normal pulses all extremities  Neurologic Exam Mental Status: Awake and fully alert.  Moderate dysarthria.  Oriented to place and time. Recent and remote memory poor t. Attention span, concentration and fund of knowledge diminished.  Follows simple step commands with some difficulty.  Mood and affect appropriate.   Cranial Nerves:  Pupils equal, briskly reactive to light. Extraocular movements full without nystagmus but has right gaze preference. Visual fields show limited blink to threat on left likely dense left homonymous hemianopsia. left-sided neglect.  Hearing mildly diminished bilaterally.. Facial sensation intact.  Mild lower left facial weakness., tongue, palate moves normally and symmetrically.  Motor: Normal bulk and tone.  Normal strength in all tested extremities Sensory.: intact to touch ,pinprick .position and vibratory sensation.  Coordination: Difficulty fully assessing Gait and Station: Deferred as currently in wheelchair. Reflexes: 1+ and symmetric. Toes downgoing.       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  Patient has significant cognitive impairment, moderate dysarthria, left-sided neglect, gait impairment and dense left homonymous hemianopsia     PLAN: -Continue aspirin 325 mg daily and atorvastatin 80 mg daily for secondary stroke prevention -per wife request, referral placed for HH therapies due to gait difficulty and increased risk of falls -Continue to follow with PCP for HTN and HLD management -maintain strict control of hypertension with blood pressure goal below 130/90 and lipids with LDL cholesterol goal below 70 mg/dL.   Follow-up in 6 months or call earlier if needed    CC:  [facility] provider: Dr. Philis Kendall, L.[name], MD   I spent 30 minutes of face-to-face and non-face-to-face time with patient and wife.  This included previsit chart review, lab review, study review, order entry, electronic health record documentation, patient education and discussion regarding prior stroke with residual deficits, potential benefit with home health therapy, importance of managing stroke risk factors and answered all other questions to patient and wife satisfaction   Frann Rider, AGNP-BC  [facility] [address] [place], [place] [address]  Phone [phone] Fax [phone] Note: This document was prepared with digital dictation and possible smart phrase technology. Any transcriptional errors that result from this process are unintentional.

## 2020-08-24 NOTE — Progress Notes (Signed)
I agree with the above plan 

## 2020-09-13 ENCOUNTER — Telehealth: Payer: Self-pay | Admitting: Adult Health

## 2020-09-13 NOTE — Telephone Encounter (Signed)
[name] with [facility] called today needing pt information [phone]

## 2020-09-13 NOTE — Telephone Encounter (Signed)
Called Aarolyn with [facility] who stated they are trying to get patient started on treatment but don;t have current insurance information. Patient is not returning her calls. I advised our information is dated 2014. She asked to speak with our billing dept. I advised will send note to billing to call her. She  verbalized understanding, appreciation.

## 2020-10-03 ENCOUNTER — Telehealth: Payer: Self-pay | Admitting: Adult Health

## 2020-10-03 NOTE — Telephone Encounter (Signed)
Home Health agency has tried to call numerous times [facility] . I tried to call as well No answer no call back. Thanks Hinton Dyer .

## 2021-02-22 ENCOUNTER — Ambulatory Visit: Payer: No Typology Code available for payment source | Admitting: Adult Health

## 2021-05-16 ENCOUNTER — Ambulatory Visit (INDEPENDENT_AMBULATORY_CARE_PROVIDER_SITE_OTHER): Payer: No Typology Code available for payment source | Admitting: Adult Health

## 2021-05-16 ENCOUNTER — Encounter: Payer: Self-pay | Admitting: Adult Health

## 2021-05-16 VITALS — BP 110/73 | HR 55 | Ht 69.0 in | Wt 148.0 lb

## 2021-05-16 DIAGNOSIS — R269 Unspecified abnormalities of gait and mobility: Secondary | ICD-10-CM

## 2021-05-16 DIAGNOSIS — I69398 Other sequelae of cerebral infarction: Secondary | ICD-10-CM

## 2021-05-16 DIAGNOSIS — Z8673 Personal history of transient ischemic attack (TIA), and cerebral infarction without residual deficits: Secondary | ICD-10-CM | POA: Diagnosis not present

## 2021-05-16 NOTE — Progress Notes (Signed)
[facility] [address] [place]. [place] [address] [phone]       OFFICE FOLLOW-UP NOTE  [name] Date of Birth:  June 28, 1956 Medical Record Number:  [id number]    Primary neurologist: Dr. Leonie [name]  Reason for visit: Stroke follow-up  Chief Complaint  Patient presents with   Follow-up    RM 2 with spouse [name] Pt spouse is here to consult for new referral PT        HPI:   Today, 05/16/2021, [name] returns for overdue 62-monthstroke follow-up accompanied by his wife who provides history.  Overall stable.  Residual deficits (see below) stable without worsening. Did not participate in therapies as previously ordered. Appears to have some communication issues with scheduling. Wife is requesting a new referral for [name]. Ambulates short distance with Rollator walker.  Denies any recent falls. Denies new stroke/TIA symptoms.  Compliant on aspirin 325 mg daily and atorvastatin 80 mg daily.  Blood pressure today 110/73.  Lab work routinely completed by PCP which has been satisfactory.  No further concerns at this time.     History provided for reference purposes only Update 08/24/2020 JM: [name] for 687-monthtroke follow-up accompanied by his wife who provides history.  Reports residual cognitive impairment primarily with short-term memory and delayed processing, visual impairment and gait impairment.  Wife reports limited ambulation during the day and will complain of pain after standing for short duration.  Denies new or worsening stroke/TIA symptoms.  Remains on aspirin 325 mg daily and atorvastatin 80 mg daily without side effects.  Blood pressure today 132/83.  Reports lab work completed 3 weeks ago by PCP currently awaiting results.  No further concerns at this time  Update 03/01/2020 Dr. SeLeonie ManHe returns for follow-up after last visit 4 months ago.  Is accompanied by his wife.  She has noticed slight improvement in his thought process with  his memory remains poor.  He has persistent left-sided vision loss which has not improved.  He needs a lot of assistance to walk and walks with a walker with holding onto his wife but does not walk long distances.  He can feed himself but needs help with most other activities.  He is tolerating aspirin well without upset stomach, bruising or bleeding.  His blood pressure is well controlled today it is 125/83.  He remains on Lipitor which is tolerating well without muscle aches and pains.  He has no new complaints today.   Initial visit 10/26/19 [name] a [age]ear old  [name] seen today for initial office follow-up visit following hospital admission for stroke in December 2020.  He is accompanied by his wife.  History is obtained from them and review of electronic medical records and I personally reviewed imaging films in PACS.. [name] with past medical history of hypertension, psoriasis and remote smoking quit 6 years ago who [facility] for altered mental status but exact time of onset was unclear.  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  Transthoracic echo showed normal ejection fraction.  LDL cholesterol was elevated at 107 and hemoglobin A1c was  6.2.  Urine drug screen was positive for cocaine.  Patient had previously had a stroke in May 2014 with left ACA occlusion at that time urine drug screen was positive for marijuana and MRI showed left ACA occlusion 2D echo showed normal ejection fraction.  TEE was apparently done but result was not obtainable.  Patient was started on dual antiplatelet  therapy aspirin and Plavix and transferred to inpatient rehab at skilled nursing facility which she finished is currently living at home for the last 9 days.  Is home physical and occupational and speech therapy have not yet started.  He is able to walk with a walker but still requires 1 person assist at the nursing facility.  Is spending most of his time in a wheelchair at home at present.  He also has poor peripheral vision but his short-term memory and cognitive abilities have also declined.  He requires help for most activities of daily living.  Is tolerating aspirin and Plavix well without bruising or bleeding.  He has quit cocaine since his stroke.  He has an upcoming appointment to see his primary care physician soon.  [name] is requesting a handicap parking sticker for him.    ROS:   N/A d/t cognitive impairment  PMH:  Past Medical History:  Diagnosis Date   Anginal pain ([place])    Heart murmur    Hypertension    Psoriasis    "back and stomach" (02/03/2013)   Stroke ([place]) 02/01/2013   left frontal CVA and symptoms of right arm and leg weakness and expressive aphasia/notes 02/03/2013 (02/03/2013)    Social History:  Social History   Socioeconomic History   Marital status: Married    Spouse name: Not on file   Number of children: 2   Years of education: 12   Highest education level: Not on file  Occupational History   Occupation: N/A  Tobacco Use   Smoking status: Former    Packs/day: 0.25    Years: 37.00    Pack years: 9.25    Types: Cigarettes    Quit date: 04/13/2013    Years since quitting: 8.0   Smokeless tobacco: Never   Tobacco comments:    using patch to quit  Substance and Sexual Activity   Alcohol use: Yes    Alcohol/week: 17.0 standard drinks    Types: 17 Shots of liquor per week    Comment: 02/03/2013 "about 1/5th of grey goose/wk"   Drug use: Yes    Types: Cocaine    Comment: prior to stroke/hospitalization   Sexual activity: Yes    Partners:  Female  Other Topics Concern   Not on file  Social History Narrative   Right handed    Lives with wife   Social Determinants of Health   Financial Resource Strain: Not on file  Food Insecurity: Not on file  Transportation Needs: Not on file  Physical Activity: Not on file  Stress: Not on file  Social Connections: Not on file  Intimate Partner Violence: Not on file    Medications:   Current Outpatient Medications on File Prior to Visit  Medication Sig Dispense Refill   amLODipine (NORVASC) 10 MG tablet Take 1 tablet (10 mg total) by mouth daily. 30 tablet 0   aspirin 325 MG tablet Take 1 tablet (325 mg total) by mouth daily.     atorvastatin (LIPITOR) 80 MG tablet Take 1 tablet (80 mg total) by mouth daily at 6 PM. 30 tablet 0   carvedilol (  COREG) 6.25 MG tablet Take 1 tablet (6.25 mg total) by mouth 2 (two) times daily with a meal. 30 tablet 0   lisinopril (ZESTRIL) 30 MG tablet Take 1 tablet (30 mg total) by mouth daily. 30 tablet 0   senna-docusate (SENOKOT-S) 8.6-50 MG tablet Take 1 tablet by mouth 2 (two) times daily. 30 tablet 0   No current facility-administered medications on file prior to visit.    Allergies:   Allergies  Allergen Reactions   Shellfish Allergy Itching    Physical Exam Today's Vitals   05/16/21 0854  BP: 110/73  Pulse: (!) 55  Weight: 148 lb (67.1 kg)  Height: '5\' 9"'$  (1.753 m)    Body mass index is 21.86 kg/m.   General: Frail cachectic looking middle-aged  [name], seated, in no evident distress Head: head normocephalic and atraumatic.  Neck: supple with no carotid or supraclavicular bruits Cardiovascular: regular rate and rhythm, no murmurs Musculoskeletal: no deformity Skin:  no rash/petichiae Vascular:  Normal pulses all extremities  Neurologic Exam Mental Status: Awake and fully alert. Moderate dysarthria.  Oriented to time (today is his birthday) and self. Recent and remote memory poor. Attention span, concentration and fund of  knowledge diminished with wife providing history.  Follows simple step commands with some difficulty.  Mood and affect appropriate.   Cranial Nerves:  Pupils equal, briskly reactive to light. Extraocular movements full without nystagmus but has right gaze preference. Visual fields show limited blink to threat on left likely dense left homonymous hemianopsia. left-sided neglect.  Hearing mildly diminished bilaterally.. Facial sensation intact.  Mild lower left facial weakness., tongue, palate moves normally and symmetrically.  Motor: Normal bulk and tone.  Normal strength in all tested extremities Sensory.: intact to touch ,pinprick .position and vibratory sensation.  Coordination: Difficulty participating d/t cognitive impairment Gait and Station: Deferred Reflexes: 1+ and symmetric. Toes downgoing.      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  Patient has significant cognitive impairment, moderate dysarthria, left-sided neglect, gait impairment and dense left homonymous hemianopsia    1.  Right PCA stroke -Referral placed to [facility] PT for gait impairment post stroke -Continue aspirin 325 mg daily and atorvastatin 80 mg daily for secondary stroke prevention -Continue to follow with PCP for HTN and HLD management -HTN: BP goal<130/90. Stable today -HLD: LDL goal<70. Continue atorvastatin 80 mg daily per PCP.  Lipid panel monitored by PCP   Stable from stroke standpoint routinely follow-up with PCP for aggressive stroke risk factor management.  May follow-up on an as-needed basis at this time which wife agreed to   CC:  [place] provider: Dr. Philis Kendall, L.[name], MD   I spent 26 minutes of face-to-face and non-face-to-face time with patient and wife.  This  included previsit chart review, lab review, study review, order entry, electronic health record documentation, patient and wife education and discussion regarding prior stroke with residual deficits, potential benefit with home health therapy, secondary stroke prevention measures and importance of managing stroke risk factors and answered all other questions to patient and wife satisfaction  Frann Rider, AGNP-BC  [facility] [address] [place], [place] [address]  Phone [phone] Fax [phone] Note: This document was prepared with digital dictation and possible smart phrase technology. Any transcriptional errors that result from this process are unintentional.

## 2021-05-16 NOTE — Patient Instructions (Signed)
Referral placed for home health - should be to [facility] at [phone]  Continue aspirin 325 mg daily  and atorvastatin '80mg'$  daily  for secondary stroke prevention  Continue to follow up with PCP regarding cholesterol and blood pressure management  Maintain strict control of hypertension with blood pressure goal below 130/90 and cholesterol with LDL cholesterol (bad cholesterol) goal below 70 mg/dL.        Thank you for coming to see [place] at [facility]. I hope we have been able to provide you high quality care today.  You may receive a patient satisfaction survey over the next few weeks. We would appreciate your feedback and comments so that we may continue to improve ourselves and the health of our patients.

## 2021-05-18 NOTE — Progress Notes (Signed)
I agree with the above plan 

## 2021-05-24 ENCOUNTER — Telehealth: Payer: Self-pay | Admitting: Adult Health

## 2021-05-24 NOTE — Telephone Encounter (Signed)
Spoke with Thayer Headings with [facility], they are able to accept patient's home health referral and can start care next week. Phone: [phone].

## 2021-05-29 NOTE — Telephone Encounter (Signed)
Thayer Headings now advises that they can not accept patient's insurance at [facility]. I checked with [place] at [facility], she states that she is not aware of any agencies that can accept patient's insurance. She suggests outpatient due to patient's insurance.

## 2021-05-29 NOTE — Telephone Encounter (Signed)
I sent patient's referral to [facility] with [facility]. She will review & let me know if they can accept.

## 2021-05-29 NOTE — Telephone Encounter (Signed)
I called patient's wife [name]) to discuss. I  advised her to call patient's insurance to find out if they have recommendations for home health in network with his plan. She states she would do this, but also states patient had home health a year or 2 ago. She could not recall the agency's name but states she would look for it and call me back. I also informed her that if insurance gives her the name of an agency,  this does not mean that an agency will accept the patient. Oftentimes agencies will not accept patients due to their insurance, unfortunately the agencies set these guidelines based on how well certain insurances pay for services. Informed [name] that we will do our best to pair patient with an agency.

## 2021-05-29 NOTE — Telephone Encounter (Signed)
Can wife call insurance to see what agencies cover them? Just seems odd that absolutely no home health agencies except his insurance

## 2021-05-30 NOTE — Telephone Encounter (Signed)
[name] with [facility] let me know that they denied patient's referral due to his insurance.  [name] called back to let me know that home health was through [place] the last time. She provided me with telephone # [phone]. I called and LVM requesting a CB to discuss.

## 2021-06-12 NOTE — Telephone Encounter (Signed)
Never received a CB from the number patient gave. I was able to send patient's referral to [place] with [place] paired with another referral. They will call patient for [facility].

## 2021-06-19 NOTE — Telephone Encounter (Signed)
Are you ok with this plan and for me to do VO ?

## 2021-06-19 NOTE — Telephone Encounter (Signed)
[facility] ( [place]) called request verbal orders for PT Frequency: 1x for 1 week, 2x for 3 wks.  Contact info: [phone]

## 2021-06-19 NOTE — Telephone Encounter (Signed)
Yes.  Thank you.

## 2021-06-19 NOTE — Telephone Encounter (Signed)
Contacted [facility] request for verbal orders for PT Frequency: 1x for 1 week, 2x for 3 wks VO given.

## 2021-06-26 NOTE — Telephone Encounter (Signed)
FYI

## 2021-06-26 NOTE — Telephone Encounter (Signed)
[name] with [facility] called stating pt's wife refused the speech therapy, she was not able to proceed. [name]'s call back number is [phone].

## 2021-07-04 ENCOUNTER — Telehealth: Payer: Self-pay | Admitting: Adult Health

## 2021-07-04 NOTE — Telephone Encounter (Signed)
[name] from [facility] called needing a VO for visit to be done next week due to not being able to do it this Friday as scheduled because the wife has to work. Please advise.

## 2021-07-04 NOTE — Telephone Encounter (Signed)
LVM for [name], giving VO for visit to be done next week due to not being able to do it this Friday as scheduled because the wife has to work. Advised to call back with questions

## 2022-04-25 DIAGNOSIS — L409 Psoriasis, unspecified: Secondary | ICD-10-CM | POA: Diagnosis not present

## 2022-04-25 DIAGNOSIS — I679 Cerebrovascular disease, unspecified: Secondary | ICD-10-CM | POA: Diagnosis not present

## 2022-04-25 DIAGNOSIS — I10 Essential (primary) hypertension: Secondary | ICD-10-CM | POA: Diagnosis not present

## 2022-05-03 DIAGNOSIS — I10 Essential (primary) hypertension: Secondary | ICD-10-CM | POA: Diagnosis not present

## 2022-06-07 DIAGNOSIS — R945 Abnormal results of liver function studies: Secondary | ICD-10-CM | POA: Diagnosis not present

## 2022-06-11 DIAGNOSIS — H01009 Unspecified blepharitis unspecified eye, unspecified eyelid: Secondary | ICD-10-CM | POA: Diagnosis not present

## 2022-07-05 DIAGNOSIS — Z131 Encounter for screening for diabetes mellitus: Secondary | ICD-10-CM | POA: Diagnosis not present

## 2022-07-05 DIAGNOSIS — S91301A Unspecified open wound, right foot, initial encounter: Secondary | ICD-10-CM | POA: Diagnosis not present

## 2022-07-17 ENCOUNTER — Encounter (HOSPITAL_BASED_OUTPATIENT_CLINIC_OR_DEPARTMENT_OTHER): Payer: Medicare Other | Attending: General Surgery | Admitting: General Surgery

## 2022-07-17 DIAGNOSIS — L97312 Non-pressure chronic ulcer of right ankle with fat layer exposed: Secondary | ICD-10-CM | POA: Insufficient documentation

## 2022-07-17 DIAGNOSIS — G8110 Spastic hemiplegia affecting unspecified side: Secondary | ICD-10-CM | POA: Diagnosis not present

## 2022-07-17 DIAGNOSIS — I10 Essential (primary) hypertension: Secondary | ICD-10-CM | POA: Insufficient documentation

## 2022-07-17 DIAGNOSIS — Z8673 Personal history of transient ischemic attack (TIA), and cerebral infarction without residual deficits: Secondary | ICD-10-CM | POA: Insufficient documentation

## 2022-07-17 NOTE — Progress Notes (Signed)
[name], [name] ([id number]) [id number] Page 1 of 8 Visit Report for 07/17/2022 Allergy List Details Patient Name: Date of Service: [name] [facility] 07/17/2022 1:30 PM Medical Record Number: [id number] Patient Account Number: [id number] Date of Birth/Sex: Treating RN: March 10, 1956 (66 y.o. [name] Primary Care Dianca Owensby: Allison Quarry Other Clinician: Referring Tuana Hoheisel: Treating Kayliee Atienza/Extender: Cherly Anderson, Rinka Weeks in Treatment: 0 Allergies Active Allergies Shellfish Containing Products Allergy Notes Electronic Signature(s) Signed: 07/17/2022 5:00:23 PM By: Adline Peals Entered By: Adline Peals on 07/17/2022 13:43:41 -------------------------------------------------------------------------------- Arrival Information Details Patient Name: Date of Service: [name], [name] 07/17/2022 1:30 PM Medical Record Number: [id number] Patient Account Number: [id number] Date of Birth/Sex: Treating RN: 19-Feb-1956 (66 y.o. [name] Primary Care Elan Mcelvain: Allison Quarry Other Clinician: Referring Marilouise Densmore: Treating Tarin Navarez/Extender: Shea Stakes in Treatment: 0 Visit Information Patient Arrived: Wheel Chair Arrival Time: 13:41 Accompanied By: wife Transfer Assistance: None Patient Identification Verified: Yes Secondary Verification Process Completed: Yes Patient Requires Transmission-Based Precautions: No Electronic Signature(s) Signed: 07/17/2022 5:00:23 PM By: Adline Peals Entered By: Adline Peals on 07/17/2022 13:42:17 -------------------------------------------------------------------------------- Clinic Level of Care Assessment Details Patient Name: Date of Service: [name], [name] [facility] 07/17/2022 1:30 PM Medical Record Number: [id number] Patient Account Number: [id number] Date of Birth/Sex: Treating RN: Feb 07, 1956 (66 y.o. [name] [place], [place] ([id number])  [id number] Page 2 of 8 Primary Care Melana Hingle: Allison Quarry Other Clinician: Referring Trease Bremner: Treating Nyeem Stoke/Extender: Cherly Anderson, Rinka Weeks in Treatment: 0 Clinic Level of Care Assessment Items TOOL 1 Quantity Score X- 1 0 Use when EandM and Procedure is performed on INITIAL visit ASSESSMENTS - Nursing Assessment / Reassessment X- 1 20 General Physical Exam (combine w/ comprehensive assessment (listed just below) when performed on new pt. evals) X- 1 25 Comprehensive Assessment (HX, ROS, Risk Assessments, Wounds Hx, etc.) ASSESSMENTS - Wound and Skin Assessment / Reassessment '[]'$  - 0 Dermatologic / Skin Assessment (not related to wound area) ASSESSMENTS - Ostomy and/or Continence Assessment and Care '[]'$  - 0 Incontinence Assessment and Management '[]'$  - 0 Ostomy Care Assessment and Management (repouching, etc.) PROCESS - Coordination of Care X - Simple Patient / Family Education for ongoing care 1 15 '[]'$  - 0 Complex (extensive) Patient / Family Education for ongoing care X- 1 10 Staff obtains Programmer, systems, Records, T Results / Process Orders est '[]'$  - 0 Staff telephones HHA, Nursing Homes / Clarify orders / etc '[]'$  - 0 Routine Transfer to another Facility (non-emergent condition) '[]'$  - 0 Routine Hospital Admission (non-emergent condition) X- 1 15 New Admissions / Biomedical engineer / Ordering NPWT Apligraf, etc. , '[]'$  - 0 Emergency Hospital Admission (emergent condition) PROCESS - Special Needs '[]'$  - 0 Pediatric / Minor Patient Management '[]'$  - 0 Isolation Patient Management '[]'$  - 0 Hearing / Language / Visual special needs '[]'$  - 0 Assessment of Community assistance (transportation, D/C planning, etc.) '[]'$  - 0 Additional assistance / Altered mentation '[]'$  - 0 Support Surface(s) Assessment (bed, cushion, seat, etc.) INTERVENTIONS - Miscellaneous '[]'$  - 0 External ear exam '[]'$  - 0 Patient Transfer (multiple staff / Civil Service fast streamer  / Similar devices) '[]'$  - 0 Simple Staple / Suture removal (25 or less) '[]'$  - 0 Complex Staple / Suture removal (26 or more) '[]'$  - 0 Hypo/Hyperglycemic Management (do not check if billed separately) '[]'$  - 0 Ankle / Brachial Index (ABI) - do not check if billed separately Has the patient been seen at the hospital within the last three years: Yes Total Score: 85 Level  Of Care: New/Established - Level 3 Electronic Signature(s) Signed: 07/17/2022 5:00:23 PM By: Adline Peals Entered By: Adline Peals on 07/17/2022 14:29:54 [name] ([id number]) [id number] Page 3 of 8 -------------------------------------------------------------------------------- Encounter Discharge Information Details Patient Name: Date of Service: [name] [name] 07/17/2022 1:30 PM Medical Record Number: [id number] Patient Account Number: [id number] Date of Birth/Sex: Treating RN: 1956-04-19 (66 y.o. [name] Primary Care Glorian Mcdonell: Allison Quarry Other Clinician: Referring Trenese Haft: Treating Yanelie Abraha/Extender: Shea Stakes in Treatment: 0 Encounter Discharge Information Items Discharge Condition: Stable Ambulatory Status: Wheelchair Discharge Destination: Home Transportation: Private Auto Accompanied By: wife Schedule Follow-up Appointment: Yes Clinical Summary of Care: Patient Declined Electronic Signature(s) Signed: 07/17/2022 5:00:23 PM By: Adline Peals Entered By: Adline Peals on 07/17/2022 14:30:27 -------------------------------------------------------------------------------- Lower Extremity Assessment Details Patient Name: Date of Service: [name], [name] 07/17/2022 1:30 PM Medical Record Number: [id number] Patient Account Number: [id number] Date of Birth/Sex: Treating RN: April 29, 1956 (66 y.o. [name] Primary Care Stepfon Rawles: Allison Quarry Other Clinician: Referring Sunni Richardson: Treating Jeany Seville/Extender:  Cherly Anderson, Rinka Weeks in Treatment: 0 Edema Assessment Assessed: [Left: No] [Right: No] [Left: Edema] [Right: :] Calf Left: Right: Point of Measurement: From Medial Instep 30.5 cm Ankle Left: Right: Point of Measurement: From Medial Instep 21 cm Vascular Assessment Pulses: Dorsalis Pedis Palpable: [Right:Yes] Electronic Signature(s) Signed: 07/17/2022 5:00:23 PM By: Adline Peals Entered By: Adline Peals on 07/17/2022 13:56:27 Multi Wound Chart Details -------------------------------------------------------------------------------- [name] ([id number]) [id number] Page 4 of 8 Patient Name: Date of Service: [name], [name] 07/17/2022 1:30 PM Medical Record Number: [id number] Patient Account Number: [id number] Date of Birth/Sex: Treating RN: [birth year] (66 y.o. M) Primary Care Nelwyn Hebdon: Allison Quarry Other Clinician: Referring Franck Vinal: Treating Shatonia Hoots/Extender: Cherly Anderson, Rinka Weeks in Treatment: 0 Vital Signs Height(in): Pulse(bpm): 60 Weight(lbs): Blood Pressure(mmHg): 123/82 Body Mass Index(BMI): Temperature(F): 98.1 Respiratory Rate(breaths/min): 18 Wound Assessments Wound Number: 1 N/A N/A Photos: N/A N/A Right, Anterior Ankle N/A N/A Wound Location: Gradually Appeared N/A N/A Wounding Event: T be determined o N/A N/A Primary Etiology: Hypertension N/A N/A Comorbid History: 07/17/2021 N/A N/A Date Acquired: 0 N/A N/A Weeks of Treatment: Open N/A N/A Wound Status: No N/A N/A Wound Recurrence: 1.8x1.2x0.1 N/A N/A Measurements L x W x D (cm) 1.696 N/A N/A A (cm) : rea 0.17 N/A N/A Volume (cm) : Full Thickness Without Exposed N/A N/A Classification: Support Structures Medium N/A N/A Exudate Amount: Serosanguineous N/A N/A Exudate Type: red, brown N/A N/A Exudate Color: Distinct, outline attached N/A N/A Wound Margin: Medium (34-66%) N/A N/A Granulation  Amount: Red N/A N/A Granulation Quality: Medium (34-66%) N/A N/A Necrotic Amount: Fat Layer (Subcutaneous Tissue): Yes N/A N/A Exposed Structures: Fascia: No Tendon: No Muscle: No Joint: No Bone: No None N/A N/A Epithelialization: Scarring: Yes N/A N/A Periwound Skin Texture: Dry/Scaly: Yes N/A N/A Periwound Skin Moisture: No Abnormalities Noted N/A N/A Periwound Skin Color: No Abnormality N/A N/A Temperature: Treatment Notes Electronic Signature(s) Signed: 07/17/2022 2:20:03 PM By: Fredirick Maudlin MD FACS Entered By: Fredirick Maudlin on 07/17/2022 14:20:03 -------------------------------------------------------------------------------- Multi-Disciplinary Care Plan Details Patient Name: Date of Service: [name], [name] 07/17/2022 1:30 PM Medical Record Number: [id number] Patient Account Number: [id number] Date of Birth/Sex: Treating RN: 1955-10-20 (66 y.o. [name] Primary Care Sevastian Witczak: Allison Quarry Other Clinician: Selina [name] ([id number]) [id number] Page 5 of 8 Referring Tina Gruner: Treating Lorrie Gargan/Extender: Cherly Anderson, Rinka Weeks in Treatment: 0 Active Inactive Abuse / Safety / Falls / Self Care Management Nursing Diagnoses: Impaired home maintenance Impaired  physical mobility Goals: Patient will not experience any injury related to falls Date Initiated: 07/17/2022 Target Resolution Date: 09/20/2022 Goal Status: Active Patient/caregiver will verbalize/demonstrate measure taken to improve self care Date Initiated: 07/17/2022 Target Resolution Date: 09/20/2022 Goal Status: Active Interventions: Assess fall risk on admission and as needed Notes: Wound/Skin Impairment Nursing Diagnoses: Impaired tissue integrity Knowledge deficit related to ulceration/compromised skin integrity Goals: Patient/caregiver will verbalize understanding of skin care regimen Date Initiated: 07/17/2022 Target Resolution Date:  09/20/2022 Goal Status: Active Interventions: Assess patient/caregiver ability to obtain necessary supplies Assess patient/caregiver ability to perform ulcer/skin care regimen upon admission and as needed Assess ulceration(s) every visit Treatment Activities: Skin care regimen initiated : 07/17/2022 Topical wound management initiated : 07/17/2022 Notes: Electronic Signature(s) Signed: 07/17/2022 5:00:23 PM By: Adline Peals Entered By: Adline Peals on 07/17/2022 14:29:24 -------------------------------------------------------------------------------- Pain Assessment Details Patient Name: Date of Service: [name], [name] 07/17/2022 1:30 PM Medical Record Number: [id number] Patient Account Number: [id number] Date of Birth/Sex: Treating RN: Jan 23, 1956 (66 y.o. [name] Primary Care Klohe Lovering: Allison Quarry Other Clinician: Referring Kylena Mole: Treating Jadynn Epping/Extender: Cherly Anderson, Rinka Weeks in Treatment: 0 Active Problems Location of Pain Severity and Description of Pain Patient Has Paino No Site Locations Rate the pain. [name], [name] ([id number]) [id number] Page 6 of 8 Rate the pain. Current Pain Level: 0 Pain Management and Medication Current Pain Management: Electronic Signature(s) Signed: 07/17/2022 5:00:23 PM By: Adline Peals Entered By: Adline Peals on 07/17/2022 13:56:45 -------------------------------------------------------------------------------- Patient/Caregiver Education Details Patient Name: Date of Service: [name], [name] 11/1/2023andnbsp1:30 PM Medical Record Number: [id number] Patient Account Number: [id number] Date of Birth/Gender: Treating RN: [birth year] (66 y.o. [name] Primary Care Physician: Allison Quarry Other Clinician: Referring Physician: Treating Physician/Extender: Shea Stakes in Treatment: 0 Education Assessment Education  Provided To: Patient Education Topics Provided Wound Debridement: Methods: Explain/Verbal Responses: Reinforcements needed, State content correctly Electronic Signature(s) Signed: 07/17/2022 5:00:23 PM By: Adline Peals Entered By: Adline Peals on 07/17/2022 14:29:31 -------------------------------------------------------------------------------- Wound Assessment Details Patient Name: Date of Service: [name], [name] 07/17/2022 1:30 PM Medical Record Number: [id number] Patient Account Number: [id number] Date of Birth/Sex: Treating RN: [birth year] (66 y.o. [name] Primary Care Terisa Belardo: Allison Quarry Other Clinician: Referring Alfons Sulkowski: Treating Julee Stoll/Extender: Rella Larve Pratt, Iona Beard ([id number]) [id number] Page 7 of 8 Weeks in Treatment: 0 Wound Status Wound Number: 1 Primary Etiology: T be determined o Wound Location: Right, Anterior Ankle Wound Status: Open Wounding Event: Gradually Appeared Comorbid History: Hypertension Date Acquired: 07/17/2021 Weeks Of Treatment: 0 Clustered Wound: No Photos Wound Measurements Length: (cm) 1.8 Width: (cm) 1.2 Depth: (cm) 0.1 Area: (cm) 1.696 Volume: (cm) 0.17 % Reduction in Area: % Reduction in Volume: Epithelialization: None Tunneling: No Undermining: No Wound Description Classification: Full Thickness Without Exposed Support Structures Wound Margin: Distinct, outline attached Exudate Amount: Medium Exudate Type: Serosanguineous Exudate Color: red, brown Foul Odor After Cleansing: No Slough/Fibrino Yes Wound Bed Granulation Amount: Medium (34-66%) Exposed Structure Granulation Quality: Red Fascia Exposed: No Necrotic Amount: Medium (34-66%) Fat Layer (Subcutaneous Tissue) Exposed: Yes Necrotic Quality: Adherent Slough Tendon Exposed: No Muscle Exposed: No Joint Exposed: No Bone Exposed: No Periwound Skin Texture Texture Color No  Abnormalities Noted: No No Abnormalities Noted: Yes Scarring: Yes Temperature / Pain Temperature: No Abnormality Moisture No Abnormalities Noted: No Dry / Scaly: Yes Treatment Notes Wound #1 (Ankle) Wound Laterality: Right, Anterior Cleanser Soap and Water Discharge Instruction: May shower and wash wound with dial antibacterial soap and water prior  to dressing change. Wound Cleanser Discharge Instruction: Cleanse the wound with wound cleanser prior to applying a clean dressing using gauze sponges, not tissue or cotton balls. Peri-Wound Care Topical Primary Dressing IODOFLEX 0.9% Cadexomer Iodine Pad 4x6 cm Discharge Instruction: Apply to wound bed as instructed [name], [name] ([id number]) [id number] Page 8 of 8 Secondary Dressing ALLEVYN Gentle Border, 4x4 (in/in) Discharge Instruction: Apply over primary dressing as directed. Secured With Compression Wrap Compression Stockings Environmental education officer) Signed: 07/17/2022 5:00:23 PM By: Adline Peals Entered By: Adline Peals on 07/17/2022 14:00:27 -------------------------------------------------------------------------------- Vitals Details Patient Name: Date of Service: [name], [name] 07/17/2022 1:30 PM Medical Record Number: [id number] Patient Account Number: [id number] Date of Birth/Sex: Treating RN: 31-May-1956 (66 y.o. [name] Primary Care Montoya Watkin: Allison Quarry Other Clinician: Referring Osaze Hubbert: Treating Edda Orea/Extender: Cherly Anderson, Rinka Weeks in Treatment: 0 Vital Signs Time Taken: 13:43 Temperature (F): 98.1 Pulse (bpm): 60 Respiratory Rate (breaths/min): 18 Blood Pressure (mmHg): 123/82 Reference Range: 80 - 120 mg / dl Electronic Signature(s) Signed: 07/17/2022 5:00:23 PM By: Adline Peals Entered By: Adline Peals on 07/17/2022 13:43:22

## 2022-07-17 NOTE — Progress Notes (Signed)
[name], [name] ([id number]) [id number] Page 1 of 4 Visit Report for 07/17/2022 Abuse Risk Screen Details Patient Name: Date of Service: [name] [facility] 07/17/2022 1:30 PM Medical Record Number: [id number] Patient Account Number: [id number] Date of Birth/Sex: Treating RN: 1956/07/05 (66 y.o. [name] Primary Care Julion Gatt: Allison Quarry Other Clinician: Referring Renarda Mullinix: Treating Orvill Coulthard/Extender: Cherly Anderson, Rinka Weeks in Treatment: 0 Abuse Risk Screen Items Answer Electronic Signature(s) Signed: 07/17/2022 5:00:23 PM By: Adline Peals Entered By: Adline Peals on 07/17/2022 13:47:00 -------------------------------------------------------------------------------- Activities of Daily Living Details Patient Name: Date of Service: [name] [facility] 07/17/2022 1:30 PM Medical Record Number: [id number] Patient Account Number: [id number] Date of Birth/Sex: Treating RN: April 08, 1956 (66 y.o. [name] Primary Care Keoshia Steinmetz: Allison Quarry Other Clinician: Referring Cardell [name]: Treating Quinterious Walraven/Extender: Cherly Anderson, Rinka Weeks in Treatment: 0 Activities of Daily Living Items Answer Activities of Daily Living (Please select one for each item) Drive Automobile Not Able T Medications ake Need Assistance Use T elephone Not Able Care for Appearance Need Assistance Use T oilet Need Assistance Bath / Shower Need Assistance Dress Self Need Assistance Feed Self Need Assistance Walk Need Assistance Get In / Out Bed Need Assistance Housework Not Able Prepare Meals Not Able Handle Money Not Able Shop for Self Not Able Electronic Signature(s) Signed: 07/17/2022 5:00:23 PM By: Adline Peals Entered By: Adline Peals on 07/17/2022 13:47:34 [name] ([id number]) [phone] [id number].pdf Page 2 of  4 -------------------------------------------------------------------------------- Education Screening Details Patient Name: Date of Service: [name] [facility] 07/17/2022 1:30 PM Medical Record Number: [id number] Patient Account Number: [id number] Date of Birth/Sex: Treating RN: [birth year] (66 y.o. [name] Primary Care Bailyn Spackman: Allison Quarry Other Clinician: Referring Hersel Mcmeen: Treating Sache Sane/Extender: Shea Stakes in Treatment: 0 Primary Learner Assessed: Caregiver Learning Preferences/Education Level/Primary Language Learning Preference: Explanation, Demonstration, Video, Printed Material Preferred Language: English Cognitive Barrier Language Barrier: No Translator Needed: No Memory Deficit: No Emotional Barrier: No Cultural/Religious Beliefs Affecting Medical Care: No Physical Barrier Impaired Vision: No Impaired Hearing: No Decreased Hand dexterity: No Knowledge/Comprehension Knowledge Level: Medium Comprehension Level: Medium Ability to understand written instructions: Medium Ability to understand verbal instructions: Medium Motivation Anxiety Level: Calm Cooperation: Cooperative Education Importance: Acknowledges Need Interest in Health Problems: Asks Questions Perception: Coherent Willingness to Engage in Self-Management Medium Activities: Readiness to Engage in Self-Management Medium Activities: Electronic Signature(s) Signed: 07/17/2022 5:00:23 PM By: Adline Peals Entered By: Adline Peals on 07/17/2022 13:48:11 -------------------------------------------------------------------------------- Fall Risk Assessment Details Patient Name: Date of Service: [name], [name] 07/17/2022 1:30 PM Medical Record Number: [id number] Patient Account Number: [id number] Date of Birth/Sex: Treating RN: Aug 17, 1956 (66 y.o. [name] Primary Care Randle Shatzer: Allison Quarry Other Clinician: Referring  Avayah Raffety: Treating Veronique Warga/Extender: Shea Stakes in Treatment: 0 Fall Risk Assessment Items Have you had 2 or more falls in the last 12 monthso 0 No Have you had any fall that resulted in injury in the last 12 monthso 0 No [name], [name] ([id number]) [id number] Page 3 of 4 FALLS RISK SCREEN History of falling - immediate or within 3 months 0 No Secondary diagnosis (Do you have 2 or more medical diagnoseso) 0 No Ambulatory aid None/bed rest/wheelchair/nurse 0 Yes Crutches/cane/walker 0 No Furniture 0 No Intravenous therapy Access/Saline/Heparin Lock 0 No Gait/Transferring Normal/ bed rest/ wheelchair 0 Yes Weak (short steps with or without shuffle, stooped but able to lift head while walking, may seek 10 Yes support from furniture) Impaired (short steps with shuffle, may have difficulty  arising from chair, head down, impaired 20 Yes balance) Mental Status Oriented to own ability 0 Yes Electronic Signature(s) Signed: 07/17/2022 5:00:23 PM By: Adline Peals Entered By: Adline Peals on 07/17/2022 13:48:28 -------------------------------------------------------------------------------- Foot Assessment Details Patient Name: Date of Service: [name], [name] 07/17/2022 1:30 PM Medical Record Number: [id number] Patient Account Number: [id number] Date of Birth/Sex: Treating RN: 1956/07/10 (66 y.o. [name] Primary Care Tavarius Grewe: Allison Quarry Other Clinician: Referring Jules Baty: Treating Sinai Mahany/Extender: Cherly Anderson, Rinka Weeks in Treatment: 0 Foot Assessment Items Site Locations + = Sensation present, - = Sensation absent, C = Callus, U = Ulcer R = Redness, W = Warmth, M = Maceration, PU = Pre-ulcerative lesion F = Fissure, S = Swelling, D = Dryness Assessment Right: Left: Other Deformity: No No Prior Foot Ulcer: No No Prior Amputation: No No Charcot Joint: No No Ambulatory  Status: Ambulatory With Help Assistance Device: Walker GaitAUBRA, [name] ([id number]) [id number] Page 4 of 4 Electronic Signature(s) Signed: 07/17/2022 5:00:23 PM By: Sabas Sous By: Adline Peals on 07/17/2022 13:49:32 -------------------------------------------------------------------------------- Nutrition Risk Screening Details Patient Name: Date of Service: [name] [facility] 07/17/2022 1:30 PM Medical Record Number: [id number] Patient Account Number: [id number] Date of Birth/Sex: Treating RN: 15-Feb-1956 (66 y.o. [name] Primary Care Mykeria Garman: Allison Quarry Other Clinician: Referring Aljean Horiuchi: Treating Kaceton Vieau/Extender: Cherly Anderson, Rinka Weeks in Treatment: 0 Height (in): Weight (lbs): Body Mass Index (BMI): Nutrition Risk Screening Items Score Screening NUTRITION RISK SCREEN: I have an illness or condition that made me change the kind and/or amount of food I eat 0 No I eat fewer than two meals per day 0 No I eat few fruits and vegetables, or milk products 0 No I have three or more drinks of beer, liquor or wine almost every day 0 No I have tooth or mouth problems that make it hard for me to eat 0 No I don't always have enough money to buy the food I need 0 No I eat alone most of the time 0 No I take three or more different prescribed or over-the-counter drugs a day 1 Yes Without wanting to, I have lost or gained 10 pounds in the last six months 0 No I am not always physically able to shop, cook and/or feed myself 0 No Nutrition Protocols Good Risk Protocol 0 No interventions needed Moderate Risk Protocol High Risk Proctocol Risk Level: Good Risk Score: 1 Electronic Signature(s) Signed: 07/17/2022 5:00:23 PM By: Adline Peals Entered By: Adline Peals on 07/17/2022 13:48:52

## 2022-07-17 NOTE — Progress Notes (Addendum)
[name], SAMPLES ([id number]) [id number] Page 1 of 9 Visit Report for 07/17/2022 Chief Complaint Document Details Patient Name: Date of Service: [name] 07/17/2022 1:30 PM Medical Record Number: [id number] Patient Account Number: [id number] Date of Birth/Sex: Treating RN: 10-21-1955 (66 y.o. M) Primary Care Provider: Allison [name] Other Clinician: Referring Provider: Treating Provider/Extender: [name], [name] in Treatment: 0 Information Obtained from: Patient Chief Complaint Patient seen for complaints of Non-Healing Wound. Electronic Signature(s) Signed: 07/17/2022 2:20:15 PM By: [name] Maudlin MD FACS Entered By: [name] on 07/17/2022 14:20:14 -------------------------------------------------------------------------------- Debridement Details Patient Name: Date of Service: MO [name], [name] 07/17/2022 1:30 PM Medical Record Number: [id number] Patient Account Number: [id number] Date of Birth/Sex: Treating RN: [birth year] (66 y.o. [name] Primary Care Provider: Allison [name] Other Clinician: Referring Provider: Treating Provider/Extender: [name] in Treatment: 0 Debridement Performed for Assessment: Wound #1 Right,Anterior Ankle Performed By: Physician [name] Maudlin, MD Debridement Type: Debridement Level of Consciousness (Pre-procedure): Awake and Alert Pre-procedure Verification/Time Out Yes - 14:12 Taken: Start Time: 14:12 Pain Control: Lidocaine 4% T opical Solution T Area Debrided (L x W): otal 1.8 (cm) x 1.2 (cm) = 2.16 (cm) Tissue and other material debrided: Non-Viable, Slough, Slough Level: Non-Viable Tissue Debridement Description: Selective/Open Wound Instrument: Curette Bleeding: Minimum Hemostasis Achieved: Pressure Response to Treatment: Procedure was tolerated well Level of Consciousness (Post- Awake and Alert procedure): Post Debridement  Measurements of Total Wound Length: (cm) 1.8 Width: (cm) 1.2 Depth: (cm) 0.1 Volume: (cm) 0.17 Character of Wound/Ulcer Post Debridement: Improved Post Procedure Diagnosis Same as ANDER, WAMSER ([id number]) [id number] Page 2 of 9 Notes scribed for Dr. Celine Ahr by Adline Peals, RN Electronic Signature(s) Signed: 07/17/2022 3:43:37 PM By: [name] Maudlin MD FACS Signed: 07/17/2022 5:00:23 PM By: Sabas Sous By: Adline Peals on 07/17/2022 14:38:26 -------------------------------------------------------------------------------- HPI Details Patient Name: Date of Service: MO [name], [name] 07/17/2022 1:30 PM Medical Record Number: [id number] Patient Account Number: [id number] Date of Birth/Sex: Treating RN: 03-Jan-1956 (66 y.o. M) Primary Care Provider: Allison [name] Other Clinician: Referring Provider: Treating Provider/Extender: [name], [name] in Treatment: 0 History of Present Illness HPI Description: ADMISSION 07/17/2022 This is [name] 66 year old man with [name] past medical history significant for hypertension and embolic stroke with residual spastic hemiplegia. He apparently has had sores on both anterior ankles for about [name] year. His wife has been managing them at home and says that they wax and wane to some extent and will start to get healed and then break open again. He is not diabetic and he does not smoke. More recently, the left ankle wound healed, but the right ankle wound was looking worse and she sought treatment with the patient's primary care provider. He was prescribed [name] 10-day course of doxycycline which he just completed and was referred to the wound care center for further evaluation and treatment. On the right anterior ankle, just at the bend, there is an irregular ulcer with evidence of prior healing/scarring. The fat layer is exposed and there is quite [name] bit of slough accumulation. He  has [name] scar on the left anterior ankle in an identical location. I wonder if he is somehow hooking his legs under something, such as [name] portion of his wheelchair and causing these lesions. Electronic Signature(s) Signed: 07/17/2022 2:22:40 PM By: [name] Maudlin MD FACS Entered By: [name] on 07/17/2022 14:22:40 -------------------------------------------------------------------------------- Physical Exam Details Patient Name: Date of Service: MO [name], [name] 07/17/2022 1:30 PM Medical Record Number:  [id number] Patient Account Number: [id number] Date of Birth/Sex: Treating RN: [birth year] (66 y.o. M) Primary Care Provider: Allison [name] Other Clinician: Referring Provider: Treating Provider/Extender: [name], [name] in Treatment: 0 Constitutional . . . . No acute distress. Respiratory Normal work of breathing on room air.. Notes 07/17/2022: On the right anterior ankle, just at the bend, there is an irregular ulcer with evidence of prior healing/scarring. The fat layer is exposed and there is quite [name] bit of slough accumulation. He has [name] scar on the left anterior ankle in an identical location. Electronic Signature(s) Signed: 07/17/2022 2:40:14 PM By: [name] Maudlin MD FACS Entered By: [name] on 07/17/2022 14:40:13 [name] ([id number]) [id number] Page 3 of 9 -------------------------------------------------------------------------------- Physician Orders Details Patient Name: Date of Service: MO [name], [name] [facility] 07/17/2022 1:30 PM Medical Record Number: [id number] Patient Account Number: [id number] Date of Birth/Sex: Treating RN: [birth year] (66 y.o. [name] Primary Care Provider: Allison [name] Other Clinician: Referring Provider: Treating Provider/Extender: [name] in Treatment: 0 Verbal / Phone Orders: No Diagnosis Coding ICD-10 Coding Code Description [phone]  Non-pressure chronic ulcer of right ankle with fat layer exposed I10 Essential (primary) hypertension I63.40 Cerebral infarction due to embolism of unspecified cerebral artery G81.10 Spastic hemiplegia affecting unspecified side Follow-up Appointments ppointment in 1 week. - Dr. Celine Ahr - room 2 Return [name] Anesthetic (In clinic) Topical Lidocaine 4% applied to wound bed Bathing/ Shower/ Hygiene May shower and wash wound with soap and water. - with dressing changes Wound Treatment Wound #1 - Ankle Wound Laterality: Right, Anterior Cleanser: Soap and Water 1 x Per Day/30 Days Discharge Instructions: May shower and wash wound with dial antibacterial soap and water prior to dressing change. Cleanser: Wound Cleanser 1 x Per Day/30 Days Discharge Instructions: Cleanse the wound with wound cleanser prior to applying [name] clean dressing using gauze sponges, not tissue or cotton balls. Prim Dressing: IODOFLEX 0.9% Cadexomer Iodine Pad 4x6 cm 1 x Per Day/30 Days ary Discharge Instructions: Apply to wound bed as instructed Secondary Dressing: ALLEVYN Gentle Border, 4x4 (in/in) 1 x Per Day/30 Days Discharge Instructions: Apply over primary dressing as directed. Consults Podiatry - referral for hypertrophic toenails - (ICD10 I63.40 - Cerebral infarction due to embolism of unspecified cerebral artery) Patient Medications llergies: Shellfish Containing Products [name] Notifications Medication Indication Start End 07/17/2022 lidocaine DOSE topical 4 % cream - cream topical Electronic Signature(s) Signed: 07/17/2022 2:40:28 PM By: [name] Maudlin MD FACS Entered By: [name] on 07/17/2022 14:40:28 [name] ([id number]) [id number] Page 4 of 9 Prescription 07/17/2022 -------------------------------------------------------------------------------- [name] Octave MD Patient Name: Provider: 11/29/1955 [id number] Date of Birth: NPI#: Jerilynn Mages [id number] Sex:  DEA #: [phone] Phone #: License #: [place] Patient Address: [address], [place] [address] [place], [place] [address] [place] Provider's Orders Podiatry - ICD10: I63.40 - referral for hypertrophic toenails Hand Signature: Date(s): Electronic Signature(s) Signed: 07/17/2022 2:42:45 PM By: [name] Maudlin MD FACS Entered By: [name] on 07/17/2022 14:42:45 -------------------------------------------------------------------------------- Problem List Details Patient Name: Date of Service: MO [name], [name] 07/17/2022 1:30 PM Medical Record Number: [id number] Patient Account Number: [id number] Date of Birth/Sex: Treating RN: Mar 26, 1956 (66 y.o. M) Primary Care Provider: Allison [name] Other Clinician: Referring Provider: Treating Provider/Extender: [name], [name] in Treatment: 0 Active Problems ICD-10 Encounter Code Description Active Date MDM Diagnosis L97.312 Non-pressure chronic ulcer of right ankle  with fat layer exposed 07/17/2022 No Yes I10 Essential (primary) hypertension 07/17/2022 No Yes I63.40 Cerebral infarction due to embolism of unspecified cerebral artery 07/17/2022 No Yes G81.10 Spastic hemiplegia affecting unspecified side 07/17/2022 No Yes Inactive Problems [name], [name] ([id number]) [id number] Page 5 of 9 Resolved Problems Electronic Signature(s) Signed: 07/17/2022 2:19:12 PM By: [name] Maudlin MD FACS Previous Signature: 07/17/2022 1:37:44 PM Version By: [name] Maudlin MD FACS Entered By: [name] on 07/17/2022 14:19:12 -------------------------------------------------------------------------------- Progress Note Details Patient Name: Date of Service: MO [name], [name] 07/17/2022 1:30 PM Medical Record Number: [id number] Patient Account Number:  [id number] Date of Birth/Sex: Treating RN: 09/18/1955 (66 y.o. M) Primary Care Provider: Allison [name] Other Clinician: Referring Provider: Treating Provider/Extender: [name] in Treatment: 0 Subjective Chief Complaint Information obtained from Patient Patient seen for complaints of Non-Healing Wound. History of Present Illness (HPI) ADMISSION 07/17/2022 This is [name] 66 year old man with [name] past medical history significant for hypertension and embolic stroke with residual spastic hemiplegia. He apparently has had sores on both anterior ankles for about [name] year. His wife has been managing them at home and says that they wax and wane to some extent and will start to get healed and then break open again. He is not diabetic and he does not smoke. More recently, the left ankle wound healed, but the right ankle wound was looking worse and she sought treatment with the patient's primary care provider. He was prescribed [name] 10-day course of doxycycline which he just completed and was referred to the wound care center for further evaluation and treatment. On the right anterior ankle, just at the bend, there is an irregular ulcer with evidence of prior healing/scarring. The fat layer is exposed and there is quite [name] bit of slough accumulation. He has [name] scar on the left anterior ankle in an identical location. I wonder if he is somehow hooking his legs under something, such as [name] portion of his wheelchair and causing these lesions. Patient History Information obtained from Caregiver. Allergies Shellfish Containing Products Family History Unknown History. Social History Former smoker - quit in 2020, Marital Status - Married, Alcohol Use - Never, Drug Use - No History, Caffeine Use - Never. Medical History Cardiovascular Patient has history of Hypertension Endocrine Denies history of Type I Diabetes, Type II Diabetes Medical [name] Surgical History Notes nd Neurologic x2  strokes, cerebrovascular disease Review of Systems (ROS) Constitutional Symptoms (General Health) Denies complaints or symptoms of Fatigue, Fever, Chills, Marked Weight Change. Eyes Denies complaints or symptoms of Dry Eyes, Vision Changes, Glasses / Contacts. Ear/Nose/Mouth/Throat Denies complaints or symptoms of Chronic sinus problems or rhinitis. Respiratory Denies complaints or symptoms of Chronic or frequent coughs, Shortness of Breath. Gastrointestinal Denies complaints or symptoms of Frequent diarrhea, Nausea, Vomiting. Endocrine Denies complaints or symptoms of Heat/cold intolerance. Genitourinary Denies complaints or symptoms of Frequent urination. Integumentary (Skin) Complains or has symptoms of Wounds. [name], [name] ([id number]) [id number] Page 6 of 9 Musculoskeletal Complains or has symptoms of Muscle Weakness. Psychiatric Denies complaints or symptoms of Claustrophobia. Objective Constitutional No acute distress. Vitals Time Taken: 1:43 PM, Temperature: 98.1 F, Pulse: 60 bpm, Respiratory Rate: 18 breaths/min, Blood Pressure: 123/82 mmHg. Respiratory Normal work of breathing on room air.. General Notes: 07/17/2022: On the right anterior ankle, just at the bend, there is an irregular ulcer with evidence of prior healing/scarring. The fat layer is exposed and there is quite [name] bit of slough accumulation. He has [name] scar on the left anterior ankle in  an identical location. Integumentary (Hair, Skin) Wound #1 status is Open. Original cause of wound was Gradually Appeared. The date acquired was: 07/17/2021. The wound is located on the Right,Anterior Ankle. The wound measures 1.8cm length x 1.2cm width x 0.1cm depth; 1.696cm^2 area and 0.17cm^3 volume. There is Fat Layer (Subcutaneous Tissue) exposed. There is no tunneling or undermining noted. There is [name] medium amount of serosanguineous drainage noted. The wound margin is distinct with the outline  attached to the wound base. There is medium (34-66%) red granulation within the wound bed. There is [name] medium (34-66%) amount of necrotic tissue within the wound bed including Adherent Slough. The periwound skin appearance had no abnormalities noted for color. The periwound skin appearance exhibited: Scarring, Dry/Scaly. Periwound temperature was noted as No Abnormality. Assessment Active Problems ICD-10 Non-pressure chronic ulcer of right ankle with fat layer exposed Essential (primary) hypertension Cerebral infarction due to embolism of unspecified cerebral artery Spastic hemiplegia affecting unspecified side Procedures Wound #1 Pre-procedure diagnosis of Wound #1 is [name] T be determined located on the Right,Anterior Ankle . There was [name] Selective/Open Wound Non-Viable Tissue o Debridement with [name] total area of 2.16 sq cm performed by [name] Maudlin, MD. With the following instrument(s): Curette to remove Non-Viable tissue/material. Material removed includes [facility] after achieving pain control using Lidocaine 4% Topical Solution. No specimens were taken. [name] time out was conducted at 14:12, prior to the start of the procedure. [name] Minimum amount of bleeding was controlled with Pressure. The procedure was tolerated well. Post Debridement Measurements: 1.8cm length x 1.2cm width x 0.1cm depth; 0.17cm^3 volume. Character of Wound/Ulcer Post Debridement is improved. Post procedure Diagnosis Wound #1: Same as Pre-Procedure General Notes: scribed for Dr. Celine Ahr by Adline Peals, RN. Plan Follow-up Appointments: Return Appointment in 1 week. - Dr. Celine Ahr - room 2 Anesthetic: (In clinic) Topical Lidocaine 4% applied to wound bed Bathing/ Shower/ Hygiene: May shower and wash wound with soap and water. - with dressing changes Consults ordered were: Podiatry - referral for hypertrophic toenails The following medication(s) was prescribed: lidocaine topical 4 % cream cream topical was prescribed at  facility WOUND #1: - Ankle Wound Laterality: Right, Anterior Cleanser: Soap and Water 1 x Per Day/30 Days Discharge Instructions: May shower and wash wound with dial antibacterial soap and water prior to dressing change. Cleanser: Wound Cleanser 1 x Per Day/30 Days Discharge Instructions: Cleanse the wound with wound cleanser prior to applying [name] clean dressing using gauze sponges, not tissue or cotton balls. [name], [name] ([id number]) [id number] Page 7 of 9 Prim Dressing: IODOFLEX 0.9% Cadexomer Iodine Pad 4x6 cm 1 x Per Day/30 Days ary Discharge Instructions: Apply to wound bed as instructed Secondary Dressing: ALLEVYN Gentle Border, 4x4 (in/in) 1 x Per Day/30 Days Discharge Instructions: Apply over primary dressing as directed. 07/17/2022: This is [name] 66 year old man who has had chronic issues with ulcerations at the anterior junction of his dorsal foot and ankle bilaterally. It appears that he may be hooking his feet underneath the foot pedals of his wheelchair or something similar, causing these lesions. The left sided wound is currently closed. On the right anterior ankle, just at the bend, there is an irregular ulcer with evidence of prior healing/scarring. The fat layer is exposed and there is quite [name] bit of slough accumulation. He has [name] scar on the left anterior ankle in an identical location. I used [name] curette to debride slough from his wound. I think he would benefit from additional chemical debridement so we will  apply Iodoflex to the site and cover with [name] foam border. Follow-up in 1 week. Electronic Signature(s) Signed: 07/17/2022 2:41:55 PM By: [name] Maudlin MD FACS Entered By: [name] on 07/17/2022 14:41:55 -------------------------------------------------------------------------------- HxROS Details Patient Name: Date of Service: MO [name], [name] 07/17/2022 1:30 PM Medical Record Number: [id number] Patient Account Number: [id number] Date of  Birth/Sex: Treating RN: 03-Sep-1956 (66 y.o. [name] Primary Care Provider: Allison [name] Other Clinician: Referring Provider: Treating Provider/Extender: [name], [name] in Treatment: 0 Information Obtained From Caregiver Constitutional Symptoms (General Health) Complaints and Symptoms: Negative for: Fatigue; Fever; Chills; Marked Weight Change Eyes Complaints and Symptoms: Negative for: Dry Eyes; Vision Changes; Glasses / Contacts Ear/Nose/Mouth/Throat Complaints and Symptoms: Negative for: Chronic sinus problems or rhinitis Respiratory Complaints and Symptoms: Negative for: Chronic or frequent coughs; Shortness of Breath Gastrointestinal Complaints and Symptoms: Negative for: Frequent diarrhea; Nausea; Vomiting Endocrine Complaints and Symptoms: Negative for: Heat/cold intolerance Medical History: Negative for: Type I Diabetes; Type II Diabetes Genitourinary Complaints and Symptoms: Negative for: Frequent urination [name], [name] ([id number]) [id number] Page 8 of 9 Integumentary (Skin) Complaints and Symptoms: Positive for: Wounds Musculoskeletal Complaints and Symptoms: Positive for: Muscle Weakness Psychiatric Complaints and Symptoms: Negative for: Claustrophobia Hematologic/Lymphatic Cardiovascular Medical History: Positive for: Hypertension Immunological Neurologic Medical History: Past Medical History Notes: x2 strokes, cerebrovascular disease Oncologic Immunizations Pneumococcal Vaccine: Received Pneumococcal Vaccination: Yes Received Pneumococcal Vaccination On or After 60th Birthday: Yes Implantable Devices None Family and Social History Unknown History: Yes; Former smoker - quit in 2020; Marital Status - Married; Alcohol Use: Never; Drug Use: No History; Caffeine Use: Never; Financial Concerns: No; Food, Clothing or Shelter Needs: No; Support System Lacking: No; Transportation  Concerns: No Electronic Signature(s) Signed: 07/17/2022 3:43:37 PM By: [name] Maudlin MD FACS Signed: 07/17/2022 5:00:23 PM By: Adline Peals Entered By: Adline Peals on 07/17/2022 13:46:55 -------------------------------------------------------------------------------- [place] Details Patient Name: Date of Service: MO [name], [name] [facility] 07/17/2022 Medical Record Number: [id number] Patient Account Number: [id number] Date of Birth/Sex: Treating RN: 08-31-1956 (66 y.o. [name] Primary Care Provider: Allison [name] Other Clinician: Referring Provider: Treating Provider/Extender: [name], [name] in Treatment: 0 Diagnosis Coding ICD-10 Codes Code Description [phone] Non-pressure chronic ulcer of right ankle with fat layer exposed [name], [name] ([id number]) [id number] Page 9 of 9 I10 Essential (primary) hypertension I63.40 Cerebral infarction due to embolism of unspecified cerebral artery G81.10 Spastic hemiplegia affecting unspecified side Facility Procedures : CPT4 Code: [id number] Description: 99213 - WOUND CARE VISIT-LEV 3 EST PT Modifier: 25 Quantity: 1 : CPT4 Code: [id number] Description: 05397 - DEBRIDE WOUND 1ST 20 SQ CM OR < ICD-10 Diagnosis Description Q73.419 Non-pressure chronic ulcer of right ankle with fat layer exposed Modifier: Quantity: 1 Physician Procedures : CPT4 Code Description Modifier [id number] - WC PHYS LEVEL 4 - NEW PT ICD-10 Diagnosis Description G99.242 Non-pressure chronic ulcer of right ankle with fat layer exposed I63.40 Cerebral infarction due to embolism of unspecified cerebral artery  G81.10 Spastic hemiplegia affecting unspecified side I10 Essential (primary) hypertension Quantity: 1 : [id number] - WC PHYS DEBR WO ANESTH 20 SQ CM ICD-10 Diagnosis Description L97.312 Non-pressure chronic ulcer of right ankle with fat layer exposed Quantity: 1 Electronic Signature(s) Signed:  07/17/2022 2:42:32 PM By: [name] Maudlin MD FACS Entered By: [name] on 07/17/2022 14:42:32

## 2022-07-18 DIAGNOSIS — L97312 Non-pressure chronic ulcer of right ankle with fat layer exposed: Secondary | ICD-10-CM | POA: Diagnosis not present

## 2022-07-25 ENCOUNTER — Encounter (HOSPITAL_BASED_OUTPATIENT_CLINIC_OR_DEPARTMENT_OTHER): Payer: Medicare Other | Admitting: General Surgery

## 2022-07-25 DIAGNOSIS — Z8673 Personal history of transient ischemic attack (TIA), and cerebral infarction without residual deficits: Secondary | ICD-10-CM | POA: Diagnosis not present

## 2022-07-25 DIAGNOSIS — G8110 Spastic hemiplegia affecting unspecified side: Secondary | ICD-10-CM | POA: Diagnosis not present

## 2022-07-25 DIAGNOSIS — S90511A Abrasion, right ankle, initial encounter: Secondary | ICD-10-CM | POA: Diagnosis not present

## 2022-07-25 DIAGNOSIS — L97312 Non-pressure chronic ulcer of right ankle with fat layer exposed: Secondary | ICD-10-CM | POA: Diagnosis not present

## 2022-07-25 DIAGNOSIS — I10 Essential (primary) hypertension: Secondary | ICD-10-CM | POA: Diagnosis not present

## 2022-07-29 NOTE — Progress Notes (Signed)
BRACE, WELTE ([id number]) [id number] Page 1 of 7 Visit Report for 07/25/2022 Chief Complaint Document Details Patient Name: Date of Service: [name] [facility] 07/25/2022 3:00 PM Medical Record Number: [id number] Patient Account Number: [id number] Date of Birth/Sex: Treating RN: Aug 14, 1956 (66 y.o. M) Primary Care Provider: Allison Quarry Other Clinician: Referring Provider: Treating Provider/Extender: Raina Mina in Treatment: 1 Information Obtained from: Patient Chief Complaint Patient seen for complaints of Non-Healing Wound. Electronic Signature(s) Signed: 07/25/2022 4:04:59 PM By: Fredirick Maudlin MD FACS Entered By: Fredirick Maudlin on 07/25/2022 16:04:59 -------------------------------------------------------------------------------- Debridement Details Patient Name: Date of Service: MO [name], [name] 07/25/2022 3:00 PM Medical Record Number: [id number] Patient Account Number: [id number] Date of Birth/Sex: Treating RN: 1956/01/16 (66 y.o. Waldron Session Primary Care Provider: Allison Quarry Other Clinician: Referring Provider: Treating Provider/Extender: Raina Mina in Treatment: 1 Debridement Performed for Assessment: Wound #1 Right,Anterior Ankle Performed By: Physician Fredirick Maudlin, MD Debridement Type: Debridement Level of Consciousness (Pre-procedure): Awake and Alert Pre-procedure Verification/Time Out Yes - 15:33 Taken: T Area Debrided (L x W): otal 1.8 (cm) x 1 (cm) = 1.8 (cm) Tissue and other material debrided: Viable, Non-Viable, Slough, Subcutaneous, Slough Level: Skin/Subcutaneous Tissue Debridement Description: Excisional Instrument: Curette Bleeding: Minimum Hemostasis Achieved: Pressure Procedural Pain: 6 Post Procedural Pain: 1 Response to Treatment: Procedure was tolerated well Level of Consciousness (Post- Awake and Alert procedure): Post Debridement  Measurements of Total Wound Length: (cm) 1.8 Width: (cm) 1 Depth: (cm) 0.1 Volume: (cm) 0.141 Character of Wound/Ulcer Post Debridement: Requires Further Debridement Post Procedure Diagnosis Same as SUMEDH, SHINSATO ([id number]) [id number] Page 2 of 7 Notes Scribed for Dr. Celine Ahr by Blanche East, RN Electronic Signature(s) Signed: 07/25/2022 4:37:26 PM By: Fredirick Maudlin MD FACS Signed: 07/29/2022 5:04:49 PM By: Blanche East RN Entered By: Blanche East on 07/25/2022 15:34:31 -------------------------------------------------------------------------------- HPI Details Patient Name: Date of Service: MO [name], [name] 07/25/2022 3:00 PM Medical Record Number: [id number] Patient Account Number: [id number] Date of Birth/Sex: Treating RN: 1956-08-10 (66 y.o. M) Primary Care Provider: Allison Quarry Other Clinician: Referring Provider: Treating Provider/Extender: Raina Mina in Treatment: 1 History of Present Illness HPI Description: ADMISSION 07/17/2022 This is a 66 year old man with a past medical history significant for hypertension and embolic stroke with residual spastic hemiplegia. He apparently has had sores on both anterior ankles for about a year. His wife has been managing them at home and says that they wax and wane to some extent and will start to get healed and then break open again. He is not diabetic and he does not smoke. More recently, the left ankle wound healed, but the right ankle wound was looking worse and she sought treatment with the patient's primary care provider. He was prescribed a 10-day course of doxycycline which he just completed and was referred to the wound care center for further evaluation and treatment. On the right anterior ankle, just at the bend, there is an irregular ulcer with evidence of prior healing/scarring. The fat layer is exposed and there is quite a bit of slough  accumulation. He has a scar on the left anterior ankle in an identical location. I wonder if he is somehow hooking his legs under something, such as a portion of his wheelchair and causing these lesions. 07/25/2022: The wound is a little bit smaller this week. There is still a layer of slough on the surface, but good granulation tissue is present underneath. Electronic Signature(s) Signed: 07/25/2022 4:05:24 PM By: Fredirick Maudlin  MD FACS Entered By: Fredirick Maudlin on 07/25/2022 16:05:23 -------------------------------------------------------------------------------- Physical Exam Details Patient Name: Date of Service: MO [name], [name] 07/25/2022 3:00 PM Medical Record Number: [id number] Patient Account Number: [id number] Date of Birth/Sex: Treating RN: Aug 16, 1956 (66 y.o. M) Primary Care Provider: Allison Quarry Other Clinician: Referring Provider: Treating Provider/Extender: Raina Mina in Treatment: 1 Constitutional . . . . No acute distress.Marland Kitchen Respiratory Normal work of breathing on room air.. Notes 07/25/2022: The wound is a little bit smaller this week. There is still a layer of slough on the surface, but good granulation tissue is present underneath. Electronic Signature(s) Signed: 07/25/2022 4:05:54 PM By: Fredirick Maudlin MD FACS Entered By: Fredirick Maudlin on 07/25/2022 [place], [place] ([id number]) [id number] Page 3 of 7 -------------------------------------------------------------------------------- Physician Orders Details Patient Name: Date of Service: MO [name], GEO [facility] 07/25/2022 3:00 PM Medical Record Number: [id number] Patient Account Number: [id number] Date of Birth/Sex: Treating RN: 10/31/1955 (66 y.o. 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 - Dr. Celine Ahr - room 2 Return A Anesthetic (In clinic) Topical Lidocaine 4% applied to wound bed Bathing/ Shower/ Hygiene May shower and wash wound with soap and water. - with dressing changes Wound Treatment Wound #1 - Ankle Wound Laterality: Right, Anterior Cleanser: Soap and Water 1 x Per Day/30 Days Discharge Instructions: May shower and wash wound with dial antibacterial soap and water prior to dressing change. Cleanser: Wound Cleanser 1 x Per Day/30 Days Discharge Instructions: Cleanse the wound with wound cleanser prior to applying a clean dressing using gauze sponges, not tissue or cotton balls. Prim Dressing: Iodosorb Gel 10 (gm) Tube 1 x Per Day/30 Days ary Discharge Instructions: Apply to wound bed as instructed Secondary Dressing: ALLEVYN Gentle Border, 4x4 (in/in) 1 x Per Day/30 Days Discharge Instructions: Apply over primary dressing as directed. Electronic Signature(s) Signed: 07/25/2022 4:06:08 PM By: Fredirick Maudlin MD FACS Entered By: Fredirick Maudlin on 07/25/2022 16:06:08 -------------------------------------------------------------------------------- Problem List Details Patient Name: Date of Service: MO [name], [name] 07/25/2022 3:00 PM Medical Record Number: [id number] Patient Account Number: [id number] Date of Birth/Sex: Treating RN: 05/14/1956 (66 y.o. M) Primary Care Provider: Allison Quarry Other Clinician: Referring Provider: Treating Provider/Extender: Raina Mina in Treatment: [place] ([id number]) [id number] Page 4 of 7 Active Problems ICD-10 Encounter Code  Description Active Date MDM Diagnosis L97.312 Non-pressure chronic ulcer of right ankle with fat layer exposed 07/17/2022 No Yes I10 Essential (primary) hypertension 07/17/2022 No Yes I63.40 Cerebral infarction due to embolism of unspecified cerebral artery 07/17/2022 No Yes G81.10 Spastic hemiplegia affecting unspecified side 07/17/2022 No Yes Inactive Problems Resolved Problems Electronic Signature(s) Signed: 07/25/2022 4:03:34 PM By: Fredirick Maudlin MD FACS Entered By: Fredirick Maudlin on 07/25/2022 16:03:33 -------------------------------------------------------------------------------- Progress Note Details Patient Name: Date of Service: MO [name], [name] 07/25/2022 3:00 PM Medical Record Number: [id number] Patient Account Number: [id number] Date of Birth/Sex: Treating RN: January 08, 1956 (66 y.o. M) Primary Care Provider: Allison Quarry Other Clinician: Referring Provider: Treating Provider/Extender: Raina Mina in Treatment: 1 Subjective Chief Complaint Information obtained from Patient Patient seen for complaints of Non-Healing Wound. History of Present Illness (HPI)  ADMISSION 07/17/2022 This is a 66 year old man with a past medical history significant for hypertension and embolic stroke with residual spastic hemiplegia. He apparently has had sores on both anterior ankles for about a year. His wife has been managing them at home and says that they wax and wane to some extent and will start to get healed and then break open again. He is not diabetic and he does not smoke. More recently, the left ankle wound healed, but the right ankle wound was looking worse and she sought treatment with the patient's primary care provider. He was prescribed a 10-day course of doxycycline which he just completed and was referred to the wound care center for further evaluation and treatment. On the right anterior ankle, just at the bend, there is an irregular ulcer with evidence  of prior healing/scarring. The fat layer is exposed and there is quite a bit of slough accumulation. He has a scar on the left anterior ankle in an identical location. I wonder if he is somehow hooking his legs under something, such as a portion of his wheelchair and causing these lesions. 07/25/2022: The wound is a little bit smaller this week. There is still a layer of slough on the surface, but good granulation tissue is present underneath. Patient History Information obtained from Caregiver. Family History Unknown History. Social History Former smoker - quit in 2020, Marital Status - Married, Alcohol Use - Never, Drug Use - No History, Caffeine Use - Never. Medical History SHEFFIELD, HAWKER ([id number]) [id number] Page 5 of 7 Cardiovascular Patient has history of Hypertension Endocrine Denies history of Type I Diabetes, Type II Diabetes Medical A Surgical History Notes nd Neurologic x2 strokes, cerebrovascular disease Objective Constitutional No acute distress.. Vitals Time Taken: 3:06 PM, Temperature: 97.6 F, Pulse: 88 bpm, Respiratory Rate: 16 breaths/min, Blood Pressure: 112/70 mmHg. Respiratory Normal work of breathing on room air.. General Notes: 07/25/2022: The wound is a little bit smaller this week. There is still a layer of slough on the surface, but good granulation tissue is present underneath. Integumentary (Hair, Skin) Wound #1 status is Open. Original cause of wound was Gradually Appeared. The date acquired was: 07/17/2021. The wound has been in treatment 1 weeks. The wound is located on the Right,Anterior Ankle. The wound measures 1.8cm length x 1cm width x 0.1cm depth; 1.414cm^2 area and 0.141cm^3 volume. There is Fat Layer (Subcutaneous Tissue) exposed. There is no tunneling or undermining noted. There is a medium amount of serosanguineous drainage noted. The wound margin is distinct with the outline attached to the wound base. There is  medium (34-66%) red, pink granulation within the wound bed. There is a medium (34-66%) amount of necrotic tissue within the wound bed including Adherent Slough. The periwound skin appearance had no abnormalities noted for color. The periwound skin appearance exhibited: Scarring, Dry/Scaly. Periwound temperature was noted as No Abnormality. 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 There was a Excisional Skin/Subcutaneous Tissue Debridement with a total area of 1.8 sq cm performed by Fredirick Maudlin, MD. With the following instrument(s): Curette to remove Viable and Non-Viable tissue/material. Material removed includes Subcutaneous Tissue and Slough and. No specimens were taken. A time out was conducted at 15:33, prior to the start of the procedure. A Minimum amount  of bleeding was controlled with Pressure. The procedure was tolerated well with a pain level of 6 throughout and a pain level of 1 following the procedure. Post Debridement Measurements: 1.8cm length x 1cm width x 0.1cm depth; 0.141cm^3 volume. Character of Wound/Ulcer Post Debridement requires further debridement. Post procedure Diagnosis Wound #1: Same as Pre-Procedure General Notes: Scribed for Dr. Celine Ahr by Blanche East, RN. Plan Follow-up Appointments: Return Appointment in 1 week. - Dr. Celine Ahr - room 2 Anesthetic: (In clinic) Topical Lidocaine 4% applied to wound bed Bathing/ Shower/ Hygiene: May shower and wash wound with soap and water. - with dressing changes WOUND #1: - Ankle Wound Laterality: Right, Anterior Cleanser: Soap and Water 1 x Per Day/30 Days Discharge Instructions: May shower and wash wound with  dial antibacterial soap and water prior to dressing change. MANCIL, PFENNING ([id number]) [id number] Page 6 of 7 Cleanser: Wound Cleanser 1 x Per Day/30 Days Discharge Instructions: Cleanse the wound with wound cleanser prior to applying a clean dressing using gauze sponges, not tissue or cotton balls. Prim Dressing: Iodosorb Gel 10 (gm) Tube 1 x Per Day/30 Days ary Discharge Instructions: Apply to wound bed as instructed Secondary Dressing: ALLEVYN Gentle Border, 4x4 (in/in) 1 x Per Day/30 Days Discharge Instructions: Apply over primary dressing as directed. 07/25/2022: The wound is a little bit smaller this week. There is still a layer of slough on the surface, but good granulation tissue is present underneath. I used a curette to debride slough and nonviable subcutaneous tissue from the wound. Iodoflex is proving to be too expensive for them so we will see if we can get them Iodosorb instead. Continue daily dressing changes for ongoing chemical debridement. Follow-up in 1 week. Electronic Signature(s) Signed: 07/25/2022 4:07:00 PM By: Fredirick Maudlin MD FACS Entered By: Fredirick Maudlin on 07/25/2022 16:06:59 -------------------------------------------------------------------------------- HxROS Details Patient Name: Date of Service: MO [name], [name] 07/25/2022 3:00 PM Medical Record Number: [id number] Patient Account Number: [id number] Date of Birth/Sex: Treating RN: 21-Jan-1956 (66 y.o. M) Primary Care Provider: Allison Quarry Other Clinician: Referring Provider: Treating Provider/Extender: Raina Mina in Treatment: 1 Information Obtained From Caregiver Cardiovascular Medical History: Positive for: Hypertension Endocrine Medical History: Negative for: Type I Diabetes; Type II Diabetes Neurologic Medical History: Past Medical History Notes: x2 strokes, cerebrovascular disease Immunizations Pneumococcal Vaccine: Received  Pneumococcal Vaccination: Yes Received Pneumococcal Vaccination On or After 60th Birthday: Yes Implantable Devices None Family and Social History Unknown History: Yes; Former smoker - quit in 2020; Marital Status - Married; Alcohol Use: Never; Drug Use: No History; Caffeine Use: Never; Financial Concerns: No; Food, Clothing or Shelter Needs: No; Support System Lacking: No; Transportation Concerns: No Electronic Signature(s) Signed: 07/25/2022 4:37:26 PM By: Fredirick Maudlin MD FACS Entered By: Fredirick Maudlin on 07/25/2022 16:05:29 [name] ([id number]) [id number] Page 7 of 7 -------------------------------------------------------------------------------- SuperBill Details Patient Name: Date of Service: [name] [facility] 07/25/2022 Medical Record Number: [id number] Patient Account Number: [id number] Date of Birth/Sex: Treating RN: 04/03/1956 (66 y.o. 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 G81.10 Spastic hemiplegia affecting unspecified side Facility Procedures : CPT4 Code: [id number] Description: 74163 - DEB SUBQ TISSUE 20 SQ CM/< ICD-10 Diagnosis Description L97.312 Non-pressure chronic ulcer of right ankle with fat layer exposed Modifier: Quantity:  1 Physician Procedures : CPT4 Code Description Modifier [id number] - WC PHYS LEVEL 3 - EST PT 25 ICD-10 Diagnosis Description N01.415 Non-pressure chronic ulcer of right ankle with fat layer exposed I10 Essential (primary) hypertension I63.40 Cerebral infarction due to  embolism of unspecified cerebral artery G81.10 Spastic hemiplegia affecting  unspecified side Quantity: 1 : [id number] 08719 - WC PHYS SUBQ TISS 20 SQ CM ICD-10 Diagnosis Description L97.312 Non-pressure chronic ulcer of right ankle with fat layer exposed Quantity: 1 Electronic Signature(s) Signed: 07/25/2022 4:07:15 PM By: Fredirick Maudlin MD FACS Entered By: Fredirick Maudlin on 07/25/2022 16:07:14

## 2022-07-29 NOTE — Progress Notes (Signed)
[name], [name] ([id number]) [id number] Page 1 of 7 Visit Report for 07/25/2022 Arrival Information Details Patient Name: Date of Service: [name] [facility] 07/25/2022 3:00 PM Medical Record Number: [id number] Patient Account Number: [id number] Date of Birth/Sex: Treating RN: 1956/05/17 (66 y.o. Waldron Session Primary Care Lester Crickenberger: Allison Quarry Other Clinician: Referring Kendricks Reap: Treating Jacy Howat/Extender: Raina Mina in Treatment: 1 Visit Information History Since Last Visit Added or deleted any medications: No Patient Arrived: Wheel Chair Any new allergies or adverse reactions: No Arrival Time: 15:05 Pain Present Now: No Accompanied By: wife Transfer Assistance: None Patient Requires Transmission-Based Precautions: No Electronic Signature(s) Signed: 07/29/2022 5:04:49 PM By: Blanche East RN Entered By: Blanche East on 07/25/2022 15:06:41 -------------------------------------------------------------------------------- Encounter Discharge Information Details Patient Name: Date of Service: MO [name], [name] 07/25/2022 3:00 PM Medical Record Number: [id number] Patient Account Number: [id number] Date of Birth/Sex: Treating RN: [birth year] (66 y.o. Waldron Session Primary Care Reeta Kuk: Allison Quarry Other Clinician: Referring Daniell Paradise: Treating Mason Dibiasio/Extender: Raina Mina in Treatment: 1 Encounter Discharge Information Items Post Procedure Vitals Discharge Condition: Stable Temperature (F): 97.6 Ambulatory Status: Wheelchair Pulse (bpm): 88 Discharge Destination: Home Respiratory Rate (breaths/min): 16 Transportation: Private Auto Blood Pressure (mmHg): 112/70 Accompanied By: wife Schedule Follow-up Appointment: Yes Clinical Summary of Care: Electronic Signature(s) Signed: 07/29/2022 5:04:49 PM By: Blanche East RN Entered By: Blanche East on 07/25/2022  16:18:54 -------------------------------------------------------------------------------- Lower Extremity Assessment Details Patient Name: Date of Service: MO [name], [name] 07/25/2022 3:00 PM Medical Record Number: [id number] Patient Account Number: [id number] Date of Birth/Sex: Treating RN: [birth year] (66 y.o. Waldron Session [place] ([id number]) [id number] Page 2 of 7 Primary Care Tico Crotteau: Allison Quarry Other Clinician: Referring Duane Earnshaw: Treating Zaid Tomes/Extender: Raina Mina in Treatment: 1 Edema Assessment Assessed: [Left: No] [Right: No] [Left: Edema] [Right: :] Calf Left: Right: Point of Measurement: From Medial Instep 30 cm Ankle Left: Right: Point of Measurement: From Medial Instep 21 cm Vascular Assessment Pulses: Dorsalis Pedis Palpable: [Right:Yes] Electronic Signature(s) Signed: 07/29/2022 5:04:49 PM By: Blanche East RN Entered By: Blanche East on 07/25/2022 15:08:02 -------------------------------------------------------------------------------- Multi Wound Chart Details Patient Name: Date of Service: MO [name], [name] 07/25/2022 3:00 PM Medical Record Number: [id number] Patient Account Number: [id number] Date of Birth/Sex: Treating RN: [birth year] (66 y.o. M) Primary Care Syd Newsome: Allison Quarry Other Clinician: Referring Sofiah Lyne: Treating Ashleynicole Mcclees/Extender: Raina Mina in Treatment: 1 Vital Signs Height(in): Pulse(bpm): 67 Weight(lbs): Blood Pressure(mmHg): 112/70 Body Mass Index(BMI): Temperature(F): 97.6 Respiratory Rate(breaths/min): 16 [1:Photos:] [N/A:N/A] Right, Anterior Ankle N/A N/A Wound Location: Gradually Appeared N/A N/A Wounding Event: Abrasion N/A N/A Primary Etiology: Hypertension N/A N/A Comorbid History: 07/17/2021 N/A N/A Date Acquired: 1 N/A N/A Weeks of Treatment: Open N/A N/A Wound Status: No N/A N/A Wound  Recurrence: 1.8x1x0.1 N/A N/A Measurements L x W x D (cm) 1.414 N/A N/A A (cm) : rea 0.141 N/A N/A Volume (cm) : [name], [name] ([id number]) [id number] Page 3 of 7 16.60% N/A N/A % Reduction in Area: 17.10% N/A N/A % Reduction in Volume: Full Thickness Without Exposed N/A N/A Classification: Support Structures Medium N/A N/A Exudate A mount: Serosanguineous N/A N/A Exudate Type: red, brown N/A N/A Exudate Color: Distinct, outline attached N/A N/A Wound Margin: Medium (34-66%) N/A N/A Granulation A mount: Red, Pink N/A N/A Granulation Quality: Medium (34-66%) N/A N/A Necrotic A mount: Fat Layer (Subcutaneous Tissue): Yes N/A N/A Exposed Structures: Fascia: No Tendon: No Muscle: No Joint: No Bone: No None N/A N/A Epithelialization: Debridement -  Excisional N/A N/A Debridement: Pre-procedure Verification/Time Out 15:33 N/A N/A Taken: Subcutaneous, Slough N/A N/A Tissue Debrided: Skin/Subcutaneous Tissue N/A N/A Level: 1.8 N/A N/A Debridement A (sq cm): rea Curette N/A N/A Instrument: Minimum N/A N/A Bleeding: Pressure N/A N/A Hemostasis A chieved: 6 N/A N/A Procedural Pain: 1 N/A N/A Post Procedural Pain: Procedure was tolerated well N/A N/A Debridement Treatment Response: 1.8x1x0.1 N/A N/A Post Debridement Measurements L x W x D (cm) 0.141 N/A N/A Post Debridement Volume: (cm) Scarring: Yes N/A N/A Periwound Skin Texture: Dry/Scaly: Yes N/A N/A Periwound Skin Moisture: No Abnormalities Noted N/A N/A Periwound Skin Color: No Abnormality N/A N/A Temperature: Debridement N/A N/A Procedures Performed: Treatment Notes Electronic Signature(s) Signed: 07/25/2022 4:04:46 PM By: Fredirick Maudlin MD FACS Entered By: Fredirick Maudlin on 07/25/2022 16:04:46 -------------------------------------------------------------------------------- Multi-Disciplinary Care Plan Details Patient Name: Date of Service: MO [name], [name]  07/25/2022 3:00 PM Medical Record Number: [id number] Patient Account Number: [id number] Date of Birth/Sex: Treating RN: 1956-07-12 (66 y.o. Waldron Session Primary Care Ruthellen Tippy: Allison Quarry Other Clinician: Referring Marcell Chavarin: Treating Najah Liverman/Extender: Raina Mina in Treatment: 1 Active Inactive Abuse / Safety / Falls / Self Care Management Nursing Diagnoses: Impaired home maintenance Impaired physical mobility Goals: Patient will not experience any injury related to falls Date Initiated: 07/17/2022 Target Resolution Date: 09/20/2022 Goal Status: Active Patient/caregiver will verbalize/demonstrate measure taken to improve self care Date Initiated: 07/17/2022 Target Resolution Date: 09/20/2022 Goal Status: [address] REEF, ACHTERBERG ([id number]) [id number] Page 4 of 7 Interventions: Assess fall risk on admission and as needed Notes: Wound/Skin Impairment Nursing Diagnoses: Impaired tissue integrity Knowledge deficit related to ulceration/compromised skin integrity Goals: Patient/caregiver will verbalize understanding of skin care regimen Date Initiated: 07/17/2022 Target Resolution Date: 09/20/2022 Goal Status: Active Interventions: Assess patient/caregiver ability to obtain necessary supplies Assess patient/caregiver ability to perform ulcer/skin care regimen upon admission and as needed Assess ulceration(s) every visit Treatment Activities: Skin care regimen initiated : 07/17/2022 Topical wound management initiated : 07/17/2022 Notes: Electronic Signature(s) Signed: 07/29/2022 5:04:49 PM By: Blanche East RN Entered By: Blanche East on 07/25/2022 15:46:45 -------------------------------------------------------------------------------- Pain Assessment Details Patient Name: Date of Service: MO [name], [name] 07/25/2022 3:00 PM Medical Record Number: [id number] Patient Account Number: [id number] Date of Birth/Sex: Treating  RN: 09/21/1955 (66 y.o. Waldron Session Primary Care Katsumi Wisler: Allison Quarry Other Clinician: Referring Jowell Bossi: Treating Sundiata Ferrick/Extender: Raina Mina in Treatment: 1 Active Problems Location of Pain Severity and Description of Pain Patient Has Paino No Site Locations Pain Management and Medication Current Pain Management: GLYNN, YEPES ([id number]) [id number] Page 5 of 7 Electronic Signature(s) Signed: 07/29/2022 5:04:49 PM By: Blanche East RN Entered By: Blanche East on 07/25/2022 15:07:02 -------------------------------------------------------------------------------- Patient/Caregiver Education Details Patient Name: Date of Service: MO [name], [name] 11/9/2023andnbsp3:00 PM Medical Record Number: [id number] Patient Account Number: [id number] Date of Birth/Gender: Treating RN: 09/08/1956 (66 y.o. Waldron Session Primary Care Physician: Allison Quarry Other Clinician: Referring Physician: Treating Physician/Extender: Raina Mina in Treatment: 1 Education Assessment Education Provided To: Patient Education Topics Provided Wound/Skin Impairment: Methods: Explain/Verbal Responses: Reinforcements needed, State content correctly Electronic Signature(s) Signed: 07/29/2022 5:04:49 PM By: Blanche East RN Entered By: Blanche East on 07/25/2022 15:46:59 -------------------------------------------------------------------------------- Wound Assessment Details Patient Name: Date of Service: MO [name], [name] 07/25/2022 3:00 PM Medical Record Number: [id number] Patient Account Number: [id number] Date of Birth/Sex: Treating RN: Sep 29, 1955 (66 y.o. Waldron Session Primary Care Lakeem Rozo: Allison Quarry Other Clinician: Referring Anjolie Majer: Treating Marymargaret Kirker/Extender: Raina Mina  in Treatment: 1 Wound Status Wound Number: 1 Primary Etiology: Abrasion Wound Location:  Right, Anterior Ankle Wound Status: Open Wounding Event: Gradually Appeared Comorbid History: Hypertension Date Acquired: 07/17/2021 Weeks Of Treatment: 1 Clustered Wound: No Photos CLIFFORD, BENNINGER ([id number]) [id number] Page 6 of 7 Wound Measurements Length: (cm) 1.8 Width: (cm) 1 Depth: (cm) 0.1 Area: (cm) 1.414 Volume: (cm) 0.141 % Reduction in Area: 16.6% % Reduction in Volume: 17.1% Epithelialization: None Tunneling: No Undermining: No Wound Description Classification: Full Thickness Without Exposed Support Structures Wound Margin: Distinct, outline attached Exudate Amount: Medium Exudate Type: Serosanguineous Exudate Color: red, brown Foul Odor After Cleansing: No Slough/Fibrino Yes Wound Bed Granulation Amount: Medium (34-66%) Exposed Structure Granulation Quality: Red, Pink Fascia Exposed: No Necrotic Amount: Medium (34-66%) Fat Layer (Subcutaneous Tissue) Exposed: Yes Necrotic Quality: Adherent Slough Tendon Exposed: No Muscle Exposed: No Joint Exposed: No Bone Exposed: No Periwound Skin Texture Texture Color No Abnormalities Noted: No No Abnormalities Noted: Yes Scarring: Yes Temperature / Pain Temperature: No Abnormality Moisture No Abnormalities Noted: No Dry / Scaly: Yes Treatment Notes Wound #1 (Ankle) Wound Laterality: Right, Anterior Cleanser Soap and Water Discharge Instruction: May shower and wash wound with dial antibacterial soap and water prior to dressing change. Wound Cleanser Discharge Instruction: Cleanse the wound with wound cleanser prior to applying a clean dressing using gauze sponges, not tissue or cotton balls. Peri-Wound Care Topical Primary Dressing Iodosorb Gel 10 (gm) Tube Discharge Instruction: Apply to wound bed as instructed Secondary Dressing ALLEVYN Gentle Border, 4x4 (in/in) Discharge Instruction: Apply over primary dressing as directed. Secured With [employer]  [facility]) LOPAKA, KARGE ([id number]) [id number] Page 7 of 7 Signed: 07/29/2022 5:04:49 PM By: Blanche East RN Entered By: Blanche East on 07/25/2022 15:09:58 -------------------------------------------------------------------------------- Vitals Details Patient Name: Date of Service: MO [name], [name] 07/25/2022 3:00 PM Medical Record Number: [id number] Patient Account Number: [id number] Date of Birth/Sex: Treating RN: 1956/06/25 (66 y.o. Waldron Session Primary Care Ariana Cavenaugh: Allison Quarry Other Clinician: Referring Keeton Kassebaum: Treating Keeghan Mcintire/Extender: Raina Mina in Treatment: 1 Vital Signs Time Taken: 15:06 Temperature (F): 97.6 Pulse (bpm): 88 Respiratory Rate (breaths/min): 16 Blood Pressure (mmHg): 112/70 Reference Range: 80 - 120 mg / dl Electronic Signature(s) Signed: 07/29/2022 5:04:49 PM By: Blanche East RN Entered By: Blanche East on 07/25/2022 15:06:57

## 2022-08-02 ENCOUNTER — Encounter (HOSPITAL_BASED_OUTPATIENT_CLINIC_OR_DEPARTMENT_OTHER): Payer: Medicare Other | Admitting: General Surgery

## 2022-08-12 ENCOUNTER — Encounter (HOSPITAL_BASED_OUTPATIENT_CLINIC_OR_DEPARTMENT_OTHER): Payer: Medicare Other | Admitting: General Surgery

## 2022-08-12 DIAGNOSIS — G8110 Spastic hemiplegia affecting unspecified side: Secondary | ICD-10-CM | POA: Diagnosis not present

## 2022-08-12 DIAGNOSIS — I10 Essential (primary) hypertension: Secondary | ICD-10-CM | POA: Diagnosis not present

## 2022-08-12 DIAGNOSIS — L97312 Non-pressure chronic ulcer of right ankle with fat layer exposed: Secondary | ICD-10-CM | POA: Diagnosis not present

## 2022-08-12 DIAGNOSIS — Z8673 Personal history of transient ischemic attack (TIA), and cerebral infarction without residual deficits: Secondary | ICD-10-CM | POA: Diagnosis not present

## 2022-08-12 DIAGNOSIS — S90511A Abrasion, right ankle, initial encounter: Secondary | ICD-10-CM | POA: Diagnosis not present

## 2022-08-12 NOTE — Progress Notes (Signed)
EDRIK, RUNDLE ([id number]) [id number] Page 1 of 7 Visit Report for 08/12/2022 Chief Complaint Document Details Patient Name: Date of Service: [name] [facility] 08/12/2022 2:30 PM Medical Record Number: [id number] Patient Account Number: [id number] Date of Birth/Sex: Treating RN: Dec 11, 1955 (66 y.o. M) Primary Care Provider: Allison Quarry Other Clinician: Referring Provider: Treating Provider/Extender: Raina Mina in Treatment: 3 Information Obtained from: Patient Chief Complaint Patient seen for complaints of Non-Healing Wound. Electronic Signature(s) Signed: 08/12/2022 3:40:35 PM By: Fredirick Maudlin MD FACS Entered By: Fredirick Maudlin on 08/12/2022 15:40:35 -------------------------------------------------------------------------------- Debridement Details Patient Name: Date of Service: [name], [name] 08/12/2022 2:30 PM Medical Record Number: [id number] Patient Account Number: [id number] Date of Birth/Sex: Treating RN: 1956/06/10 (66 y.o. Collene Gobble Primary Care Provider: Allison Quarry Other Clinician: Referring Provider: Treating Provider/Extender: Raina Mina in Treatment: 3 Debridement Performed for Assessment: Wound #1 Right,Anterior Ankle Performed By: Physician Fredirick Maudlin, MD Debridement Type: Debridement Level of Consciousness (Pre-procedure): Awake and Alert Pre-procedure Verification/Time Out Yes - 15:24 Taken: Start Time: 15:24 Pain Control: Lidocaine 5% topical ointment T Area Debrided (L x W): otal 0.5 (cm) x 0.6 (cm) = 0.3 (cm) Tissue and other material debrided: Non-Viable, Slough, Slough Level: Non-Viable Tissue Debridement Description: Selective/Open Wound Instrument: Curette Bleeding: Minimum Hemostasis Achieved: Pressure End Time: 15:25 Procedural Pain: 0 Post Procedural Pain: 0 Response to Treatment: Procedure was tolerated well Level of  Consciousness (Post- Awake and Alert procedure): Post Debridement Measurements of Total Wound Length: (cm) 0.5 Width: (cm) 0.6 Depth: (cm) 0.1 Volume: (cm) 0.024 Character of Wound/Ulcer Post Debridement: Improved Post Procedure Diagnosis [name], [name] ([id number]) [id number] Page 2 of 7 Same as Pre-procedure Notes Scribed for Dr. Celine Ahr by Martina Sinner Electronic Signature(s) Signed: 08/12/2022 4:56:20 PM By: Fredirick Maudlin MD FACS Signed: 08/12/2022 5:02:44 PM By: Dellie Catholic RN Entered By: Dellie Catholic on 08/12/2022 15:28:18 -------------------------------------------------------------------------------- HPI Details Patient Name: Date of Service: [name], [name] 08/12/2022 2:30 PM Medical Record Number: [id number] Patient Account Number: [id number] Date of Birth/Sex: Treating RN: 1956/03/09 (66 y.o. M) Primary Care Provider: Allison Quarry Other Clinician: Referring Provider: Treating Provider/Extender: Raina Mina in Treatment: 3 History of Present Illness HPI Description: ADMISSION 07/17/2022 This is a 66 year old man with a past medical history significant for hypertension and embolic stroke with residual spastic hemiplegia. He apparently has had sores on both anterior ankles for about a year. His wife has been managing them at home and says that they wax and wane to some extent and will start to get healed and then break open again. He is not diabetic and he does not smoke. More recently, the left ankle wound healed, but the right ankle wound was looking worse and she sought treatment with the patient's primary care provider. He was prescribed a 10-day course of doxycycline which he just completed and was referred to the wound care center for further evaluation and treatment. On the right anterior ankle, just at the bend, there is an irregular ulcer with evidence of prior healing/scarring. The fat layer is  exposed and there is quite a bit of slough accumulation. He has a scar on the left anterior ankle in an identical location. I wonder if he is somehow hooking his legs under something, such as a portion of his wheelchair and causing these lesions. 07/25/2022: The wound is a little bit smaller this week. There is still a layer of slough on the surface, but good granulation tissue is present underneath. 08/12/2022:  The wound is half the size as it was at our previous visit. It is clean and flush with the surrounding tissue. Light slough on the surface. Electronic Signature(s) Signed: 08/12/2022 3:41:14 PM By: Fredirick Maudlin MD FACS Entered By: Fredirick Maudlin on 08/12/2022 15:41:14 -------------------------------------------------------------------------------- Physical Exam Details Patient Name: Date of Service: [name], [name] 08/12/2022 2:30 PM Medical Record Number: [id number] Patient Account Number: [id number] Date of Birth/Sex: Treating RN: February 18, 1956 (66 y.o. M) Primary Care Provider: Allison Quarry Other Clinician: Referring Provider: Treating Provider/Extender: Raina Mina in Treatment: 3 Constitutional . . . . No acute distress. Respiratory Normal work of breathing on room air. Notes 08/12/2022: The wound is half the size as it was at our previous visit. It is clean and flush with the surrounding tissue. Light slough on the surface. [name], [name] ([id number]) [id number] Page 3 of 7 Electronic Signature(s) Signed: 08/12/2022 3:41:44 PM By: Fredirick Maudlin MD FACS Entered By: Fredirick Maudlin on 08/12/2022 15:41:44 -------------------------------------------------------------------------------- Physician Orders Details Patient Name: Date of Service: [name], [name] 08/12/2022 2:30 PM Medical Record Number: [id number] Patient Account Number: [id number] Date of Birth/Sex: Treating RN: 1956/08/25 (66 y.o. 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 - Dr. Celine Ahr Room 4 Return A Anesthetic (In clinic) Topical Lidocaine 4% applied to wound bed Bathing/ Shower/ Hygiene May shower and wash wound with soap and water. - with dressing changes Wound Treatment Wound #1 - Ankle Wound Laterality: Right, Anterior Cleanser: Soap and Water 1 x Per Day/30 Days Discharge Instructions: May shower and wash wound with dial antibacterial soap and water prior to dressing change. Cleanser: Wound Cleanser 1 x Per Day/30 Days Discharge Instructions: Cleanse the wound with wound cleanser prior to applying a clean dressing using gauze sponges, not tissue or cotton balls. Prim Dressing: Iodosorb Gel 10 (gm) Tube 1 x Per Day/30 Days ary Discharge Instructions: Apply to wound bed as instructed Secondary Dressing: ALLEVYN Gentle Border, 4x4 (in/in) 1 x Per Day/30 Days Discharge Instructions: Apply over primary dressing as directed. Electronic Signature(s) Signed: 08/12/2022 3:41:55 PM By: Fredirick Maudlin MD FACS Entered By: Fredirick Maudlin on 08/12/2022 15:41:54 -------------------------------------------------------------------------------- Problem List Details Patient Name: Date of Service: [name], [name] 08/12/2022 2:30 PM Medical Record Number: [id number] Patient Account Number: [id number] Date of Birth/Sex: Treating RN: 1956-08-30 (66 y.o. 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 M) Primary Care  Provider: Allison Quarry Other Clinician: Referring Provider: Treating Provider/Extender: Raina Mina in Treatment: 3 Subjective Chief Complaint Information obtained from Patient Patient seen for complaints of Non-Healing Wound. History of Present Illness (HPI) ADMISSION 07/17/2022 This is a 66 year old man with a past medical history significant for hypertension and embolic stroke with residual spastic hemiplegia. He apparently has had sores on both anterior ankles for about a year. His wife has been managing them at home and says that they wax and wane to some extent and will start to get healed and then break open again. He is not diabetic and he does not smoke. More recently, the left ankle wound healed, but the right ankle wound was looking worse and she sought treatment with the patient's primary care provider. He was prescribed a 10-day course of  doxycycline which he just completed and was referred to the wound care center for further evaluation and treatment. On the right anterior ankle, just at the bend, there is an irregular ulcer with evidence of prior healing/scarring. The fat layer is exposed and there is quite a bit of slough accumulation. He has a scar on the left anterior ankle in an identical location. I wonder if he is somehow hooking his legs under something, such as a portion of his wheelchair and causing these lesions. 07/25/2022: The wound is a little bit smaller this week. There is still a layer of slough on the surface, but good granulation tissue is present underneath. 08/12/2022: The wound is half the size as it was at our previous visit. It is clean and flush with the surrounding tissue. Light slough on the surface. Patient History Information obtained from Caregiver. [name], [name] ([id number]) [id number] Page 5 of 7 Family History Unknown History. Social History Former smoker - quit in 2020, Marital Status - Married, Alcohol Use - Never, Drug Use - No History, Caffeine Use - Never. Medical History Cardiovascular Patient has history of Hypertension Endocrine Denies history of Type I Diabetes, Type II Diabetes Medical A Surgical History Notes nd Neurologic x2 strokes, cerebrovascular disease Objective Constitutional No acute distress. Vitals Time Taken: 3:03 PM, Height: 69 in, Weight: 140 lbs, BMI: 20.7, Temperature: 98 F, Pulse: 63 bpm, Respiratory Rate: 16 breaths/min, Blood Pressure: 132/79 mmHg. Respiratory Normal work of breathing on room air. General Notes: 08/12/2022: The wound is half the size as it was at our previous visit. It is clean and flush with the surrounding tissue. Light slough on the surface. Integumentary (Hair, Skin) Wound #1 status is Open. Original cause of wound was Gradually Appeared. The date acquired was: 07/17/2021. The wound has been in treatment 3  weeks. The wound is located on the Right,Anterior Ankle. The wound measures 0.5cm length x 0.6cm width x 0.1cm depth; 0.236cm^2 area and 0.024cm^3 volume. There is Fat Layer (Subcutaneous Tissue) exposed. There is no tunneling or undermining noted. There is a medium amount of serosanguineous drainage noted. The wound margin is distinct with the outline attached to the wound base. There is medium (34-66%) red granulation within the wound bed. There is a medium (34- 66%) amount of necrotic tissue within the wound bed including Adherent Slough. The periwound skin appearance had no abnormalities noted for color. The periwound skin appearance exhibited: Scarring, Dry/Scaly. Periwound temperature was noted as No Abnormality. 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 There was a Selective/Open Wound Non-Viable Tissue Debridement with a total area of 0.3 sq cm performed by Fredirick Maudlin, MD. With the following instrument(s): Curette to remove Non-Viable tissue/material. Material removed includes [facility] after achieving pain control using Lidocaine 5% topical ointment. No specimens were taken. A time out was conducted at 15:24, prior to the start of the procedure. A Minimum amount of bleeding was controlled with Pressure. The procedure was tolerated well with a pain level of 0 throughout and a pain level of 0 following the procedure. Post Debridement Measurements: 0.5cm length x 0.6cm width x 0.1cm depth; 0.024cm^3 volume. Character of Wound/Ulcer Post Debridement is improved. Post procedure Diagnosis Wound #1: Same as Pre-Procedure General Notes: Scribed for Dr. Celine Ahr by Martina Sinner. Plan Follow-up Appointments: [name], [name] ([id number]) [id number] Page 6 of 7 Return Appointment in 2 weeks. - Dr. Celine Ahr Room 4 Anesthetic: (In clinic) Topical Lidocaine 4% applied to wound bed Bathing/ Shower/ Hygiene: May shower and wash wound with soap and water. - with dressing changes WOUND #1: - Ankle Wound Laterality: Right, Anterior Cleanser: Soap and Water 1 x Per Day/30 Days Discharge Instructions: May shower and wash wound with dial antibacterial soap and water prior to dressing change. Cleanser: Wound Cleanser 1 x Per Day/30 Days Discharge Instructions: Cleanse the wound with wound cleanser prior to applying a clean dressing using gauze sponges, not tissue or cotton balls. Prim Dressing: Iodosorb Gel 10 (gm) Tube 1 x Per Day/30 Days ary Discharge Instructions: Apply to wound bed as instructed Secondary Dressing: ALLEVYN Gentle Border, 4x4 (in/in) 1 x Per Day/30 Days Discharge Instructions: Apply over primary dressing as directed. 08/12/2022: The wound is half the size as it was at our previous visit. It is clean and flush with the surrounding tissue. Light slough on the surface. I used a curette to debride the slough off of the wound surface. We will continue Iodosorb and a foam border dressing. They will follow-up in 2 weeks. Electronic Signature(s) Signed: 08/12/2022 3:42:18 PM By: Fredirick Maudlin MD FACS Entered By: Fredirick Maudlin on 08/12/2022 15:42:17 -------------------------------------------------------------------------------- HxROS Details Patient Name: Date of Service: [name], [name] 08/12/2022 2:30 PM Medical Record Number: [id number] Patient Account Number: [id number] Date of Birth/Sex: Treating RN: 1955/12/26 (66 y.o. M) Primary Care Provider: Allison Quarry Other Clinician: Referring Provider: Treating Provider/Extender: Raina Mina in Treatment: 3 Information Obtained  From Caregiver Cardiovascular Medical History: Positive for: Hypertension Endocrine Medical History: Negative for: Type I Diabetes; Type II Diabetes Neurologic Medical History: Past Medical History Notes: x2 strokes, cerebrovascular disease Immunizations Pneumococcal Vaccine: Received Pneumococcal Vaccination: Yes Received Pneumococcal Vaccination On or After 60th Birthday: Yes Implantable Devices None Family and Social History Unknown History: Yes; Former smoker - quit in 2020; Marital Status - Married; Alcohol Use: Never; Drug Use: No History; Caffeine Use: Never; Financial Concerns: No; Food, Clothing or Shelter Needs: No; Support System Lacking: No; Transportation Concerns: No Electronic Signature(s) [name], [name] ([id number]) [id number] Page 7 of 7 Signed: 08/12/2022 4:56:20 PM By: Fredirick Maudlin MD FACS Entered By: Fredirick Maudlin on 08/12/2022 15:41:25 -------------------------------------------------------------------------------- SuperBill Details Patient Name: Date of Service: [name], [name] [facility] 08/12/2022 Medical Record Number: [id number] Patient Account Number: [id number] Date of Birth/Sex: Treating RN: 04-24-1956 (66 y.o. 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 G81.10 Spastic hemiplegia affecting unspecified side Facility Procedures : CPT4 Code: [id number] Description: 27614 - DEBRIDE WOUND 1ST 20 SQ CM OR < ICD-10 Diagnosis Description L97.312 Non-pressure chronic ulcer of right ankle with fat layer exposed Modifier: Quantity: 1 Physician Procedures : CPT4 Code Description  Modifier [id number] - WC PHYS LEVEL 3 - EST PT 25 ICD-10 Diagnosis Description L97.312 Non-pressure chronic ulcer of right ankle with fat layer exposed I10 Essential (primary) hypertension I63.40 Cerebral infarction due to  embolism of unspecified cerebral artery G81.10 Spastic hemiplegia affecting unspecified side Quantity: 1 : [id number] - WC PHYS DEBR WO ANESTH 20 SQ CM ICD-10 Diagnosis Description L97.312 Non-pressure chronic ulcer of right ankle with fat layer exposed Quantity: 1 Electronic Signature(s) Signed: 08/12/2022 3:42:58 PM By: Fredirick Maudlin MD FACS Entered By: Fredirick Maudlin on 08/12/2022 15:42:57

## 2022-08-12 NOTE — Progress Notes (Signed)
JENCARLOS, NICOLSON ([id number]) [id number] Page 1 of 7 Visit Report for 08/12/2022 Arrival Information Details Patient Name: Date of Service: [name] 08/12/2022 2:30 PM Medical Record Number: [id number] Patient Account Number: [id number] Date of Birth/Sex: Treating RN: 1956/01/17 (66 y.o. Collene Gobble Primary Care Isaiah Torok: Allison Quarry Other Clinician: Referring Deep Bonawitz: Treating Pistol Kessenich/Extender: Raina Mina in Treatment: 3 Visit Information History Since Last Visit Added or deleted any medications: No Patient Arrived: Wheel Chair Any new allergies or adverse reactions: No Arrival Time: 15:03 Had a fall or experienced change in No Accompanied By: spouse activities of daily living that may affect Transfer Assistance: None risk of falls: Patient Requires Transmission-Based Precautions: No Signs or symptoms of abuse/neglect since last visito No Hospitalized since last visit: No Implantable device outside of the clinic excluding No cellular tissue based products placed in the center since last visit: Has Dressing in Place as Prescribed: Yes Pain Present Now: No Electronic Signature(s) Signed: 08/12/2022 5:02:44 PM By: Dellie Catholic RN Entered By: Dellie Catholic on 08/12/2022 15:04:00 -------------------------------------------------------------------------------- Encounter Discharge Information Details Patient Name: Date of Service: MO [name], [name] 08/12/2022 2:30 PM Medical Record Number: [id number] Patient Account Number: [id number] Date of Birth/Sex: Treating RN: 12-07-1955 (66 y.o. Collene Gobble Primary Care Giavonni Fonder: Allison Quarry Other Clinician: Referring Ayrton Mcvay: Treating Lavone Weisel/Extender: Raina Mina in Treatment: 3 Encounter Discharge Information Items Post Procedure Vitals Discharge Condition: Stable Temperature (F): 98 Ambulatory Status: Wheelchair Pulse  (bpm): 63 Discharge Destination: Home Respiratory Rate (breaths/min): 16 Transportation: Private Auto Blood Pressure (mmHg): 132/79 Accompanied By: spouse Schedule Follow-up Appointment: Yes Clinical Summary of Care: Patient Declined Electronic Signature(s) Signed: 08/12/2022 5:02:44 PM By: Dellie Catholic RN Entered By: Dellie Catholic on 08/12/2022 17:02:21 [name] ([id number]) [id number] Page 2 of 7 -------------------------------------------------------------------------------- Lower Extremity Assessment Details Patient Name: Date of Service: [name] [facility] 08/12/2022 2:30 PM Medical Record Number: [id number] Patient Account Number: [id number] Date of Birth/Sex: Treating RN: 21-Jul-1956 (66 y.o. Collene Gobble Primary Care Dannielle Baskins: Allison Quarry Other Clinician: Referring Arabel Barcenas: Treating Ayelet Gruenewald/Extender: Raina Mina in Treatment: 3 Edema Assessment Assessed: [Left: No] Patrice Paradise: No] [Left: Edema] [Right: :] Calf Left: Right: Point of Measurement: From Medial Instep 30 cm Ankle Left: Right: Point of Measurement: From Medial Instep 21 cm Vascular Assessment Pulses: Dorsalis Pedis Palpable: [Right:Yes] Electronic Signature(s) Signed: 08/12/2022 5:02:44 PM By: Dellie Catholic RN Entered By: Dellie Catholic on 08/12/2022 15:04:17 -------------------------------------------------------------------------------- Multi Wound Chart Details Patient Name: Date of Service: MO [name], [name] 08/12/2022 2:30 PM Medical Record Number: [id number] Patient Account Number: [id number] Date of Birth/Sex: Treating RN: 04/22/1956 (66 y.o. M) Primary Care Keiston Manley: Allison Quarry Other Clinician: Referring Makhya Arave: Treating Ruairi Stutsman/Extender: Raina Mina in Treatment: 3 Vital Signs Height(in): 69 Pulse(bpm): 63 Weight(lbs): 140 Blood Pressure(mmHg): 132/79 Body Mass Index(BMI):  20.7 Temperature(F): 98 Respiratory Rate(breaths/min): 16 [1:Photos:] [N/A:N/A] Right, Anterior Ankle N/A N/A Wound Location: Gradually Appeared N/A N/A Wounding Event: Abrasion N/A N/A Primary Etiology: Hypertension N/A N/A Comorbid History: 07/17/2021 N/A N/A Date Acquired: 3 N/A N/A Weeks of Treatment: Open N/A N/A Wound Status: No N/A N/A Wound Recurrence: 0.5x0.6x0.1 N/A N/A Measurements L x W x D (cm) 0.236 N/A N/A A (cm) : rea 0.024 N/A N/A Volume (cm) : 86.10% N/A N/A % Reduction in A rea: 85.90% N/A N/A % Reduction in Volume: Full Thickness Without Exposed N/A N/A Classification: Support Structures Medium N/A N/A Exudate A mount: Serosanguineous N/A N/A Exudate Type: red, brown  N/A N/A Exudate Color: Distinct, outline attached N/A N/A Wound Margin: Medium (34-66%) N/A N/A Granulation A mount: Red N/A N/A Granulation Quality: Medium (34-66%) N/A N/A Necrotic A mount: Fat Layer (Subcutaneous Tissue): Yes N/A N/A Exposed Structures: Fascia: No Tendon: No Muscle: No Joint: No Bone: No None N/A N/A Epithelialization: Debridement - Selective/Open Wound N/A N/A Debridement: Pre-procedure Verification/Time Out 15:24 N/A N/A Taken: Lidocaine 5% topical ointment N/A N/A Pain Control: Slough N/A N/A Tissue Debrided: Non-Viable Tissue N/A N/A Level: 0.3 N/A N/A Debridement A (sq cm): rea Curette N/A N/A Instrument: Minimum N/A N/A Bleeding: Pressure N/A N/A Hemostasis A chieved: 0 N/A N/A Procedural Pain: 0 N/A N/A Post Procedural Pain: Procedure was tolerated well N/A N/A Debridement Treatment Response: 0.5x0.6x0.1 N/A N/A Post Debridement Measurements L x W x D (cm) 0.024 N/A N/A Post Debridement Volume: (cm) Scarring: Yes N/A N/A Periwound Skin Texture: Dry/Scaly: Yes N/A N/A Periwound Skin Moisture: No Abnormalities Noted N/A N/A Periwound Skin Color: No Abnormality N/A N/A Temperature: Debridement N/A N/A Procedures  Performed: Treatment Notes Electronic Signature(s) Signed: 08/12/2022 3:40:28 PM By: Fredirick Maudlin MD FACS Entered By: Fredirick Maudlin on 08/12/2022 15:40:28 -------------------------------------------------------------------------------- Multi-Disciplinary Care Plan Details Patient Name: Date of Service: MO [name], [name] 08/12/2022 2:30 PM Medical Record Number: [id number] Patient Account Number: [id number] Date of Birth/Sex: Treating RN: December 12, 1955 (66 y.o. Collene Gobble Primary Care Man Effertz: Allison Quarry Other Clinician: Referring Kalynne Womac: Treating Desira Alessandrini/Extender: Raina Mina in Treatment: 3 Active Inactive Abuse / Safety / Falls / Self Care Management DEKARI, BURES ([id number]) [id number] Page 4 of 7 Nursing Diagnoses: Impaired home maintenance Impaired physical mobility Goals: Patient will not experience any injury related to falls Date Initiated: 07/17/2022 Target Resolution Date: 09/20/2022 Goal Status: Active Patient/caregiver will verbalize/demonstrate measure taken to improve self care Date Initiated: 07/17/2022 Target Resolution Date: 09/20/2022 Goal Status: Active Interventions: Assess fall risk on admission and as needed Notes: Wound/Skin Impairment Nursing Diagnoses: Impaired tissue integrity Knowledge deficit related to ulceration/compromised skin integrity Goals: Patient/caregiver will verbalize understanding of skin care regimen Date Initiated: 07/17/2022 Target Resolution Date: 09/20/2022 Goal Status: Active Interventions: Assess patient/caregiver ability to obtain necessary supplies Assess patient/caregiver ability to perform ulcer/skin care regimen upon admission and as needed Assess ulceration(s) every visit Treatment Activities: Skin care regimen initiated : 07/17/2022 Topical wound management initiated : 07/17/2022 Notes: Electronic Signature(s) Signed: 08/12/2022 5:02:44 PM By:  Dellie Catholic RN Entered By: Dellie Catholic on 08/12/2022 16:56:29 -------------------------------------------------------------------------------- Pain Assessment Details Patient Name: Date of Service: MO [name], [name] 08/12/2022 2:30 PM Medical Record Number: [id number] Patient Account Number: [id number] Date of Birth/Sex: Treating RN: 1956/08/27 (66 y.o. Collene Gobble Primary Care [place] Severs: Allison Quarry Other Clinician: Referring Nijah Tejera: Treating Jasmain Ahlberg/Extender: Raina Mina in Treatment: 3 Active Problems Location of Pain Severity and Description of Pain Patient Has Paino No Site Locations [place] ([id number]) [id number] Page 5 of 7 Pain Management and Medication Current Pain Management: Electronic Signature(s) Signed: 08/12/2022 5:02:44 PM By: Dellie Catholic RN Entered By: Dellie Catholic on 08/12/2022 15:04:11 -------------------------------------------------------------------------------- Patient/Caregiver Education Details Patient Name: Date of Service: MO [name], [name] 11/27/2023andnbsp2:30 PM Medical Record Number: [id number] Patient Account Number: [id number] Date of Birth/Gender: Treating RN: Nov 22, 1955 (66 y.o. Collene Gobble Primary Care Physician: Allison Quarry Other Clinician: Referring Physician: Treating Physician/Extender: Raina Mina in Treatment: 3 Education Assessment Education Provided To: Patient Education Topics Provided Wound/Skin Impairment: Methods: Explain/Verbal Responses: Return demonstration correctly Electronic Signature(s) Signed: 08/12/2022 5:02:44 PM By:  Dellie Catholic RN Entered By: Dellie Catholic on 08/12/2022 16:56:49 -------------------------------------------------------------------------------- Wound Assessment Details Patient Name: Date of Service: [name] [facility] 08/12/2022 2:30 PM Medical Record Number:  [id number] Patient Account Number: [id number] Date of Birth/Sex: Treating RN: 02-06-1956 (66 y.o. Collene Gobble Primary Care Ceola Para: Allison Quarry Other Clinician: Referring Moise Friday: Treating Nashayla Telleria/Extender: Carmel [place], Iona Beard ([id number]) [id number] Page 6 of 7 Weeks in Treatment: 3 Wound Status Wound Number: 1 Primary Etiology: Abrasion Wound Location: Right, Anterior Ankle Wound Status: Open Wounding Event: Gradually Appeared Comorbid History: Hypertension Date Acquired: 07/17/2021 Weeks Of Treatment: 3 Clustered Wound: No Photos Wound Measurements Length: (cm) 0.5 Width: (cm) 0.6 Depth: (cm) 0.1 Area: (cm) 0.236 Volume: (cm) 0.024 % Reduction in Area: 86.1% % Reduction in Volume: 85.9% Epithelialization: None Tunneling: No Undermining: No Wound Description Classification: Full Thickness Without Exposed Support Structures Wound Margin: Distinct, outline attached Exudate Amount: Medium Exudate Type: Serosanguineous Exudate Color: red, brown Foul Odor After Cleansing: No Slough/Fibrino Yes Wound Bed Granulation Amount: Medium (34-66%) Exposed Structure Granulation Quality: Red Fascia Exposed: No Necrotic Amount: Medium (34-66%) Fat Layer (Subcutaneous Tissue) Exposed: Yes Necrotic Quality: Adherent Slough Tendon Exposed: No Muscle Exposed: No Joint Exposed: No Bone Exposed: No Periwound Skin Texture Texture Color No Abnormalities Noted: No No Abnormalities Noted: Yes Scarring: Yes Temperature / Pain Temperature: No Abnormality Moisture No Abnormalities Noted: No Dry / Scaly: Yes Treatment Notes Wound #1 (Ankle) Wound Laterality: Right, Anterior Cleanser Soap and Water Discharge Instruction: May shower and wash wound with dial antibacterial soap and water prior to dressing change. Wound Cleanser Discharge Instruction: Cleanse the wound with wound cleanser prior to applying a clean  dressing using gauze sponges, not tissue or cotton balls. Peri-Wound Care Topical Primary Dressing Iodosorb Gel 10 (gm) Tube Discharge Instruction: Apply to wound bed as instructed DEANGELO, BERNS ([id number]) [phone].pdf Page 7 of 7 Secondary Dressing ALLEVYN Gentle Border, 4x4 (in/in) Discharge Instruction: Apply over primary dressing as directed. Secured With Compression Wrap Compression Stockings Environmental education officer) Signed: 08/12/2022 5:02:44 PM By: Dellie Catholic RN Entered By: Dellie Catholic on 08/12/2022 15:09:21 -------------------------------------------------------------------------------- Vitals Details Patient Name: Date of Service: MO [name], [name] 08/12/2022 2:30 PM Medical Record Number: [id number] Patient Account Number: [id number] Date of Birth/Sex: Treating RN: 07-30-1956 (66 y.o. Collene Gobble Primary Care Filicia Scogin: Allison Quarry Other Clinician: Referring Floreen Teegarden: Treating Jarad Barth/Extender: Raina Mina in Treatment: 3 Vital Signs Time Taken: 15:03 Temperature (F): 98 Height (in): 69 Pulse (bpm): 63 Weight (lbs): 140 Respiratory Rate (breaths/min): 16 Body Mass Index (BMI): 20.7 Blood Pressure (mmHg): 132/79 Reference Range: 80 - 120 mg / dl Electronic Signature(s) Signed: 08/12/2022 5:02:44 PM By: Dellie Catholic RN Entered By: Dellie Catholic on 08/12/2022 15:03:27

## 2022-08-26 ENCOUNTER — Encounter (HOSPITAL_BASED_OUTPATIENT_CLINIC_OR_DEPARTMENT_OTHER): Payer: Medicare Other | Attending: General Surgery | Admitting: Internal Medicine

## 2022-08-26 DIAGNOSIS — S91001A Unspecified open wound, right ankle, initial encounter: Secondary | ICD-10-CM | POA: Diagnosis not present

## 2022-08-27 NOTE — Progress Notes (Signed)
CHOSEN, GESKE ([id number]) [id number] Page 1 of 5 Visit Report for [name] HPI Details Patient Name: Date of Service: [name] [facility] [name] 2:30 PM Medical Record Number: [id number] Patient Account Number: [id number] Date of Birth/Sex: Treating RN: 1956-04-07 (66 y.o. M) Primary Care Provider: Allison [name] Other Clinician: Referring Provider: Treating Provider/Extender: [name] in Treatment: 5 History of Present Illness HPI Description: ADMISSION 07/17/2022 This is a 66 year old man with a past medical history significant for hypertension and embolic stroke with residual spastic hemiplegia. He apparently has had sores on both anterior ankles for about a year. His wife has been managing them at home and says that they wax and wane to some extent and will start to get healed and then break open again. He is not diabetic and he does not smoke. More recently, the left ankle wound healed, but the right ankle wound was looking worse and she sought treatment with the patient's primary care provider. He was prescribed a 10-day course of doxycycline which he just completed and was referred to the wound care center for further evaluation and treatment. On the right anterior ankle, just at the bend, there is an irregular ulcer with evidence of prior healing/scarring. The fat layer is exposed and there is quite a bit of slough accumulation. He has a scar on the left anterior ankle in an identical location. I wonder if he is somehow hooking his legs under something, such as a portion of his wheelchair and causing these lesions. 07/25/2022: The wound is a little bit smaller this week. There is still a layer of slough on the surface, but good granulation tissue is present underneath. 08/12/2022: The wound is half the size as it was at our previous visit. It is clean and flush with the surrounding tissue. Light slough on the  surface. [name]; the patient's wound on the dorsal right ankle is closed. He previously had wounds and mirror-image areas of both ankles and now both of them are closed. In talking to his family it seems that these were abrasion injuries. He pushes himself around in his wheelchair. However I think the exact reason is not clear Electronic Signature(s) Signed: 08/26/2022 5:01:06 PM By: [name] Entered By: [name] 15:26:20 -------------------------------------------------------------------------------- Physical Exam Details Patient Name: Date of Service: MO [name], [name] [name] 2:30 PM Medical Record Number: [id number] Patient Account Number: [id number] Date of Birth/Sex: Treating RN: Sep 29, 1955 (66 y.o. M) Primary Care Provider: Allison [name] Other Clinician: Referring Provider: Treating Provider/Extender: [name] in Treatment: 5 Constitutional Sitting or standing Blood Pressure is within target range for patient.. Pulse regular and within target range for patient.Marland Kitchen Respirations regular, non-labored and within target range.. Temperature is normal and within the target range for the patient.Marland Kitchen Appears in no distress. Notes Peripheral pulses are difficult to feel. Both wounds on the dorsal ankles are fully epithelialized. These were mirror-image wounds probably trauma. Electronic Signature(s) Signed: 08/26/2022 5:01:06 PM By: [name] Entered By: [name] Ki[place], Kohler ([id number]) [id number] Page 2 of 5 -------------------------------------------------------------------------------- Physician Orders Details Patient Name: Date of Service: [name] [facility] [name] 2:30 PM Medical Record Number: [id number] Patient Account Number: [id number] Date of Birth/Sex: Treating RN: 18-Jan-1956 (66 y.o. [name] Primary Care Provider: Allison [name] Other Clinician: Referring Provider: Treating Provider/Extender: [name] in Treatment: 5 Verbal / Phone Orders: No Diagnosis Coding Discharge From [facility] Services Discharge from [place] -  Congratulations!!!! Anesthetic (In clinic) Topical Lidocaine 4% applied to wound bed Consults Podiatry - Hypertrophic nail care Patient Medications llergies: Shellfish Containing Products A Notifications Medication Indication Start End [name] lidocaine DOSE topical 4 % cream - cream topical Electronic Signature(s) Signed: 08/26/2022 4:59:49 PM By: [name] Signed: 08/26/2022 5:01:06 PM By: [name] Entered By: [name] on [name] 15:25:23 Prescription [name] -------------------------------------------------------------------------------- [name] Patient Name: Provider: 05/28/1956 [id number] Date of Birth: NPI#Jerilynn Mages [id number] Sex: DEA #: [phone] [id number] Phone #: License #: [place] Patient Address: [place], [place] [address] [place], [place] [address] [place] Provider's Orders Podiatry - Hypertrophic nail care Hand Signature: Date(s): Electronic Signature(s) [name] ([id number]) [id number] Page 3 of 5 Signed: 08/26/2022 4:59:49 PM By: [name] Signed: 08/26/2022 5:01:06 PM By: [name] Entered By: [name] on [name] 15:25:24 -------------------------------------------------------------------------------- Problem List Details Patient Name: Date of Service: MO [name], [name] [name] 2:30 PM Medical Record Number: [id number] Patient Account Number: [id number] Date of Birth/Sex: Treating RN: [birth year] (66 y.o. M) Primary Care Provider: Allison [name] Other  Clinician: Referring Provider: Treating Provider/Extender: [name] in Treatment: 5 Active Problems ICD-10 Encounter Code Description Active Date MDM Diagnosis L97.312 Non-pressure chronic ulcer of right ankle with fat layer exposed 07/17/2022 No Yes I10 Essential (primary) hypertension 07/17/2022 No Yes I63.40 Cerebral infarction due to embolism of unspecified cerebral artery 07/17/2022 No Yes G81.10 Spastic hemiplegia affecting unspecified side 07/17/2022 No Yes Inactive Problems Resolved Problems Electronic Signature(s) Signed: 08/26/2022 5:01:06 PM By: [name] Entered By: [name] 15:25:16 -------------------------------------------------------------------------------- Progress Note Details Patient Name: Date of Service: MO [name], [name] [name] 2:30 PM Medical Record Number: [id number] Patient Account Number: [id number] Date of Birth/Sex: Treating RN: 05-11-1956 (66 y.o. M) Primary Care Provider: Allison [name] Other Clinician: Referring Provider: Treating Provider/Extender: [name] in Treatment: 5 Subjective History of Present Illness (HPI) ADMISSION 07/17/2022 This is a 66 year old man with a past medical history significant for hypertension and embolic stroke with residual spastic hemiplegia. He apparently has had [name], [name] ([id number]) [id number] Page 4 of 5 sores on both anterior ankles for about a year. His wife has been managing them at home and says that they wax and wane to some extent and will start to get healed and then break open again. He is not diabetic and he does not smoke. More recently, the left ankle wound healed, but the right ankle wound was looking worse and she sought treatment with the patient's primary care provider. He was prescribed a 10-day course of doxycycline which he just completed and was referred to the wound care  center for further evaluation and treatment. On the right anterior ankle, just at the bend, there is an irregular ulcer with evidence of prior healing/scarring. The fat layer is exposed and there is quite a bit of slough accumulation. He has a scar on the left anterior ankle in an identical location. I wonder if he is somehow hooking his legs under something, such as a portion of his wheelchair and causing these lesions. 07/25/2022: The wound is a little bit smaller this week. There is still a layer of slough on the surface, but good granulation tissue is present underneath. 08/12/2022: The wound is half the size as it was at our previous visit. It is clean and flush with the surrounding  tissue. Light slough on the surface. [name]; the patient's wound on the dorsal right ankle is closed. He previously had wounds and mirror-image areas of both ankles and now both of them are closed. In talking to his family it seems that these were abrasion injuries. He pushes himself around in his wheelchair. However I think the exact reason is not clear Objective Constitutional Sitting or standing Blood Pressure is within target range for patient.. Pulse regular and within target range for patient.Marland Kitchen Respirations regular, non-labored and within target range.. Temperature is normal and within the target range for the patient.Marland Kitchen Appears in no distress. Vitals Time Taken: 2:45 PM, Height: 69 in, Weight: 140 lbs, BMI: 20.7, Temperature: 97.4 F, Pulse: 56 bpm, Respiratory Rate: 18 breaths/min, Blood Pressure: 129/85 mmHg. General Notes: Peripheral pulses are difficult to feel. Both wounds on the dorsal ankles are fully epithelialized. These were mirror-image wounds probably trauma. Integumentary (Hair, Skin) Wound #1 status is Open. Original cause of wound was Gradually Appeared. The date acquired was: 07/17/2021. The wound has been in treatment 5 weeks. The wound is located on the Right,Anterior Ankle. The wound  measures 0cm length x 0cm width x 0cm depth; 0cm^2 area and 0cm^3 volume. There is no tunneling or undermining noted. There is a none present amount of drainage noted. The wound margin is flat and intact. There is no granulation within the wound bed. There is no necrotic tissue within the wound bed. The periwound skin appearance had no abnormalities noted for color. The periwound skin appearance exhibited: Scarring, Dry/Scaly. Periwound temperature was noted as No Abnormality. Assessment Active Problems ICD-10 Non-pressure chronic ulcer of right ankle with fat layer exposed Essential (primary) hypertension Cerebral infarction due to embolism of unspecified cerebral artery Spastic hemiplegia affecting unspecified side Plan Discharge From [facility] Services: Discharge from [place]!!!! Anesthetic: (In clinic) Topical Lidocaine 4% applied to wound bed Consults ordered were: Podiatry - Hypertrophic nail care The following medication(s) was prescribed: lidocaine topical 4 % cream cream topical was prescribed at facility 1. The patient to be discharged from the clinic 2. I had some thoughts about protective support hose but I did not go through with this 3. The patient probably has some degree of PAD but the wounds are currently healed 4. His foot wear does not come up to this area KANAV, KAZMIERCZAK ([id number]) [id number] Page 5 of 5 Electronic Signature(s) Signed: 08/26/2022 5:01:06 PM By: [name] Entered By: [name] 15:31:22 -------------------------------------------------------------------------------- SuperBill Details Patient Name: Date of Service: MO [name], [name] [facility] [name] Medical Record Number: [id number] Patient Account Number: [id number] Date of Birth/Sex: Treating RN: 1956-06-22 (65 y.o. M) Primary Care Provider: Allison [name] Other Clinician: Referring Provider: Treating Provider/Extender:  [name] in Treatment: 5 Diagnosis Coding ICD-10 Codes Code Description [phone] Non-pressure chronic ulcer of right ankle with fat layer exposed I10 Essential (primary) hypertension I63.40 Cerebral infarction due to embolism of unspecified cerebral artery G81.10 Spastic hemiplegia affecting unspecified side Physician Procedures : CPT4 Code Description Modifier [id number] - WC PHYS LEVEL 3 - EST PT ICD-10 Diagnosis Description L97.312 Non-pressure chronic ulcer of right ankle with fat layer exposed I63.40 Cerebral infarction due to embolism of unspecified cerebral artery  G81.10 Spastic hemiplegia affecting unspecified side Quantity: 1 Electronic Signature(s) Signed: 08/26/2022 5:01:06 PM By: [name] Entered By: [name] 15:31:52

## 2022-08-28 NOTE — Progress Notes (Signed)
[name], [name] ([id number]) [id number] Page 1 of 6 Visit Report for 08/26/2022 Arrival Information Details Patient Name: Date of Service: [name] [facility] 08/26/2022 2:30 PM Medical Record Number: [id number] Patient Account Number: [id number] Date of Birth/Sex: Treating RN: 1956-09-05 (66 y.o. [name] Primary Care [name]: [name] Other Clinician: Referring [name]: Treating [name]/Extender: [name] in Treatment: 5 Visit Information History Since Last Visit Added or deleted any medications: No Patient Arrived: Wheel Chair Any new allergies or adverse reactions: No Arrival Time: 14:45 Had a fall or experienced change in No Accompanied By: Spouse activities of daily living that may affect Transfer Assistance: Manual risk of falls: Patient Requires Transmission-Based Precautions: No Signs or symptoms of abuse/neglect since last visito No Hospitalized since last visit: No Implantable device outside of the clinic excluding No cellular tissue based products placed in the center since last visit: Has Dressing in Place as Prescribed: Yes Pain Present Now: No Electronic Signature(s) Signed: 08/28/2022 2:56:09 PM By: [name] Catholic RN Entered By: [name] on 08/26/2022 15:03:22 -------------------------------------------------------------------------------- Encounter Discharge Information Details Patient Name: Date of Service: MO [name], [name] 08/26/2022 2:30 PM Medical Record Number: [id number] Patient Account Number: [id number] Date of Birth/Sex: Treating RN: 03-22-1956 (66 y.o. [name] Primary Care [name]: [name] Other Clinician: Referring [name]: Treating [name]/Extender: [name] in Treatment: 5 Encounter Discharge Information Items Discharge Condition: Stable Ambulatory Status: Wheelchair Discharge Destination: Home Transportation:  Private Auto Accompanied By: wife Schedule Follow-up Appointment: Yes Clinical Summary of Care: Patient Declined Electronic Signature(s) Signed: 08/26/2022 4:59:49 PM By: [name] Entered By: [name] on 08/26/2022 15:22:07 [name] ([id number]) [id number] Page 2 of 6 -------------------------------------------------------------------------------- Lower Extremity Assessment Details Patient Name: Date of Service: [name] [facility] 08/26/2022 2:30 PM Medical Record Number: [id number] Patient Account Number: [id number] Date of Birth/Sex: Treating RN: Jun 27, 1956 (66 y.o. [name] Primary Care [name]: [name] Other Clinician: Referring Marlies [name]: Treating [name]/Extender: [name] in Treatment: 5 Edema Assessment Assessed: [name]: No] [name]: No] [Left: Edema] [Right: :] Calf Left: Right: Point of Measurement: From Medial Instep 29 cm Ankle Left: Right: Point of Measurement: From Medial Instep 22 cm Vascular Assessment Pulses: Dorsalis Pedis Palpable: [Right:Yes] Electronic Signature(s) Signed: 08/28/2022 2:56:09 PM By: [name] Catholic RN Entered By: [name] on 08/26/2022 15:01:17 -------------------------------------------------------------------------------- Multi Wound Chart Details Patient Name: Date of Service: MO [name], [name] 08/26/2022 2:30 PM Medical Record Number: [id number] Patient Account Number: [id number] Date of Birth/Sex: Treating RN: [birth year] (66 y.o. M) Primary Care [name]: [name] Other Clinician: Referring [name]: Treating [name]/Extender: [name] in Treatment: 5 Vital Signs Height(in): 69 Pulse(bpm): 39 Weight(lbs): 140 Blood Pressure(mmHg): 129/85 Body Mass Index(BMI): 20.7 Temperature(F): 97.4 Respiratory Rate(breaths/min): 18 [1:Photos:] [N/A:N/A] Right, Anterior Ankle N/A N/A Wound  Location: Gradually Appeared N/A N/A Wounding Event: Abrasion N/A N/A Primary Etiology: Hypertension N/A N/A Comorbid History: 07/17/2021 N/A N/A Date Acquired: 5 N/A N/A Weeks of Treatment: Open N/A N/A Wound Status: No N/A N/A Wound Recurrence: 0x0x0 N/A N/A Measurements L x W x D (cm) 0 N/A N/A A (cm) : rea 0 N/A N/A Volume (cm) : 100.00% N/A N/A % Reduction in A rea: 100.00% N/A N/A % Reduction in Volume: Full Thickness Without Exposed N/A N/A Classification: Support Structures None Present N/A N/A Exudate Amount: Flat and Intact N/A N/A Wound Margin: None Present (0%) N/A N/A Granulation Amount: None Present (0%) N/A N/A Necrotic Amount: Fascia: No N/A N/A  Exposed Structures: Fat Layer (Subcutaneous Tissue): No Tendon: No Muscle: No Joint: No Bone: No Large (67-100%) N/A N/A Epithelialization: Scarring: Yes N/A N/A Periwound Skin Texture: Dry/Scaly: Yes N/A N/A Periwound Skin Moisture: No Abnormalities Noted N/A N/A Periwound Skin Color: No Abnormality N/A N/A Temperature: Treatment Notes Wound #1 (Ankle) Wound Laterality: Right, Anterior Cleanser Peri-Wound Care Topical Primary Dressing Secondary Dressing Secured With Compression Wrap Compression Stockings Add-Ons Electronic Signature(s) Signed: 08/26/2022 5:01:06 PM By: [name] Ham MD Entered By: [name] on 08/26/2022 15:25:24 -------------------------------------------------------------------------------- Multi-Disciplinary Care Plan Details Patient Name: Date of Service: MO [name], [name] 08/26/2022 2:30 PM Medical Record Number: [id number] Patient Account Number: [id number] Date of Birth/Sex: Treating RN: [birth year] (66 y.o. [name] Primary Care [name]: [name] Other Clinician: Referring [name]: Treating [name]/Extender: [name] in Treatment: 5 Active Inactive Electronic Signature(s) [name], [name]  ([id number]) [id number] Page 4 of 6 Signed: 08/26/2022 4:59:49 PM By: [name] Entered By: [name] on 08/26/2022 15:21:28 -------------------------------------------------------------------------------- Pain Assessment Details Patient Name: Date of Service: MO [name], [name] 08/26/2022 2:30 PM Medical Record Number: [id number] Patient Account Number: [id number] Date of Birth/Sex: Treating RN: January 12, 1956 (65 y.o. [name] Primary Care Reubin Bushnell: [name] Other Clinician: Referring Aeon Kessner: Treating Hollynn Garno/Extender: [name] in Treatment: 5 Active Problems Location of Pain Severity and Description of Pain Patient Has Paino No Site Locations Pain Management and Medication Current Pain Management: Electronic Signature(s) Signed: 08/28/2022 2:56:09 PM By: [name] Catholic RN Entered By: [name] on 08/26/2022 15:05:18 -------------------------------------------------------------------------------- Patient/Caregiver Education Details Patient Name: Date of Service: MO [name], [name] 12/11/2023andnbsp2:30 PM Medical Record Number: [id number] Patient Account Number: [id number] Date of Birth/Gender: Treating RN: [birth year] (66 y.o. [name] Primary Care Physician: [name] Other Clinician: Referring Physician: Treating Physician/Extender: [name] in Treatment: 5 Education Assessment Education Provided To: Patient DYLLON, HENKEN ([id number]) [id number] Page 5 of 6 Education Topics Provided Wound/Skin Impairment: Methods: Explain/Verbal Responses: Reinforcements needed, State content correctly Electronic Signature(s) Signed: 08/26/2022 4:59:49 PM By: [name] Entered By: [name] on 08/26/2022 15:21:40 -------------------------------------------------------------------------------- Wound  Assessment Details Patient Name: Date of Service: MO [name], [name] 08/26/2022 2:30 PM Medical Record Number: [id number] Patient Account Number: [id number] Date of Birth/Sex: Treating RN: 1956/08/28 (66 y.o. [name] Primary Care Gudrun Axe: [name] Other Clinician: Referring Caitlin Hillmer: Treating Teena Mangus/Extender: [name] in Treatment: 5 Wound Status Wound Number: 1 Primary Etiology: Abrasion Wound Location: Right, Anterior Ankle Wound Status: Open Wounding Event: Gradually Appeared Comorbid History: Hypertension Date Acquired: 07/17/2021 Weeks Of Treatment: 5 Clustered Wound: No Photos Wound Measurements Length: (cm) Width: (cm) Depth: (cm) Area: (cm) Volume: (cm) 0 % Reduction in Area: 100% 0 % Reduction in Volume: 100% 0 Epithelialization: Large (67-100%) 0 Tunneling: No 0 Undermining: No Wound Description Classification: Full Thickness Without Exposed Suppor Wound Margin: Flat and Intact Exudate Amount: None Present t Structures Foul Odor After Cleansing: No Slough/Fibrino No Wound Bed Granulation Amount: None Present (0%) Exposed Structure Necrotic Amount: None Present (0%) Fascia Exposed: No Fat Layer (Subcutaneous Tissue) Exposed: No Tendon Exposed: No Muscle Exposed: No Joint Exposed: No Bone Exposed: No [address] TREASURE, INGRUM ([id number]) [id number] Page 6 of 6 No Abnormalities Noted: No No Abnormalities Noted: Yes Scarring: Yes Temperature / Pain Temperature: No Abnormality Moisture No Abnormalities Noted: No Dry / Scaly: Yes Electronic Signature(s) Signed: 08/26/2022 4:59:49 PM By: [name] Entered By: [name] on 08/26/2022 15:20:19 -------------------------------------------------------------------------------- Vitals  Details Patient Name: Date of Service: [name] [facility] 08/26/2022 2:30 PM Medical Record Number:  [id number] Patient Account Number: [id number] Date of Birth/Sex: Treating RN: Jan 25, 1956 (66 y.o. [name] Primary Care Krysta Bloomfield: [name] Other Clinician: Referring Melania Kirks: Treating Lyndsay Talamante/Extender: [name] in Treatment: 5 Vital Signs Time Taken: 14:45 Temperature (F): 97.4 Height (in): 69 Pulse (bpm): 56 Weight (lbs): 140 Respiratory Rate (breaths/min): 18 Body Mass Index (BMI): 20.7 Blood Pressure (mmHg): 129/85 Reference Range: 80 - 120 mg / dl Electronic Signature(s) Signed: 08/28/2022 2:56:09 PM By: [name] Catholic RN Entered By: [name] on 08/26/2022 15:05:09

## 2022-09-23 ENCOUNTER — Ambulatory Visit: Payer: Medicare Other | Admitting: Podiatry

## 2022-09-23 DIAGNOSIS — B351 Tinea unguium: Secondary | ICD-10-CM | POA: Diagnosis not present

## 2022-09-23 DIAGNOSIS — I739 Peripheral vascular disease, unspecified: Secondary | ICD-10-CM | POA: Diagnosis not present

## 2022-09-23 DIAGNOSIS — M79674 Pain in right toe(s): Secondary | ICD-10-CM

## 2022-09-23 DIAGNOSIS — M79675 Pain in left toe(s): Secondary | ICD-10-CM | POA: Diagnosis not present

## 2022-09-23 NOTE — Progress Notes (Unsigned)
Subjective:   Patient ID: [name], male   DOB: 67 y.o.   MRN: [id number]   HPI Chief Complaint  Patient presents with   Foot Problem    toe nail culing and painful   67 year old male presents today with above concerns.  Nails are thickened elongated is not able to trim them himself in particular the right second toe as long as curving on the distal portion of the toe causing pain.  No swelling or redness or any drainage.  He has been going to wound care center for wounds on the anterior ankles bilaterally.   Review of Systems  All other systems reviewed and are negative.  Past Medical History:  Diagnosis Date   Anginal pain ([place])    Heart murmur    Hypertension    Psoriasis    "back and stomach" (02/03/2013)   Stroke ([place]) 02/01/2013   left frontal CVA and symptoms of right arm and leg weakness and expressive aphasia/notes 02/03/2013 (02/03/2013)    Past Surgical History:  Procedure Laterality Date   NO PAST SURGERIES     TEE WITHOUT CARDIOVERSION N/A 02/09/2013   Procedure: TRANSESOPHAGEAL ECHOCARDIOGRAM (TEE);  Surgeon: Lelon Perla, MD;  Location: [facility] ENDOSCOPY;  Service: Cardiovascular;  Laterality: N/A;     Current Outpatient Medications:    amLODipine (NORVASC) 10 MG tablet, Take 1 tablet (10 mg total) by mouth daily., Disp: 30 tablet, Rfl: 0   aspirin 325 MG tablet, Take 1 tablet (325 mg total) by mouth daily., Disp: , Rfl:    atorvastatin (LIPITOR) 80 MG tablet, Take 1 tablet (80 mg total) by mouth daily at 6 PM., Disp: 30 tablet, Rfl: 0   carvedilol (COREG) 6.25 MG tablet, Take 1 tablet (6.25 mg total) by mouth 2 (two) times daily with a meal., Disp: 30 tablet, Rfl: 0   lisinopril (ZESTRIL) 30 MG tablet, Take 1 tablet (30 mg total) by mouth daily., Disp: 30 tablet, Rfl: 0   senna-docusate (SENOKOT-S) 8.6-50 MG tablet, Take 1 tablet by mouth 2 (two) times daily., Disp: 30 tablet, Rfl: 0  Allergies  Allergen Reactions   Shellfish Allergy Itching           Objective:  Physical Exam  General: NAD  Dermatological: Nails are hypertrophic, dystrophic, brittle, discolored, elongated 10. No surrounding redness or drainage. Tenderness nails 1-5 bilaterally.  Hyperkeratotic lesion left foot submetatarsal 5 without any underlying ulceration drainage or signs of infection.  There is preulcerative areas noted on the distal portion of the second toe for the toenails curving down to the distal portion of the toe.  There is no drainage or pus..  Healed wound anterior ankles bilaterally.  No drainage or  Vascular: Dorsalis Pedis artery and Posterior Tibial artery pedal pulses are 1/4 with immedate capillary fill time.  There is no pain with calf compression, swelling, warmth, erythema.   Neruologic: Sensation appears decreased with SWMF.   Musculoskeletal: No other areas of discomfort.     Assessment:   67 year old male with symptomatic onychomycosis, preulcerative callus     Plan:  -Treatment options discussed including all alternatives, risks, and complications -Etiology of symptoms were discussed -Nails debrided 10 without complications or bleeding. -She will be debrided the callus without any complications or bleeding.  Moisturizer, offloading. -Daily foot inspection- he does have some decreased pulses but currently no open wounds. -Follow-up in 3 months or sooner if any problems arise. In the meantime, encouraged to call the office with any questions, concerns, change in  symptoms.   Celesta Gentile, DPM

## 2022-11-01 DIAGNOSIS — Z0000 Encounter for general adult medical examination without abnormal findings: Secondary | ICD-10-CM | POA: Diagnosis not present

## 2022-11-01 DIAGNOSIS — Z1211 Encounter for screening for malignant neoplasm of colon: Secondary | ICD-10-CM | POA: Diagnosis not present

## 2022-11-01 DIAGNOSIS — I10 Essential (primary) hypertension: Secondary | ICD-10-CM | POA: Diagnosis not present

## 2022-11-01 DIAGNOSIS — Z1159 Encounter for screening for other viral diseases: Secondary | ICD-10-CM | POA: Diagnosis not present

## 2022-11-01 DIAGNOSIS — Z122 Encounter for screening for malignant neoplasm of respiratory organs: Secondary | ICD-10-CM | POA: Diagnosis not present

## 2022-11-01 DIAGNOSIS — I679 Cerebrovascular disease, unspecified: Secondary | ICD-10-CM | POA: Diagnosis not present

## 2022-11-01 DIAGNOSIS — Z23 Encounter for immunization: Secondary | ICD-10-CM | POA: Diagnosis not present

## 2022-11-01 DIAGNOSIS — L409 Psoriasis, unspecified: Secondary | ICD-10-CM | POA: Diagnosis not present

## 2022-11-01 DIAGNOSIS — S91301A Unspecified open wound, right foot, initial encounter: Secondary | ICD-10-CM | POA: Diagnosis not present

## 2022-11-01 DIAGNOSIS — M79606 Pain in leg, unspecified: Secondary | ICD-10-CM | POA: Diagnosis not present

## 2022-11-04 ENCOUNTER — Other Ambulatory Visit: Payer: Self-pay | Admitting: Family Medicine

## 2022-11-04 ENCOUNTER — Encounter: Payer: Self-pay | Admitting: Family Medicine

## 2022-11-04 DIAGNOSIS — M79606 Pain in leg, unspecified: Secondary | ICD-10-CM

## 2022-11-04 DIAGNOSIS — Z136 Encounter for screening for cardiovascular disorders: Secondary | ICD-10-CM

## 2022-11-11 ENCOUNTER — Other Ambulatory Visit: Payer: Self-pay | Admitting: Family Medicine

## 2022-11-11 ENCOUNTER — Ambulatory Visit
Admission: RE | Admit: 2022-11-11 | Discharge: 2022-11-11 | Disposition: A | Discharge status: Home / Self Care | Payer: Medicare Other | Source: Ambulatory Visit | Attending: Family Medicine | Admitting: Family Medicine

## 2022-11-11 DIAGNOSIS — M79606 Pain in leg, unspecified: Secondary | ICD-10-CM

## 2022-11-11 DIAGNOSIS — I70203 Unspecified atherosclerosis of native arteries of extremities, bilateral legs: Secondary | ICD-10-CM | POA: Diagnosis not present

## 2022-11-21 ENCOUNTER — Emergency Department (HOSPITAL_COMMUNITY)
Admission: EM | Admit: 2022-11-21 | Discharge: 2022-11-25 | Disposition: A | Discharge status: Home / Self Care | Payer: Medicare Other | Attending: Emergency Medicine | Admitting: Emergency Medicine

## 2022-11-21 ENCOUNTER — Encounter (HOSPITAL_COMMUNITY): Payer: Self-pay

## 2022-11-21 DIAGNOSIS — R5381 Other malaise: Secondary | ICD-10-CM | POA: Diagnosis not present

## 2022-11-21 DIAGNOSIS — R627 Adult failure to thrive: Secondary | ICD-10-CM | POA: Diagnosis not present

## 2022-11-21 DIAGNOSIS — R531 Weakness: Secondary | ICD-10-CM | POA: Insufficient documentation

## 2022-11-21 DIAGNOSIS — R0902 Hypoxemia: Secondary | ICD-10-CM | POA: Diagnosis not present

## 2022-11-21 DIAGNOSIS — I499 Cardiac arrhythmia, unspecified: Secondary | ICD-10-CM | POA: Diagnosis not present

## 2022-11-21 DIAGNOSIS — F0390 Unspecified dementia without behavioral disturbance: Secondary | ICD-10-CM | POA: Insufficient documentation

## 2022-11-21 DIAGNOSIS — Z20822 Contact with and (suspected) exposure to covid-19: Secondary | ICD-10-CM | POA: Diagnosis not present

## 2022-11-21 DIAGNOSIS — Z7982 Long term (current) use of aspirin: Secondary | ICD-10-CM | POA: Insufficient documentation

## 2022-11-21 DIAGNOSIS — Z743 Need for continuous supervision: Secondary | ICD-10-CM | POA: Diagnosis not present

## 2022-11-21 DIAGNOSIS — R1111 Vomiting without nausea: Secondary | ICD-10-CM | POA: Diagnosis not present

## 2022-11-21 LAB — COMPREHENSIVE METABOLIC PANEL
ALT: 23 U/L (ref 0–44)
AST: 21 U/L (ref 15–41)
Albumin: 4 g/dL (ref 3.5–5.0)
Alkaline Phosphatase: 102 U/L (ref 38–126)
Anion gap: 9 (ref 5–15)
BUN: 17 mg/dL (ref 8–23)
CO2: 27 mmol/L (ref 22–32)
Calcium: 8.7 mg/dL — ABNORMAL LOW (ref 8.9–10.3)
Chloride: 108 mmol/L (ref 98–111)
Creatinine, Ser: 1.07 mg/dL (ref 0.61–1.24)
GFR, Estimated: >60 mL/min (ref >60)
Glucose, Bld: 88 mg/dL (ref 70–99)
Potassium: 3.4 mmol/L — ABNORMAL LOW (ref 3.5–5.1)
Sodium: 144 mmol/L (ref 135–145)
Total Bilirubin: 1.4 mg/dL — ABNORMAL HIGH (ref 0.3–1.2)
Total Protein: 8 g/dL (ref 6.5–8.1)

## 2022-11-21 LAB — URINALYSIS, ROUTINE W REFLEX MICROSCOPIC
Bacteria, UA: NONE SEEN
Bilirubin Urine: NEGATIVE
Glucose, UA: NEGATIVE mg/dL
Ketones, ur: 80 mg/dL — AB
Leukocytes,Ua: NEGATIVE
Nitrite: NEGATIVE
Protein, ur: 30 mg/dL — AB
Specific Gravity, Urine: 1.028 (ref 1.005–1.030)
pH: 5 (ref 5.0–8.0)

## 2022-11-21 LAB — CBC WITH DIFFERENTIAL/PLATELET
Abs Immature Granulocytes: 0.01 10*3/uL (ref 0.00–0.07)
Basophils Absolute: 0 10*3/uL (ref 0.0–0.1)
Basophils Relative: 0 %
Eosinophils Absolute: 0.1 10*3/uL (ref 0.0–0.5)
Eosinophils Relative: 2 %
HCT: 45.9 % (ref 39.0–52.0)
Hemoglobin: 14.5 g/dL (ref 13.0–17.0)
Immature Granulocytes: 0 %
Lymphocytes Relative: 32 %
Lymphs Abs: 1.3 10*3/uL (ref 0.7–4.0)
MCH: 27.3 pg (ref 26.0–34.0)
MCHC: 31.6 g/dL (ref 30.0–36.0)
MCV: 86.3 fL (ref 80.0–100.0)
Monocytes Absolute: 0.4 10*3/uL (ref 0.1–1.0)
Monocytes Relative: 10 %
Neutro Abs: 2.3 10*3/uL (ref 1.7–7.7)
Neutrophils Relative %: 56 %
Platelets: 203 10*3/uL (ref 150–400)
RBC: 5.32 MIL/uL (ref 4.22–5.81)
RDW: 14.4 % (ref 11.5–15.5)
WBC: 4 10*3/uL (ref 4.0–10.5)
nRBC: 0 % (ref 0.0–0.2)

## 2022-11-21 LAB — RESP PANEL BY RT-PCR (RSV, FLU A&B, COVID)  RVPGX2
Influenza A by PCR: NEGATIVE
Influenza B by PCR: NEGATIVE
Resp Syncytial Virus by PCR: NEGATIVE
SARS Coronavirus 2 by RT PCR: NEGATIVE

## 2022-11-21 LAB — MAGNESIUM: Magnesium: 2.2 mg/dL (ref 1.7–2.4)

## 2022-11-21 LAB — LACTIC ACID, PLASMA: Lactic Acid, Venous: 1.9 mmol/L (ref 0.5–1.9)

## 2022-11-21 MED ORDER — ATORVASTATIN CALCIUM 40 MG PO TABS
40.0000 mg | ORAL_TABLET | Freq: Every day | ORAL | Status: DC
  Administered 2022-11-21 – 2022-11-24 (×4): 40 mg via ORAL
  Filled 2022-11-21 (×4): qty 1

## 2022-11-21 MED ORDER — AMLODIPINE BESYLATE 5 MG PO TABS
10.0000 mg | ORAL_TABLET | Freq: Every day | ORAL | Status: DC
  Administered 2022-11-22 – 2022-11-25 (×4): 10 mg via ORAL
  Filled 2022-11-21 (×4): qty 2

## 2022-11-21 MED ORDER — ATORVASTATIN CALCIUM 40 MG PO TABS: 80.0000 mg | ORAL_TABLET | Freq: Every day | ORAL | Status: DC

## 2022-11-21 MED ORDER — LISINOPRIL 20 MG PO TABS: 20.0000 mg | ORAL_TABLET | Freq: Every day | ORAL | Status: DC

## 2022-11-21 MED ORDER — SENNOSIDES-DOCUSATE SODIUM 8.6-50 MG PO TABS
1.0000 | ORAL_TABLET | Freq: Two times a day (BID) | ORAL | Status: DC
  Administered 2022-11-21 – 2022-11-25 (×8): 1 via ORAL
  Filled 2022-11-21 (×7): qty 1

## 2022-11-21 MED ORDER — ASPIRIN 325 MG PO TABS
325.0000 mg | ORAL_TABLET | Freq: Every day | ORAL | Status: DC
  Administered 2022-11-22 – 2022-11-25 (×4): 325 mg via ORAL
  Filled 2022-11-21 (×4): qty 1

## 2022-11-21 MED ORDER — SODIUM CHLORIDE 0.9 % IV BOLUS
1000.0000 mL | Freq: Once | INTRAVENOUS | Status: AC
  Administered 2022-11-21: 1000 mL via INTRAVENOUS

## 2022-11-21 MED ORDER — LISINOPRIL 20 MG PO TABS
30.0000 mg | ORAL_TABLET | Freq: Every day | ORAL | Status: DC
  Administered 2022-11-22 – 2022-11-25 (×4): 30 mg via ORAL
  Filled 2022-11-21 (×4): qty 1

## 2022-11-21 MED ORDER — CARVEDILOL 3.125 MG PO TABS: 6.2500 mg | ORAL_TABLET | Freq: Two times a day (BID) | ORAL | Status: DC

## 2022-11-21 NOTE — ED Triage Notes (Signed)
Pt presents with c/o failure to thrive. Pt is at his baseline per EMS. Pt has dementia, wife is the normal caregiver for the pt but wife was found deceased by EMS when they arrived. Pt was cold to the touch upon EMS arrival. Pt is able to speak to EMS but confused. EMS reports there are wheelchairs and adult depends in the house for pt.

## 2022-11-21 NOTE — Progress Notes (Signed)
  PT Cancellation Note  Patient Details Name: [name] MRN: [id number] DOB: Aug 27, 1956   Cancelled Treatment:    Reason Eval/Treat Not Completed: Patient at procedure or test/unavailable, nursing  with  patient. Will check back another time. [place] Office [phone] Weekend pager-[phone]    Claretha Cooper 11/21/2022, 2:20 PM

## 2022-11-21 NOTE — ED Provider Notes (Signed)
[facility] Provider Note   CSN: [id number] Arrival date & time: 11/21/22  [id number]     History  Chief Complaint  Patient presents with   Failure To Thrive    [name] is a 67 y.o. male w/ vascular dementia, spastic hemiplegia, FTT, presenting to ED by EMS for failure to thrive.  EMS had reported that patient's wife was "found deceased in the house."  Patient does not appear cognizant of this; tells me he lives with his wife at home.  He has no acute complaints and does not know why he is here.  HPI     Home Medications Prior to Admission medications   Medication Sig Start Date End Date Taking? Authorizing Provider  amLODipine (NORVASC) 10 MG tablet Take 1 tablet (10 mg total) by mouth daily. 09/17/19   Aline August, MD  aspirin 325 MG tablet Take 1 tablet (325 mg total) by mouth daily. 02/25/13   Angiulli, Lavon Paganini, PA-C  atorvastatin (LIPITOR) 80 MG tablet Take 1 tablet (80 mg total) by mouth daily at 6 PM. 09/16/19   Aline August, MD  carvedilol (COREG) 6.25 MG tablet Take 1 tablet (6.25 mg total) by mouth 2 (two) times daily with a meal. 09/16/19   Aline August, MD  lisinopril (ZESTRIL) 30 MG tablet Take 1 tablet (30 mg total) by mouth daily. 09/17/19   Aline August, MD  senna-docusate (SENOKOT-S) 8.6-50 MG tablet Take 1 tablet by mouth 2 (two) times daily. 09/16/19   Aline August, MD      Allergies    Shellfish allergy    Review of Systems   Review of Systems  Physical Exam Updated Vital Signs BP 133/83   Pulse 84   Temp 98.3 F (36.8 C) (Oral)   Resp 18   SpO2 100%  Physical Exam Constitutional:      General: He is not in acute distress.    Comments: Thin, frail, chronically ill appearing  HENT:     Head: Normocephalic and atraumatic.  Eyes:     Conjunctiva/sclera: Conjunctivae normal.     Pupils: Pupils are equal, round, and reactive to light.  Cardiovascular:     Rate and Rhythm: Normal rate and regular  rhythm.     Pulses: Normal pulses.  Pulmonary:     Effort: Pulmonary effort is normal. No respiratory distress.  Abdominal:     General: There is no distension.     Tenderness: There is no abdominal tenderness.  Skin:    General: Skin is warm and dry.  Neurological:     Mental Status: He is alert.     Comments: Left-sided hemiplegia, spastic hemiplegia  Psychiatric:        Mood and Affect: Mood normal.        Behavior: Behavior normal.     ED Results / Procedures / Treatments   Labs (all labs ordered are listed, but only abnormal results are displayed) Labs Reviewed  COMPREHENSIVE METABOLIC PANEL - Abnormal; Notable for the following components:      Result Value   Potassium 3.4 (*)    Calcium 8.7 (*)    Total Bilirubin 1.4 (*)    All other components within normal limits  RESP PANEL BY RT-PCR (RSV, FLU A&B, COVID)  RVPGX2  CULTURE, BLOOD (ROUTINE X 2)  CULTURE, BLOOD (ROUTINE X 2)  CBC WITH DIFFERENTIAL/PLATELET  MAGNESIUM  LACTIC ACID, PLASMA  URINALYSIS, ROUTINE W REFLEX MICROSCOPIC    EKG None  Radiology No results found.  Procedures Procedures    Medications Ordered in ED Medications  amLODipine (NORVASC) tablet 10 mg (has no administration in time range)  aspirin tablet 325 mg (has no administration in time range)  atorvastatin (LIPITOR) tablet 80 mg (has no administration in time range)  carvedilol (COREG) tablet 6.25 mg (has no administration in time range)  lisinopril (ZESTRIL) tablet 20 mg (has no administration in time range)  senna-docusate (Senokot-S) tablet 1 tablet (has no administration in time range)  sodium chloride 0.9 % bolus 1,000 mL (1,000 mLs Intravenous New Bag/Given 11/21/22 0913)    ED Course/ Medical Decision Making/ A&P                             Medical Decision Making Amount and/or Complexity of Data Reviewed Labs: ordered.  Risk OTC drugs. Prescription drug management.   This patient presents to the ED with concern  for failure to thrive. This involves an extensive number of treatment options, and is a complaint that carries with it a high risk of complications and morbidity.  The differential diagnosis includes poor oral intake 2/2 dementia most likely, vs infection vs other  Pt mildly hypothermic on arrival with rectal temp 95.22F.  However this may be environmental given his frail body habitus.  We'll rewarm with blankets and perform infection workup all the same.  Co-morbidities that complicate the patient evaluation: Advanced dementia  Additional history obtained from patient's son, EMS  External records from outside source obtained and reviewed including neurology eval Dec 2021 noting "visual and gait impairment and cognitive impairment" as wel las hx of "right posterior cerebral artery infarct due to right posterior cerebral artery occlusion from large vessel disease in the setting cocaine abuse multiple vascular risk factors such as hypertension, hyperlipidemia, intracranial atherosclerosis.  Patient has significant cognitive impairment, moderate dysarthria, left-sided neglect, gait impairment, and dense left homonymous hemianopsia."  I ordered and personally interpreted labs.  The pertinent results include: No emergent findings on labs  Patient's hypothermia resolved with active rewarming.  Have a lower suspicion for sepsis.  White blood cell count and lactate were within normal limits  The patient was maintained on a cardiac monitor.  I personally viewed and interpreted the cardiac monitored which showed an underlying rhythm of: Tachycardia and sinus rhythm I ordered medication including home medication for boarding stay  I have reviewed the patients home medicines and have made adjustments as needed  Test Considered: I doubt a new stroke or pulmonary embolism  After the interventions noted above, I reevaluated the patient and found that they have: stayed the same  Social Determinants of Health:  social work has been consulted regarding report of patient's wife being found deceased in the house.  We will need to verify this information and look for secondary emergency contact or potential caretaker.    Dispostion:  Given the abrupt change in the patient's home living situation with the unfortunate recent passing of his wife, who is his primary caretaker, his son who is his only other immediate family member is not able to provide the care that he needs at home.  The patient is requiring near full-time care with his level of dementia and his poor mobility.  After consideration of the diagnostic results and the patients response to treatment, I feel that the patent would benefit from social work consultation for SNF for nursing facility placement  Final Clinical Impression(s) / ED Diagnoses Final diagnoses:  Weakness    Rx / DC Orders ED Discharge Orders     None         Wyvonnia Dusky, MD 11/21/22 [phone]

## 2022-11-21 NOTE — Progress Notes (Signed)
CSW tried to contact patient's son N/A. Loree Fee reports son can not care for the dad. TOC will work on plan to SNF.

## 2022-11-21 NOTE — Progress Notes (Addendum)
Transition of Care [facility]) - Emergency Department Mini Assessment   Patient Details  Name: [name] MRN: [id number] Date of Birth: [birth year]  Transition of Care [facility]) CM/SW Contact:    [name] Relic, LCSW Phone Number: 11/21/2022, 9:42 AM   Clinical Narrative: Pt presented to ED for failure to thrive. It was reported that the pt's wife was found deceased by EMS when they arrived to the home. Pt has dementia and is confused at baseline. TOC consulted to verify that the pt's wife is deceased and speak to family about plans for the pt.  This CSW contacted Non Emergency dispatch to verify that the pt's wife is deceased. Dispatch did inform that upon EMS arrival to the home, the wife was found deceased and the pt was brought to [facility]. This Probation officer outreached to the pt's son, [name], who was present at the ED and aware of his mother's passing. [name] informed that he is not in a place where he can take care of his father at this time. He also stated he is unsure if any other family members are able to care for him. [name] reported that the pt has been dealing with dementia for the past couple years. He reported the pt has previously had two strokes. At this time, [name] is interested in short term rehab for the patient. The pt does not have a long term payor source and [name] is unaware of the pt's finances at this time as he reported his mother (pt's wife) was the primary caregiver. This Probation officer offered for the pt to be referred to [facility] and [name] is in agreement.  This CSW completed a referral to [facility] and staffed the case with [name] via phone. [name] will outreach to [employer]. [name] is aware.   [name] denied that he or any other family members is the pt's legal guardian, POA, or [place]. This CSW asked [name] if he wishes to speak with the Chaplain and [name] denied at this time.   Case staffed with Dr. Langston Masker, MD, who is uncertain if pt will be admitted. If not,  pt will be worked up for SNF placement. [name] has ordered PT/OT. TOC following.  ED Mini Assessment: What brought you to the Emergency Department? : Failure to thrive  Barriers to Discharge: SNF Pending bed offer, ED SNF auth  Barrier interventions: SNF workup/insurance info  Means of departure: Not know       Patient Contact and Communications Key Contact 1: [name] (son)   Spoke with: Non-Emergency dispatch and Son Contact Date: 11/21/22,   Contact time: 0926 Contact Phone Number: Non Emergency - [phone] / [name] (Son) [phone] Call outcome: Son interested in SNF    CMS Medicare.gov Compare Post Acute Care list provided to:: Patient Represenative (must comment) (Son) Choice offered to / list presented to : Adult Children  Admission diagnosis:  ftt Patient Active Problem List   Diagnosis Date Noted   Hypokalemia 08/31/2019   Cocaine abuse ([place]) 08/29/2019   AKI (acute kidney injury) ([place]) 08/29/2019   Hyperlipidemia 08/29/2019   Confusion 08/29/2019   Acute ischemic right PCA stroke ([place]) 08/27/2019   Aphasia as late effect of cerebrovascular accident 05/14/2013   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

## 2022-11-21 NOTE — Progress Notes (Signed)
This CSW has spoke to [name] has reported that at this time the family has a conflict with patients care. At this time [name] believes  that the best thing for the patient is a SNF. TOC will continue to follow. Awaiting PT evaluation.

## 2022-11-22 NOTE — Evaluation (Signed)
Physical Therapy Evaluation Patient Details Name: Brijesh Vite MRN: [id number] DOB: 06-25-1956 Today's Date: 11/22/2022  History of Present Illness  eorge Krauser is a 67 y.o. male w/ vascular dementia, , stroke,spastic hemiplegia, FTT, presenting to ED by EMS for failure to thrive.  EMS had reported that patient's wife was "found deceased in the house.  Clinical Impression  Pt admitted with above diagnosis.  Pt currently with functional limitations due to the deficits listed below (see PT Problem List). Pt will benefit from skilled PT to increase their independence and safety with mobility to allow discharge to the venue listed below.   The patient   is able to ambulate with min assistance using RW , requiring guidance of Rw , noted right gaze preference.  Continue PT  for functional mobility.         Recommendations for follow up therapy are one component of a multi-disciplinary discharge planning process, led by the attending physician.  Recommendations may be updated based on patient status, additional functional criteria and insurance authorization.  Follow Up Recommendations Skilled nursing-short term rehab (<3 hours/day) Can patient physically be transported by private vehicle: No    Assistance Recommended at Discharge Frequent or constant Supervision/Assistance  Patient can return home with the following  A little help with walking and/or transfers;A lot of help with bathing/dressing/bathroom;Assistance with feeding    Equipment Recommendations None recommended by PT  Recommendations for Other Services       Functional Status Assessment Patient has had a recent decline in their functional status and demonstrates the ability to make significant improvements in function in a reasonable and predictable amount of time.     Precautions / Restrictions Precautions Precautions: Fall Restrictions Weight Bearing Restrictions: No      Mobility  Bed Mobility Overal bed  mobility: Needs Assistance Bed Mobility: Supine to Sit, Sit to Supine     Supine to sit: Min assist Sit to supine: Min assist   General bed mobility comments: multimodal cues to move legs to left side of bed, assist at trunk to sit up.  much extra time and cues to return to sitting down onto  bed,  min assist to place legs onto bed.    Transfers Overall transfer level: Needs assistance Equipment used: Rolling walker (2 wheels) Transfers: Sit to/from Stand Sit to Stand: Min assist           General transfer comment: hand over hand  to place each hand onto RW.  Tactile Cues to initiate forward progression  with RW    Ambulation/Gait Ambulation/Gait assistance: Min assist Gait Distance (Feet): 50 Feet Assistive device: Rolling walker (2 wheels) Gait Pattern/deviations: Step-to pattern, Step-through pattern, Decreased step length - left, Decreased weight shift to left, Trunk flexed Gait velocity: decr     General Gait Details: multimodal cues  for RW use, assist with keeping in forward progression, right gaze preference, asssit to move RW around door frames and objects  Stairs            Wheelchair Mobility    Modified Rankin (Stroke Patients Only)       Balance Overall balance assessment: Needs assistance Sitting-balance support: Bilateral upper extremity supported, Feet supported Sitting balance-Leahy Scale: Fair Sitting balance - Comments: sits at midline, did not initaite  leaning forward   Standing balance support: Bilateral upper extremity supported, During functional activity, Reliant on assistive device for balance Standing balance-Leahy Scale: Poor Standing balance comment: supports on Rw  Pertinent Vitals/Pain Pain Assessment Pain Assessment: No/denies pain    Home Living Family/patient expects to be discharged to:: Skilled nursing facility                        Prior Function Prior Level of  Function : Patient poor historian/Family not available             Mobility Comments: unsure       Hand Dominance   Dominant Hand: Right    Extremity/Trunk Assessment        Lower Extremity Assessment Lower Extremity Assessment: Generalized weakness (gross AROM both legs, slight balistic movements)    Cervical / Trunk Assessment Cervical / Trunk Assessment: Other exceptions Cervical / Trunk Exceptions: right  gaze preference, head turned to right most of time  Communication   Communication: Receptive difficulties;Expressive difficulties  Cognition Arousal/Alertness: Awake/alert Behavior During Therapy: WFL for tasks assessed/performed Overall Cognitive Status: No family/caregiver present to determine baseline cognitive functioning Area of Impairment: Orientation, Following commands                 Orientation Level: Time, Situation     Following Commands: Follows one step commands inconsistently       General Comments: requires  tactile/visual cues        General Comments      Exercises     Assessment/Plan    PT Assessment Patient needs continued PT services  PT Problem List Decreased strength;Decreased activity tolerance;Decreased mobility;Decreased cognition;Decreased safety awareness;Decreased balance       PT Treatment Interventions DME instruction;Therapeutic activities;Balance training;Cognitive remediation;Gait training;Functional mobility training;Patient/family education;Neuromuscular re-education;Therapeutic exercise    PT Goals (Current goals can be found in the Care Plan section)  Acute Rehab PT Goals PT Goal Formulation: Patient unable to participate in goal setting Time For Goal Achievement: 12/06/22 Potential to Achieve Goals: Fair    Frequency Min 2X/week     Co-evaluation PT/OT/SLP Co-Evaluation/Treatment: Yes Reason for Co-Treatment: For patient/therapist safety;To address functional/ADL transfers PT goals addressed  during session: Mobility/safety with mobility OT goals addressed during session: ADL's and self-care       AM-PAC PT "6 Clicks" Mobility  Outcome Measure Help needed turning from your back to your side while in a flat bed without using bedrails?: A Lot Help needed moving from lying on your back to sitting on the side of a flat bed without using bedrails?: A Little Help needed moving to and from a bed to a chair (including a wheelchair)?: A Little Help needed standing up from a chair using your arms (e.g., wheelchair or bedside chair)?: A Little Help needed to walk in hospital room?: A Little Help needed climbing 3-5 steps with a railing? : Total 6 Click Score: 15    End of Session Equipment Utilized During Treatment: Gait belt Activity Tolerance: Patient tolerated treatment well Patient left: in bed;with call bell/phone within reach;with nursing/sitter in room (outside room) Nurse Communication: Mobility status PT Visit Diagnosis: Unsteadiness on feet (R26.81);Other symptoms and signs involving the nervous system (R29.898)    [id number] PT Time Calculation (min) (ACUTE ONLY): 20 min   Charges:   PT Evaluation $PT Eval Low Complexity: 1 Low          [place] Office [phone] Weekend [id number]   Claretha Cooper 11/22/2022, 9:38 AM

## 2022-11-22 NOTE — NC FL2 (Signed)
[place] LEVEL OF CARE FORM     IDENTIFICATION  Patient Name: [name] Birthdate: March 14, 1956 Sex: male Admission Date (Current Location): 11/21/2022  [facility] and [place] Number:  Herbalist and Address:  [facility],  [place], [place]      Provider Number: [id number]  Attending Physician Name and Address:  Default, Provider, MD  Relative Name and Phone Number:  Jiles Prows (son) - [phone]    Current Level of Care: Hospital Recommended Level of Care: [place] Prior Approval Number:    Date Approved/Denied:   PASRR Number: [id number]  Discharge Plan: SNF    Current Diagnoses: Patient Active Problem List   Diagnosis Date Noted   Hypokalemia 08/31/2019   Cocaine abuse ([place]) 08/29/2019   AKI (acute kidney injury) ([place]) 08/29/2019   Hyperlipidemia 08/29/2019   Confusion 08/29/2019   Acute ischemic right PCA stroke ([place]) 08/27/2019   Aphasia as late effect of cerebrovascular accident 05/14/2013   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   Spastic hemiplegia affecting dominant side ([place]) [id number]   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   CVA (cerebral infarction) 02/03/2013   HTN (hypertension) 02/03/2013   Tobacco abuse 02/03/2013    Orientation RESPIRATION BLADDER Height & Weight     Self  Normal Incontinent Weight:   Height:     BEHAVIORAL SYMPTOMS/MOOD NEUROLOGICAL BOWEL NUTRITION STATUS      Incontinent Diet (Regular)  AMBULATORY STATUS COMMUNICATION OF NEEDS Skin       Normal                       Personal Care Assistance Level of Assistance  Bathing, Feeding, Dressing Bathing Assistance: Limited assistance Feeding assistance: Limited assistance Dressing Assistance: Limited assistance     Functional Limitations Info  Sight, Hearing, Speech Sight Info: Impaired (Due to prior stroke) Hearing Info: Adequate Speech Info: Impaired (Aphasia)    SPECIAL  CARE FACTORS FREQUENCY  PT (By licensed PT), OT (By licensed OT)     PT Frequency: x5/week OT Frequency: x5/week            Contractures Contractures Info: Not present    Additional Factors Info  Code Status, Allergies, Psychotropic Code Status Info: Full Allergies Info: Shellfish Allergy Psychotropic Info: None         Current Medications (11/22/2022):  This is the current hospital active medication list Current Facility-Administered Medications  Medication Dose Route Frequency Provider Last Rate Last Admin   amLODipine (NORVASC) tablet 10 mg  10 mg Oral Daily Wyvonnia Dusky, MD   10 mg at 11/22/22 [id number]   aspirin tablet 325 mg  325 mg Oral Daily Wyvonnia Dusky, MD   325 mg at 11/22/22 0956   atorvastatin (LIPITOR) tablet 40 mg  40 mg Oral q1800 Wyvonnia Dusky, MD   40 mg at 11/21/22 1813   lisinopril (ZESTRIL) tablet 30 mg  30 mg Oral Daily Wyvonnia Dusky, MD   30 mg at 11/22/22 [id number]   senna-docusate (Senokot-S) tablet 1 tablet  1 tablet Oral BID Wyvonnia Dusky, MD   1 tablet at 11/22/22 [id number]   Current Outpatient Medications  Medication Sig Dispense Refill   amLODipine (NORVASC) 10 MG tablet Take 1 tablet (10 mg total) by mouth daily. 30 tablet 0   aspirin 325 MG tablet Take 1 tablet (325 mg total) by mouth daily.     atorvastatin (LIPITOR) 40 MG tablet Take 40 mg  by mouth at bedtime.     atorvastatin (LIPITOR) 80 MG tablet Take 1 tablet (80 mg total) by mouth daily at 6 PM. (Patient not taking: Reported on 11/21/2022) 30 tablet 0   carvedilol (COREG) 6.25 MG tablet Take 1 tablet (6.25 mg total) by mouth 2 (two) times daily with a meal. 30 tablet 0   lisinopril (ZESTRIL) 30 MG tablet Take 1 tablet (30 mg total) by mouth daily. 30 tablet 0   senna-docusate (SENOKOT-S) 8.6-50 MG tablet Take 1 tablet by mouth 2 (two) times daily. 30 tablet 0     Discharge Medications: Please see discharge summary for a list of discharge medications.  Relevant Imaging  Results:  Relevant Lab Results:   Additional Information SSN: [id number]  Kimber Relic, LCSW

## 2022-11-22 NOTE — Evaluation (Signed)
Occupational Therapy Evaluation Patient Details Name: [name] MRN: [id number] DOB: 05-18-1956 Today's Date: 11/22/2022   History of Present Illness eorge [name] is a 67 y.o. male w/ vascular dementia, , stroke,spastic hemiplegia, FTT, presenting to ED by EMS for failure to thrive.  EMS had reported that patient's wife was "found deceased in the house.   Clinical Impression   [name] is a 67 year old man who presents today with cognitive impairments, visual impairments, gait impairment and impaired balance. On evaluation he demonstrated good upper body strength but poor motor planning needing min-mod assist for UB ADLs and mod-total assist for LB ADLs. He was min assist to stand ambulate with walker needing assist to manage the walker and negotiate environment. He is pleasant and alert to self but not situation. He did demonstrate some difficulty initiating use of left upper extremity at times. He requires heavy multimodal cueing to perform tasks - outside of simply washing his face and eating. Unsure of his prior baseline - though know that his wife was his caregiver. Patient will benefit from skilled OT services while in hospital to improve functional abilities and reduce caregiver burden.       Recommendations for follow up therapy are one component of a multi-disciplinary discharge planning process, led by the attending physician.  Recommendations may be updated based on patient status, additional functional criteria and insurance authorization.   Follow Up Recommendations  Skilled nursing-short term rehab (<3 hours/day)     Assistance Recommended at Discharge Frequent or constant Supervision/Assistance  Patient can return home with the following A little help with walking and/or transfers;A lot of help with bathing/dressing/bathroom;Assistance with cooking/housework;Direct supervision/assist for financial management;Assistance with feeding;Assist for transportation;Direct  supervision/assist for medications management;Help with stairs or ramp for entrance    Functional Status Assessment  Patient has had a recent decline in their functional status and/or demonstrates limited ability to make significant improvements in function in a reasonable and predictable amount of time  Equipment Recommendations  None recommended by OT    Recommendations for Other Services       Precautions / Restrictions Precautions Precautions: Fall Restrictions Weight Bearing Restrictions: No      Mobility Bed Mobility Overal bed mobility: Needs Assistance Bed Mobility: Supine to Sit, Sit to Supine     Supine to sit: Min assist Sit to supine: Min assist   General bed mobility comments: multimodal cues to move legs to left side of bed, assist at trunk to sit up.  much extra time and cues to return to sit down onto  bed,  min assist to place legs onto bed.    Transfers Overall transfer level: Needs assistance Equipment used: Rolling walker (2 wheels) Transfers: Sit to/from Stand Sit to Stand: Min assist           General transfer comment: hand over hand  to place each hand onto RW.  Tactile Cues to initiate forward progresion  with RW      Balance Overall balance assessment: Needs assistance Sitting-balance support: No upper extremity supported, Feet supported Sitting balance-Leahy Scale: Fair Sitting balance - Comments: sits at midline, did not initaite  leaning forward   Standing balance support: Bilateral upper extremity supported, During functional activity, Reliant on assistive device for balance Standing balance-Leahy Scale: Poor Standing balance comment: supports on Rw                           ADL either performed  or assessed with clinical judgement   ADL Overall ADL's : Needs assistance/impaired Eating/Feeding: Set up;Supervision/ safety;Minimal assistance Eating/Feeding Details (indicate cue type and reason): needs setup, needs food and  utensil placed in hands. Able to feed himself applesauce - holding cup in left hand and spooning with right. Grooming: Set up;Wash/dry face Grooming Details (indicate cue type and reason): He washed his face but with poor quality. Upper Body Bathing: Moderate assistance;Sitting;Cueing for sequencing Upper Body Bathing Details (indicate cue type and reason): needs heavy cueing Lower Body Bathing: Maximal assistance;Cueing for sequencing Lower Body Bathing Details (indicate cue type and reason): needs heavy multimodal cueing Upper Body Dressing : Moderate assistance;Cueing for sequencing Upper Body Dressing Details (indicate cue type and reason): needs heavy multimodal cueing Lower Body Dressing: Maximal assistance;Cueing for sequencing Lower Body Dressing Details (indicate cue type and reason): needs heavy multimodal  cueing Toilet Transfer: Minimal assistance;Regular Toilet;Rolling walker (2 wheels);Grab bars;Cueing for sequencing   Toileting- Clothing Manipulation and Hygiene: Total assistance;Sit to/from stand Toileting - Clothing Manipulation Details (indicate cue type and reason): presumably incontinent - wearing a diaper     Functional mobility during ADLs: Minimal assistance;Rolling walker (2 wheels) General ADL Comments: Needs assist for walker management and with initial stand     Vision   Additional Comments: Hx of occipital stroke in 2020 with questionable left sided hemianopsia vs left visual field deficit . has some visual deficits, has right gaze preference.     Perception     Praxis      Pertinent Vitals/Pain Pain Assessment Pain Assessment: No/denies pain     Hand Dominance Right   Extremity/Trunk Assessment Upper Extremity Assessment Upper Extremity Assessment: RUE deficits/detail;LUE deficits/detail RUE Deficits / Details: Has functional ROM and strength, can use right functionally to feed himself and wash his face. LUE Deficits / Details: Has functional ROm  and strength -- exhibits some difficulty using LUE - predominantly not using it or moving it when cued by therapist   Lower Extremity Assessment Lower Extremity Assessment: Defer to PT evaluation   Cervical / Trunk Assessment Cervical / Trunk Assessment: Other exceptions Cervical / Trunk Exceptions: right  gaze preference, head turned to right most of time   Communication Communication Communication: Receptive difficulties;Expressive difficulties   Cognition Arousal/Alertness: Awake/alert Behavior During Therapy: WFL for tasks assessed/performed Overall Cognitive Status: No family/caregiver present to determine baseline cognitive functioning                                 General Comments: requires  tactile/visual cues. From prior OT note patient had CVA that resulted in cognitive impairment     General Comments       Exercises     Shoulder Instructions      Home Living Family/patient expects to be discharged to:: Skilled nursing facility                                        Prior Functioning/Environment Prior Level of Function : Patient poor historian/Family not available             Mobility Comments: unsure ADLs Comments: unsure        OT Problem List: Impaired balance (sitting and/or standing);Impaired vision/perception;Decreased coordination;Decreased cognition;Decreased safety awareness;Decreased knowledge of use of DME or AE;Impaired UE functional use;Decreased activity tolerance      OT  Treatment/Interventions: Self-care/ADL training;Therapeutic exercise;DME and/or AE instruction;Therapeutic activities;Balance training;Patient/family education    OT Goals(Current goals can be found in the care plan section) Acute Rehab OT Goals OT Goal Formulation: Patient unable to participate in goal setting Time For Goal Achievement: 12/06/22 Potential to Achieve Goals: Fair  OT Frequency: Min 2X/week    Co-evaluation   Reason for  Co-Treatment: For patient/therapist safety;To address functional/ADL transfers PT goals addressed during session: Mobility/safety with mobility OT goals addressed during session: ADL's and self-care      AM-PAC OT "6 Clicks" Daily Activity     Outcome Measure Help from another person eating meals?: A Little Help from another person taking care of personal grooming?: A Little Help from another person toileting, which includes using toliet, bedpan, or urinal?: Total Help from another person bathing (including washing, rinsing, drying)?: A Lot Help from another person to put on and taking off regular upper body clothing?: A Lot Help from another person to put on and taking off regular lower body clothing?: A Lot 6 Click Score: 13   End of Session Equipment Utilized During Treatment: Gait belt;Rolling walker (2 wheels) Nurse Communication: Mobility status  Activity Tolerance: Patient tolerated treatment well Patient left: in bed;with call bell/phone within reach;with bed alarm set;with nursing/sitter in room  OT Visit Diagnosis: Other symptoms and signs involving cognitive function;Unsteadiness on feet (R26.81)                Time: [id number] OT Time Calculation (min): 20 min Charges:  OT General Charges $OT Visit: 1 Visit OT Evaluation $OT Eval Low Complexity: 1 Low  Gustavo Lah, OTR/L [place]  Office [phone]   Lenward Chancellor 11/22/2022, 10:10 AM

## 2022-11-22 NOTE — Progress Notes (Addendum)
Attempted to contact the pt's son to present bed offers. Left HIPAA Compliant voicemail.  Addend @ 1:43 PM Presented bed offers to son and son's [name].  Family has accepted [facility] bed offer. Notified Star @ [place].  Addend @ 2:14 PM Auth initiated. Pt can admit to [facility] on Monday.   Late Addend Spoke with pt's son, [name], and [name] on 3/8. [name] informed this CSW "I've made up my mind and I'm just going to let the state deal with it because I can't care for my father and no other family can. I work two jobs, I can't do it." This CSW inquired about whether he'd began the guardianship process. [name] reported he first spoke with guardianship staff on 3/7 and stated he would be going to file guardianship paperwork on 3/8 in the afternoon. [name] stated he was told the process would tke 15 days or longer. This Probation officer explained to [place] that the hospital can place for short term rehab but it is important for him to being the guardianship process now so things could be worked out while the pt is receiving short term rehab. [name] and his [name] is aware that the pt will need to transition into LTC from STR. Both [name] and [name] verbalized understanding.   This Probation officer also spoke with [name] with [employer] [facility] [employer] who is working with [name] wife, [name]. [name] informed that [name] would be assisting [name] with filing for guardianship on 3/8. [name] has provided [name] with financial information regarding the pt's savings. [name] has discussed with the family a plan to file for guardianship, and private pay for Memory Care. Per [name], family is in agreement with plan. [name] contacted [name] and requested that this writer not contact [name] and speak with her directly. This Probation officer informed that we must continue to speak with [name] as he is the pt's son and only person listed as Emergency Contact. TOC will follow to see if [name] requests that [facility] speaks directly with his  wife - As of 3/9, [name] has not made that request. Pt's auth still pending. TOC followiing.

## 2022-11-22 NOTE — Progress Notes (Signed)
Attempted to contact pt's son - left HIPAA Compliant voicemail.

## 2022-11-22 NOTE — ED Notes (Signed)
Patient refusing dinner at this time.

## 2022-11-22 NOTE — ED Provider Notes (Signed)
Emergency Medicine Observation Re-evaluation Note  [name] is a 67 y.o. male, seen on rounds today.  Pt initially presented to the ED for complaints of Failure To Thrive Currently, the patient is resting on the bed without acute distress.  Physical Exam  BP (!) 120/90   Pulse 68   Temp 98.3 F (36.8 C) (Oral)   Resp 18   SpO2 98%  Physical Exam General: Resting Cardiac: No murmur on my initial exam Lungs: Clear breath sounds bilaterally Psych: No acute agitation  ED Course / MDM  EKG:   I have reviewed the labs performed to date as well as medications administered while in observation.  Recent changes in the last 24 hours include none reported by overnight nursing.  Plan  Current plan is for awaiting evaluation by PT/social work with likely SNF placement per last documentation.    Hilja Kintzel, Gwenyth Allegra, MD 11/22/22 [phone]

## 2022-11-22 NOTE — Progress Notes (Signed)
Per Bernadene Bell, ins Josem Kaufmann is still pending.  Viliami Bracco Tarpley-Carter, MSW, LCSW-A Pronouns:  She/Her/Hers [facility] Health[facility]itions of Care Clinical Social Worker Direct Number:  [phone] [email or web address]

## 2022-11-23 NOTE — ED Notes (Signed)
Provided pt peri care.

## 2022-11-23 NOTE — ED Provider Notes (Signed)
Emergency Medicine Observation Re-evaluation Note  [name] is a 67 y.o. male, seen on rounds today.  Pt initially presented to the ED for complaints of Failure To Thrive Currently, the patient is resting  Physical Exam  BP 96/63 (BP Location: Left Arm)   Pulse 62   Temp 98 F (36.7 C) (Axillary)   Resp 16   SpO2 100%  Physical Exam General: NAD Cardiac: Regular HR Lungs: No respiratory distress Psych: Stable  ED Course / MDM  EKG:   I have reviewed the labs performed to date as well as medications administered while in observation.  Recent changes in the last 24 hours include no sig changes  Plan  Current plan is for SNF placement, SW consultation appreciated    Wyvonnia Dusky, MD 11/23/22 [phone]

## 2022-11-23 NOTE — ED Notes (Signed)
Tech informed nurse that patient had scratch/abrasion to right buttock area. Area is red but not open or draining, RN applied padded gauze to area.

## 2022-11-23 NOTE — Progress Notes (Signed)
Mobility Specialist - Progress Note   11/23/22 1533  Mobility  Activity Ambulated with assistance in hallway  Level of Assistance Contact guard assist, steadying assist  Assistive Device Front wheel walker  Distance Ambulated (ft) 70 ft  Activity Response Tolerated well  Mobility Referral Yes  $Mobility charge 1 Mobility   Pt received in bed and agreeable to mobility. Pt found soaked in urine. Was able to get pt cleaned up w/ help of NT. Pt was MinA for bed mobility as well as sit-to-stand. During ambulation pt was contact. Pt required verbal cues for walker mobility & general direction. No complaints during session. Pt to bed after session with all needs met.    [facility]

## 2022-11-23 NOTE — Progress Notes (Addendum)
Auth still pending.   Addend @ 11:47 AM Provided pt's son, Jiles Prows, an update. [name] stated he'd filed for guardianship at the clerk of court and they are wanting to do an in person visit with the pt sometime next week. This CSW informed Jiles Prows that if Insurance is approved over the weekend pt will d/c to [facility] on Monday when bed will be available.[name] verbalized understanding and stated he would update the staff who is completing the visit whether the pt is still in the ED or transitioned to the SNF.

## 2022-11-23 NOTE — Progress Notes (Signed)
[name] is still pending at this time. Patient will d/c to [facility] on Monday contingent upon Insurance approval.

## 2022-11-23 NOTE — ED Notes (Signed)
Provided peri care to pt. Applied condom cath to prevent incontinent breakdowns.

## 2022-11-24 NOTE — Progress Notes (Signed)
Mobility Specialist - Progress Note   11/24/22 1437  Mobility  Activity Ambulated with assistance in hallway  Level of Assistance Contact guard assist, steadying assist  Assistive Device Front wheel walker  Distance Ambulated (ft) 70 ft  Activity Response Tolerated well  Mobility Referral Yes  $Mobility charge 1 Mobility   Pt received in bed and agreeable to mobility. Pt found soiled. Pt family was able to assist w/ cleaning pt up. Pt was MinA from sit-to-stand & contact during ambulation. No complaints during session. Pt to EOB for meal after session with all needs met & son in room.    [facility]

## 2022-11-24 NOTE — ED Notes (Signed)
Pt is asleep with no signs of distress or sleep disturbance.

## 2022-11-24 NOTE — Progress Notes (Signed)
TOC CSW has received ins auth info as follows:  Plan Auth ID:  [id number]  Auth ID:  [id number]  Next Review Date is:  11/26/2022  Elivia Robotham Tarpley-Carter, MSW, LCSW-A Pronouns:  She/Her/Hers [facility] Health[facility] of Care Clinical Social Worker Direct Number:  [phone] [email or web address]

## 2022-11-24 NOTE — Progress Notes (Signed)
Pts ins [name] is still pending at this time.  [name], MSW, LCSW-A Pronouns:  She/Her/Hers [facility] HealthTransitions of Care Clinical Social Worker Direct Number:  [phone] [name]

## 2022-11-24 NOTE — ED Provider Notes (Signed)
Emergency Medicine Observation Re-evaluation Note  [name] is a 67 y.o. male, seen on rounds today.  Pt initially presented to the ED for complaints of Failure To Thrive Currently, the patient is resting  Physical Exam  BP 135/89 (BP Location: Left Arm)   Pulse (!) 59   Temp 98 F (36.7 C) (Axillary)   Resp 17   SpO2 100%  Physical Exam General: Eating breakfast, calm Cardiac: Regular HR Lungs: No respiratory distress Psych: Stable  ED Course / MDM  EKG:   I have reviewed the labs performed to date as well as medications administered while in observation.  Recent changes in the last 24 hours include no sig changes  Plan  Current plan is to DC to [facility] on Monday (tomorrow) contingent upon insurance approval    Audley Hose, MD 11/24/22 [phone]

## 2022-11-24 NOTE — ED Notes (Signed)
Sleeping intermittently no signs of distress or disturbance . Respirations are easy with a rate of 16 BPM skin color is appropriate for pt ethnicity

## 2022-11-24 NOTE — ED Notes (Signed)
Pt is currently sleeping with no disturbed or broken sleep patterns noted

## 2022-11-24 NOTE — Final Progress Note (Signed)
Pt can dc to [facility] on Monday (11/25/2022).  Dynastee Brummell Tarpley-Carter, MSW, LCSW-A Pronouns:  She/Her/Hers [facility] Health[facility] of Care Clinical Social Worker Direct Number:  [phone] [email or web address]

## 2022-11-25 DIAGNOSIS — E785 Hyperlipidemia, unspecified: Secondary | ICD-10-CM | POA: Diagnosis not present

## 2022-11-25 DIAGNOSIS — Z7185 Encounter for immunization safety counseling: Secondary | ICD-10-CM | POA: Diagnosis not present

## 2022-11-25 DIAGNOSIS — R2689 Other abnormalities of gait and mobility: Secondary | ICD-10-CM | POA: Diagnosis not present

## 2022-11-25 DIAGNOSIS — I69319 Unspecified symptoms and signs involving cognitive functions following cerebral infarction: Secondary | ICD-10-CM | POA: Diagnosis not present

## 2022-11-25 DIAGNOSIS — Z8673 Personal history of transient ischemic attack (TIA), and cerebral infarction without residual deficits: Secondary | ICD-10-CM | POA: Diagnosis not present

## 2022-11-25 DIAGNOSIS — I10 Essential (primary) hypertension: Secondary | ICD-10-CM | POA: Diagnosis not present

## 2022-11-25 DIAGNOSIS — E46 Unspecified protein-calorie malnutrition: Secondary | ICD-10-CM | POA: Diagnosis not present

## 2022-11-25 DIAGNOSIS — R1311 Dysphagia, oral phase: Secondary | ICD-10-CM | POA: Diagnosis not present

## 2022-11-25 DIAGNOSIS — S91001A Unspecified open wound, right ankle, initial encounter: Secondary | ICD-10-CM | POA: Diagnosis not present

## 2022-11-25 DIAGNOSIS — R627 Adult failure to thrive: Secondary | ICD-10-CM | POA: Diagnosis not present

## 2022-11-25 DIAGNOSIS — R531 Weakness: Secondary | ICD-10-CM | POA: Diagnosis not present

## 2022-11-25 DIAGNOSIS — M6258 Muscle wasting and atrophy, not elsewhere classified, other site: Secondary | ICD-10-CM | POA: Diagnosis not present

## 2022-11-25 DIAGNOSIS — Z681 Body mass index (BMI) 19 or less, adult: Secondary | ICD-10-CM | POA: Diagnosis not present

## 2022-11-25 DIAGNOSIS — Z7401 Bed confinement status: Secondary | ICD-10-CM | POA: Diagnosis not present

## 2022-11-25 DIAGNOSIS — I69322 Dysarthria following cerebral infarction: Secondary | ICD-10-CM | POA: Diagnosis not present

## 2022-11-25 DIAGNOSIS — M6281 Muscle weakness (generalized): Secondary | ICD-10-CM | POA: Diagnosis not present

## 2022-11-25 DIAGNOSIS — L89212 Pressure ulcer of right hip, stage 2: Secondary | ICD-10-CM | POA: Diagnosis not present

## 2022-11-25 DIAGNOSIS — Z23 Encounter for immunization: Secondary | ICD-10-CM | POA: Diagnosis not present

## 2022-11-25 DIAGNOSIS — R6889 Other general symptoms and signs: Secondary | ICD-10-CM | POA: Diagnosis not present

## 2022-11-25 DIAGNOSIS — M6250 Muscle wasting and atrophy, not elsewhere classified, unspecified site: Secondary | ICD-10-CM | POA: Diagnosis not present

## 2022-11-25 DIAGNOSIS — Z20822 Contact with and (suspected) exposure to covid-19: Secondary | ICD-10-CM | POA: Diagnosis not present

## 2022-11-25 DIAGNOSIS — Z743 Need for continuous supervision: Secondary | ICD-10-CM | POA: Diagnosis not present

## 2022-11-25 DIAGNOSIS — R5381 Other malaise: Secondary | ICD-10-CM | POA: Diagnosis not present

## 2022-11-25 DIAGNOSIS — L89302 Pressure ulcer of unspecified buttock, stage 2: Secondary | ICD-10-CM | POA: Diagnosis not present

## 2022-11-25 DIAGNOSIS — Z7982 Long term (current) use of aspirin: Secondary | ICD-10-CM | POA: Diagnosis not present

## 2022-11-25 DIAGNOSIS — R269 Unspecified abnormalities of gait and mobility: Secondary | ICD-10-CM | POA: Diagnosis not present

## 2022-11-25 NOTE — ED Provider Notes (Signed)
Pt has been awaiting SNF placement - it appears accepted to [facility] today.  Pt resting, easily aroused, no distress, vitals normal.  Pt currently appears stable for d/c to SNF.     Lajean Saver, MD 11/25/22 [phone]

## 2022-11-25 NOTE — Discharge Instructions (Signed)
It was our pleasure to provide your ER care today - we hope that you feel better.  Drink adequate/fluids, stay well hydrated. Get adequate nutrition.   Follow up with primary care doctor in 1-2 weeks.  Return to ER if worse, new symptoms, fevers, chest pain, trouble breathing, or other emergency concern.

## 2022-11-25 NOTE — Progress Notes (Addendum)
This CSW notified Star, admissions rep at [facility] at 8:04 AM that pt's Auth has been approved. Awaiting response. TOC following for room assignment/report #. Will notify EDP and RN via secure chat when information is available.   Addend @ 10:14 AM Notified pt's son of d/c plan. Son in agreement. Contacted [facility] for transport.

## 2022-11-26 DIAGNOSIS — L89212 Pressure ulcer of right hip, stage 2: Secondary | ICD-10-CM | POA: Diagnosis not present

## 2022-11-26 DIAGNOSIS — S91001A Unspecified open wound, right ankle, initial encounter: Secondary | ICD-10-CM | POA: Diagnosis not present

## 2022-11-26 LAB — CULTURE, BLOOD (ROUTINE X 2)
Culture: NO GROWTH
Culture: NO GROWTH

## 2022-11-27 DIAGNOSIS — R5381 Other malaise: Secondary | ICD-10-CM | POA: Diagnosis not present

## 2022-11-27 DIAGNOSIS — I69322 Dysarthria following cerebral infarction: Secondary | ICD-10-CM | POA: Diagnosis not present

## 2022-11-27 DIAGNOSIS — I69319 Unspecified symptoms and signs involving cognitive functions following cerebral infarction: Secondary | ICD-10-CM | POA: Diagnosis not present

## 2022-11-27 DIAGNOSIS — E785 Hyperlipidemia, unspecified: Secondary | ICD-10-CM | POA: Diagnosis not present

## 2022-11-27 DIAGNOSIS — E46 Unspecified protein-calorie malnutrition: Secondary | ICD-10-CM | POA: Diagnosis not present

## 2022-11-27 DIAGNOSIS — R627 Adult failure to thrive: Secondary | ICD-10-CM | POA: Diagnosis not present

## 2022-11-27 DIAGNOSIS — Z8673 Personal history of transient ischemic attack (TIA), and cerebral infarction without residual deficits: Secondary | ICD-10-CM | POA: Diagnosis not present

## 2022-11-27 DIAGNOSIS — M6258 Muscle wasting and atrophy, not elsewhere classified, other site: Secondary | ICD-10-CM | POA: Diagnosis not present

## 2022-11-27 DIAGNOSIS — L89302 Pressure ulcer of unspecified buttock, stage 2: Secondary | ICD-10-CM | POA: Diagnosis not present

## 2022-11-27 DIAGNOSIS — Z681 Body mass index (BMI) 19 or less, adult: Secondary | ICD-10-CM | POA: Diagnosis not present

## 2022-11-27 DIAGNOSIS — I10 Essential (primary) hypertension: Secondary | ICD-10-CM | POA: Diagnosis not present

## 2022-11-27 DIAGNOSIS — M6281 Muscle weakness (generalized): Secondary | ICD-10-CM | POA: Diagnosis not present

## 2022-11-27 DIAGNOSIS — R269 Unspecified abnormalities of gait and mobility: Secondary | ICD-10-CM | POA: Diagnosis not present

## 2022-11-27 DIAGNOSIS — M6250 Muscle wasting and atrophy, not elsewhere classified, unspecified site: Secondary | ICD-10-CM | POA: Diagnosis not present

## 2022-12-02 DIAGNOSIS — M6250 Muscle wasting and atrophy, not elsewhere classified, unspecified site: Secondary | ICD-10-CM | POA: Diagnosis not present

## 2022-12-02 DIAGNOSIS — E46 Unspecified protein-calorie malnutrition: Secondary | ICD-10-CM | POA: Diagnosis not present

## 2022-12-02 DIAGNOSIS — M6281 Muscle weakness (generalized): Secondary | ICD-10-CM | POA: Diagnosis not present

## 2022-12-02 DIAGNOSIS — Z8673 Personal history of transient ischemic attack (TIA), and cerebral infarction without residual deficits: Secondary | ICD-10-CM | POA: Diagnosis not present

## 2022-12-02 DIAGNOSIS — I10 Essential (primary) hypertension: Secondary | ICD-10-CM | POA: Diagnosis not present

## 2022-12-02 DIAGNOSIS — R627 Adult failure to thrive: Secondary | ICD-10-CM | POA: Diagnosis not present

## 2022-12-03 DIAGNOSIS — S91001A Unspecified open wound, right ankle, initial encounter: Secondary | ICD-10-CM | POA: Diagnosis not present

## 2022-12-03 DIAGNOSIS — L89212 Pressure ulcer of right hip, stage 2: Secondary | ICD-10-CM | POA: Diagnosis not present

## 2022-12-04 DIAGNOSIS — E46 Unspecified protein-calorie malnutrition: Secondary | ICD-10-CM | POA: Diagnosis not present

## 2022-12-04 DIAGNOSIS — M6250 Muscle wasting and atrophy, not elsewhere classified, unspecified site: Secondary | ICD-10-CM | POA: Diagnosis not present

## 2022-12-04 DIAGNOSIS — M6281 Muscle weakness (generalized): Secondary | ICD-10-CM | POA: Diagnosis not present

## 2022-12-04 DIAGNOSIS — R627 Adult failure to thrive: Secondary | ICD-10-CM | POA: Diagnosis not present

## 2022-12-04 DIAGNOSIS — Z8673 Personal history of transient ischemic attack (TIA), and cerebral infarction without residual deficits: Secondary | ICD-10-CM | POA: Diagnosis not present

## 2022-12-09 DIAGNOSIS — M6250 Muscle wasting and atrophy, not elsewhere classified, unspecified site: Secondary | ICD-10-CM | POA: Diagnosis not present

## 2022-12-09 DIAGNOSIS — R627 Adult failure to thrive: Secondary | ICD-10-CM | POA: Diagnosis not present

## 2022-12-09 DIAGNOSIS — Z8673 Personal history of transient ischemic attack (TIA), and cerebral infarction without residual deficits: Secondary | ICD-10-CM | POA: Diagnosis not present

## 2022-12-09 DIAGNOSIS — E46 Unspecified protein-calorie malnutrition: Secondary | ICD-10-CM | POA: Diagnosis not present

## 2022-12-09 DIAGNOSIS — M6281 Muscle weakness (generalized): Secondary | ICD-10-CM | POA: Diagnosis not present

## 2022-12-11 DIAGNOSIS — E46 Unspecified protein-calorie malnutrition: Secondary | ICD-10-CM | POA: Diagnosis not present

## 2022-12-11 DIAGNOSIS — M6281 Muscle weakness (generalized): Secondary | ICD-10-CM | POA: Diagnosis not present

## 2022-12-11 DIAGNOSIS — Z8673 Personal history of transient ischemic attack (TIA), and cerebral infarction without residual deficits: Secondary | ICD-10-CM | POA: Diagnosis not present

## 2022-12-11 DIAGNOSIS — M6250 Muscle wasting and atrophy, not elsewhere classified, unspecified site: Secondary | ICD-10-CM | POA: Diagnosis not present

## 2022-12-11 DIAGNOSIS — R627 Adult failure to thrive: Secondary | ICD-10-CM | POA: Diagnosis not present

## 2022-12-13 DIAGNOSIS — M6281 Muscle weakness (generalized): Secondary | ICD-10-CM | POA: Diagnosis not present

## 2022-12-13 DIAGNOSIS — R1311 Dysphagia, oral phase: Secondary | ICD-10-CM | POA: Diagnosis not present

## 2022-12-13 DIAGNOSIS — M6258 Muscle wasting and atrophy, not elsewhere classified, other site: Secondary | ICD-10-CM | POA: Diagnosis not present

## 2022-12-13 DIAGNOSIS — R2689 Other abnormalities of gait and mobility: Secondary | ICD-10-CM | POA: Diagnosis not present

## 2022-12-16 DIAGNOSIS — M6281 Muscle weakness (generalized): Secondary | ICD-10-CM | POA: Diagnosis not present

## 2022-12-16 DIAGNOSIS — E46 Unspecified protein-calorie malnutrition: Secondary | ICD-10-CM | POA: Diagnosis not present

## 2022-12-16 DIAGNOSIS — M6250 Muscle wasting and atrophy, not elsewhere classified, unspecified site: Secondary | ICD-10-CM | POA: Diagnosis not present

## 2022-12-16 DIAGNOSIS — M6258 Muscle wasting and atrophy, not elsewhere classified, other site: Secondary | ICD-10-CM | POA: Diagnosis not present

## 2022-12-16 DIAGNOSIS — R2689 Other abnormalities of gait and mobility: Secondary | ICD-10-CM | POA: Diagnosis not present

## 2022-12-16 DIAGNOSIS — Z9181 History of falling: Secondary | ICD-10-CM | POA: Diagnosis not present

## 2022-12-16 DIAGNOSIS — Z8673 Personal history of transient ischemic attack (TIA), and cerebral infarction without residual deficits: Secondary | ICD-10-CM | POA: Diagnosis not present

## 2022-12-16 DIAGNOSIS — R1311 Dysphagia, oral phase: Secondary | ICD-10-CM | POA: Diagnosis not present

## 2022-12-16 DIAGNOSIS — R278 Other lack of coordination: Secondary | ICD-10-CM | POA: Diagnosis not present

## 2022-12-16 DIAGNOSIS — R627 Adult failure to thrive: Secondary | ICD-10-CM | POA: Diagnosis not present

## 2022-12-16 DIAGNOSIS — R2681 Unsteadiness on feet: Secondary | ICD-10-CM | POA: Diagnosis not present

## 2022-12-17 DIAGNOSIS — R1311 Dysphagia, oral phase: Secondary | ICD-10-CM | POA: Diagnosis not present

## 2022-12-17 DIAGNOSIS — Z9181 History of falling: Secondary | ICD-10-CM | POA: Diagnosis not present

## 2022-12-17 DIAGNOSIS — R2689 Other abnormalities of gait and mobility: Secondary | ICD-10-CM | POA: Diagnosis not present

## 2022-12-17 DIAGNOSIS — R278 Other lack of coordination: Secondary | ICD-10-CM | POA: Diagnosis not present

## 2022-12-17 DIAGNOSIS — M6258 Muscle wasting and atrophy, not elsewhere classified, other site: Secondary | ICD-10-CM | POA: Diagnosis not present

## 2022-12-17 DIAGNOSIS — M6281 Muscle weakness (generalized): Secondary | ICD-10-CM | POA: Diagnosis not present

## 2022-12-17 DIAGNOSIS — R2681 Unsteadiness on feet: Secondary | ICD-10-CM | POA: Diagnosis not present

## 2022-12-18 DIAGNOSIS — R2681 Unsteadiness on feet: Secondary | ICD-10-CM | POA: Diagnosis not present

## 2022-12-18 DIAGNOSIS — R278 Other lack of coordination: Secondary | ICD-10-CM | POA: Diagnosis not present

## 2022-12-18 DIAGNOSIS — M6258 Muscle wasting and atrophy, not elsewhere classified, other site: Secondary | ICD-10-CM | POA: Diagnosis not present

## 2022-12-18 DIAGNOSIS — R2689 Other abnormalities of gait and mobility: Secondary | ICD-10-CM | POA: Diagnosis not present

## 2022-12-18 DIAGNOSIS — Z9181 History of falling: Secondary | ICD-10-CM | POA: Diagnosis not present

## 2022-12-18 DIAGNOSIS — M6281 Muscle weakness (generalized): Secondary | ICD-10-CM | POA: Diagnosis not present

## 2022-12-18 DIAGNOSIS — R1311 Dysphagia, oral phase: Secondary | ICD-10-CM | POA: Diagnosis not present

## 2022-12-25 DIAGNOSIS — Z9181 History of falling: Secondary | ICD-10-CM | POA: Diagnosis not present

## 2022-12-25 DIAGNOSIS — M6281 Muscle weakness (generalized): Secondary | ICD-10-CM | POA: Diagnosis not present

## 2022-12-25 DIAGNOSIS — R278 Other lack of coordination: Secondary | ICD-10-CM | POA: Diagnosis not present

## 2022-12-25 DIAGNOSIS — R2689 Other abnormalities of gait and mobility: Secondary | ICD-10-CM | POA: Diagnosis not present

## 2022-12-25 DIAGNOSIS — M6258 Muscle wasting and atrophy, not elsewhere classified, other site: Secondary | ICD-10-CM | POA: Diagnosis not present

## 2022-12-25 DIAGNOSIS — R2681 Unsteadiness on feet: Secondary | ICD-10-CM | POA: Diagnosis not present

## 2022-12-25 DIAGNOSIS — R1311 Dysphagia, oral phase: Secondary | ICD-10-CM | POA: Diagnosis not present

## 2022-12-26 DIAGNOSIS — M6281 Muscle weakness (generalized): Secondary | ICD-10-CM | POA: Diagnosis not present

## 2022-12-26 DIAGNOSIS — Z9181 History of falling: Secondary | ICD-10-CM | POA: Diagnosis not present

## 2022-12-26 DIAGNOSIS — R2689 Other abnormalities of gait and mobility: Secondary | ICD-10-CM | POA: Diagnosis not present

## 2022-12-26 DIAGNOSIS — R2681 Unsteadiness on feet: Secondary | ICD-10-CM | POA: Diagnosis not present

## 2022-12-26 DIAGNOSIS — R278 Other lack of coordination: Secondary | ICD-10-CM | POA: Diagnosis not present

## 2022-12-26 DIAGNOSIS — R1311 Dysphagia, oral phase: Secondary | ICD-10-CM | POA: Diagnosis not present

## 2022-12-26 DIAGNOSIS — M6258 Muscle wasting and atrophy, not elsewhere classified, other site: Secondary | ICD-10-CM | POA: Diagnosis not present

## 2022-12-27 DIAGNOSIS — M6281 Muscle weakness (generalized): Secondary | ICD-10-CM | POA: Diagnosis not present

## 2022-12-27 DIAGNOSIS — Z9181 History of falling: Secondary | ICD-10-CM | POA: Diagnosis not present

## 2022-12-27 DIAGNOSIS — R1311 Dysphagia, oral phase: Secondary | ICD-10-CM | POA: Diagnosis not present

## 2022-12-27 DIAGNOSIS — M6258 Muscle wasting and atrophy, not elsewhere classified, other site: Secondary | ICD-10-CM | POA: Diagnosis not present

## 2022-12-27 DIAGNOSIS — R2681 Unsteadiness on feet: Secondary | ICD-10-CM | POA: Diagnosis not present

## 2022-12-27 DIAGNOSIS — R278 Other lack of coordination: Secondary | ICD-10-CM | POA: Diagnosis not present

## 2022-12-27 DIAGNOSIS — R2689 Other abnormalities of gait and mobility: Secondary | ICD-10-CM | POA: Diagnosis not present

## 2022-12-30 ENCOUNTER — Ambulatory Visit: Payer: Medicare Other | Admitting: Podiatry

## 2022-12-30 DIAGNOSIS — R1311 Dysphagia, oral phase: Secondary | ICD-10-CM | POA: Diagnosis not present

## 2022-12-30 DIAGNOSIS — R2689 Other abnormalities of gait and mobility: Secondary | ICD-10-CM | POA: Diagnosis not present

## 2022-12-30 DIAGNOSIS — Z9181 History of falling: Secondary | ICD-10-CM | POA: Diagnosis not present

## 2022-12-30 DIAGNOSIS — R2681 Unsteadiness on feet: Secondary | ICD-10-CM | POA: Diagnosis not present

## 2022-12-30 DIAGNOSIS — M6281 Muscle weakness (generalized): Secondary | ICD-10-CM | POA: Diagnosis not present

## 2022-12-30 DIAGNOSIS — M6258 Muscle wasting and atrophy, not elsewhere classified, other site: Secondary | ICD-10-CM | POA: Diagnosis not present

## 2022-12-30 DIAGNOSIS — R278 Other lack of coordination: Secondary | ICD-10-CM | POA: Diagnosis not present

## 2022-12-31 DIAGNOSIS — M6281 Muscle weakness (generalized): Secondary | ICD-10-CM | POA: Diagnosis not present

## 2022-12-31 DIAGNOSIS — M6258 Muscle wasting and atrophy, not elsewhere classified, other site: Secondary | ICD-10-CM | POA: Diagnosis not present

## 2022-12-31 DIAGNOSIS — Z9181 History of falling: Secondary | ICD-10-CM | POA: Diagnosis not present

## 2022-12-31 DIAGNOSIS — R1311 Dysphagia, oral phase: Secondary | ICD-10-CM | POA: Diagnosis not present

## 2022-12-31 DIAGNOSIS — R278 Other lack of coordination: Secondary | ICD-10-CM | POA: Diagnosis not present

## 2022-12-31 DIAGNOSIS — R2681 Unsteadiness on feet: Secondary | ICD-10-CM | POA: Diagnosis not present

## 2022-12-31 DIAGNOSIS — R2689 Other abnormalities of gait and mobility: Secondary | ICD-10-CM | POA: Diagnosis not present

## 2023-01-01 DIAGNOSIS — R278 Other lack of coordination: Secondary | ICD-10-CM | POA: Diagnosis not present

## 2023-01-01 DIAGNOSIS — R1311 Dysphagia, oral phase: Secondary | ICD-10-CM | POA: Diagnosis not present

## 2023-01-01 DIAGNOSIS — M6281 Muscle weakness (generalized): Secondary | ICD-10-CM | POA: Diagnosis not present

## 2023-01-01 DIAGNOSIS — M6258 Muscle wasting and atrophy, not elsewhere classified, other site: Secondary | ICD-10-CM | POA: Diagnosis not present

## 2023-01-01 DIAGNOSIS — Z9181 History of falling: Secondary | ICD-10-CM | POA: Diagnosis not present

## 2023-01-01 DIAGNOSIS — R2681 Unsteadiness on feet: Secondary | ICD-10-CM | POA: Diagnosis not present

## 2023-01-01 DIAGNOSIS — R2689 Other abnormalities of gait and mobility: Secondary | ICD-10-CM | POA: Diagnosis not present

## 2023-01-02 DIAGNOSIS — Z9181 History of falling: Secondary | ICD-10-CM | POA: Diagnosis not present

## 2023-01-02 DIAGNOSIS — R2681 Unsteadiness on feet: Secondary | ICD-10-CM | POA: Diagnosis not present

## 2023-01-02 DIAGNOSIS — R278 Other lack of coordination: Secondary | ICD-10-CM | POA: Diagnosis not present

## 2023-01-02 DIAGNOSIS — R1311 Dysphagia, oral phase: Secondary | ICD-10-CM | POA: Diagnosis not present

## 2023-01-02 DIAGNOSIS — M6258 Muscle wasting and atrophy, not elsewhere classified, other site: Secondary | ICD-10-CM | POA: Diagnosis not present

## 2023-01-02 DIAGNOSIS — M6281 Muscle weakness (generalized): Secondary | ICD-10-CM | POA: Diagnosis not present

## 2023-01-02 DIAGNOSIS — R2689 Other abnormalities of gait and mobility: Secondary | ICD-10-CM | POA: Diagnosis not present

## 2023-01-03 DIAGNOSIS — R2689 Other abnormalities of gait and mobility: Secondary | ICD-10-CM | POA: Diagnosis not present

## 2023-01-03 DIAGNOSIS — M6258 Muscle wasting and atrophy, not elsewhere classified, other site: Secondary | ICD-10-CM | POA: Diagnosis not present

## 2023-01-03 DIAGNOSIS — R2681 Unsteadiness on feet: Secondary | ICD-10-CM | POA: Diagnosis not present

## 2023-01-03 DIAGNOSIS — R1311 Dysphagia, oral phase: Secondary | ICD-10-CM | POA: Diagnosis not present

## 2023-01-03 DIAGNOSIS — R278 Other lack of coordination: Secondary | ICD-10-CM | POA: Diagnosis not present

## 2023-01-03 DIAGNOSIS — Z9181 History of falling: Secondary | ICD-10-CM | POA: Diagnosis not present

## 2023-01-03 DIAGNOSIS — M6281 Muscle weakness (generalized): Secondary | ICD-10-CM | POA: Diagnosis not present

## 2023-01-06 DIAGNOSIS — R278 Other lack of coordination: Secondary | ICD-10-CM | POA: Diagnosis not present

## 2023-01-06 DIAGNOSIS — Z8673 Personal history of transient ischemic attack (TIA), and cerebral infarction without residual deficits: Secondary | ICD-10-CM | POA: Diagnosis not present

## 2023-01-06 DIAGNOSIS — R2681 Unsteadiness on feet: Secondary | ICD-10-CM | POA: Diagnosis not present

## 2023-01-06 DIAGNOSIS — M6258 Muscle wasting and atrophy, not elsewhere classified, other site: Secondary | ICD-10-CM | POA: Diagnosis not present

## 2023-01-06 DIAGNOSIS — Z9181 History of falling: Secondary | ICD-10-CM | POA: Diagnosis not present

## 2023-01-06 DIAGNOSIS — R1311 Dysphagia, oral phase: Secondary | ICD-10-CM | POA: Diagnosis not present

## 2023-01-06 DIAGNOSIS — R2689 Other abnormalities of gait and mobility: Secondary | ICD-10-CM | POA: Diagnosis not present

## 2023-01-06 DIAGNOSIS — M6281 Muscle weakness (generalized): Secondary | ICD-10-CM | POA: Diagnosis not present

## 2023-01-06 DIAGNOSIS — M6250 Muscle wasting and atrophy, not elsewhere classified, unspecified site: Secondary | ICD-10-CM | POA: Diagnosis not present

## 2023-01-06 DIAGNOSIS — E46 Unspecified protein-calorie malnutrition: Secondary | ICD-10-CM | POA: Diagnosis not present

## 2023-01-06 DIAGNOSIS — R627 Adult failure to thrive: Secondary | ICD-10-CM | POA: Diagnosis not present

## 2023-01-07 DIAGNOSIS — M6258 Muscle wasting and atrophy, not elsewhere classified, other site: Secondary | ICD-10-CM | POA: Diagnosis not present

## 2023-01-07 DIAGNOSIS — M6281 Muscle weakness (generalized): Secondary | ICD-10-CM | POA: Diagnosis not present

## 2023-01-07 DIAGNOSIS — R2689 Other abnormalities of gait and mobility: Secondary | ICD-10-CM | POA: Diagnosis not present

## 2023-01-07 DIAGNOSIS — R2681 Unsteadiness on feet: Secondary | ICD-10-CM | POA: Diagnosis not present

## 2023-01-07 DIAGNOSIS — R1311 Dysphagia, oral phase: Secondary | ICD-10-CM | POA: Diagnosis not present

## 2023-01-07 DIAGNOSIS — R278 Other lack of coordination: Secondary | ICD-10-CM | POA: Diagnosis not present

## 2023-01-07 DIAGNOSIS — Z9181 History of falling: Secondary | ICD-10-CM | POA: Diagnosis not present

## 2023-01-08 DIAGNOSIS — Z8673 Personal history of transient ischemic attack (TIA), and cerebral infarction without residual deficits: Secondary | ICD-10-CM | POA: Diagnosis not present

## 2023-01-08 DIAGNOSIS — M6281 Muscle weakness (generalized): Secondary | ICD-10-CM | POA: Diagnosis not present

## 2023-01-08 DIAGNOSIS — R627 Adult failure to thrive: Secondary | ICD-10-CM | POA: Diagnosis not present

## 2023-01-08 DIAGNOSIS — Z7189 Other specified counseling: Secondary | ICD-10-CM | POA: Diagnosis not present

## 2023-01-08 DIAGNOSIS — R1311 Dysphagia, oral phase: Secondary | ICD-10-CM | POA: Diagnosis not present

## 2023-01-08 DIAGNOSIS — I10 Essential (primary) hypertension: Secondary | ICD-10-CM | POA: Diagnosis not present

## 2023-01-08 DIAGNOSIS — R2689 Other abnormalities of gait and mobility: Secondary | ICD-10-CM | POA: Diagnosis not present

## 2023-01-08 DIAGNOSIS — E785 Hyperlipidemia, unspecified: Secondary | ICD-10-CM | POA: Diagnosis not present

## 2023-01-08 DIAGNOSIS — I69322 Dysarthria following cerebral infarction: Secondary | ICD-10-CM | POA: Diagnosis not present

## 2023-01-08 DIAGNOSIS — R2681 Unsteadiness on feet: Secondary | ICD-10-CM | POA: Diagnosis not present

## 2023-01-08 DIAGNOSIS — Z9181 History of falling: Secondary | ICD-10-CM | POA: Diagnosis not present

## 2023-01-08 DIAGNOSIS — R278 Other lack of coordination: Secondary | ICD-10-CM | POA: Diagnosis not present

## 2023-01-08 DIAGNOSIS — M6258 Muscle wasting and atrophy, not elsewhere classified, other site: Secondary | ICD-10-CM | POA: Diagnosis not present

## 2023-01-09 DIAGNOSIS — R2681 Unsteadiness on feet: Secondary | ICD-10-CM | POA: Diagnosis not present

## 2023-01-09 DIAGNOSIS — L900 Lichen sclerosus et atrophicus: Secondary | ICD-10-CM | POA: Diagnosis not present

## 2023-01-09 DIAGNOSIS — R1311 Dysphagia, oral phase: Secondary | ICD-10-CM | POA: Diagnosis not present

## 2023-01-09 DIAGNOSIS — M6281 Muscle weakness (generalized): Secondary | ICD-10-CM | POA: Diagnosis not present

## 2023-01-09 DIAGNOSIS — R278 Other lack of coordination: Secondary | ICD-10-CM | POA: Diagnosis not present

## 2023-01-09 DIAGNOSIS — Z9181 History of falling: Secondary | ICD-10-CM | POA: Diagnosis not present

## 2023-01-09 DIAGNOSIS — R2689 Other abnormalities of gait and mobility: Secondary | ICD-10-CM | POA: Diagnosis not present

## 2023-01-09 DIAGNOSIS — M6258 Muscle wasting and atrophy, not elsewhere classified, other site: Secondary | ICD-10-CM | POA: Diagnosis not present

## 2023-01-10 DIAGNOSIS — R1311 Dysphagia, oral phase: Secondary | ICD-10-CM | POA: Diagnosis not present

## 2023-01-10 DIAGNOSIS — Z9181 History of falling: Secondary | ICD-10-CM | POA: Diagnosis not present

## 2023-01-10 DIAGNOSIS — M6258 Muscle wasting and atrophy, not elsewhere classified, other site: Secondary | ICD-10-CM | POA: Diagnosis not present

## 2023-01-10 DIAGNOSIS — R2689 Other abnormalities of gait and mobility: Secondary | ICD-10-CM | POA: Diagnosis not present

## 2023-01-10 DIAGNOSIS — M6281 Muscle weakness (generalized): Secondary | ICD-10-CM | POA: Diagnosis not present

## 2023-01-10 DIAGNOSIS — R278 Other lack of coordination: Secondary | ICD-10-CM | POA: Diagnosis not present

## 2023-01-10 DIAGNOSIS — R2681 Unsteadiness on feet: Secondary | ICD-10-CM | POA: Diagnosis not present

## 2023-01-13 DIAGNOSIS — R2681 Unsteadiness on feet: Secondary | ICD-10-CM | POA: Diagnosis not present

## 2023-01-13 DIAGNOSIS — R2689 Other abnormalities of gait and mobility: Secondary | ICD-10-CM | POA: Diagnosis not present

## 2023-01-13 DIAGNOSIS — Z9181 History of falling: Secondary | ICD-10-CM | POA: Diagnosis not present

## 2023-01-13 DIAGNOSIS — M6258 Muscle wasting and atrophy, not elsewhere classified, other site: Secondary | ICD-10-CM | POA: Diagnosis not present

## 2023-01-13 DIAGNOSIS — R278 Other lack of coordination: Secondary | ICD-10-CM | POA: Diagnosis not present

## 2023-01-13 DIAGNOSIS — M6281 Muscle weakness (generalized): Secondary | ICD-10-CM | POA: Diagnosis not present

## 2023-01-13 DIAGNOSIS — R1311 Dysphagia, oral phase: Secondary | ICD-10-CM | POA: Diagnosis not present

## 2023-01-14 DIAGNOSIS — R1311 Dysphagia, oral phase: Secondary | ICD-10-CM | POA: Diagnosis not present

## 2023-01-14 DIAGNOSIS — M6258 Muscle wasting and atrophy, not elsewhere classified, other site: Secondary | ICD-10-CM | POA: Diagnosis not present

## 2023-01-14 DIAGNOSIS — M6281 Muscle weakness (generalized): Secondary | ICD-10-CM | POA: Diagnosis not present

## 2023-01-14 DIAGNOSIS — R2689 Other abnormalities of gait and mobility: Secondary | ICD-10-CM | POA: Diagnosis not present

## 2023-01-14 DIAGNOSIS — R2681 Unsteadiness on feet: Secondary | ICD-10-CM | POA: Diagnosis not present

## 2023-01-14 DIAGNOSIS — Z9181 History of falling: Secondary | ICD-10-CM | POA: Diagnosis not present

## 2023-01-14 DIAGNOSIS — R278 Other lack of coordination: Secondary | ICD-10-CM | POA: Diagnosis not present

## 2023-01-15 DIAGNOSIS — R2681 Unsteadiness on feet: Secondary | ICD-10-CM | POA: Diagnosis not present

## 2023-01-15 DIAGNOSIS — R1311 Dysphagia, oral phase: Secondary | ICD-10-CM | POA: Diagnosis not present

## 2023-01-15 DIAGNOSIS — Z7289 Other problems related to lifestyle: Secondary | ICD-10-CM | POA: Diagnosis not present

## 2023-01-15 DIAGNOSIS — R627 Adult failure to thrive: Secondary | ICD-10-CM | POA: Diagnosis not present

## 2023-01-15 DIAGNOSIS — I69322 Dysarthria following cerebral infarction: Secondary | ICD-10-CM | POA: Diagnosis not present

## 2023-01-15 DIAGNOSIS — R2689 Other abnormalities of gait and mobility: Secondary | ICD-10-CM | POA: Diagnosis not present

## 2023-01-15 DIAGNOSIS — M6281 Muscle weakness (generalized): Secondary | ICD-10-CM | POA: Diagnosis not present

## 2023-01-15 DIAGNOSIS — R609 Edema, unspecified: Secondary | ICD-10-CM | POA: Diagnosis not present

## 2023-01-15 DIAGNOSIS — R278 Other lack of coordination: Secondary | ICD-10-CM | POA: Diagnosis not present

## 2023-01-15 DIAGNOSIS — I69319 Unspecified symptoms and signs involving cognitive functions following cerebral infarction: Secondary | ICD-10-CM | POA: Diagnosis not present

## 2023-01-15 DIAGNOSIS — E785 Hyperlipidemia, unspecified: Secondary | ICD-10-CM | POA: Diagnosis not present

## 2023-01-15 DIAGNOSIS — Z9181 History of falling: Secondary | ICD-10-CM | POA: Diagnosis not present

## 2023-01-15 DIAGNOSIS — M6258 Muscle wasting and atrophy, not elsewhere classified, other site: Secondary | ICD-10-CM | POA: Diagnosis not present

## 2023-01-15 DIAGNOSIS — E46 Unspecified protein-calorie malnutrition: Secondary | ICD-10-CM | POA: Diagnosis not present

## 2023-01-15 DIAGNOSIS — I10 Essential (primary) hypertension: Secondary | ICD-10-CM | POA: Diagnosis not present

## 2023-01-16 DIAGNOSIS — M6258 Muscle wasting and atrophy, not elsewhere classified, other site: Secondary | ICD-10-CM | POA: Diagnosis not present

## 2023-01-16 DIAGNOSIS — R278 Other lack of coordination: Secondary | ICD-10-CM | POA: Diagnosis not present

## 2023-01-16 DIAGNOSIS — R1311 Dysphagia, oral phase: Secondary | ICD-10-CM | POA: Diagnosis not present

## 2023-01-16 DIAGNOSIS — R2681 Unsteadiness on feet: Secondary | ICD-10-CM | POA: Diagnosis not present

## 2023-01-16 DIAGNOSIS — M6281 Muscle weakness (generalized): Secondary | ICD-10-CM | POA: Diagnosis not present

## 2023-01-16 DIAGNOSIS — Z9181 History of falling: Secondary | ICD-10-CM | POA: Diagnosis not present

## 2023-01-16 DIAGNOSIS — R2689 Other abnormalities of gait and mobility: Secondary | ICD-10-CM | POA: Diagnosis not present

## 2023-01-17 DIAGNOSIS — M6258 Muscle wasting and atrophy, not elsewhere classified, other site: Secondary | ICD-10-CM | POA: Diagnosis not present

## 2023-01-17 DIAGNOSIS — R1311 Dysphagia, oral phase: Secondary | ICD-10-CM | POA: Diagnosis not present

## 2023-01-17 DIAGNOSIS — R2681 Unsteadiness on feet: Secondary | ICD-10-CM | POA: Diagnosis not present

## 2023-01-17 DIAGNOSIS — M6281 Muscle weakness (generalized): Secondary | ICD-10-CM | POA: Diagnosis not present

## 2023-01-17 DIAGNOSIS — R2689 Other abnormalities of gait and mobility: Secondary | ICD-10-CM | POA: Diagnosis not present

## 2023-01-17 DIAGNOSIS — R278 Other lack of coordination: Secondary | ICD-10-CM | POA: Diagnosis not present

## 2023-01-17 DIAGNOSIS — Z9181 History of falling: Secondary | ICD-10-CM | POA: Diagnosis not present

## 2023-01-20 DIAGNOSIS — R278 Other lack of coordination: Secondary | ICD-10-CM | POA: Diagnosis not present

## 2023-01-20 DIAGNOSIS — M6258 Muscle wasting and atrophy, not elsewhere classified, other site: Secondary | ICD-10-CM | POA: Diagnosis not present

## 2023-01-20 DIAGNOSIS — R2681 Unsteadiness on feet: Secondary | ICD-10-CM | POA: Diagnosis not present

## 2023-01-20 DIAGNOSIS — M6281 Muscle weakness (generalized): Secondary | ICD-10-CM | POA: Diagnosis not present

## 2023-01-20 DIAGNOSIS — R2689 Other abnormalities of gait and mobility: Secondary | ICD-10-CM | POA: Diagnosis not present

## 2023-01-20 DIAGNOSIS — R1311 Dysphagia, oral phase: Secondary | ICD-10-CM | POA: Diagnosis not present

## 2023-01-20 DIAGNOSIS — Z9181 History of falling: Secondary | ICD-10-CM | POA: Diagnosis not present

## 2023-01-21 DIAGNOSIS — R2689 Other abnormalities of gait and mobility: Secondary | ICD-10-CM | POA: Diagnosis not present

## 2023-01-21 DIAGNOSIS — E559 Vitamin D deficiency, unspecified: Secondary | ICD-10-CM | POA: Diagnosis not present

## 2023-01-21 DIAGNOSIS — R2681 Unsteadiness on feet: Secondary | ICD-10-CM | POA: Diagnosis not present

## 2023-01-21 DIAGNOSIS — E119 Type 2 diabetes mellitus without complications: Secondary | ICD-10-CM | POA: Diagnosis not present

## 2023-01-21 DIAGNOSIS — M6258 Muscle wasting and atrophy, not elsewhere classified, other site: Secondary | ICD-10-CM | POA: Diagnosis not present

## 2023-01-21 DIAGNOSIS — Z9181 History of falling: Secondary | ICD-10-CM | POA: Diagnosis not present

## 2023-01-21 DIAGNOSIS — R1311 Dysphagia, oral phase: Secondary | ICD-10-CM | POA: Diagnosis not present

## 2023-01-21 DIAGNOSIS — M6281 Muscle weakness (generalized): Secondary | ICD-10-CM | POA: Diagnosis not present

## 2023-01-21 DIAGNOSIS — I10 Essential (primary) hypertension: Secondary | ICD-10-CM | POA: Diagnosis not present

## 2023-01-21 DIAGNOSIS — R278 Other lack of coordination: Secondary | ICD-10-CM | POA: Diagnosis not present

## 2023-01-21 DIAGNOSIS — N390 Urinary tract infection, site not specified: Secondary | ICD-10-CM | POA: Diagnosis not present

## 2023-01-22 DIAGNOSIS — R2681 Unsteadiness on feet: Secondary | ICD-10-CM | POA: Diagnosis not present

## 2023-01-22 DIAGNOSIS — R278 Other lack of coordination: Secondary | ICD-10-CM | POA: Diagnosis not present

## 2023-01-22 DIAGNOSIS — M6281 Muscle weakness (generalized): Secondary | ICD-10-CM | POA: Diagnosis not present

## 2023-01-22 DIAGNOSIS — R2689 Other abnormalities of gait and mobility: Secondary | ICD-10-CM | POA: Diagnosis not present

## 2023-01-22 DIAGNOSIS — M6258 Muscle wasting and atrophy, not elsewhere classified, other site: Secondary | ICD-10-CM | POA: Diagnosis not present

## 2023-01-22 DIAGNOSIS — N390 Urinary tract infection, site not specified: Secondary | ICD-10-CM | POA: Diagnosis not present

## 2023-01-22 DIAGNOSIS — Z9181 History of falling: Secondary | ICD-10-CM | POA: Diagnosis not present

## 2023-01-22 DIAGNOSIS — R1311 Dysphagia, oral phase: Secondary | ICD-10-CM | POA: Diagnosis not present

## 2023-01-24 DIAGNOSIS — I69398 Other sequelae of cerebral infarction: Secondary | ICD-10-CM | POA: Diagnosis not present

## 2023-01-24 DIAGNOSIS — N390 Urinary tract infection, site not specified: Secondary | ICD-10-CM | POA: Diagnosis not present

## 2023-01-24 DIAGNOSIS — I69322 Dysarthria following cerebral infarction: Secondary | ICD-10-CM | POA: Diagnosis not present

## 2023-01-24 DIAGNOSIS — Z681 Body mass index (BMI) 19 or less, adult: Secondary | ICD-10-CM | POA: Diagnosis not present

## 2023-01-24 DIAGNOSIS — I69319 Unspecified symptoms and signs involving cognitive functions following cerebral infarction: Secondary | ICD-10-CM | POA: Diagnosis not present

## 2023-01-24 DIAGNOSIS — B964 Proteus (mirabilis) (morganii) as the cause of diseases classified elsewhere: Secondary | ICD-10-CM | POA: Diagnosis not present

## 2023-01-24 DIAGNOSIS — Z7982 Long term (current) use of aspirin: Secondary | ICD-10-CM | POA: Diagnosis not present

## 2023-01-24 DIAGNOSIS — R627 Adult failure to thrive: Secondary | ICD-10-CM | POA: Diagnosis not present

## 2023-01-24 DIAGNOSIS — E559 Vitamin D deficiency, unspecified: Secondary | ICD-10-CM | POA: Diagnosis not present

## 2023-01-24 DIAGNOSIS — Z7289 Other problems related to lifestyle: Secondary | ICD-10-CM | POA: Diagnosis not present

## 2023-01-24 DIAGNOSIS — H53462 Homonymous bilateral field defects, left side: Secondary | ICD-10-CM | POA: Diagnosis not present

## 2023-01-28 ENCOUNTER — Encounter: Payer: Self-pay | Admitting: *Deleted

## 2023-01-29 DIAGNOSIS — Z029 Encounter for administrative examinations, unspecified: Secondary | ICD-10-CM | POA: Diagnosis not present

## 2023-02-12 DIAGNOSIS — L900 Lichen sclerosus et atrophicus: Secondary | ICD-10-CM | POA: Diagnosis not present

## 2023-02-20 DIAGNOSIS — M6281 Muscle weakness (generalized): Secondary | ICD-10-CM | POA: Diagnosis not present

## 2023-02-21 DIAGNOSIS — M6281 Muscle weakness (generalized): Secondary | ICD-10-CM | POA: Diagnosis not present

## 2023-02-24 DIAGNOSIS — M6281 Muscle weakness (generalized): Secondary | ICD-10-CM | POA: Diagnosis not present

## 2023-02-25 DIAGNOSIS — I10 Essential (primary) hypertension: Secondary | ICD-10-CM | POA: Diagnosis not present

## 2023-02-25 DIAGNOSIS — I69319 Unspecified symptoms and signs involving cognitive functions following cerebral infarction: Secondary | ICD-10-CM | POA: Diagnosis not present

## 2023-02-25 DIAGNOSIS — E785 Hyperlipidemia, unspecified: Secondary | ICD-10-CM | POA: Diagnosis not present

## 2023-02-25 DIAGNOSIS — E559 Vitamin D deficiency, unspecified: Secondary | ICD-10-CM | POA: Diagnosis not present

## 2023-02-25 DIAGNOSIS — M6281 Muscle weakness (generalized): Secondary | ICD-10-CM | POA: Diagnosis not present

## 2023-02-26 DIAGNOSIS — M6281 Muscle weakness (generalized): Secondary | ICD-10-CM | POA: Diagnosis not present

## 2023-02-27 DIAGNOSIS — M6281 Muscle weakness (generalized): Secondary | ICD-10-CM | POA: Diagnosis not present

## 2023-02-28 DIAGNOSIS — I10 Essential (primary) hypertension: Secondary | ICD-10-CM | POA: Diagnosis not present

## 2023-02-28 DIAGNOSIS — M6281 Muscle weakness (generalized): Secondary | ICD-10-CM | POA: Diagnosis not present

## 2023-03-03 DIAGNOSIS — M6281 Muscle weakness (generalized): Secondary | ICD-10-CM | POA: Diagnosis not present

## 2023-03-04 DIAGNOSIS — M6281 Muscle weakness (generalized): Secondary | ICD-10-CM | POA: Diagnosis not present

## 2023-03-05 DIAGNOSIS — M6281 Muscle weakness (generalized): Secondary | ICD-10-CM | POA: Diagnosis not present

## 2023-03-06 DIAGNOSIS — M6281 Muscle weakness (generalized): Secondary | ICD-10-CM | POA: Diagnosis not present

## 2023-03-07 DIAGNOSIS — M6281 Muscle weakness (generalized): Secondary | ICD-10-CM | POA: Diagnosis not present

## 2023-03-10 DIAGNOSIS — M6281 Muscle weakness (generalized): Secondary | ICD-10-CM | POA: Diagnosis not present

## 2023-03-11 DIAGNOSIS — M6281 Muscle weakness (generalized): Secondary | ICD-10-CM | POA: Diagnosis not present

## 2023-03-12 DIAGNOSIS — M6281 Muscle weakness (generalized): Secondary | ICD-10-CM | POA: Diagnosis not present

## 2023-04-03 ENCOUNTER — Encounter (HOSPITAL_COMMUNITY): Payer: Self-pay | Admitting: Internal Medicine

## 2023-04-03 ENCOUNTER — Observation Stay (HOSPITAL_COMMUNITY): Payer: Medicare Other

## 2023-04-03 ENCOUNTER — Emergency Department (HOSPITAL_COMMUNITY): Payer: Medicare Other

## 2023-04-03 ENCOUNTER — Other Ambulatory Visit: Payer: Self-pay

## 2023-04-03 ENCOUNTER — Inpatient Hospital Stay (HOSPITAL_COMMUNITY)
Admission: EM | Admit: 2023-04-03 | Discharge: 2023-04-08 | DRG: 065 | Disposition: A | Discharge status: Skilled Nursing Facility | Payer: Medicare Other | Source: Skilled Nursing Facility | Attending: Internal Medicine | Admitting: Internal Medicine

## 2023-04-03 DIAGNOSIS — Z79899 Other long term (current) drug therapy: Secondary | ICD-10-CM

## 2023-04-03 DIAGNOSIS — Z634 Disappearance and death of family member: Secondary | ICD-10-CM

## 2023-04-03 DIAGNOSIS — R1310 Dysphagia, unspecified: Secondary | ICD-10-CM | POA: Diagnosis present

## 2023-04-03 DIAGNOSIS — I63522 Cerebral infarction due to unspecified occlusion or stenosis of left anterior cerebral artery: Secondary | ICD-10-CM | POA: Diagnosis not present

## 2023-04-03 DIAGNOSIS — Z515 Encounter for palliative care: Secondary | ICD-10-CM

## 2023-04-03 DIAGNOSIS — Z7982 Long term (current) use of aspirin: Secondary | ICD-10-CM | POA: Diagnosis not present

## 2023-04-03 DIAGNOSIS — Z7401 Bed confinement status: Secondary | ICD-10-CM | POA: Diagnosis not present

## 2023-04-03 DIAGNOSIS — R509 Fever, unspecified: Secondary | ICD-10-CM | POA: Diagnosis not present

## 2023-04-03 DIAGNOSIS — R61 Generalized hyperhidrosis: Secondary | ICD-10-CM | POA: Diagnosis not present

## 2023-04-03 DIAGNOSIS — D72829 Elevated white blood cell count, unspecified: Secondary | ICD-10-CM | POA: Diagnosis not present

## 2023-04-03 DIAGNOSIS — I69320 Aphasia following cerebral infarction: Secondary | ICD-10-CM

## 2023-04-03 DIAGNOSIS — Z743 Need for continuous supervision: Secondary | ICD-10-CM | POA: Diagnosis not present

## 2023-04-03 DIAGNOSIS — R6889 Other general symptoms and signs: Secondary | ICD-10-CM | POA: Diagnosis not present

## 2023-04-03 DIAGNOSIS — I639 Cerebral infarction, unspecified: Secondary | ICD-10-CM | POA: Diagnosis present

## 2023-04-03 DIAGNOSIS — Z1152 Encounter for screening for COVID-19: Secondary | ICD-10-CM

## 2023-04-03 DIAGNOSIS — R1312 Dysphagia, oropharyngeal phase: Secondary | ICD-10-CM | POA: Diagnosis not present

## 2023-04-03 DIAGNOSIS — R000 Tachycardia, unspecified: Secondary | ICD-10-CM | POA: Diagnosis not present

## 2023-04-03 DIAGNOSIS — M6281 Muscle weakness (generalized): Secondary | ICD-10-CM | POA: Diagnosis not present

## 2023-04-03 DIAGNOSIS — E860 Dehydration: Secondary | ICD-10-CM | POA: Diagnosis not present

## 2023-04-03 DIAGNOSIS — I69319 Unspecified symptoms and signs involving cognitive functions following cerebral infarction: Secondary | ICD-10-CM | POA: Diagnosis not present

## 2023-04-03 DIAGNOSIS — E785 Hyperlipidemia, unspecified: Secondary | ICD-10-CM | POA: Diagnosis present

## 2023-04-03 DIAGNOSIS — F1410 Cocaine abuse, uncomplicated: Secondary | ICD-10-CM | POA: Diagnosis present

## 2023-04-03 DIAGNOSIS — Z833 Family history of diabetes mellitus: Secondary | ICD-10-CM

## 2023-04-03 DIAGNOSIS — I63511 Cerebral infarction due to unspecified occlusion or stenosis of right middle cerebral artery: Secondary | ICD-10-CM | POA: Diagnosis not present

## 2023-04-03 DIAGNOSIS — I6389 Other cerebral infarction: Secondary | ICD-10-CM | POA: Diagnosis not present

## 2023-04-03 DIAGNOSIS — I6523 Occlusion and stenosis of bilateral carotid arteries: Secondary | ICD-10-CM | POA: Diagnosis not present

## 2023-04-03 DIAGNOSIS — E119 Type 2 diabetes mellitus without complications: Secondary | ICD-10-CM | POA: Diagnosis not present

## 2023-04-03 DIAGNOSIS — Z87891 Personal history of nicotine dependence: Secondary | ICD-10-CM

## 2023-04-03 DIAGNOSIS — Z7189 Other specified counseling: Secondary | ICD-10-CM | POA: Diagnosis not present

## 2023-04-03 DIAGNOSIS — Z8673 Personal history of transient ischemic attack (TIA), and cerebral infarction without residual deficits: Secondary | ICD-10-CM | POA: Diagnosis not present

## 2023-04-03 DIAGNOSIS — G8114 Spastic hemiplegia affecting left nondominant side: Secondary | ICD-10-CM | POA: Diagnosis present

## 2023-04-03 DIAGNOSIS — J9811 Atelectasis: Secondary | ICD-10-CM | POA: Diagnosis not present

## 2023-04-03 DIAGNOSIS — I69322 Dysarthria following cerebral infarction: Secondary | ICD-10-CM | POA: Diagnosis not present

## 2023-04-03 DIAGNOSIS — H518 Other specified disorders of binocular movement: Secondary | ICD-10-CM | POA: Diagnosis present

## 2023-04-03 DIAGNOSIS — R569 Unspecified convulsions: Secondary | ICD-10-CM | POA: Diagnosis present

## 2023-04-03 DIAGNOSIS — I6503 Occlusion and stenosis of bilateral vertebral arteries: Secondary | ICD-10-CM | POA: Diagnosis present

## 2023-04-03 DIAGNOSIS — R29810 Facial weakness: Secondary | ICD-10-CM | POA: Diagnosis present

## 2023-04-03 DIAGNOSIS — G459 Transient cerebral ischemic attack, unspecified: Secondary | ICD-10-CM | POA: Diagnosis not present

## 2023-04-03 DIAGNOSIS — S30850A Superficial foreign body of lower back and pelvis, initial encounter: Secondary | ICD-10-CM | POA: Diagnosis not present

## 2023-04-03 DIAGNOSIS — R930 Abnormal findings on diagnostic imaging of skull and head, not elsewhere classified: Secondary | ICD-10-CM | POA: Diagnosis not present

## 2023-04-03 DIAGNOSIS — R2681 Unsteadiness on feet: Secondary | ICD-10-CM | POA: Diagnosis not present

## 2023-04-03 DIAGNOSIS — I10 Essential (primary) hypertension: Secondary | ICD-10-CM | POA: Diagnosis not present

## 2023-04-03 DIAGNOSIS — R1311 Dysphagia, oral phase: Secondary | ICD-10-CM | POA: Diagnosis not present

## 2023-04-03 DIAGNOSIS — I499 Cardiac arrhythmia, unspecified: Secondary | ICD-10-CM | POA: Diagnosis not present

## 2023-04-03 DIAGNOSIS — F0150 Vascular dementia without behavioral disturbance: Secondary | ICD-10-CM | POA: Diagnosis present

## 2023-04-03 DIAGNOSIS — R29818 Other symptoms and signs involving the nervous system: Secondary | ICD-10-CM | POA: Diagnosis not present

## 2023-04-03 DIAGNOSIS — R55 Syncope and collapse: Secondary | ICD-10-CM | POA: Diagnosis not present

## 2023-04-03 DIAGNOSIS — R2689 Other abnormalities of gait and mobility: Secondary | ICD-10-CM | POA: Diagnosis not present

## 2023-04-03 DIAGNOSIS — R278 Other lack of coordination: Secondary | ICD-10-CM | POA: Diagnosis not present

## 2023-04-03 DIAGNOSIS — R279 Unspecified lack of coordination: Secondary | ICD-10-CM | POA: Diagnosis not present

## 2023-04-03 DIAGNOSIS — I6621 Occlusion and stenosis of right posterior cerebral artery: Secondary | ICD-10-CM | POA: Diagnosis not present

## 2023-04-03 DIAGNOSIS — Z91013 Allergy to seafood: Secondary | ICD-10-CM

## 2023-04-03 DIAGNOSIS — I6930 Unspecified sequelae of cerebral infarction: Secondary | ICD-10-CM | POA: Diagnosis present

## 2023-04-03 LAB — DIFFERENTIAL
Abs Immature Granulocytes: 0.11 10*3/uL — ABNORMAL HIGH (ref 0.00–0.07)
Basophils Absolute: 0 10*3/uL (ref 0.0–0.1)
Basophils Relative: 0 %
Eosinophils Absolute: 0 10*3/uL (ref 0.0–0.5)
Eosinophils Relative: 0 %
Immature Granulocytes: 1 %
Lymphocytes Relative: 5 %
Lymphs Abs: 0.6 10*3/uL — ABNORMAL LOW (ref 0.7–4.0)
Monocytes Absolute: 0.6 10*3/uL (ref 0.1–1.0)
Monocytes Relative: 5 %
Neutro Abs: 10.9 10*3/uL — ABNORMAL HIGH (ref 1.7–7.7)
Neutrophils Relative %: 89 %

## 2023-04-03 LAB — CBC
HCT: 41.8 % (ref 39.0–52.0)
Hemoglobin: 13.1 g/dL (ref 13.0–17.0)
MCH: 26.7 pg (ref 26.0–34.0)
MCHC: 31.3 g/dL (ref 30.0–36.0)
MCV: 85.3 fL (ref 80.0–100.0)
Platelets: 221 10*3/uL (ref 150–400)
RBC: 4.9 MIL/uL (ref 4.22–5.81)
RDW: 14.1 % (ref 11.5–15.5)
WBC: 12.3 10*3/uL — ABNORMAL HIGH (ref 4.0–10.5)
nRBC: 0 % (ref 0.0–0.2)

## 2023-04-03 LAB — COMPREHENSIVE METABOLIC PANEL
ALT: 38 U/L (ref 0–44)
AST: 38 U/L (ref 15–41)
Albumin: 3.4 g/dL — ABNORMAL LOW (ref 3.5–5.0)
Alkaline Phosphatase: 124 U/L (ref 38–126)
Anion gap: 14 (ref 5–15)
BUN: 11 mg/dL (ref 8–23)
CO2: 19 mmol/L — ABNORMAL LOW (ref 22–32)
Calcium: 8.4 mg/dL — ABNORMAL LOW (ref 8.9–10.3)
Chloride: 104 mmol/L (ref 98–111)
Creatinine, Ser: 1.25 mg/dL — ABNORMAL HIGH (ref 0.61–1.24)
GFR, Estimated: >60 mL/min (ref >60)
Glucose, Bld: 120 mg/dL — ABNORMAL HIGH (ref 70–99)
Potassium: 3.7 mmol/L (ref 3.5–5.1)
Sodium: 137 mmol/L (ref 135–145)
Total Bilirubin: 0.6 mg/dL (ref 0.3–1.2)
Total Protein: 6.8 g/dL (ref 6.5–8.1)

## 2023-04-03 LAB — URINALYSIS, ROUTINE W REFLEX MICROSCOPIC
Bacteria, UA: NONE SEEN
Bilirubin Urine: NEGATIVE
Glucose, UA: NEGATIVE mg/dL
Ketones, ur: NEGATIVE mg/dL
Leukocytes,Ua: NEGATIVE
Nitrite: NEGATIVE
Protein, ur: NEGATIVE mg/dL
Specific Gravity, Urine: 1.019 (ref 1.005–1.030)
pH: 7 (ref 5.0–8.0)

## 2023-04-03 LAB — RAPID URINE DRUG SCREEN, HOSP PERFORMED
Amphetamines: NOT DETECTED
Barbiturates: NOT DETECTED
Benzodiazepines: NOT DETECTED
Cocaine: NOT DETECTED
Opiates: NOT DETECTED
Tetrahydrocannabinol: NOT DETECTED

## 2023-04-03 LAB — I-STAT CHEM 8, ED
BUN: 13 mg/dL (ref 8–23)
Calcium, Ion: 1.03 mmol/L — ABNORMAL LOW (ref 1.15–1.40)
Chloride: 105 mmol/L (ref 98–111)
Creatinine, Ser: 1.2 mg/dL (ref 0.61–1.24)
Glucose, Bld: 117 mg/dL — ABNORMAL HIGH (ref 70–99)
HCT: 41 % (ref 39.0–52.0)
Hemoglobin: 13.9 g/dL (ref 13.0–17.0)
Potassium: 3.7 mmol/L (ref 3.5–5.1)
Sodium: 141 mmol/L (ref 135–145)
TCO2: 19 mmol/L — ABNORMAL LOW (ref 22–32)

## 2023-04-03 LAB — APTT: aPTT: 25 seconds (ref 24–36)

## 2023-04-03 LAB — PROTIME-INR
INR: 1.2 (ref 0.8–1.2)
Prothrombin Time: 15.3 seconds — ABNORMAL HIGH (ref 11.4–15.2)

## 2023-04-03 LAB — CBG MONITORING, ED: Glucose-Capillary: 116 mg/dL — ABNORMAL HIGH (ref 70–99)

## 2023-04-03 LAB — SARS CORONAVIRUS 2 BY RT PCR: SARS Coronavirus 2 by RT PCR: NEGATIVE

## 2023-04-03 LAB — ETHANOL: Alcohol, Ethyl (B): <10 mg/dL (ref <10)

## 2023-04-03 LAB — HIV ANTIBODY (ROUTINE TESTING W REFLEX): HIV Screen 4th Generation wRfx: NONREACTIVE

## 2023-04-03 MED ORDER — SENNOSIDES-DOCUSATE SODIUM 8.6-50 MG PO TABS: 1.0000 | ORAL_TABLET | Freq: Every evening | ORAL | Status: DC | PRN

## 2023-04-03 MED ORDER — ASPIRIN 300 MG RE SUPP: 300.0000 mg | Freq: Every day | RECTAL | Status: DC

## 2023-04-03 MED ORDER — ACETAMINOPHEN 160 MG/5ML PO SOLN: 650.0000 mg | ORAL | Status: DC | PRN

## 2023-04-03 MED ORDER — ACETAMINOPHEN 325 MG PO TABS: 650.0000 mg | ORAL_TABLET | Freq: Once | ORAL | Status: AC

## 2023-04-03 MED ORDER — DIVALPROEX SODIUM 125 MG PO CSDR
125.0000 mg | DELAYED_RELEASE_CAPSULE | Freq: Two times a day (BID) | ORAL | Status: DC
  Administered 2023-04-04 – 2023-04-08 (×10): 125 mg via ORAL
  Filled 2023-04-03 (×10): qty 1

## 2023-04-03 MED ORDER — PAROXETINE HCL 30 MG PO TABS
15.0000 mg | ORAL_TABLET | Freq: Every day | ORAL | Status: DC
  Administered 2023-04-04 – 2023-04-06 (×4): 15 mg via ORAL
  Filled 2023-04-03 (×8): qty 0.5

## 2023-04-03 MED ORDER — DIAZEPAM 5 MG/ML IJ SOLN
5.0000 mg | Freq: Once | INTRAMUSCULAR | Status: AC | PRN
  Administered 2023-04-03: 5 mg via INTRAVENOUS
  Filled 2023-04-03: qty 2

## 2023-04-03 MED ORDER — SODIUM CHLORIDE 0.9 % IV BOLUS
1000.0000 mL | Freq: Once | INTRAVENOUS | Status: AC
  Administered 2023-04-03: 1000 mL via INTRAVENOUS

## 2023-04-03 MED ORDER — STROKE: EARLY STAGES OF RECOVERY BOOK
Freq: Once | Status: AC
  Filled 2023-04-03: qty 1

## 2023-04-03 MED ORDER — ACETAMINOPHEN 325 MG PO TABS
650.0000 mg | ORAL_TABLET | ORAL | Status: DC | PRN
  Administered 2023-04-06 – 2023-04-07 (×2): 650 mg via ORAL
  Filled 2023-04-03 (×2): qty 2

## 2023-04-03 MED ORDER — ATORVASTATIN CALCIUM 40 MG PO TABS
40.0000 mg | ORAL_TABLET | Freq: Every day | ORAL | Status: DC
  Administered 2023-04-04 – 2023-04-07 (×5): 40 mg via ORAL
  Filled 2023-04-03 (×5): qty 1

## 2023-04-03 MED ORDER — SODIUM CHLORIDE 0.9 % IV BOLUS
500.0000 mL | Freq: Once | INTRAVENOUS | Status: AC
  Administered 2023-04-03: 500 mL via INTRAVENOUS

## 2023-04-03 MED ORDER — IOHEXOL 350 MG/ML SOLN
75.0000 mL | Freq: Once | INTRAVENOUS | Status: AC | PRN
  Administered 2023-04-03: 75 mL via INTRAVENOUS

## 2023-04-03 MED ORDER — ACETAMINOPHEN 650 MG RE SUPP
650.0000 mg | RECTAL | Status: DC | PRN
  Filled 2023-04-03: qty 1

## 2023-04-03 MED ORDER — ASPIRIN 325 MG PO TABS
325.0000 mg | ORAL_TABLET | Freq: Every day | ORAL | Status: DC
  Administered 2023-04-03: 325 mg via ORAL
  Filled 2023-04-03 (×3): qty 1

## 2023-04-03 MED ORDER — ACETAMINOPHEN 650 MG RE SUPP
975.0000 mg | Freq: Once | RECTAL | Status: AC
  Administered 2023-04-03: 975 mg via RECTAL

## 2023-04-03 MED ORDER — ACETAMINOPHEN 650 MG RE SUPP
650.0000 mg | Freq: Once | RECTAL | Status: AC
  Administered 2023-04-03: 650 mg via RECTAL
  Filled 2023-04-03: qty 1

## 2023-04-03 NOTE — Plan of Care (Signed)
Briefly discussed with Dr. Imogene Burn, patient is now febrile.  No obvious localizing infectious symptoms per Dr. Imogene Burn, chest x-ray and UA were negative  Central fever is a possibility given his very large stroke, however given embolic event, possibility of endocarditis should be considered and I recommend blood cultures given patient is a full CODE STATUS.  He did receive aspirin today for stroke but I would hold further aspirin for now pending workup due to high risk of ICH with endocarditis.  This may be restarted at the discretion of stroke team tomorrow

## 2023-04-03 NOTE — ED Notes (Signed)
ED TO INPATIENT HANDOFF REPORT  ED Nurse Name and Phone #: [phone]  S Name/Age/Gender [name] 67 y.o. male Room/Bed: 018C/018C  Code Status   Code Status: Full Code  Home/SNF/Other Rehab Patient oriented to: self Is this baseline? No   Triage Complete: Triage complete  Chief Complaint Acute CVA (cerebrovascular accident) [facility]) [I63.9]  Triage Note Pt to the ed from nursing facility with left sided weakness. Pt has a hx stroke but per facility staff pt is more weak and unable to tx as he normally will. Pt is A&Ox 1 at baseline. Pt is now flaccid on left side. Per ems pt had a HR in the 160 when they arrived that slowed to 120 after fluids.     Allergies Allergies  Allergen Reactions   Shellfish Allergy Itching and Other (See Comments)    Shrimp, especially    Level of Care/Admitting Diagnosis ED Disposition     ED Disposition  Admit   Condition  --   Comment  Hospital Area: [facility] [[id number]]  Level of Care: Telemetry Medical [[id number]]  May place patient in observation at [facility] or [facility] if equivalent level of care is available:: No  Covid Evaluation: Asymptomatic - no recent exposure (last 10 days) testing not required  Diagnosis: Acute CVA (cerebrovascular accident) [facility]) [[id number]]  Admitting Physician: Synetta Fail [[id number]]  Attending Physician: Synetta Fail [[id number]]          B Medical/Surgery History 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   Heart murmur    Hypertension    Psoriasis    "back and stomach" (02/03/2013)   Stroke (HCC) 02/01/2013   left frontal CVA and symptoms of right arm and leg weakness and expressive aphasia/notes 02/03/2013 (02/03/2013)   Past Surgical History:  Procedure Laterality Date   NO PAST SURGERIES     TEE WITHOUT  CARDIOVERSION N/A 02/09/2013   Procedure: TRANSESOPHAGEAL ECHOCARDIOGRAM (TEE);  Surgeon: Lewayne Bunting, MD;  Location: [facility] ENDOSCOPY;  Service: Cardiovascular;  Laterality: N/A;     A IV Location/Drains/Wounds Patient Lines/Drains/Airways Status     Active Line/Drains/Airways     Name Placement date Placement time Site Days   External Urinary Catheter 09/10/19  1930  --  1301   Wound / Incision (Open or Dehisced) 09/10/19 Other (Comment) Penis small skin tear noted. 09/10/19  2040  Penis  1301            Intake/Output Last 24 hours No intake or output data in the 24 hours ending 04/03/23 1652  Labs/Imaging Results for orders placed or performed during the hospital encounter of 04/03/23 (from the past 48 hour(s))  CBG monitoring, ED     Status: Abnormal   Collection Time: 04/03/23  2:35 PM  Result Value Ref Range   Glucose-Capillary 116 (H) 70 - 99 mg/dL    Comment: Glucose reference range applies only to samples taken after fasting for at least 8 hours.  Ethanol     Status: None   Collection Time: 04/03/23  2:35 PM  Result Value Ref Range   Alcohol, Ethyl (B) <10 <10 mg/dL    Comment: (NOTE) Lowest detectable limit for serum alcohol is 10 mg/dL.  For medical purposes only. Performed at [facility], [address] [place], [place] [address]  Protime-INR     Status: Abnormal   Collection Time: 04/03/23  2:35 PM  Result Value Ref Range   Prothrombin Time 15.3 (H) 11.4 - 15.2 seconds   INR 1.2 0.8 - 1.2    Comment: (NOTE) INR goal varies based on device and disease states. Performed at [facility], [address] [place], [place] [address]   APTT     Status: None   Collection Time: 04/03/23  2:35 PM  Result Value Ref Range   aPTT 25 24 - 36 seconds    Comment: Performed at [facility], [address] [place], [place] [address]  CBC     Status: Abnormal   Collection Time: 04/03/23  2:35 PM  Result Value Ref Range   WBC 12.3 (H) 4.0 -  10.5 K/uL   RBC 4.90 4.22 - 5.81 MIL/uL   Hemoglobin 13.1 13.0 - 17.0 g/dL   HCT 09.8 11.9 - 14.7 %   MCV 85.3 80.0 - 100.0 fL   MCH 26.7 26.0 - 34.0 pg   MCHC 31.3 30.0 - 36.0 g/dL   RDW 82.9 56.2 - 13.0 %   Platelets 221 150 - 400 K/uL   nRBC 0.0 0.0 - 0.2 %    Comment: Performed at [facility], [address] [place], [place] [address]  Differential     Status: Abnormal   Collection Time: 04/03/23  2:35 PM  Result Value Ref Range   Neutrophils Relative % 89 %   Neutro Abs 10.9 (H) 1.7 - 7.7 K/uL   Lymphocytes Relative 5 %   Lymphs Abs 0.6 (L) 0.7 - 4.0 K/uL   Monocytes Relative 5 %   Monocytes Absolute 0.6 0.1 - 1.0 K/uL   Eosinophils Relative 0 %   Eosinophils Absolute 0.0 0.0 - 0.5 K/uL   Basophils Relative 0 %   Basophils Absolute 0.0 0.0 - 0.1 K/uL   Immature Granulocytes 1 %   Abs Immature Granulocytes 0.11 (H) 0.00 - 0.07 K/uL    Comment: Performed at [facility], [address], [place], [place] [address]  Comprehensive metabolic panel     Status: Abnormal   Collection Time: 04/03/23  2:35 PM  Result Value Ref Range   Sodium 137 135 - 145 mmol/L   Potassium 3.7 3.5 - 5.1 mmol/L   Chloride 104 98 - 111 mmol/L   CO2 19 (L) 22 - 32 mmol/L   Glucose, Bld 120 (H) 70 - 99 mg/dL    Comment: Glucose reference range applies only to samples taken after fasting for at least 8 hours.   BUN 11 8 - 23 mg/dL   Creatinine, Ser 9.52 (H) 0.61 - 1.24 mg/dL   Calcium 8.4 (L) 8.9 - 10.3 mg/dL   Total Protein 6.8 6.5 - 8.1 g/dL   Albumin 3.4 (L) 3.5 - 5.0 g/dL   AST 38 15 - 41 U/L   ALT 38 0 - 44 U/L   Alkaline Phosphatase 124 38 - 126 U/L   Total Bilirubin 0.6 0.3 - 1.2 mg/dL   GFR, Estimated >84 >13 mL/min    Comment: (NOTE) Calculated using the CKD-EPI Creatinine Equation (2021)    Anion gap 14 5 - 15    Comment: Performed at [facility], [address], [place], [place] [address]  I-stat chem 8, ED     Status: Abnormal   Collection Time: 04/03/23   2:41 PM  Result Value Ref Range  Sodium 141 135 - 145 mmol/L   Potassium 3.7 3.5 - 5.1 mmol/L   Chloride 105 98 - 111 mmol/L   BUN 13 8 - 23 mg/dL   Creatinine, Ser 1.47 0.61 - 1.24 mg/dL   Glucose, Bld 829 (H) 70 - 99 mg/dL    Comment: Glucose reference range applies only to samples taken after fasting for at least 8 hours.   Calcium, Ion 1.03 (L) 1.15 - 1.40 mmol/L   TCO2 19 (L) 22 - 32 mmol/L   Hemoglobin 13.9 13.0 - 17.0 g/dL   HCT 56.2 13.0 - 86.5 %   CT ANGIO HEAD NECK W WO CM (CODE STROKE)  Result Date: 04/03/2023 CLINICAL DATA:  Neuro deficit, acute, stroke suspected EXAM: CT ANGIOGRAPHY HEAD AND NECK WITH AND WITHOUT CONTRAST TECHNIQUE: Multidetector CT imaging of the head and neck was performed using the standard protocol during bolus administration of intravenous contrast. Multiplanar CT image reconstructions and MIPs were obtained to evaluate the vascular anatomy. Carotid stenosis measurements (when applicable) are obtained utilizing NASCET criteria, using the distal internal carotid diameter as the denominator. RADIATION DOSE REDUCTION: This exam was performed according to the departmental dose-optimization program which includes automated exposure control, adjustment of the mA and/or kV according to patient size and/or use of iterative reconstruction technique. CONTRAST:  75mL OMNIPAQUE IOHEXOL 350 MG/ML SOLN COMPARISON:  Same day CT head. FINDINGS: CTA NECK FINDINGS Aortic arch: Great vessel origins are patent without significant stenosis. Atherosclerosis. Right carotid system: No evidence of dissection, stenosis (50% or greater), or occlusion. Left carotid system: No evidence of dissection, stenosis (50% or greater), or occlusion. Carotid bifurcation atherosclerosis. Vertebral arteries: Moderate right vertebral artery origin stenosis. Severe left vertebral artery origin stenosis. Right dominant. Skeleton: No acute abnormality. Other neck: No acute abnormality. Upper chest: Visualized  lung apices are clear. Paraseptal emphysema. Review of the MIP images confirms the above findings CTA HEAD FINDINGS Anterior circulation: Calcific atherosclerosis of the internal carotid arteries. Critical stenosis versus occlusion of the right paraclinoid ICA. Asymmetric opacification of the right M1 MCA. Occluded right M2 MCA vessel. Left MCA and ACAs are patent. Right A1 ACAs not opacified. Distal right ACA stenosis. Severe left M2 MCA stenosis. Posterior circulation: Bilateral intradural vertebral arteries, basilar artery and bilateral posterior cerebral arteries are patent. Severe right P2 PCA stenosis. Venous sinuses: As permitted by contrast timing, patent. Review of the MIP images confirms the above findings IMPRESSION: 1. Occluded right M2 MCA branch. 2. Critical stenosis versus occlusion of the right paraclinoid ICA. Asymmetrically diminished right M1 MCA opacification. 3. Severe left and moderate right vertebral artery origin stenosis. 4. Severe right P2 PCA stenosis with irregular opacification. 5. Severe left M2 MCA stenosis. 6. Aortic Atherosclerosis (ICD10-I70.0) and Emphysema (ICD10-J43.9). Preliminary CTA findings discussed with Dr. Derry Lory via telephone at 3 p.m. on 04/03/2023. Electronically Signed   By: Feliberto Harts M.D.   On: 04/03/2023 15:37   CT HEAD CODE STROKE WO CONTRAST  Result Date: 04/03/2023 CLINICAL DATA:  Code stroke. Neuro deficit, acute, stroke suspected. EXAM: CT HEAD WITHOUT CONTRAST TECHNIQUE: Contiguous axial images were obtained from the base of the skull through the vertex without intravenous contrast. RADIATION DOSE REDUCTION: This exam was performed according to the departmental dose-optimization program which includes automated exposure control, adjustment of the mA and/or kV according to patient size and/or use of iterative reconstruction technique. COMPARISON:  CT head 08/27/2019. FINDINGS: Brain: Multiple remote infarcts in the cerebellum. Remote right PCA  territory and left ACA territory infarcts.  Motion limits assessment but there appear to be areas of edema and loss of gray differentiation involving the right basal ganglia, insula and overlying frontal lobe. No evidence of acute infarct, mass lesion, midline shift or hydrocephalus. Vascular: Possible hyperdense right M2 MCA. Skull: No acute fracture. Sinuses/Orbits: No acute abnormality. ASPECTS [facility] Stroke Program Early CT Score) - Ganglionic level infarction (caudate, lentiform nuclei, internal capsule, insula, M1-M3 cortex): 1 - Supraganglionic infarction (M4-M6 cortex): 2 Total score (0-10 with 10 being normal): 3 IMPRESSION: 1. Findings concerning for acute/recent right MCA territory infarct with loss of gray-white involving the right basal ganglia, insula and frontal lobe. ASPECTS is 3. 2. Possible hyperdense right M2 MCA branch. Recommend correlation with forthcoming CTA. 3. Multiple remote infarcts, detailed above. Code stroke imaging results were communicated on 04/03/2023 at 3:00 pm to provider Dr. Derry Lory Via telephone, who verbally acknowledged these results. Electronically Signed   By: Feliberto Harts M.D.   On: 04/03/2023 15:06    Pending Labs Unresulted Labs (From admission, onward)     Start     Ordered   04/04/23 0500  Lipid panel  (Labs)  Tomorrow morning,   R       Comments: Fasting    04/03/23 1530   04/04/23 0500  Hemoglobin A1c  (Labs)  Tomorrow morning,   R       Comments: To assess prior glycemic control    04/03/23 1530   04/03/23 1639  HIV Antibody (routine testing w rflx)  (HIV Antibody (Routine testing w reflex) panel)  Once,   R        04/03/23 1641   04/03/23 1615  SARS Coronavirus 2 by RT PCR (hospital order, performed in [facility] hospital lab) *cepheid single result test* Anterior Nasal Swab  (SARS Coronavirus 2 by RT PCR (hospital order, performed in [facility] hospital lab) *cepheid single result test*)  ONCE - URGENT,   URGENT        04/03/23 1614    04/03/23 1438  Urine rapid drug screen (hosp performed)  Once,   STAT        04/03/23 1437   04/03/23 1438  Urinalysis, Routine w reflex microscopic -Urine, Clean Catch  Once,   URGENT       Question:  Specimen Source  Answer:  Urine, Clean Catch   04/03/23 1437            Vitals/Pain Today's Vitals   04/03/23 1400 04/03/23 1440 04/03/23 1516  BP:  138/84 130/77  Pulse:  (!) 121 (!) 115  Resp:  (!) 21 (!) 21  Temp:  (!) 100.6 F (38.1 C) (!) 100.6 F (38.1 C)  TempSrc:  Oral Oral  SpO2:  98% 98%  Weight: 75.6 kg  75 kg  Height: 5\' 9"  (1.753 m)  5\' 9"  (1.753 m)  PainSc:   0-No pain    Isolation Precautions Airborne and Contact precautions  Medications Medications   stroke: early stages of recovery book (has no administration in time range)  aspirin suppository 300 mg ( Rectal See Alternative 04/03/23 1630)    Or  aspirin tablet 325 mg (325 mg Oral Given 04/03/23 1630)  atorvastatin (LIPITOR) tablet 40 mg (has no administration in time range)  PARoxetine (PAXIL) tablet 15 mg (has no administration in time range)  divalproex (DEPAKOTE SPRINKLE) capsule 125 mg (has no administration in time range)  acetaminophen (TYLENOL) tablet 650 mg (has no administration in time range)    Or  acetaminophen (TYLENOL) 160  MG/5ML solution 650 mg (has no administration in time range)    Or  acetaminophen (TYLENOL) suppository 650 mg (has no administration in time range)  senna-docusate (Senokot-S) tablet 1 tablet (has no administration in time range)  iohexol (OMNIPAQUE) 350 MG/ML injection 75 mL (75 mLs Intravenous Contrast Given 04/03/23 1522)    Mobility non-ambulatory     Focused Assessments Neuro Assessment Handoff:  Swallow screen pass? No    NIH Stroke Scale  Dizziness Present: No Headache Present: No Interval: Initial Level of Consciousness (1a.)   : Alert, keenly responsive LOC Questions (1b. )   : Answers neither question correctly LOC Commands (1c. )   : Performs  both tasks correctly Best Gaze (2. )  : Forced deviation Visual (3. )  : Complete hemianopia Facial Palsy (4. )    : Partial paralysis  Motor Arm, Left (5a. )   : No movement Motor Arm, Right (5b. ) : No drift Motor Leg, Left (6a. )  : No movement Motor Leg, Right (6b. ) : No drift Limb Ataxia (7. ): Absent Sensory (8. )  : Severe to total sensory loss, patient is not aware of being touched in the face, arm, and leg Best Language (9. )  : No aphasia Dysarthria (10. ): Mild-to-moderate dysarthria, patient slurs at least some words and, at worst, can be understood with some difficulty Extinction/Inattention (11.)   : No Abnormality Complete NIHSS TOTAL: 19 Last date known well: 04/03/23 Last time known well: 0930 Neuro Assessment:   Neuro Checks:   Initial (04/03/23 1500)  Has TPA been given? No If patient is a Neuro Trauma and patient is going to OR before floor call report to 4N Charge nurse: [phone] or [phone]   R Recommendations: See Admitting Provider Note  Report given to:   Additional Notes:

## 2023-04-03 NOTE — ED Triage Notes (Signed)
Pt to the ed from nursing facility with left sided weakness. Pt has a hx stroke but per facility staff pt is more weak and unable to tx as he normally will. Pt is A&Ox 1 at baseline. Pt is now flaccid on left side. Per ems pt had a HR in the 160 when they arrived that slowed to 120 after fluids.

## 2023-04-03 NOTE — ED Provider Notes (Signed)
Emergency Department Provider Note   I have reviewed the triage vital signs and the nursing notes.   HISTORY  Chief Complaint Code Stroke   HPI [name] is a 67 y.o. male with past history of prior stroke, hypertension, angina presents to the emergency department from his skilled nursing facility with left-sided weakness.  At baseline he lies in bed and will eat but poorly verbal at baseline.  Staff noticed that he seemed more weak on the left compared to his normal and called 911.  EMS arrived to find him tachycardic which improved with IV fluids and route.  Last normal at breakfast. Patient arrives with fever.   Level 5 caveat: CVA  Past Medical History:  Diagnosis Date   Anginal pain (HCC)    Heart murmur    Hypertension    Psoriasis    "back and stomach" (02/03/2013)   Stroke (HCC) 02/01/2013   left frontal CVA and symptoms of right arm and leg weakness and expressive aphasia/notes 02/03/2013 (02/03/2013)    Review of Systems  Level 5 caveat: CVA  ____________________________________________   PHYSICAL EXAM:  VITAL SIGNS: ED Triage Vitals  Encounter Vitals Group     BP 04/03/23 1440 138/84     Pulse Rate 04/03/23 1440 (!) 121     Resp 04/03/23 1440 (!) 21     Temp 04/03/23 1440 (!) 100.6 F (38.1 C)     Temp Source 04/03/23 1440 Oral     SpO2 04/03/23 1440 98 %     Weight 04/03/23 1400 166 lb 10.7 oz (75.6 kg)     Height 04/03/23 1400 5\' 9"  (1.753 m)   Constitutional: No distress. Awake.  Eyes: Conjunctivae are normal.  Head: Atraumatic. Nose: No congestion/rhinnorhea. Mouth/Throat: Mucous membranes are moist.   Neck: No stridor.   Cardiovascular: Normal rate, regular rhythm. Good peripheral circulation. Grossly normal heart sounds.   Respiratory: Normal respiratory effort.  No retractions. Lungs CTAB. Gastrointestinal: No distention.  Neurologic:  Normal speech and language. Alert to person only. Flaccid in the LUE and LLE.  Skin:  Skin is warm,  dry and intact. No rash noted.   ____________________________________________   LABS (all labs ordered are listed, but only abnormal results are displayed)  Labs Reviewed  PROTIME-INR - Abnormal; Notable for the following components:      Result Value   Prothrombin Time 15.3 (*)    All other components within normal limits  CBC - Abnormal; Notable for the following components:   WBC 12.3 (*)    All other components within normal limits  DIFFERENTIAL - Abnormal; Notable for the following components:   Neutro Abs 10.9 (*)    Lymphs Abs 0.6 (*)    Abs Immature Granulocytes 0.11 (*)    All other components within normal limits  COMPREHENSIVE METABOLIC PANEL - Abnormal; Notable for the following components:   CO2 19 (*)    Glucose, Bld 120 (*)    Creatinine, Ser 1.25 (*)    Calcium 8.4 (*)    Albumin 3.4 (*)    All other components within normal limits  CBG MONITORING, ED - Abnormal; Notable for the following components:   Glucose-Capillary 116 (*)    All other components within normal limits  I-STAT CHEM 8, ED - Abnormal; Notable for the following components:   Glucose, Bld 117 (*)    Calcium, Ion 1.03 (*)    TCO2 19 (*)    All other components within normal limits  ETHANOL  APTT  RAPID URINE DRUG SCREEN, HOSP PERFORMED  URINALYSIS, ROUTINE W REFLEX MICROSCOPIC   ____________________________________________  EKG   EKG Interpretation Date/Time:  Thursday April 03 2023 15:07:48 EDT Ventricular Rate:  120 PR Interval:  168 QRS Duration:  99 QT Interval:  322 QTC Calculation: 455 R Axis:   -77  Text Interpretation: Sinus tachycardia Left anterior fascicular block Consider right ventricular hypertrophy Confirmed by Alona Bene [id number]) on 04/03/2023 3:25:24 PM        ____________________________________________  RADIOLOGY  CT HEAD CODE STROKE WO CONTRAST  Result Date: 04/03/2023 CLINICAL DATA:  Code stroke. Neuro deficit, acute, stroke suspected. EXAM: CT HEAD  WITHOUT CONTRAST TECHNIQUE: Contiguous axial images were obtained from the base of the skull through the vertex without intravenous contrast. RADIATION DOSE REDUCTION: This exam was performed according to the departmental dose-optimization program which includes automated exposure control, adjustment of the mA and/or kV according to patient size and/or use of iterative reconstruction technique. COMPARISON:  CT head 08/27/2019. FINDINGS: Brain: Multiple remote infarcts in the cerebellum. Remote right PCA territory and left ACA territory infarcts. Motion limits assessment but there appear to be areas of edema and loss of gray differentiation involving the right basal ganglia, insula and overlying frontal lobe. No evidence of acute infarct, mass lesion, midline shift or hydrocephalus. Vascular: Possible hyperdense right M2 MCA. Skull: No acute fracture. Sinuses/Orbits: No acute abnormality. ASPECTS [facility] Stroke Program Early CT Score) - Ganglionic level infarction (caudate, lentiform nuclei, internal capsule, insula, M1-M3 cortex): 1 - Supraganglionic infarction (M4-M6 cortex): 2 Total score (0-10 with 10 being normal): 3 IMPRESSION: 1. Findings concerning for acute/recent right MCA territory infarct with loss of gray-white involving the right basal ganglia, insula and frontal lobe. ASPECTS is 3. 2. Possible hyperdense right M2 MCA branch. Recommend correlation with forthcoming CTA. 3. Multiple remote infarcts, detailed above. Code stroke imaging results were communicated on 04/03/2023 at 3:00 pm to provider Dr. Derry Lory Via telephone, who verbally acknowledged these results. Electronically Signed   By: Feliberto Harts M.D.   On: 04/03/2023 15:06    ____________________________________________   PROCEDURES  Procedure(s) performed:   Procedures  CRITICAL CARE Performed by: Maia Plan Total critical care time: 35 minutes Critical care time was exclusive of separately billable procedures and  treating other patients. Critical care was necessary to treat or prevent imminent or life-threatening deterioration. Critical care was time spent personally by me on the following activities: development of treatment plan with patient and/or surrogate as well as nursing, discussions with consultants, evaluation of patient's response to treatment, examination of patient, obtaining history from patient or surrogate, ordering and performing treatments and interventions, ordering and review of laboratory studies, ordering and review of radiographic studies, pulse oximetry and re-evaluation of patient's condition.  Alona Bene, MD Emergency Medicine  ____________________________________________   INITIAL IMPRESSION / ASSESSMENT AND PLAN / ED COURSE  Pertinent labs & imaging results that were available during my care of the patient were reviewed by me and considered in my medical decision making (see chart for details).   This patient is Presenting for Evaluation of AMS, which does require a range of treatment options, and is a complaint that involves a high risk of morbidity and mortality.  The Differential Diagnoses includes but is not exclusive to alcohol, illicit or prescription medications, intracranial pathology such as stroke, intracerebral hemorrhage, fever or infectious causes including sepsis, hypoxemia, uremia, trauma, endocrine related disorders such as diabetes, hypoglycemia, thyroid-related diseases, etc.   Critical Interventions-    Medications  iohexol (OMNIPAQUE) 350 MG/ML injection 75 mL (75 mLs Intravenous Contrast Given 04/03/23 1522)    Reassessment after intervention: symptoms unchanged.   Clinical Laboratory Tests Ordered, included CBC with mild leukocytosis to 12.3.  Creatinine 1.25.  Normal INR.  Radiologic Tests Ordered, included CT head. I independently interpreted the images and agree with radiology interpretation.   Consult complete with Neurology. Patient outside  of TNK window and not a thrombolysis candidate. Plan for CVA workup and medicine admit.   Medical Decision Making: Summary:  Patient arrives to the emergency department as a code stroke.  He is not a candidate for TNK as he is outside the window.  Has loss of gray-white differentiation and other CT findings concerning for stroke.  Poor functioning at baseline.  Plan for medicine admit for stroke workup but no acute neurointervention at this time.  Reevaluation with update and discussion with   ***Considered admission***  Patient's presentation is most consistent with acute presentation with potential threat to life or bodily function.   Disposition:   ____________________________________________  FINAL CLINICAL IMPRESSION(S) / ED DIAGNOSES  Final diagnoses:  None     NEW OUTPATIENT MEDICATIONS STARTED DURING THIS VISIT:  New Prescriptions   No medications on file    Note:  This document was prepared using Dragon voice recognition software and may include unintentional dictation errors.  Alona Bene, MD, [facility] Emergency Medicine

## 2023-04-03 NOTE — Progress Notes (Signed)
LTM EEG hooked up and running - no initial skin breakdown - push button tested - Patient currently in the ED. No [facility] at this time

## 2023-04-03 NOTE — Progress Notes (Signed)
Eeg coming off for MRI. To be reapplied after

## 2023-04-03 NOTE — Progress Notes (Signed)
Patient arrived from ED to [phone]. VSS. Tylenol given for te,mp. Patient noted incontinent, pericare provided, external catheter setup. Will report to next shift to send urine sample once pt voids. Family at bedside.TELE applied and confirmed, MD aware of pt's elevated HR.

## 2023-04-03 NOTE — H&P (Signed)
History and Physical   [name] OZD:[id number] DOB: March 15, 1956 DOA: 04/03/2023  PCP: Clovis [name], L.August Saucer, MD   Patient coming from: Skilled nursing facility  Chief Complaint: Code stroke, left-sided weakness  HPI: [name] is a 67 y.o. male with medical history significant of stroke with residual spastic hemiplegia, hypertension, hyperlipidemia, cocaine use presenting after concern for new neurologic deficit at nursing facility.  History obtained with assistance of family.  Patient at baseline is reportedly bedbound but can feed himself, is poorly verbal at baseline.  These symptoms stem from previous significant CVA.  Today staff at facility noticed increased weakness on the left side.  Last known normal was at breakfast.  Patient was subsequently transported to ED via EMS.  Patient seen by neurology on arrival as code stroke and determined to be outside the [facility] window.  Further evaluation showed patient was a poor candidate for any interventional thrombectomy.  Unable to obtain accurate review of systems due to patient's difficulty with communicating and poorly verbal status.  ED Course: Vital signs in the ED notable for fever to 100.6, heart rate in the 110s to 120s, respiratory rate in the 20s, blood pressure in the 130s systolic.  Lab workup included CMP with bicarb 19, creatinine 1.25 from baseline of 1, glucose 120, calcium 8.4, albumin 3.4.  CBC with leukocytosis to 12.3.  PT mildly elevated at 15.3 with INR and PTT normal.  COVID screening pending.  Urinalysis pending.  UDS pending.  Ethanol level negative.  Lipid panel and A1c also pending.  CT head showed no evidence of acute or recent infarct.  CTA head and neck showed occluded right M2 MCA branch, stenosis versus occlusion of right paraclinoid ICA, moderate to severe bilateral vertebral artery stenosis at origins, severe right P2 PCA stenosis, severe left M2 MCA stenosis.  MR brain ordered and is pending.  Patient received  aspirin in the ED.  Neurology has seen patient on arrival as code stroke as above.  Review of Systems: Unable to obtain accurate review of systems due to patient's difficulty with communicating and poorly verbal status.  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 His smoking use included cigarettes. He started smoking about 47 years ago. He has a 9.3 pack-year smoking history. He has never used smokeless tobacco. He reports current alcohol use of about 17.0 standard drinks of alcohol per week. He reports current drug use. Drug: Cocaine.  Allergies  Allergen Reactions   Shellfish Allergy Itching and Other (See Comments)    Shrimp, especially    Family History  Problem Relation Age of Onset   Diabetes Mother   Reviewed on admission  Prior to Admission medications   Medication Sig Start Date End Date Taking? Authorizing Provider  Amino Acids-Protein Hydrolys (PRO-STAT AWC) LIQD Take 30 mLs by mouth 2 (two) times daily.   Yes [provider]  amLODipine (NORVASC) 10 MG tablet Take 1 tablet (10 mg total) by mouth daily. 09/17/19  Yes Glade Lloyd, MD  aspirin EC 81 MG tablet Take 81 mg by mouth daily. Swallow whole.   Yes [provider]  atorvastatin (LIPITOR)  40 MG tablet Take 40 mg by mouth at bedtime.   Yes [provider]  divalproex (DEPAKOTE SPRINKLE) 125 MG capsule Take 125 mg by mouth 2 (two) times daily. 03/07/23  Yes [provider]  lisinopril (ZESTRIL) 30 MG tablet Take 1 tablet (30 mg total) by mouth daily. 09/17/19  Yes Glade Lloyd, MD  PARoxetine (PAXIL) 30 MG tablet Take 15 mg by mouth at bedtime. 03/04/23  Yes [provider]  senna-docusate (SENOKOT-S) 8.6-50 MG tablet Take 1 tablet by mouth 2 (two) times daily. 09/16/19  Yes Glade Lloyd, MD  Vitamin D, Ergocalciferol, (DRISDOL) 1.25 MG (50000 UNIT) CAPS capsule Take 50,000 Units by mouth every 7 (seven) days. Given on Mondays 03/17/23  Yes [provider]    Physical Exam: Vitals:   04/03/23 1400 04/03/23 1440 04/03/23 1516  BP:  138/84 130/77  Pulse:  (!) 121 (!) 115  Resp:  (!) 21 (!) 21  Temp:  (!) 100.6 F (38.1 C) (!) 100.6 F (38.1 C)  TempSrc:  Oral Oral  SpO2:  98% 98%  Weight: 75.6 kg  75 kg  Height: 5\' 9"  (1.753 m)  5\' 9"  (1.753 m)    Physical Exam Constitutional:      General: He is not in acute distress.    Appearance: Normal appearance.  HENT:     Head: Normocephalic and atraumatic.     Mouth/Throat:     Mouth: Mucous membranes are moist.     Pharynx: Oropharynx is clear.  Eyes:     Extraocular Movements: Extraocular movements intact.     Pupils: Pupils are equal, round, and reactive to light.  Cardiovascular:     Rate and Rhythm: Regular rhythm. Tachycardia present.     Pulses: Normal pulses.     Heart sounds: Normal heart sounds.  Pulmonary:     Effort: Pulmonary effort is normal. No respiratory distress.     Breath sounds: Normal breath sounds.  Abdominal:     General: Bowel sounds are normal. There is no distension.     Palpations: Abdomen is soft.     Tenderness: There is no abdominal tenderness.  Musculoskeletal:        General: No swelling or deformity.  Skin:    General: Skin is warm and dry.   Neurological:     General: No focal deficit present.     Mental Status: Mental status is at baseline.     Comments: Mental Status: Limited by patient's poorly verbal status. Possibly some neglect of the left side Cranial Nerves: II: Pupils equal, round, and reactive to light.   III,IV, VI: Patient does not follow finger with eyes past midline to the left will follow finger to the right VIII: hearing is intact to voice X: Uvula elevates symmetrically XI: Shoulder shrug is symmetric. XII: Tongue deviated to the left Motor: Intermittent effort thorughout, at Least 0-1/5 LUE, 4-5/5 RUE, 1-2/5 LLE, 4/5 RLE     Labs on Admission: I have personally reviewed following labs and imaging studies  CBC: Recent Labs  Lab 04/03/23 1435 04/03/23 1441  WBC 12.3*  --  NEUTROABS 10.9*  --   HGB 13.1 13.9  HCT 41.8 41.0  MCV 85.3  --   PLT 221  --     Basic Metabolic Panel: Recent Labs  Lab 04/03/23 1435 04/03/23 1441  NA 137 141  K 3.7 3.7  CL 104 105  CO2 19*  --   GLUCOSE 120* 117*  BUN 11 13  CREATININE 1.25* 1.20  CALCIUM 8.4*  --     GFR: Estimated Creatinine Clearance: 61.4 mL/min (by C-G formula based on SCr of 1.2 mg/dL).  Liver Function Tests: Recent Labs  Lab 04/03/23 1435  AST 38  ALT 38  ALKPHOS 124  BILITOT 0.6  PROT 6.8  ALBUMIN 3.4*    Urine analysis:    Component Value Date/Time   COLORURINE YELLOW 11/21/2022 1400   APPEARANCEUR CLEAR 11/21/2022 1400   LABSPEC 1.028 11/21/2022 1400   PHURINE 5.0 11/21/2022 1400   GLUCOSEU NEGATIVE 11/21/2022 1400   HGBUR SMALL (A) 11/21/2022 1400   BILIRUBINUR NEGATIVE 11/21/2022 1400   KETONESUR 80 (A) 11/21/2022 1400   PROTEINUR 30 (A) 11/21/2022 1400   UROBILINOGEN 0.2 02/04/2013 1315   NITRITE NEGATIVE 11/21/2022 1400   LEUKOCYTESUR NEGATIVE 11/21/2022 1400    Radiological Exams on Admission: CT ANGIO HEAD NECK W WO CM (CODE STROKE)  Result Date: 04/03/2023 CLINICAL DATA:  Neuro deficit, acute,  stroke suspected EXAM: CT ANGIOGRAPHY HEAD AND NECK WITH AND WITHOUT CONTRAST TECHNIQUE: Multidetector CT imaging of the head and neck was performed using the standard protocol during bolus administration of intravenous contrast. Multiplanar CT image reconstructions and MIPs were obtained to evaluate the vascular anatomy. Carotid stenosis measurements (when applicable) are obtained utilizing NASCET criteria, using the distal internal carotid diameter as the denominator. RADIATION DOSE REDUCTION: This exam was performed according to the departmental dose-optimization program which includes automated exposure control, adjustment of the mA and/or kV according to patient size and/or use of iterative reconstruction technique. CONTRAST:  75mL OMNIPAQUE IOHEXOL 350 MG/ML SOLN COMPARISON:  Same day CT head. FINDINGS: CTA NECK FINDINGS Aortic arch: Great vessel origins are patent without significant stenosis. Atherosclerosis. Right carotid system: No evidence of dissection, stenosis (50% or greater), or occlusion. Left carotid system: No evidence of dissection, stenosis (50% or greater), or occlusion. Carotid bifurcation atherosclerosis. Vertebral arteries: Moderate right vertebral artery origin stenosis. Severe left vertebral artery origin stenosis. Right dominant. Skeleton: No acute abnormality. Other neck: No acute abnormality. Upper chest: Visualized lung apices are clear. Paraseptal emphysema. Review of the MIP images confirms the above findings CTA HEAD FINDINGS Anterior circulation: Calcific atherosclerosis of the internal carotid arteries. Critical stenosis versus occlusion of the right paraclinoid ICA. Asymmetric opacification of the right M1 MCA. Occluded right M2 MCA vessel. Left MCA and ACAs are patent. Right A1 ACAs not opacified. Distal right ACA stenosis. Severe left M2 MCA stenosis. Posterior circulation: Bilateral intradural vertebral arteries, basilar artery and bilateral posterior cerebral arteries are  patent. Severe right P2 PCA stenosis. Venous sinuses: As permitted by contrast timing, patent. Review of the MIP images confirms the above findings IMPRESSION: 1. Occluded right M2 MCA branch. 2. Critical stenosis versus occlusion of the right paraclinoid ICA. Asymmetrically diminished right M1 MCA opacification. 3. Severe left and moderate right vertebral artery origin stenosis. 4. Severe right P2 PCA stenosis with irregular opacification. 5. Severe left M2 MCA stenosis. 6. Aortic Atherosclerosis (ICD10-I70.0) and Emphysema (ICD10-J43.9). Preliminary CTA findings discussed with Dr. Derry Lory via telephone at 3 p.m. on 04/03/2023. Electronically Signed   By: Gelene Mink  Barnett Applebaum M.D.   On: 04/03/2023 15:37   CT HEAD CODE STROKE WO CONTRAST  Result Date: 04/03/2023 CLINICAL DATA:  Code stroke. Neuro deficit, acute, stroke suspected. EXAM: CT HEAD WITHOUT CONTRAST TECHNIQUE: Contiguous axial images were obtained from the base of the skull through the vertex without intravenous contrast. RADIATION DOSE REDUCTION: This exam was performed according to the departmental dose-optimization program which includes automated exposure control, adjustment of the mA and/or kV according to patient size and/or use of iterative reconstruction technique. COMPARISON:  CT head 08/27/2019. FINDINGS: Brain: Multiple remote infarcts in the cerebellum. Remote right PCA territory and left ACA territory infarcts. Motion limits assessment but there appear to be areas of edema and loss of gray differentiation involving the right basal ganglia, insula and overlying frontal lobe. No evidence of acute infarct, mass lesion, midline shift or hydrocephalus. Vascular: Possible hyperdense right M2 MCA. Skull: No acute fracture. Sinuses/Orbits: No acute abnormality. ASPECTS [facility] Stroke Program Early CT Score) - Ganglionic level infarction (caudate, lentiform nuclei, internal capsule, insula, M1-M3 cortex): 1 - Supraganglionic infarction (M4-M6  cortex): 2 Total score (0-10 with 10 being normal): 3 IMPRESSION: 1. Findings concerning for acute/recent right MCA territory infarct with loss of gray-white involving the right basal ganglia, insula and frontal lobe. ASPECTS is 3. 2. Possible hyperdense right M2 MCA branch. Recommend correlation with forthcoming CTA. 3. Multiple remote infarcts, detailed above. Code stroke imaging results were communicated on 04/03/2023 at 3:00 pm to provider Dr. Derry Lory Via telephone, who verbally acknowledged these results. Electronically Signed   By: Feliberto Harts M.D.   On: 04/03/2023 15:06    EKG: Independently reviewed.  Sinus tachycardia at 120 bpm.  Nonspecific T wave flattening.  Low voltage multiple leads.  Assessment/Plan Principal Problem:   Acute CVA (cerebrovascular accident) [facility]) Active Problems:   History of CVA (cerebrovascular accident)   HTN (hypertension)   Aphasia as late effect of cerebrovascular accident   Cocaine abuse (HCC)   Hyperlipidemia   Acute CVA History of CVA with residual deficits ?Rule out seizure > Patient presenting with left-sided weakness from skilled nursing facility. > History of prior CVA with spastic hemiaplasia, aphasia, apraxia. > Evidence of acute/recent stroke on CT head.  CTA head and neck with multiple stenosis possibly occluded vessels. > Patient out of window for TNKase in the ED and deemed poor candidate for intervention/thrombectomy. > Some repetitive movements noted in the ED > Neurology saw patient on arrival as code stroke and are following - Appreciate neurology recommendations - Allow for permissive HTN (systolic < 220 and diastolic < 120) - ASA  - Continue home statin   - Echocardiogram  - MR brain - A1C  - Lipid panel  - Tele monitoring  - SLP eval - PT/OT - Follow-up EEG - Continue home Depakote  Fever and leukocytosis > Unclear etiology at this time could be secondary to CVA versus yet to be identified infection.  Could have  had underlying viral infection and reactive leukocytosis in the setting of stroke as well. > Urinalysis ordered in the ED, will add on chest x-ray. - Follow-up urinalysis - Chest x-ray - Trend fever curve WBC - Follow-up COVID screen  Tachycardia > Possibly related to fever above versus stroke. - Tylenol now and then as needed - Continue to monitor  Hypertension - Holding home antihypertensives in the setting of permissive hypertension as above  Hyperlipidemia - Continue home atorvastatin  DVT prophylaxis: SCDs Code Status:   Full, confirmed with MOST form at  bedside Family Communication:  Attempted to reach son, [name], by phone but there was no answer Disposition Plan:   Patient is from:  SNF  Anticipated DC to:  Same as above  Anticipated DC date:  1 to 3 days  Anticipated DC barriers: None  Consults called:  Neurology Admission status:  Observation, telemetry  Severity of Illness: The appropriate patient status for this patient is OBSERVATION. Observation status is judged to be reasonable and necessary in order to provide the required intensity of service to ensure the patient's safety. The patient's presenting symptoms, physical exam findings, and initial radiographic and laboratory data in the context of their medical condition is felt to place them at decreased risk for further clinical deterioration. Furthermore, it is anticipated that the patient will be medically stable for discharge from the hospital within 2 midnights of admission.    Synetta Fail MD [facility]  How to contact the [facility] Attending or Consulting provider 7A - 7P or covering provider during after hours 7P -7A, for this patient?   Check the care team in [facility] and look for a) attending/consulting [facility] provider listed and b) the  [facility] team listed Log into [email or web address] and use [place]'s universal password to access. If you do not have the password, please contact the hospital  operator. Locate the [facility] provider you are looking for under [facility] and page to a number that you can be directly reached. If you still have difficulty reaching the provider, please page the [facility] (Director on Call) for the Hospitalists listed on amion for assistance.  04/03/2023, 4:47 PM

## 2023-04-03 NOTE — Consult Note (Addendum)
Neurology Consultation  Reason for Consult: left sided weakness, right gaze and left facial droop Referring Physician: Dr. Jacqulyn Bath  CC: none  History is obtained from:EMS, facility and chart  HPI: [name] is a 67 y.o. male with history of two strokes, vascular dementia, hypertension and hyperlipidemia who presents from his facility today with new onset left sided weakness, right gaze and left sided facial droop.  He was last known to be normal at breakfast time this morning and then was noted to slump over in his wheelchair and become diaphoretic with left-sided weakness.  On arrival to the ED, he was noted to have left sided weakness, left facial droop and intermittent shaking movements of the right upper and lower extremities.   LKW: 0930 TNK given?: no, outside of window IR Thrombectomy? No, poor baseline mRS Modified Rankin Scale: 5-Severe disability-bedridden, incontinent, needs constant attention  ROS: Unable to obtain due to altered mental status.   Past Medical History:  Diagnosis Date   Anginal pain (HCC)    Heart murmur    Hypertension    Psoriasis    "back and stomach" (02/03/2013)   Stroke (HCC) 02/01/2013   left frontal CVA and symptoms of right arm and leg weakness and expressive aphasia/notes 02/03/2013 (02/03/2013)     Family History  Problem Relation Age of Onset   Diabetes Mother      Social History:   reports that he quit smoking about 9 years ago. His smoking use included cigarettes. He started smoking about 47 years ago. He has a 9.3 pack-year smoking history. He has never used smokeless tobacco. He reports current alcohol use of about 17.0 standard drinks of alcohol per week. He reports current drug use. Drug: Cocaine.  Medications No current facility-administered medications for this encounter.  Current Outpatient Medications:    amLODipine (NORVASC) 10 MG tablet, Take 1 tablet (10 mg total) by mouth daily., Disp: 30 tablet, Rfl: 0   aspirin 325 MG  tablet, Take 1 tablet (325 mg total) by mouth daily., Disp: , Rfl:    atorvastatin (LIPITOR) 40 MG tablet, Take 40 mg by mouth at bedtime., Disp: , Rfl:    atorvastatin (LIPITOR) 80 MG tablet, Take 1 tablet (80 mg total) by mouth daily at 6 PM. (Patient not taking: Reported on 11/21/2022), Disp: 30 tablet, Rfl: 0   carvedilol (COREG) 6.25 MG tablet, Take 1 tablet (6.25 mg total) by mouth 2 (two) times daily with a meal., Disp: 30 tablet, Rfl: 0   lisinopril (ZESTRIL) 30 MG tablet, Take 1 tablet (30 mg total) by mouth daily., Disp: 30 tablet, Rfl: 0   senna-docusate (SENOKOT-S) 8.6-50 MG tablet, Take 1 tablet by mouth 2 (two) times daily., Disp: 30 tablet, Rfl: 0   Exam: Current vital signs: Ht 5\' 9"  (1.753 m)   Wt 75.6 kg   BMI 24.61 kg/m  Vital signs in last 24 hours: Weight:  [75.6 kg] 75.6 kg (07/18 1400)  GENERAL: Awake, alert, in no acute distress Psych: Affect appropriate for situation, patient is calm and cooperative with examination Head: Normocephalic and atraumatic, without obvious abnormality EENT: Normal conjunctivae, moist mucous membranes, no OP obstruction LUNGS: Normal respiratory effort. Non-labored breathing on supplemental O2 CV: Regular, tachycardic rate and rhythm on telemetry Extremities: warm, well perfused, without obvious deformity  NEURO:  Mental Status: Awake, alert, and oriented to person only He is not able to provide a clear and coherent history of present illness. Speech/Language: speech is slightly dysarthric.   No neglect is  noted Cranial Nerves:  II: PERRL left hemianopsia III, IV, VI: right gaze deviation VII: Left sided facial droop VIII: Hearing intact to voice IX, X: Voice is slightly dysarthric XII: Tongue deviated to the left   Motor: Normal antigravity strength of left upper and lower extremities, no movement of right upper and lower extremities Sensation: diminished on the left Coordination: FTN intact on the right.  Gait:  Deferred  NIHSS: 1a Level of Conscious.: 0 1b LOC Questions: 2 1c LOC Commands: 0 2 Best Gaze: 2 3 Visual: 2 4 Facial Palsy: 2 5a Motor Arm - left: 4 5b Motor Arm - Right: 0 6a Motor Leg - Left: 4 6b Motor Leg - Right: 0 7 Limb Ataxia: 0 8 Sensory: 2 9 Best Language: 0 10 Dysarthria: 1 11 Extinct. and Inatten.: 0 TOTAL: 19   Labs I have reviewed labs in epic and the results pertinent to this consultation are:   CBC    Component Value Date/Time   WBC 12.3 (H) 04/03/2023 1435   RBC 4.90 04/03/2023 1435   HGB 13.9 04/03/2023 1441   HCT 41.0 04/03/2023 1441   PLT 221 04/03/2023 1435   MCV 85.3 04/03/2023 1435   MCH 26.7 04/03/2023 1435   MCHC 31.3 04/03/2023 1435   RDW 14.1 04/03/2023 1435   LYMPHSABS 0.6 (L) 04/03/2023 1435   MONOABS 0.6 04/03/2023 1435   EOSABS 0.0 04/03/2023 1435   BASOSABS 0.0 04/03/2023 1435    CMP     Component Value Date/Time   NA 141 04/03/2023 1441   K 3.7 04/03/2023 1441   CL 105 04/03/2023 1441   CO2 27 11/21/2022 0905   GLUCOSE 117 (H) 04/03/2023 1441   BUN 13 04/03/2023 1441   CREATININE 1.20 04/03/2023 1441   CALCIUM 8.7 (L) 11/21/2022 0905   PROT 8.0 11/21/2022 0905   ALBUMIN 4.0 11/21/2022 0905   AST 21 11/21/2022 0905   ALT 23 11/21/2022 0905   ALKPHOS 102 11/21/2022 0905   BILITOT 1.4 (H) 11/21/2022 0905   GFRNONAA >60 11/21/2022 0905   GFRAA >60 09/16/2019 0327    Lipid Panel     Component Value Date/Time   CHOL 167 08/27/2019 0846   TRIG 98 08/27/2019 0846   HDL 40 (L) 08/27/2019 0846   CHOLHDL 4.2 08/27/2019 0846   VLDL 20 08/27/2019 0846   LDLCALC 107 (H) 08/27/2019 0846     Imaging I have reviewed the images obtained:  CT-scan of the brain: loss of grey-white differentiation in right MCA territory, hyperdense right M2 MCA branch, ASPECTS 3  CTA head and neck: Right M2 MCA occlusion  MRI brain: pending  Assessment: 67 year old patient with history of two strokes, vascular dementia, hypertension and  hyperlipidemia presents with acute onset left-sided weakness, right gaze deviation and left-sided facial droop.  He also was noted to have shaking movements of the right upper and lower extremities.  CT head shows loss of gray-white differentiation in right MCA territory along with hyperdense right M2 branch, and CT angio shows right MCA M2 occlusion.  Patient is outside the window for TNK, and his poor baseline mRS as makes him a poor candidate for mechanical thrombectomy.  Patient's condition and prognosis were discussed with his son, who agrees with supportive care only.  Patient was noted to have shaking movements of right upper and lower extremities which self terminated, however with old strokes and dementia, he has significant seizure risk factors, so overnight EEG will be placed.  Impression: Acute ischemic stroke  due to right MCA M2 occlusion  Recommendations: Stroke/TIA Workup  - Admit for stroke workup - Permissive HTN x48 hrs from sx onset goal BP <220/110. PRN labetalol or hydralazine if BP above these parameters. Avoid oral antihypertensives. - MRI brain wo contrast - TTE w/ bubble - Check A1c and LDL + add statin per guidelines - aspirin 325 mg daily antiplt/anticoag - q4 hr neuro checks - STAT head CT for any change in neuro exam - Tele - PT/OT/SLP - Stroke education - Amb referral to neurology upon discharge   -Long-term EEG to evaluate shaking movements  Pt seen by NP/Neuro and later by MD. Note/plan to be edited by MD as needed.  Cortney E Ernestina Columbia , MSN, AGACNP-BC [facility] See Amion for schedule and pager information 04/03/2023 3:12 PM   NEUROHOSPITALIST ADDENDUM Performed a face to face diagnostic evaluation.   I have reviewed the contents of history and physical exam as documented by PA/ARNP/Resident and agree with above documentation.  I have discussed and formulated the above plan as documented. Edits to the note have been made as  needed.  Impression/Key exam findings/Plan: He is outside tnkase window on arrival. He has significantly poor functional baseline. Essentially picks food up with fingers to feed himself but is otherwise dependent on staff at his SNF for all ADLs and is bed bound. Given significantly poor baseline and ASPECTS of 3, he is not a candidate for thrombectomy. He is significantly dependent on others for all his ADLs and will still continue to be dependent even if thrombectomy was successful and thus very little upside with elevated risk of ICH.  I spoke with patient's son in detail about this. Medicine admission for stroke workup.  In addition, he has intermittent episodes of shaking for which we will put up on LTM. If no seizures overnight, can be discontinued in AM.  This patient is critically ill and at significant risk of neurological worsening, death and care requires constant monitoring of vital signs, hemodynamics,respiratory and cardiac monitoring, neurological assessment, discussion with family, other specialists and medical decision making of high complexity. I spent 40 minutes of neurocritical care time  in the care of  this patient. This was time spent independent of any time provided by nurse practitioner or PA.  Erick Blinks [facility] 04/03/2023  5:57 PM   Erick Blinks, MD [facility] [phone]   If 7pm to 7am, please call on call as listed on AMION.

## 2023-04-03 NOTE — Code Documentation (Signed)
Stroke Response Nurse Documentation Code Documentation  [name] is a 67 y.o. male arriving to [facility]  via [facility] on 7/18 with past medical hx of CVA, HTN, vascular dementia, substance abuse, HLD. On No antithrombotic. Code stroke was activated by EMS.   Patient from [facility] where he was LKW at 0930 when staff put him in the chair from bed. He was found around 1000 with L sided weakness, facial droop, and R gaze preference.  Stroke team at the bedside on patient arrival. Labs drawn and patient cleared for CT by EDP. Patient to CT with team. NIHSS 19, see documentation for details and code stroke times. Patient with disoriented, right gaze preference , left hemianopia, left facial droop, left arm weakness, left leg weakness, left decreased sensation, and dysarthria  on exam. The following imaging was completed:  CT Head and CTA. Patient is not a candidate for IV Thrombolytic due to being out of the treatment window. Patient is not not a candidate for IR due to mRs 4 and other coomorbidities.   Care Plan: EEG, MRI supportive care.   Bedside handoff with ED RN Candise Bowens.    Pearlie Oyster  Stroke Response RN

## 2023-04-04 ENCOUNTER — Encounter (HOSPITAL_COMMUNITY): Payer: Self-pay | Admitting: Internal Medicine

## 2023-04-04 ENCOUNTER — Observation Stay (HOSPITAL_COMMUNITY): Payer: Medicare Other

## 2023-04-04 DIAGNOSIS — I69320 Aphasia following cerebral infarction: Secondary | ICD-10-CM | POA: Diagnosis not present

## 2023-04-04 DIAGNOSIS — I639 Cerebral infarction, unspecified: Secondary | ICD-10-CM | POA: Diagnosis present

## 2023-04-04 DIAGNOSIS — I6389 Other cerebral infarction: Secondary | ICD-10-CM

## 2023-04-04 DIAGNOSIS — R000 Tachycardia, unspecified: Secondary | ICD-10-CM | POA: Diagnosis present

## 2023-04-04 DIAGNOSIS — D72829 Elevated white blood cell count, unspecified: Secondary | ICD-10-CM | POA: Diagnosis present

## 2023-04-04 DIAGNOSIS — Z833 Family history of diabetes mellitus: Secondary | ICD-10-CM | POA: Diagnosis not present

## 2023-04-04 DIAGNOSIS — Z634 Disappearance and death of family member: Secondary | ICD-10-CM | POA: Diagnosis not present

## 2023-04-04 DIAGNOSIS — Z79899 Other long term (current) drug therapy: Secondary | ICD-10-CM | POA: Diagnosis not present

## 2023-04-04 DIAGNOSIS — E119 Type 2 diabetes mellitus without complications: Secondary | ICD-10-CM | POA: Diagnosis present

## 2023-04-04 DIAGNOSIS — R29810 Facial weakness: Secondary | ICD-10-CM | POA: Diagnosis present

## 2023-04-04 DIAGNOSIS — I6503 Occlusion and stenosis of bilateral vertebral arteries: Secondary | ICD-10-CM | POA: Diagnosis present

## 2023-04-04 DIAGNOSIS — I63511 Cerebral infarction due to unspecified occlusion or stenosis of right middle cerebral artery: Secondary | ICD-10-CM | POA: Diagnosis present

## 2023-04-04 DIAGNOSIS — E785 Hyperlipidemia, unspecified: Secondary | ICD-10-CM | POA: Diagnosis present

## 2023-04-04 DIAGNOSIS — I10 Essential (primary) hypertension: Secondary | ICD-10-CM | POA: Diagnosis present

## 2023-04-04 DIAGNOSIS — F1410 Cocaine abuse, uncomplicated: Secondary | ICD-10-CM | POA: Diagnosis present

## 2023-04-04 DIAGNOSIS — Z87891 Personal history of nicotine dependence: Secondary | ICD-10-CM | POA: Diagnosis not present

## 2023-04-04 DIAGNOSIS — R569 Unspecified convulsions: Secondary | ICD-10-CM

## 2023-04-04 DIAGNOSIS — Z7189 Other specified counseling: Secondary | ICD-10-CM | POA: Diagnosis not present

## 2023-04-04 DIAGNOSIS — Z515 Encounter for palliative care: Secondary | ICD-10-CM | POA: Diagnosis not present

## 2023-04-04 DIAGNOSIS — R509 Fever, unspecified: Secondary | ICD-10-CM | POA: Diagnosis present

## 2023-04-04 DIAGNOSIS — Z7982 Long term (current) use of aspirin: Secondary | ICD-10-CM | POA: Diagnosis not present

## 2023-04-04 DIAGNOSIS — F0150 Vascular dementia without behavioral disturbance: Secondary | ICD-10-CM | POA: Diagnosis present

## 2023-04-04 DIAGNOSIS — G8114 Spastic hemiplegia affecting left nondominant side: Secondary | ICD-10-CM | POA: Diagnosis present

## 2023-04-04 DIAGNOSIS — R1310 Dysphagia, unspecified: Secondary | ICD-10-CM | POA: Diagnosis present

## 2023-04-04 DIAGNOSIS — H518 Other specified disorders of binocular movement: Secondary | ICD-10-CM | POA: Diagnosis present

## 2023-04-04 DIAGNOSIS — Z7401 Bed confinement status: Secondary | ICD-10-CM | POA: Diagnosis not present

## 2023-04-04 DIAGNOSIS — Z1152 Encounter for screening for COVID-19: Secondary | ICD-10-CM | POA: Diagnosis not present

## 2023-04-04 LAB — ECHOCARDIOGRAM COMPLETE
Area-P 1/2: 6.54 cm2
Height: 69 in
S' Lateral: 3.6 cm
Weight: 2645.52 oz

## 2023-04-04 LAB — RESPIRATORY PANEL BY PCR

## 2023-04-04 LAB — CBC WITH DIFFERENTIAL/PLATELET
Abs Immature Granulocytes: 0.06 10*3/uL (ref 0.00–0.07)
Basophils Absolute: 0 10*3/uL (ref 0.0–0.1)
Basophils Relative: 0 %
Eosinophils Absolute: 0 10*3/uL (ref 0.0–0.5)
Eosinophils Relative: 0 %
HCT: 43 % (ref 39.0–52.0)
Hemoglobin: 13.8 g/dL (ref 13.0–17.0)
Immature Granulocytes: 1 %
Lymphocytes Relative: 17 %
Lymphs Abs: 2.1 10*3/uL (ref 0.7–4.0)
MCH: 26.3 pg (ref 26.0–34.0)
MCHC: 32.1 g/dL (ref 30.0–36.0)
MCV: 82.1 fL (ref 80.0–100.0)
Monocytes Absolute: 1.3 10*3/uL — ABNORMAL HIGH (ref 0.1–1.0)
Monocytes Relative: 11 %
Neutro Abs: 8.7 10*3/uL — ABNORMAL HIGH (ref 1.7–7.7)
Neutrophils Relative %: 71 %
Platelets: 242 10*3/uL (ref 150–400)
RBC: 5.24 MIL/uL (ref 4.22–5.81)
RDW: 14.2 % (ref 11.5–15.5)
WBC: 12.2 10*3/uL — ABNORMAL HIGH (ref 4.0–10.5)
nRBC: 0 % (ref 0.0–0.2)

## 2023-04-04 LAB — LIPID PANEL
Cholesterol: 122 mg/dL (ref 0–200)
HDL: 59 mg/dL (ref >40)
LDL Cholesterol: 52 mg/dL (ref 0–99)
Total CHOL/HDL Ratio: 2.1 RATIO
Triglycerides: 56 mg/dL (ref <150)
VLDL: 11 mg/dL (ref 0–40)

## 2023-04-04 LAB — PROCALCITONIN: Procalcitonin: 0.79 ng/mL

## 2023-04-04 LAB — HEMOGLOBIN A1C
Hgb A1c MFr Bld: 5.5 % (ref 4.8–5.6)
Mean Plasma Glucose: 111.15 mg/dL

## 2023-04-04 LAB — MRSA NEXT GEN BY PCR, NASAL: MRSA by PCR Next Gen: NOT DETECTED

## 2023-04-04 MED ORDER — MELATONIN 5 MG PO TABS
10.0000 mg | ORAL_TABLET | Freq: Every evening | ORAL | Status: DC | PRN
  Administered 2023-04-04 – 2023-04-07 (×4): 10 mg via ORAL
  Filled 2023-04-04 (×4): qty 2

## 2023-04-04 NOTE — Progress Notes (Signed)
Eeg rehooked, atrium notified

## 2023-04-04 NOTE — Procedures (Signed)
Patient Name: [name]  MRN: [id number]  Epilepsy Attending: Charlsie Quest  Referring Physician/Provider: Marjorie Smolder, NP  Duration: 04/03/2023 1527 to 04/04/2023 2031  Patient history: 68yo M with intermittent episodes of shaking getting eeg to evaluate for seizure  Level of alertness: Awake, asleep  AEDs during EEG study: VPA  Technical aspects: This EEG study was done with scalp electrodes positioned according to the 10-20 International system of electrode placement. Electrical activity was reviewed with band pass filter of 1-70Hz , sensitivity of 7 uV/mm, display speed of 14mm/sec with a 60Hz  notched filter applied as appropriate. EEG data were recorded continuously and digitally stored.  Video monitoring was available and reviewed as appropriate.  Description: The posterior dominant rhythm consists of 8 Hz activity of moderate voltage (25-35 uV) seen predominantly in posterior head regions, symmetric and reactive to eye opening and eye closing. Sleep was characterized by vertex waves, sleep spindles (12 to 14 Hz), maximal frontocentral region. EEG showed continuous 3 to 6 Hz theta-delta slowing in right hemisphere. Hyperventilation and photic stimulation were not performed.     ABNORMALITY - Continuous slow, right hemisphere  IMPRESSION: This study is suggestive of cortical dysfunction arising from right hemisphere, likely secondary to underlying stroke. No seizures or epileptiform discharges were seen throughout the recording.  Ammar Moffatt Annabelle Harman

## 2023-04-04 NOTE — Evaluation (Signed)
Physical Therapy Evaluation Patient Details Name: [name] MRN: [id number] DOB: 1956-05-30 Today's Date: 04/04/2023  History of Present Illness  Pt is a 67 y/o male presenting 7/18 from SNF with L sided weakness. MRI brain showed large evolving acute right MCA distribution infarct. PMH: CVA with residual spastic hemiplegia, HTN, HLD, cocaine use  Clinical Impression  Pt admitted with above diagnosis. Admitted from SNF; poor historian but pleasant and cooperative during evaluation. Ambulatory in March 2024 during previous admission however notes report pt has been bed bound and SNF?  Currently requires max assist +2 for bed mobility, and Max A +1 for seated balance. Reaching with RUE to facilitate forward lean, shows ability to utilize RLE well. Clonus with LLE, spastic extension, able to flex knee with deep pressure in quad and gentle sustained manual flexion. Pt currently with functional limitations due to the deficits listed below (see PT Problem List). Pt will benefit from acute skilled PT to increase their independence and safety with mobility to allow discharge.           Assistance Recommended at Discharge Frequent or constant Supervision/Assistance  If plan is discharge home, recommend the following:  Can travel by private vehicle  Two people to help with walking and/or transfers;Two people to help with bathing/dressing/bathroom;Assistance with cooking/housework;Direct supervision/assist for medications management;Direct supervision/assist for financial management;Assist for transportation   No    Equipment Recommendations None recommended by PT  Recommendations for Other Services       Functional Status Assessment Patient has had a recent decline in their functional status and demonstrates the ability to make significant improvements in function in a reasonable and predictable amount of time.     Precautions / Restrictions Precautions Precautions: Fall Restrictions Weight  Bearing Restrictions: No      Mobility  Bed Mobility Overal bed mobility: Needs Assistance Bed Mobility: Supine to Sit, Sit to Supine, Rolling Rolling: Max assist, +2 for physical assistance, +2 for safety/equipment   Supine to sit: Max assist, +2 for physical assistance, +2 for safety/equipment, HOB elevated Sit to supine: Max assist, +2 for physical assistance, +2 for safety/equipment   General bed mobility comments: Cued to grab bedrail with RUE w/ pt able to follow through and assist with pulling. able to move RLE to EOB though often times pulling back into bed; Assist with LLE to EOB and heavy assist to lift trunk to EOB. Cued to return to supine on elbow with guidance of trunk and BLE. Max A x 2 to roll to side for bed linen adjustment    Transfers                   General transfer comment: deferred due to heavy posterior lean    Ambulation/Gait                  Stairs            Wheelchair Mobility     Tilt Bed    Modified Rankin (Stroke Patients Only)       Balance Overall balance assessment: Needs assistance Sitting-balance support: Single extremity supported, Feet supported Sitting balance-Leahy Scale: Zero Sitting balance - Comments: Brief periods of Mod A but overall Max A with heavy posterior lean. cued to pull forward with handheld assist and pt often pushing backwards instead. cued to reach towards therapist hand below lap with pt able to lean forward at this time with brief Mod A. Inconsistent with following commands. Able to lean onto Rt elbow  with assist, still showing posterior push. Postural control: Posterior lean                                   Pertinent Vitals/Pain Pain Assessment Pain Assessment: No/denies pain Pain Intervention(s): Monitored during session    Home Living Family/patient expects to be discharged to:: Skilled nursing facility                   Additional Comments: Prior to March  2024 admission, pt was at home but has been at [facility] rehab since [place]. [facility]    Prior Function Prior Level of Function : Patient poor historian/Family not available             Mobility Comments: with March 2024 admission, pt was ambulating with Min A using RW; per chart, pt mostly bedbound at SNF ADLs Comments: In March 2024, pt was able to assist with ADLs though wife also assisting. Per chart, at SNF, pt able to self feed though unsure of other ADL needs/assist levels     Hand Dominance   Dominant Hand: Right    Extremity/Trunk Assessment   Upper Extremity Assessment Upper Extremity Assessment: Defer to OT evaluation LUE Deficits / Details: overall flaccid, reports sensation intact though did not respond to stimuli on LUE; PROM WFL LUE Sensation: decreased light touch;decreased proprioception LUE Coordination: decreased fine motor;decreased gross motor    Lower Extremity Assessment Lower Extremity Assessment: RLE deficits/detail;LLE deficits/detail;Difficult to assess due to impaired cognition RLE Deficits / Details: Rt knee extension 5/5 LLE Deficits / Details: Spastic extension. Able to manually relax with gentle sustained pressure for Lt knee flexion. Clonus with DTRs patella and achilles. Slight movement into adduction with VC. LLE Sensation: decreased light touch (Verbalizes that he has normal sensation however when vision occluded pt cannot identify when LLE is touched.)    Cervical / Trunk Assessment Cervical / Trunk Assessment: Normal  Communication   Communication: Receptive difficulties;Expressive difficulties  Cognition Arousal/Alertness: Awake/alert Behavior During Therapy: Flat affect Overall Cognitive Status: No family/caregiver present to determine baseline cognitive functioning Area of Impairment: Orientation, Attention, Memory, Following commands, Safety/judgement, Awareness, Problem solving                 Orientation Level: Disoriented to,  Time, Situation (able to recall July after reorientation at start of session; initially reported April) Current Attention Level: Sustained Memory: Decreased short-term memory Following Commands: Follows one step commands inconsistently, Follows one step commands with increased time Safety/Judgement: Decreased awareness of safety, Decreased awareness of deficits Awareness: Intellectual Problem Solving: Slow processing, Requires tactile cues, Requires verbal cues, Decreased initiation, Difficulty sequencing General Comments: Pleasant, flat affect, inconsistent following of commands and motor planning impairments; difficulty with initiation as well as extinction of activities (cued for LUE movement with pt performing on R UE- continued lifting R UE during session after moving on to other tasks). decreased insight into deficits        General Comments General comments (skin integrity, edema, etc.): EEG techs in to assess machine during session d/t software outage    Exercises General Exercises - Lower Extremity Long Arc Quad: Strengthening, Right, 10 reps, Seated Heel Slides: PROM, Left, 5 reps, Seated   Assessment/Plan    PT Assessment Patient needs continued PT services  PT Problem List Decreased strength;Decreased range of motion;Decreased activity tolerance;Decreased balance;Decreased mobility;Decreased coordination;Decreased cognition;Decreased knowledge of use of DME;Decreased safety awareness;Decreased knowledge of precautions;Impaired  sensation;Impaired tone       PT Treatment Interventions DME instruction;Functional mobility training;Therapeutic activities;Therapeutic exercise;Balance training;Neuromuscular re-education;Cognitive remediation;Patient/family education;Wheelchair mobility training    PT Goals (Current goals can be found in the Care Plan section)  Acute Rehab PT Goals Patient Stated Goal: Get some rest PT Goal Formulation: Patient unable to participate in goal  setting Time For Goal Achievement: 04/18/23 Potential to Achieve Goals: Fair    Frequency Min 2X/week     Co-evaluation PT/OT/SLP Co-Evaluation/Treatment: Yes Reason for Co-Treatment: Complexity of the patient's impairments (multi-system involvement);Necessary to address cognition/behavior during functional activity;For patient/therapist safety;To address functional/ADL transfers PT goals addressed during session: Mobility/safety with mobility;Balance;Strengthening/ROM OT goals addressed during session: ADL's and self-care;Strengthening/ROM       AM-PAC PT "6 Clicks" Mobility  Outcome Measure Help needed turning from your back to your side while in a flat bed without using bedrails?: A Lot Help needed moving from lying on your back to sitting on the side of a flat bed without using bedrails?: Total Help needed moving to and from a bed to a chair (including a wheelchair)?: Total Help needed standing up from a chair using your arms (e.g., wheelchair or bedside chair)?: Total Help needed to walk in hospital room?: Total Help needed climbing 3-5 steps with a railing? : Total 6 Click Score: 7    End of Session   Activity Tolerance: Patient tolerated treatment well Patient left: in bed;with bed alarm set;with call bell/phone within reach;with SCD's reapplied Nurse Communication: Mobility status PT Visit Diagnosis: Muscle weakness (generalized) (M62.81);Difficulty in walking, not elsewhere classified (R26.2);Other symptoms and signs involving the nervous system (R29.898);Hemiplegia and hemiparesis Hemiplegia - Right/Left: Left Hemiplegia - dominant/non-dominant:  (n/a) Hemiplegia - caused by: Cerebral infarction    Time: 4540-9811 PT Time Calculation (min) (ACUTE ONLY): 26 min   Charges:   PT Evaluation $PT Eval Moderate Complexity: 1 Mod   PT General Charges $$ ACUTE PT VISIT: 1 Visit         Kathlyn Sacramento, PT, DPT [facility]  Re[facility] Services Physical  Therapist Office: [phone] Website: [email or web address]   Berton Mount 04/04/2023, 11:00 AM

## 2023-04-04 NOTE — Evaluation (Signed)
Occupational Therapy Evaluation Patient Details Name: [name] MRN: [id number] DOB: 08-Dec-1955 Today's Date: 04/04/2023   History of Present Illness Pt is a 67 y/o male presenting from SNF with L sided weakness. MRI brain showed large evolving acute right MCA distribution infarct. PMH: CVA with residual spastic hemiplegia, HTN, HLD, cocaine use   Clinical Impression   PTA, pt admitted from SNF where pt is primarily bedbound per chart. During March 2024 admission, pt ambulatory with RW and Min A, as well as requiring moderate assist for ADLs. Pt presents now with significant weakness on L side, L inattention w/ vision to be further assessed, cognition and sitting balance. Pt requires Max A x 2 for all bed mobility and overall Max A for sitting balance due to heavy posterior bias. Pt with motor planning impairments and inconsistent command following during session. Will continue to follow acutely and recommend < 3 hours of therapy follow up per day at DC.     Recommendations for follow up therapy are one component of a multi-disciplinary discharge planning process, led by the attending physician.  Recommendations may be updated based on patient status, additional functional criteria and insurance authorization.   Assistance Recommended at Discharge Frequent or constant Supervision/Assistance  Patient can return home with the following Two people to help with walking and/or transfers;A lot of help with bathing/dressing/bathroom;Two people to help with bathing/dressing/bathroom    Functional Status Assessment  Patient has had a recent decline in their functional status and demonstrates the ability to make significant improvements in function in a reasonable and predictable amount of time.  Equipment Recommendations  Other (comment) (defer to SNF)    Recommendations for Other Services       Precautions / Restrictions Precautions Precautions: Fall Restrictions Weight Bearing  Restrictions: No      Mobility Bed Mobility Overal bed mobility: Needs Assistance Bed Mobility: Supine to Sit, Sit to Supine, Rolling Rolling: Max assist, +2 for physical assistance, +2 for safety/equipment   Supine to sit: Max assist, +2 for physical assistance, +2 for safety/equipment, HOB elevated Sit to supine: Max assist, +2 for physical assistance, +2 for safety/equipment   General bed mobility comments: Cued to grab bedrail with RUE w/ pt able to follow through and assist with pulling. able to move RLE to EOB though often times pulling back into bed; Assist with LLE to EOB and heavy assist to lift trunk to EOB. Cued to return to supine on elbow with guidance of trunk and BLE. Max A x 2 to roll to side for bed linen adjustment    Transfers                   General transfer comment: deferred due to heavy posterior lean      Balance Overall balance assessment: Needs assistance Sitting-balance support: Single extremity supported, Feet supported Sitting balance-Leahy Scale: Zero Sitting balance - Comments: Brief periods of Mod A but overall Max A with heavy posterior lean. cued to pull forward with handheld assist and pt often pushing backwards instead. cued to reach towards therapist hand below lap with pt able to lean forward at this time with brief Mod A Postural control: Posterior lean                                 ADL either performed or assessed with clinical judgement   ADL Overall ADL's : Needs assistance/impaired Eating/Feeding: NPO   Grooming:  Maximal assistance;Bed level   Upper Body Bathing: Maximal assistance   Lower Body Bathing: Total assistance;Bed level   Upper Body Dressing : Maximal assistance;Bed level   Lower Body Dressing: Total assistance;Bed level       Toileting- Clothing Manipulation and Hygiene: Total assistance;Bed level         General ADL Comments: Limited by significant L sided weakness, L inattention      Vision Ability to See in Adequate Light: 2 Moderately impaired Patient Visual Report: Other (comment) (L inattention) Vision Assessment?: Vision impaired- to be further tested in functional context Additional Comments: Pt with L inattention. with cues, pt able to scan to L side to locate therapist (subsequently puckering lips at therapist). difficult to fully assess with impaired cognition     Perception     Praxis      Pertinent Vitals/Pain Pain Assessment Pain Assessment: Faces Faces Pain Scale: No hurt Pain Intervention(s): Monitored during session     Hand Dominance Right   Extremity/Trunk Assessment Upper Extremity Assessment Upper Extremity Assessment: LUE deficits/detail LUE Deficits / Details: overall flaccid, reports sensation intact though did not respond to stimuli on LUE; PROM WFL LUE Sensation: decreased light touch;decreased proprioception LUE Coordination: decreased fine motor;decreased gross motor   Lower Extremity Assessment Lower Extremity Assessment: Defer to PT evaluation   Cervical / Trunk Assessment Cervical / Trunk Assessment: Normal   Communication Communication Communication: Receptive difficulties;Expressive difficulties   Cognition Arousal/Alertness: Awake/alert Behavior During Therapy: Flat affect Overall Cognitive Status: No family/caregiver present to determine baseline cognitive functioning Area of Impairment: Orientation, Attention, Memory, Following commands, Safety/judgement, Awareness, Problem solving                 Orientation Level: Disoriented to, Time, Situation (able to recall July after reorientation at start of session; initially reported April) Current Attention Level: Sustained Memory: Decreased short-term memory Following Commands: Follows one step commands inconsistently, Follows one step commands with increased time Safety/Judgement: Decreased awareness of safety, Decreased awareness of deficits Awareness:  Intellectual Problem Solving: Slow processing, Requires tactile cues, Requires verbal cues, Decreased initiation, Difficulty sequencing General Comments: pleasant, flat affect, inconsistent following of commands and motor planning impairments; difficulty with initiation as well as extinction of activities (cued for LUE movement with pt performing on R UE- continued lifting R UE during session after moving on to other tasks). decreased insight into deficits     General Comments  EEG techs in to assess machine during session d/t software outage    Exercises     Shoulder Instructions      Home Living Family/patient expects to be discharged to:: Skilled nursing facility                                 Additional Comments: Prior to March 2024 admission, pt was at home but has been at [facility] rehab since DC      Prior Functioning/Environment Prior Level of Function : Patient poor historian/Family not available             Mobility Comments: with March 2024 admission, pt was ambulating with Min A using RW; per chart, pt mostly bedbound at SNF ADLs Comments: In March 2024, pt was able to assist with ADLs though wife also assisting. Per chart, at SNF, pt able to self feed though unsure of other ADL needs/assist levels        OT Problem List: Decreased strength;Decreased activity tolerance;Impaired  balance (sitting and/or standing);Impaired vision/perception;Decreased coordination;Decreased cognition;Decreased safety awareness;Impaired UE functional use;Impaired sensation;Impaired tone      OT Treatment/Interventions: Self-care/ADL training;Therapeutic exercise;Energy conservation;DME and/or AE instruction;Therapeutic activities;Patient/family education    OT Goals(Current goals can be found in the care plan section) Acute Rehab OT Goals Patient Stated Goal: lay back down, watch sports OT Goal Formulation: With patient Time For Goal Achievement: 04/18/23 Potential to Achieve  Goals: Fair  OT Frequency: Min 2X/week    Co-evaluation PT/OT/SLP Co-Evaluation/Treatment: Yes Reason for Co-Treatment: Complexity of the patient's impairments (multi-system involvement);Necessary to address cognition/behavior during functional activity;For patient/therapist safety;To address functional/ADL transfers   OT goals addressed during session: ADL's and self-care;Strengthening/ROM      AM-PAC OT "6 Clicks" Daily Activity     Outcome Measure Help from another person eating meals?: Total Help from another person taking care of personal grooming?: A Lot Help from another person toileting, which includes using toliet, bedpan, or urinal?: Total Help from another person bathing (including washing, rinsing, drying)?: Total Help from another person to put on and taking off regular upper body clothing?: A Lot Help from another person to put on and taking off regular lower body clothing?: Total 6 Click Score: 8   End of Session Nurse Communication: Mobility status  Activity Tolerance: Patient tolerated treatment well Patient left: in bed;with call bell/phone within reach;with bed alarm set  OT Visit Diagnosis: Unsteadiness on feet (R26.81);Other abnormalities of gait and mobility (R26.89);Muscle weakness (generalized) (M62.81)                Time: 1610-9604 OT Time Calculation (min): 26 min Charges:  OT General Charges $OT Visit: 1 Visit OT Evaluation $OT Eval Moderate Complexity: 1 Mod  Bradd Canary, OTR/L Acute Rehab Services Office: [phone]   Lorre Munroe 04/04/2023, 10:03 AM

## 2023-04-04 NOTE — Progress Notes (Addendum)
STROKE TEAM PROGRESS NOTE   BRIEF HPI [name] is a 67 y.o. male with history of two strokes, vascular dementia, hypertension and hyperlipidemia who presents from his facility today with new onset left sided weakness, right gaze and left sided facial droop.  On arrival to the ED, he was noted to have left sided weakness, left facial droop and intermittent shaking movements of the right upper and lower extremities. NIH: 19 LKW: 0930 TNK given?: no, outside of window IR Thrombectomy? No, poor baseline mRS Modified Rankin Scale: 5  SIGNIFICANT HOSPITAL EVENTS CT-scan of the brain: loss of grey-white differentiation in right MCA territory, hyperdense right M2 MCA branch, ASPECTS 3   CTA head and neck: Right M2 MCA occlusion   MRI brain:  large evolving Rt MCA infarct  INTERIM HISTORY/SUBJECTIVE Patient is lying in bed  awake and cooperative with rt gaze deviation, dense left hemiplegia and sensory loss Vital signs stable  OBJECTIVE  CBC    Component Value Date/Time   WBC 12.2 (H) 04/04/2023 1240   RBC 5.24 04/04/2023 1240   HGB 13.8 04/04/2023 1240   HCT 43.0 04/04/2023 1240   PLT 242 04/04/2023 1240   MCV 82.1 04/04/2023 1240   MCH 26.3 04/04/2023 1240   MCHC 32.1 04/04/2023 1240   RDW 14.2 04/04/2023 1240   LYMPHSABS 2.1 04/04/2023 1240   MONOABS 1.3 (H) 04/04/2023 1240   EOSABS 0.0 04/04/2023 1240   BASOSABS 0.0 04/04/2023 1240    BMET    Component Value Date/Time   NA 141 04/03/2023 1441   K 3.7 04/03/2023 1441   CL 105 04/03/2023 1441   CO2 19 (L) 04/03/2023 1435   GLUCOSE 117 (H) 04/03/2023 1441   BUN 13 04/03/2023 1441   CREATININE 1.20 04/03/2023 1441   CALCIUM 8.4 (L) 04/03/2023 1435   GFRNONAA >60 04/03/2023 1435    IMAGING past 24 hours ECHOCARDIOGRAM COMPLETE  Result Date: 04/04/2023    ECHOCARDIOGRAM REPORT   Patient Name:   [name] Date of Exam: 04/04/2023 Medical Rec #:  [id number]     Height:       69.0 in Accession #:    [id number]     Weight:       165.3 lb Date of Birth:  March 03, 1956     BSA:          1.906 m Patient Age:    66 years      BP:           141/82 mmHg Patient Gender: M             HR:           107 bpm. Exam Location:  Inpatient Procedure: 2D Echo, Cardiac Doppler and Color Doppler Indications:    Stroke I63.9  History:        Patient has prior history of Echocardiogram examinations, most                 recent 08/27/2019. Risk Factors:Hypertension.  Sonographer:    Harriette Bouillon Referring Phys: [id number] ALEXANDER B MELVIN IMPRESSIONS  1. Left ventricular ejection fraction, by estimation, is 70 to 75%. The left ventricle has hyperdynamic function. The left ventricle has no regional wall motion abnormalities. Left ventricular diastolic parameters are consistent with Grade I diastolic dysfunction (impaired relaxation).  2. Right ventricular systolic function is normal. The right ventricular size is normal.  3. The mitral valve is normal in structure. No evidence of mitral valve regurgitation. No evidence of  mitral stenosis.  4. The aortic valve is tricuspid. There is mild calcification of the aortic valve. Aortic valve regurgitation is not visualized. No aortic stenosis is present.  5. The inferior vena cava is normal in size with greater than 50% respiratory variability, suggesting right atrial pressure of 3 mmHg. FINDINGS  Left Ventricle: Left ventricular ejection fraction, by estimation, is 70 to 75%. The left ventricle has hyperdynamic function. The left ventricle has no regional wall motion abnormalities. The left ventricular internal cavity size was normal in size. There is no left ventricular hypertrophy. Left ventricular diastolic parameters are consistent with Grade I diastolic dysfunction (impaired relaxation). Right Ventricle: The right ventricular size is normal. No increase in right ventricular wall thickness. Right ventricular systolic function is normal. Left Atrium: Left atrial size was normal in size. Right Atrium:  Right atrial size was normal in size. Pericardium: There is no evidence of pericardial effusion. Mitral Valve: The mitral valve is normal in structure. No evidence of mitral valve regurgitation. No evidence of mitral valve stenosis. Tricuspid Valve: The tricuspid valve is normal in structure. Tricuspid valve regurgitation is not demonstrated. No evidence of tricuspid stenosis. Aortic Valve: The aortic valve is tricuspid. There is mild calcification of the aortic valve. Aortic valve regurgitation is not visualized. No aortic stenosis is present. Pulmonic Valve: The pulmonic valve was not well visualized. Pulmonic valve regurgitation is not visualized. No evidence of pulmonic stenosis. Aorta: The aortic root is normal in size and structure. Venous: The inferior vena cava is normal in size with greater than 50% respiratory variability, suggesting right atrial pressure of 3 mmHg. IAS/Shunts: No atrial level shunt detected by color flow Doppler.  LEFT VENTRICLE PLAX 2D LVIDd:         4.60 cm   Diastology LVIDs:         3.60 cm   LV e' medial:    9.79 cm/s LV PW:         0.90 cm   LV E/e' medial:  7.9 LV IVS:        0.90 cm   LV e' lateral:   9.48 cm/s LVOT diam:     2.00 cm   LV E/e' lateral: 8.1 LV SV:         50 LV SV Index:   26 LVOT Area:     3.14 cm  RIGHT VENTRICLE             IVC RV S prime:     17.70 cm/s  IVC diam: 1.40 cm TAPSE (M-mode): 1.3 cm LEFT ATRIUM             Index        RIGHT ATRIUM          Index LA diam:        2.50 cm 1.31 cm/m   RA Area:     7.03 cm LA Vol (A2C):   20.6 ml 10.81 ml/m  RA Volume:   11.10 ml 5.83 ml/m LA Vol (A4C):   19.9 ml 10.44 ml/m LA Biplane Vol: 20.8 ml 10.92 ml/m  AORTIC VALVE LVOT Vmax:   112.00 cm/s LVOT Vmean:  71.200 cm/s LVOT VTI:    0.160 m  AORTA Ao Root diam: 3.50 cm Ao Asc diam:  3.10 cm MITRAL VALVE MV Area (PHT): 6.54 cm    SHUNTS MV Decel Time: 116 msec    Systemic VTI:  0.16 m MV E velocity: 77.10 cm/s  Systemic Diam: 2.00 cm MV A velocity:  94.50 cm/s MV  E/A ratio:  0.82 Arvilla Meres MD Electronically signed by Arvilla Meres MD Signature Date/Time: 04/04/2023/11:23:16 AM    Final    MR BRAIN WO CONTRAST  Result Date: 04/03/2023 CLINICAL DATA:  Follow-up examination for stroke. EXAM: MRI HEAD WITHOUT CONTRAST TECHNIQUE: Multiplanar, multiecho pulse sequences of the brain and surrounding structures were obtained without intravenous contrast. COMPARISON:  Prior CTs from earlier the same day. FINDINGS: Brain: Examination is technically limited due to the patient's inability to tolerate the full length of the study. Diffusion-weighted sequence, FLAIR sequence, and SWI sequences only were performed. Additionally, provided images are severely degraded by motion artifact. Large confluent area of restricted diffusion involving the right cerebral hemisphere is seen, consistent with a large evolving acute right MCA distribution infarct. Nearly the entirety of the right MCA distribution appears to be involved. No significant regional mass effect at this time. No visible associated hemorrhage, although evaluation fairly limited due to extensive motion artifact on this exam. No other visible acute or subacute ischemia. Chronic infarcts involving the left ACA and right PCA distributions are noted. Chronic bilateral cerebellar infarcts noted as well. No visible mass lesion. No midline shift or hydrocephalus. No visible extra-axial fluid collection. Vascular: Not well assessed on this limited exam. Skull and upper cervical spine: Not well assessed on this limited exam. Sinuses/Orbits: Not well assessed on this limited exam. Other: None. IMPRESSION: 1. Technically limited and truncated motion degraded exam. 2. Large evolving acute right MCA distribution infarct. No significant regional mass effect at this time. Please note that evaluation for potential associated blood products is fairly limited on this exam. Continued follow-up by CT recommended. 3. Chronic left ACA, right  PCA, and bilateral cerebellar infarcts. Electronically Signed   By: Rise Mu M.D.   On: 04/03/2023 22:07   DG CHEST PORT 1 VIEW  Result Date: 04/03/2023 CLINICAL DATA:  Fever EXAM: PORTABLE CHEST 1 VIEW COMPARISON:  08/26/2019 FINDINGS: The patient is rotated to the right on today's radiograph, reducing diagnostic sensitivity and specificity. Accounting for the degree of rightward rotation, which is substantial, cardiac and mediastinal contours are thought to be within normal limits. The lungs appear clear. No blunting of the costophrenic angles. Thoracic spondylosis noted. IMPRESSION: 1. No acute findings. 2. Thoracic spondylosis. Electronically Signed   By: Gaylyn Rong M.D.   On: 04/03/2023 17:55   DG Pelvis Portable  Result Date: 04/03/2023 CLINICAL DATA:  MRI clearance. EXAM: PORTABLE PELVIS 1-2 VIEWS COMPARISON:  None Available. FINDINGS: There is no evidence of pelvic fracture or diastasis. No pelvic bone lesions are seen. Contrast is noted in urinary bladder. Rounded metallic densities are seen overlying right hip region and sacrum which reportedly are attached to the patient's sweat pants. IMPRESSION: Rounded metallic densities are seen over the pelvis which reportedly are external objects. No other definite radiopaque foreign body is noted. Electronically Signed   By: Lupita Raider M.D.   On: 04/03/2023 17:39   CT ANGIO HEAD NECK W WO CM (CODE STROKE)  Result Date: 04/03/2023 CLINICAL DATA:  Neuro deficit, acute, stroke suspected EXAM: CT ANGIOGRAPHY HEAD AND NECK WITH AND WITHOUT CONTRAST TECHNIQUE: Multidetector CT imaging of the head and neck was performed using the standard protocol during bolus administration of intravenous contrast. Multiplanar CT image reconstructions and MIPs were obtained to evaluate the vascular anatomy. Carotid stenosis measurements (when applicable) are obtained utilizing NASCET criteria, using the distal internal carotid diameter as the  denominator. RADIATION DOSE REDUCTION: This exam was  performed according to the departmental dose-optimization program which includes automated exposure control, adjustment of the mA and/or kV according to patient size and/or use of iterative reconstruction technique. CONTRAST:  75mL OMNIPAQUE IOHEXOL 350 MG/ML SOLN COMPARISON:  Same day CT head. FINDINGS: CTA NECK FINDINGS Aortic arch: Great vessel origins are patent without significant stenosis. Atherosclerosis. Right carotid system: No evidence of dissection, stenosis (50% or greater), or occlusion. Left carotid system: No evidence of dissection, stenosis (50% or greater), or occlusion. Carotid bifurcation atherosclerosis. Vertebral arteries: Moderate right vertebral artery origin stenosis. Severe left vertebral artery origin stenosis. Right dominant. Skeleton: No acute abnormality. Other neck: No acute abnormality. Upper chest: Visualized lung apices are clear. Paraseptal emphysema. Review of the MIP images confirms the above findings CTA HEAD FINDINGS Anterior circulation: Calcific atherosclerosis of the internal carotid arteries. Critical stenosis versus occlusion of the right paraclinoid ICA. Asymmetric opacification of the right M1 MCA. Occluded right M2 MCA vessel. Left MCA and ACAs are patent. Right A1 ACAs not opacified. Distal right ACA stenosis. Severe left M2 MCA stenosis. Posterior circulation: Bilateral intradural vertebral arteries, basilar artery and bilateral posterior cerebral arteries are patent. Severe right P2 PCA stenosis. Venous sinuses: As permitted by contrast timing, patent. Review of the MIP images confirms the above findings IMPRESSION: 1. Occluded right M2 MCA branch. 2. Critical stenosis versus occlusion of the right paraclinoid ICA. Asymmetrically diminished right M1 MCA opacification. 3. Severe left and moderate right vertebral artery origin stenosis. 4. Severe right P2 PCA stenosis with irregular opacification. 5. Severe left M2  MCA stenosis. 6. Aortic Atherosclerosis (ICD10-I70.0) and Emphysema (ICD10-J43.9). Preliminary CTA findings discussed with Dr. Derry Lory via telephone at 3 p.m. on 04/03/2023. Electronically Signed   By: Feliberto Harts M.D.   On: 04/03/2023 15:37   CT HEAD CODE STROKE WO CONTRAST  Result Date: 04/03/2023 CLINICAL DATA:  Code stroke. Neuro deficit, acute, stroke suspected. EXAM: CT HEAD WITHOUT CONTRAST TECHNIQUE: Contiguous axial images were obtained from the base of the skull through the vertex without intravenous contrast. RADIATION DOSE REDUCTION: This exam was performed according to the departmental dose-optimization program which includes automated exposure control, adjustment of the mA and/or kV according to patient size and/or use of iterative reconstruction technique. COMPARISON:  CT head 08/27/2019. FINDINGS: Brain: Multiple remote infarcts in the cerebellum. Remote right PCA territory and left ACA territory infarcts. Motion limits assessment but there appear to be areas of edema and loss of gray differentiation involving the right basal ganglia, insula and overlying frontal lobe. No evidence of acute infarct, mass lesion, midline shift or hydrocephalus. Vascular: Possible hyperdense right M2 MCA. Skull: No acute fracture. Sinuses/Orbits: No acute abnormality. ASPECTS [facility] Stroke Program Early CT Score) - Ganglionic level infarction (caudate, lentiform nuclei, internal capsule, insula, M1-M3 cortex): 1 - Supraganglionic infarction (M4-M6 cortex): 2 Total score (0-10 with 10 being normal): 3 IMPRESSION: 1. Findings concerning for acute/recent right MCA territory infarct with loss of gray-white involving the right basal ganglia, insula and frontal lobe. ASPECTS is 3. 2. Possible hyperdense right M2 MCA branch. Recommend correlation with forthcoming CTA. 3. Multiple remote infarcts, detailed above. Code stroke imaging results were communicated on 04/03/2023 at 3:00 pm to provider Dr. Derry Lory Via  telephone, who verbally acknowledged these results. Electronically Signed   By: Feliberto Harts M.D.   On: 04/03/2023 15:06    Vitals:   04/04/23 0751 04/04/23 1129 04/04/23 1200 04/04/23 1357  BP: (!) 141/82 (!) 149/95    Pulse: (!) 108 (!) 104  Resp:   19 18  Temp: 98.2 F (36.8 C) 99.4 F (37.4 C)    TempSrc: Oral Oral    SpO2: 100% 97%    Weight:      Height:         PHYSICAL EXAM General:  Alert, well-nourished, well-developed patient in no acute distress Psych:  Mood and affect appropriate for situation CV: Regular rate and rhythm on monitor Respiratory:  Regular, unlabored respirations on room air GI: Abdomen soft and nontender   NEURO:  Mental Status: AA, oriented to person and place, slow to respond, poor attention.  Speech/Language: speech is slightly dysarthric. Naming, repetition, fluency, and comprehension intact.  Cranial Nerves:  II: PERRL. Left hemianopsia  III, IV, VI: Right gaze deviation, does cross midline and track.  V: Sensation is intact to light touch and symmetrical to face.  EPP:IRJJ facial droop VIII: hearing intact to voice. IX, X: Palate elevates symmetrically. Phonation is normal.  OA:[id number] shrug decreased on left. XII: tongue is midline without fasciculations. Motor: Left hemiplegia, no LLE movement, slight wd to lue.  Tone: is normal and bulk is normal Sensation- decreased to left leg Coordination: FTN intact bilaterally, HKS: no ataxia in BLE.No drift.  Gait- deferred  NIHSS:  1a Level of Conscious.: 0 1b LOC Questions: 1 1c LOC Commands: 0 2 Best Gaze: 2 3 Visual: 2 4 Facial Palsy: 1 5a Motor Arm - left: 3 5b Motor Arm - Right:0  6a Motor Leg - Left: 4 6b Motor Leg - Right: 0 7 Limb Ataxia: 0 8 Sensory: 1 9 Best Language: 0 10 Dysarthria: 1 11 Extinct. and Inatten.: 0 TOTAL: 15   ASSESSMENT/PLAN  Acute Ischemic Infarct:  right MCA territory Etiology:  large vessel disease terminal RICA occlusion versus high  grade stenosis Code Stroke CT head: Concerning for acute right MCA territory infarct.  ASPECTS 3.   CTA head & neck: Occluded right M2 MCA branch. Critical stenosis versus occlusion of right paraclinoid ICA.   MRI: Large evolving acute right MCA distribution infarct.   2D Echo: LVEF 70-75%, No LVH, Grade 1 diastolic dysfunction LDL 52 HgbA1c 5.5  VTE prophylaxis - SCDs aspirin 81 mg daily prior to admission, now on aspirin 81 mg daily and clopidogrel 75 mg daily for 3 months due to intracranial occlusion and then plavix alone.  Therapy recommendations:  SNF Disposition:  pending  Seizure activity Seizure Precautions Continue LTM EEG until tomorrow AM Will d/c if negative No AEDs at this time, can add if seizure seen    Hx of Stroke/TIA 2020: large right occipital and medial temporal lobe and thalamic infarct. CTA showed right PCA occlusion, severe bilateral M2 stenosis. UDS positive for cocaine. Started on aspirin and plavix, then aspirin 325mg , CIR for 9 days, then transferred to SNF.  2014: left ACA occlusion. UDS positive for marijuana.   Hypertension Home meds:  amlodipine 10mg , lisinopril 30mg  Blood Pressure Goal: BP less than 220/110. Permissive hypertension with return to normotensive in 24-48 hours. Avoid hypotension.   Hyperlipidemia Home meds:  lipitor 40mg , resumed in hospital LDL No results found for requested labs within last 1095 days., goal < 70 Continue statin at discharge  Diabetes type II, pre-diabetic Home meds:  none HgbA1c 5.5, goal < 7.0 CBGs SSI Recommend close follow-up with PCP   Tobacco Abuse Former smoker  Substance Abuse Patient previously used cocaine UDS negative   Other Active Problems Leukocytosis and fever WBC 12.3 -> 12.2 CXR: no acute findings.  UA: negative  Blood cultures pending Resp Panel: negative ProCal: pending  Hospital day # 0   Pt seen by Neuro NP/APP and later by MD. Note/plan to be edited by MD as needed.     Lynnae January, DNP, AGACNP-BC [facility] Please use AMION for contact information & EPIC for messaging.  STROKE MD NOTE :  I have personally obtained history,examined this patient, reviewed notes, independently viewed imaging studies, participated in medical decision making and plan of care.ROS completed by me personally and pertinent positives fully documented  I have made any additions or clarifications directly to the above note. Agree with note above.  He presented with left hemiplegia from a large Rt MCA infarct from Rt ICA occlusion outside TNK window with poor functional baseline and large core hence not a candidate for intervention. Prognosis is poor. Continue supportive care. Aspirin and plavix and aggressive risk factor modification.no family at bedside.Pt may need PEG tube discussion versus palliative care d/w family. Stroke team will sign off. Call for questions. I have spent a total of  50  minutes with the patient reviewing hospital notes,  test results, labs and examining the patient as well as establishing an assessment and plan that was discussed personally with the patient.  > 50% of time was spent in direct patient care.d/w Dr Vonzell Schlatter, MD Medical Director [facility] Pager: [phone] 04/04/2023 2:57 PM    To contact Stroke Continuity provider, please refer to [email or web address]. After hours, contact General Neurology

## 2023-04-04 NOTE — Plan of Care (Signed)
  Problem: Ischemic Stroke/TIA Tissue Perfusion: Goal: Complications of ischemic stroke/TIA will be minimized Outcome: Progressing   Problem: Coping: Goal: Will identify appropriate support needs Outcome: Progressing   Problem: Health Behavior/Discharge Planning: Goal: Goals will be collaboratively established with patient/family Outcome: Progressing   Problem: Nutrition: Goal: Risk of aspiration will decrease Outcome: Progressing Goal: Dietary intake will improve Outcome: Progressing   Problem: Education: Goal: Knowledge of General Education information will improve Description: Including pain rating scale, medication(s)/side effects and non-pharmacologic comfort measures Outcome: Progressing   Problem: Clinical Measurements: Goal: Respiratory complications will improve Outcome: Progressing

## 2023-04-04 NOTE — Progress Notes (Signed)
[facility]                                                                               Pleas Carneal, is a 67 y.o. male, DOB - 30-May-1956, ZOX:[id number] Admit date - 04/03/2023    Outpatient Primary MD for the patient is Clovis [name], L.August Saucer, MD  LOS - 0  days    Brief summary     67 y.o. male with medical history significant of stroke with residual spastic hemiplegia, hypertension, hyperlipidemia, cocaine use presenting after concern for new neurologic deficit at nursing facility. Patient at baseline is reportedly bedbound but can feed himself, is poorly verbal at baseline. These symptoms stem from previous significant CVA. Staff at facility noticed increased weakness on the left side. Patient seen by neurology on arrival as code stroke and determined to be outside the [facility] window. Further evaluation showed patient was a poor candidate for any interventional thrombectomy.   He was found to have  Large evolving acute right MCA distribution infarct. No significant regional mass effect at this time.     Assessment & Plan    Assessment and Plan: Acute right MCA infarct in the setting of old infarcts.  Rule out seizures. EEG ordered.  CTA head neck with multiple stenosis  Neurology on board. And awaiting recommendations.  Check echocardiogram.  Get A1c, lipid panel .  Therapy and SLP evaluations ordered.     Fever and leukocytosis 101.3 t max. Tachycardic.  Blood cultures ordered.  UA and CXR is negative.  Recheck cbc and pro calcitonin today.  COVID PCR is negative.  Respiratory panel is negative.    Hyperlipidemia Lipid panel ordered and pending.    Hypertension;  Allow for permissive HTN (systolic < 220 and diastolic < 120)   Tachycardia; unclear etiology.  No fever this morning.    Estimated body mass index is 24.42 kg/m as calculated from the following:   Height as of this encounter: 5\' 9"  (1.753 m).   Weight as of this encounter: 75  kg.  Code Status: full code.  DVT Prophylaxis:  SCD's Start: 04/03/23 1640   Level of Care: Level of care: Telemetry Medical Family Communication: none at bedside.   Disposition Plan:     Remains inpatient appropriate:  pending further work up.   Procedures:  Echocardiogram.  MRI brain.   Consultants:   Neurology.   Antimicrobials:   Anti-infectives (From admission, onward)    None        Medications  Scheduled Meds:  atorvastatin  40 mg Oral QHS   divalproex  125 mg Oral BID   PARoxetine  15 mg Oral QHS   Continuous Infusions: PRN Meds:.acetaminophen **OR** acetaminophen (TYLENOL) oral liquid 160 mg/5 mL **OR** acetaminophen, melatonin, senna-docusate    Subjective:   Trase Bunda was seen and examined today.  Answering simple questions.   Objective:   Vitals:   04/04/23 0335 04/04/23 0751 04/04/23 1129 04/04/23 1200  BP: (!) 145/84 (!) 141/82 (!) 149/95   Pulse: (!) 126 (!) 108 (!) 104   Resp: 18   19  Temp: 100 F (37.8 C) 98.2 F (36.8 C) 99.4 F (37.4 C)  TempSrc: Oral Oral Oral   SpO2: 100% 100% 97%   Weight:      Height:        Intake/Output Summary (Last 24 hours) at 04/04/2023 1251 Last data filed at 04/03/2023 2101 Gross per 24 hour  Intake 1520 ml  Output --  Net 1520 ml   Filed Weights   04/03/23 1400 04/03/23 1516  Weight: 75.6 kg 75 kg     Exam General exam: Appears calm and comfortable  Respiratory system: Clear to auscultation. Respiratory effort normal. Cardiovascular system: S1 & S2 heard, RRR.  Gastrointestinal system: Abdomen is nondistended, soft and nontender.  Central nervous system: Alert , answering some questions, hemiparesis and neglect.  Extremities: no edema.  Skin: No rashes,  Psychiatry:  Mood & affect appropriate.     Data Reviewed:  I have personally reviewed following labs and imaging studies   CBC Lab Results  Component Value Date   WBC 12.3 (H) 04/03/2023   RBC 4.90 04/03/2023   HGB 13.9  04/03/2023   HCT 41.0 04/03/2023   MCV 85.3 04/03/2023   MCH 26.7 04/03/2023   PLT 221 04/03/2023   MCHC 31.3 04/03/2023   RDW 14.1 04/03/2023   LYMPHSABS 0.6 (L) 04/03/2023   MONOABS 0.6 04/03/2023   EOSABS 0.0 04/03/2023   BASOSABS 0.0 04/03/2023     Last metabolic panel Lab Results  Component Value Date   NA 141 04/03/2023   K 3.7 04/03/2023   CL 105 04/03/2023   CO2 19 (L) 04/03/2023   BUN 13 04/03/2023   CREATININE 1.20 04/03/2023   GLUCOSE 117 (H) 04/03/2023   GFRNONAA >60 04/03/2023   GFRAA >60 09/16/2019   CALCIUM 8.4 (L) 04/03/2023   PROT 6.8 04/03/2023   ALBUMIN 3.4 (L) 04/03/2023   BILITOT 0.6 04/03/2023   ALKPHOS 124 04/03/2023   AST 38 04/03/2023   ALT 38 04/03/2023   ANIONGAP 14 04/03/2023    CBG (last 3)  Recent Labs    04/03/23 1435  GLUCAP 116*      Coagulation Profile: Recent Labs  Lab 04/03/23 1435  INR 1.2     Radiology Studies: ECHOCARDIOGRAM COMPLETE  Result Date: 04/04/2023    ECHOCARDIOGRAM REPORT   Patient Name:   [name] Date of Exam: 04/04/2023 Medical Rec #:  [id number]     Height:       69.0 in Accession #:    [id number]    Weight:       165.3 lb Date of Birth:  1955/09/17     BSA:          1.906 m Patient Age:    66 years      BP:           141/82 mmHg Patient Gender: M             HR:           107 bpm. Exam Location:  Inpatient Procedure: 2D Echo, Cardiac Doppler and Color Doppler Indications:    Stroke I63.9  History:        Patient has prior history of Echocardiogram examinations, most                 recent 08/27/2019. Risk Factors:Hypertension.  Sonographer:    Harriette Bouillon Referring Phys: [id number] ALEXANDER B MELVIN IMPRESSIONS  1. Left ventricular ejection fraction, by estimation, is 70 to 75%. The left ventricle has hyperdynamic function. The left ventricle has no regional wall motion abnormalities. Left ventricular diastolic  parameters are consistent with Grade I diastolic dysfunction (impaired relaxation).  2. Right  ventricular systolic function is normal. The right ventricular size is normal.  3. The mitral valve is normal in structure. No evidence of mitral valve regurgitation. No evidence of mitral stenosis.  4. The aortic valve is tricuspid. There is mild calcification of the aortic valve. Aortic valve regurgitation is not visualized. No aortic stenosis is present.  5. The inferior vena cava is normal in size with greater than 50% respiratory variability, suggesting right atrial pressure of 3 mmHg. FINDINGS  Left Ventricle: Left ventricular ejection fraction, by estimation, is 70 to 75%. The left ventricle has hyperdynamic function. The left ventricle has no regional wall motion abnormalities. The left ventricular internal cavity size was normal in size. There is no left ventricular hypertrophy. Left ventricular diastolic parameters are consistent with Grade I diastolic dysfunction (impaired relaxation). Right Ventricle: The right ventricular size is normal. No increase in right ventricular wall thickness. Right ventricular systolic function is normal. Left Atrium: Left atrial size was normal in size. Right Atrium: Right atrial size was normal in size. Pericardium: There is no evidence of pericardial effusion. Mitral Valve: The mitral valve is normal in structure. No evidence of mitral valve regurgitation. No evidence of mitral valve stenosis. Tricuspid Valve: The tricuspid valve is normal in structure. Tricuspid valve regurgitation is not demonstrated. No evidence of tricuspid stenosis. Aortic Valve: The aortic valve is tricuspid. There is mild calcification of the aortic valve. Aortic valve regurgitation is not visualized. No aortic stenosis is present. Pulmonic Valve: The pulmonic valve was not well visualized. Pulmonic valve regurgitation is not visualized. No evidence of pulmonic stenosis. Aorta: The aortic root is normal in size and structure. Venous: The inferior vena cava is normal in size with greater than 50%  respiratory variability, suggesting right atrial pressure of 3 mmHg. IAS/Shunts: No atrial level shunt detected by color flow Doppler.  LEFT VENTRICLE PLAX 2D LVIDd:         4.60 cm   Diastology LVIDs:         3.60 cm   LV e' medial:    9.79 cm/s LV PW:         0.90 cm   LV E/e' medial:  7.9 LV IVS:        0.90 cm   LV e' lateral:   9.48 cm/s LVOT diam:     2.00 cm   LV E/e' lateral: 8.1 LV SV:         50 LV SV Index:   26 LVOT Area:     3.14 cm  RIGHT VENTRICLE             IVC RV S prime:     17.70 cm/s  IVC diam: 1.40 cm TAPSE (M-mode): 1.3 cm LEFT ATRIUM             Index        RIGHT ATRIUM          Index LA diam:        2.50 cm 1.31 cm/m   RA Area:     7.03 cm LA Vol (A2C):   20.6 ml 10.81 ml/m  RA Volume:   11.10 ml 5.83 ml/m LA Vol (A4C):   19.9 ml 10.44 ml/m LA Biplane Vol: 20.8 ml 10.92 ml/m  AORTIC VALVE LVOT Vmax:   112.00 cm/s LVOT Vmean:  71.200 cm/s LVOT VTI:    0.160 m  AORTA Ao Root diam: 3.50 cm  Ao Asc diam:  3.10 cm MITRAL VALVE MV Area (PHT): 6.54 cm    SHUNTS MV Decel Time: 116 msec    Systemic VTI:  0.16 m MV E velocity: 77.10 cm/s  Systemic Diam: 2.00 cm MV A velocity: 94.50 cm/s MV E/A ratio:  0.82 Arvilla Meres MD Electronically signed by Arvilla Meres MD Signature Date/Time: 04/04/2023/11:23:16 AM    Final    MR BRAIN WO CONTRAST  Result Date: 04/03/2023 CLINICAL DATA:  Follow-up examination for stroke. EXAM: MRI HEAD WITHOUT CONTRAST TECHNIQUE: Multiplanar, multiecho pulse sequences of the brain and surrounding structures were obtained without intravenous contrast. COMPARISON:  Prior CTs from earlier the same day. FINDINGS: Brain: Examination is technically limited due to the patient's inability to tolerate the full length of the study. Diffusion-weighted sequence, FLAIR sequence, and SWI sequences only were performed. Additionally, provided images are severely degraded by motion artifact. Large confluent area of restricted diffusion involving the right cerebral hemisphere  is seen, consistent with a large evolving acute right MCA distribution infarct. Nearly the entirety of the right MCA distribution appears to be involved. No significant regional mass effect at this time. No visible associated hemorrhage, although evaluation fairly limited due to extensive motion artifact on this exam. No other visible acute or subacute ischemia. Chronic infarcts involving the left ACA and right PCA distributions are noted. Chronic bilateral cerebellar infarcts noted as well. No visible mass lesion. No midline shift or hydrocephalus. No visible extra-axial fluid collection. Vascular: Not well assessed on this limited exam. Skull and upper cervical spine: Not well assessed on this limited exam. Sinuses/Orbits: Not well assessed on this limited exam. Other: None. IMPRESSION: 1. Technically limited and truncated motion degraded exam. 2. Large evolving acute right MCA distribution infarct. No significant regional mass effect at this time. Please note that evaluation for potential associated blood products is fairly limited on this exam. Continued follow-up by CT recommended. 3. Chronic left ACA, right PCA, and bilateral cerebellar infarcts. Electronically Signed   By: Rise Mu M.D.   On: 04/03/2023 22:07   DG CHEST PORT 1 VIEW  Result Date: 04/03/2023 CLINICAL DATA:  Fever EXAM: PORTABLE CHEST 1 VIEW COMPARISON:  08/26/2019 FINDINGS: The patient is rotated to the right on today's radiograph, reducing diagnostic sensitivity and specificity. Accounting for the degree of rightward rotation, which is substantial, cardiac and mediastinal contours are thought to be within normal limits. The lungs appear clear. No blunting of the costophrenic angles. Thoracic spondylosis noted. IMPRESSION: 1. No acute findings. 2. Thoracic spondylosis. Electronically Signed   By: Gaylyn Rong M.D.   On: 04/03/2023 17:55   DG Pelvis Portable  Result Date: 04/03/2023 CLINICAL DATA:  MRI clearance. EXAM:  PORTABLE PELVIS 1-2 VIEWS COMPARISON:  None Available. FINDINGS: There is no evidence of pelvic fracture or diastasis. No pelvic bone lesions are seen. Contrast is noted in urinary bladder. Rounded metallic densities are seen overlying right hip region and sacrum which reportedly are attached to the patient's sweat pants. IMPRESSION: Rounded metallic densities are seen over the pelvis which reportedly are external objects. No other definite radiopaque foreign body is noted. Electronically Signed   By: Lupita Raider M.D.   On: 04/03/2023 17:39   CT ANGIO HEAD NECK W WO CM (CODE STROKE)  Result Date: 04/03/2023 CLINICAL DATA:  Neuro deficit, acute, stroke suspected EXAM: CT ANGIOGRAPHY HEAD AND NECK WITH AND WITHOUT CONTRAST TECHNIQUE: Multidetector CT imaging of the head and neck was performed using the standard protocol during bolus  administration of intravenous contrast. Multiplanar CT image reconstructions and MIPs were obtained to evaluate the vascular anatomy. Carotid stenosis measurements (when applicable) are obtained utilizing NASCET criteria, using the distal internal carotid diameter as the denominator. RADIATION DOSE REDUCTION: This exam was performed according to the departmental dose-optimization program which includes automated exposure control, adjustment of the mA and/or kV according to patient size and/or use of iterative reconstruction technique. CONTRAST:  75mL OMNIPAQUE IOHEXOL 350 MG/ML SOLN COMPARISON:  Same day CT head. FINDINGS: CTA NECK FINDINGS Aortic arch: Great vessel origins are patent without significant stenosis. Atherosclerosis. Right carotid system: No evidence of dissection, stenosis (50% or greater), or occlusion. Left carotid system: No evidence of dissection, stenosis (50% or greater), or occlusion. Carotid bifurcation atherosclerosis. Vertebral arteries: Moderate right vertebral artery origin stenosis. Severe left vertebral artery origin stenosis. Right dominant. Skeleton:  No acute abnormality. Other neck: No acute abnormality. Upper chest: Visualized lung apices are clear. Paraseptal emphysema. Review of the MIP images confirms the above findings CTA HEAD FINDINGS Anterior circulation: Calcific atherosclerosis of the internal carotid arteries. Critical stenosis versus occlusion of the right paraclinoid ICA. Asymmetric opacification of the right M1 MCA. Occluded right M2 MCA vessel. Left MCA and ACAs are patent. Right A1 ACAs not opacified. Distal right ACA stenosis. Severe left M2 MCA stenosis. Posterior circulation: Bilateral intradural vertebral arteries, basilar artery and bilateral posterior cerebral arteries are patent. Severe right P2 PCA stenosis. Venous sinuses: As permitted by contrast timing, patent. Review of the MIP images confirms the above findings IMPRESSION: 1. Occluded right M2 MCA branch. 2. Critical stenosis versus occlusion of the right paraclinoid ICA. Asymmetrically diminished right M1 MCA opacification. 3. Severe left and moderate right vertebral artery origin stenosis. 4. Severe right P2 PCA stenosis with irregular opacification. 5. Severe left M2 MCA stenosis. 6. Aortic Atherosclerosis (ICD10-I70.0) and Emphysema (ICD10-J43.9). Preliminary CTA findings discussed with Dr. Derry Lory via telephone at 3 p.m. on 04/03/2023. Electronically Signed   By: Feliberto Harts M.D.   On: 04/03/2023 15:37   CT HEAD CODE STROKE WO CONTRAST  Result Date: 04/03/2023 CLINICAL DATA:  Code stroke. Neuro deficit, acute, stroke suspected. EXAM: CT HEAD WITHOUT CONTRAST TECHNIQUE: Contiguous axial images were obtained from the base of the skull through the vertex without intravenous contrast. RADIATION DOSE REDUCTION: This exam was performed according to the departmental dose-optimization program which includes automated exposure control, adjustment of the mA and/or kV according to patient size and/or use of iterative reconstruction technique. COMPARISON:  CT head 08/27/2019.  FINDINGS: Brain: Multiple remote infarcts in the cerebellum. Remote right PCA territory and left ACA territory infarcts. Motion limits assessment but there appear to be areas of edema and loss of gray differentiation involving the right basal ganglia, insula and overlying frontal lobe. No evidence of acute infarct, mass lesion, midline shift or hydrocephalus. Vascular: Possible hyperdense right M2 MCA. Skull: No acute fracture. Sinuses/Orbits: No acute abnormality. ASPECTS [facility] Stroke Program Early CT Score) - Ganglionic level infarction (caudate, lentiform nuclei, internal capsule, insula, M1-M3 cortex): 1 - Supraganglionic infarction (M4-M6 cortex): 2 Total score (0-10 with 10 being normal): 3 IMPRESSION: 1. Findings concerning for acute/recent right MCA territory infarct with loss of gray-white involving the right basal ganglia, insula and frontal lobe. ASPECTS is 3. 2. Possible hyperdense right M2 MCA branch. Recommend correlation with forthcoming CTA. 3. Multiple remote infarcts, detailed above. Code stroke imaging results were communicated on 04/03/2023 at 3:00 pm to provider Dr. Derry Lory Via telephone, who verbally acknowledged these results. Electronically Signed  By: Feliberto Harts M.D.   On: 04/03/2023 15:06       Kathlen Mody M.D. [facility] 04/04/2023, 12:51 PM  Available via Epic secure chat 7am-7pm After 7 pm, please refer to night coverage provider listed on amion.

## 2023-04-04 NOTE — TOC Initial Note (Signed)
Transition of Care [facility]) - Initial/Assessment Note    Patient Details  Name: [name] MRN: [id number] Date of Birth: Aug 09, 1956  [facility]) CM/SW Contact:    Baldemar Lenis, LCSW Phone Number: 04/04/2023, 4:49 PM  Clinical Narrative:     CSW noting per chart review that patient is from [place]. CSW spoke with [place], confirmed patient is LTC. CSW noting in therapy notes question if patient is bedbound, so CSW discussed with [facility] on patient's prior level of function. Delay in response due to technical issues, but patient was not bedbound at baseline; unsure of baseline status, but patient would benefit from rehab due to new deficits. CSW to follow.              Expected Discharge Plan: Skilled Nursing Facility Barriers to Discharge: Continued Medical Work up, English as a second language teacher   Patient Goals and CMS Choice Patient states their goals for this hospitalization and ongoing recovery are:: patient unable to participate in goal setting, not oriented CMS Medicare.gov Compare Post Acute Care list provided to:: Patient Represenative (must comment) Choice offered to / list presented to : Adult Children [place] ownership interest in [facility].provided to:: Adult Children    Expected Discharge Plan and Services     Post Acute Care Choice: Skilled Nursing Facility Living arrangements for the past 2 months: Skilled Nursing Facility                                      Prior Living Arrangements/Services Living arrangements for the past 2 months: Skilled Nursing Facility Lives with:: Facility Resident Patient language and need for interpreter reviewed:: No Do you feel safe going back to the place where you live?: Yes      Need for Family Participation in Patient Care: Yes (Comment) Care giver support system in place?: Yes (comment)   Criminal Activity/Legal Involvement Pertinent to Current Situation/Hospitalization: No - Comment as  needed  Activities of Daily Living      Permission Sought/Granted Permission sought to share information with : Facility Medical sales representative, Family Supports Permission granted to share information with : Yes, Verbal Permission Granted  Share Information with NAME: Delila Pereyra  Permission granted to share info w AGENCY: [facility]  Permission granted to share info w Relationship: Son     Emotional Assessment   Attitude/Demeanor/Rapport: Unable to Assess Affect (typically observed): Unable to Assess Orientation: : Oriented to Self, Oriented to Place Alcohol / Substance Use: Not Applicable Psych Involvement: No (comment)  Admission diagnosis:  Acute ischemic stroke [facility]) [I63.9] Acute CVA (cerebrovascular accident) (HCC) [I63.9] CVA (cerebral vascular accident) [facility]) [I63.9] Patient Active Problem List   Diagnosis Date Noted   CVA (cerebral vascular accident) (HCC) 04/04/2023   Acute CVA (cerebrovascular accident) (HCC) 04/03/2023   Hypokalemia 08/31/2019   Cocaine abuse (HCC) 08/29/2019   Hyperlipidemia 08/29/2019   Confusion 08/29/2019   Aphasia as late effect of cerebrovascular accident 05/14/2013   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   Spastic hemiplegia affecting dominant side (HCC) 03/12/2013   History of CVA (cerebrovascular accident) 02/03/2013   HTN (hypertension) 02/03/2013   Tobacco abuse 02/03/2013   PCP:  Clovis [name], L.August Saucer, MD Pharmacy:   [id number] [place], [place] - [phone] AT [facility] OF [facility] & [address] [place] [address] Phone: [phone] Fax: [phone]     Social  Determinants of Health (SDOH) Social History: SDOH Screenings   Food Insecurity: Patient Unable To Answer (04/04/2023)  Housing: High Risk (04/04/2023)  Transportation Needs: Patient Unable To Answer (04/04/2023)  Tobacco Use: Medium Risk (04/04/2023)   SDOH Interventions:     Readmission Risk Interventions      No data to display

## 2023-04-04 NOTE — Evaluation (Signed)
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   Spastic hemiplegia affecting dominant side (HCC) 03/12/2013   History of CVA (cerebrovascular accident) 02/03/2013   HTN (hypertension) 02/03/2013   Tobacco abuse 02/03/2013   Past Medical History:  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   Heart murmur    Hypertension    Psoriasis    "back and stomach" (02/03/2013)   Stroke (HCC) 02/01/2013   left frontal CVA and symptoms of right arm and leg weakness and expressive aphasia/notes 02/03/2013 (02/03/2013)   Past Surgical History:  Past Surgical History:  Procedure Laterality Date   NO PAST SURGERIES     TEE WITHOUT CARDIOVERSION N/A 02/09/2013   Procedure: TRANSESOPHAGEAL ECHOCARDIOGRAM (TEE);  Surgeon: Lewayne Bunting, MD;  Location: [facility] ENDOSCOPY;  Service: Cardiovascular;  Laterality: N/A;   HPI:  [name] is a 67 yo male presenting to ED from [facility] 7/18 after concern for new neurologic deficit at nursing facility. Per H&P note from history obtained from family, pt independently self feeds, but is bed bound and is "poorly verbal" at baseline. MRI Brain with large evolving acute R MCA distribution infarct as well as chronic L ACA, R PCA, and bilateral cerebellar infarcts. PMH  includes previous significant CVA with residual spastic hemiplegia, HTN, HLD, cocaine use   Assessment / Plan / Recommendation Clinical Impression  Pt reports being independent at baseline, although note pt admitted from SNF. Portions of the Cognistat were administered today with some noted difficulty with orientation and attention. Pt required frequent cueing to remain alert and ultimately fell asleep mid-session. When his attention could be sustained, he performed [facility] on tasks involving repetition, attention, naming, and problem solving. Based on reports from family per H&P note, suspect pt is close to his cognitive baseline. His intelligibility is slightly reduced, although suspect this is in part due to missing dentition and not due to any acute L sided weakness. Recommend f/u at SNF upon discharge PRN if presenting different from baseline. Will s/o.    SLP Assessment  SLP Recommendation/Assessment: All further Speech Lanaguage Pathology  needs can be addressed in the next venue of care SLP Visit Diagnosis: Cognitive communication deficit (R41.841)    Recommendations for follow up therapy are one component of a multi-disciplinary discharge planning process, led by the attending physician.  Recommendations may be updated based on patient status, additional functional criteria and insurance authorization.    Follow Up Recommendations  Skilled nursing-short term rehab (<3 hours/day)    Assistance Recommended at Discharge  Frequent or constant Supervision/Assistance  Functional Status Assessment Patient has had a recent decline in their functional status and demonstrates the ability to make significant improvements in function in a reasonable and predictable amount of time.  Frequency and Duration           SLP Evaluation Cognition  Overall  Cognitive Status: History of cognitive impairments - at baseline Arousal/Alertness: Lethargic Orientation Level: Oriented to person;Oriented to  place;Disoriented to time;Disoriented to situation Attention: Sustained Sustained Attention: Impaired Sustained Attention Impairment: Verbal basic Memory: Appears intact Awareness: Impaired Awareness Impairment: Intellectual impairment Problem Solving: Impaired Problem Solving Impairment: Verbal basic       Comprehension  Auditory Comprehension Overall Auditory Comprehension: Appears within functional limits for tasks assessed    Expression Verbal Expression Overall Verbal Expression: Appears within functional limits for tasks assessed Written Expression Dominant Hand: Right   Oral / Motor  Oral Motor/Sensory Function Overall Oral Motor/Sensory Function: Within functional limits Motor Speech Overall Motor Speech: Impaired Respiration: Within functional limits Phonation: Low vocal intensity Resonance: Within functional limits Articulation: Impaired Level of Impairment: Sentence Intelligibility: Intelligibility reduced Sentence: 50-74% accurate            Gwynneth Aliment, M.A., CF-SLP Speech Language Pathology, Acute Rehabilitation Services  Secure Chat preferred [phone]  04/04/2023, 1:29 PM

## 2023-04-05 DIAGNOSIS — F0150 Vascular dementia without behavioral disturbance: Secondary | ICD-10-CM | POA: Insufficient documentation

## 2023-04-05 DIAGNOSIS — R509 Fever, unspecified: Secondary | ICD-10-CM | POA: Insufficient documentation

## 2023-04-05 DIAGNOSIS — R1310 Dysphagia, unspecified: Secondary | ICD-10-CM

## 2023-04-05 DIAGNOSIS — I639 Cerebral infarction, unspecified: Secondary | ICD-10-CM | POA: Diagnosis not present

## 2023-04-05 LAB — CULTURE, BLOOD (ROUTINE X 2)
Culture: NO GROWTH
Special Requests: ADEQUATE
Special Requests: ADEQUATE
Special Requests: ADEQUATE

## 2023-04-05 NOTE — Care Plan (Signed)
Called again to son, no answer

## 2023-04-05 NOTE — Assessment & Plan Note (Addendum)
-   Consult SLP, they cleared for thins, regular consistency

## 2023-04-05 NOTE — Assessment & Plan Note (Addendum)
MRI brain showed evolving large right MCA territory infarct - Non-invasive angiography showed occluded right MCA M2 branch and high grade stenosis or occlusion of right paraclinoid ICA - Echocardiogram showed no cardiogenic source of embolism - Carotid imaging unremarkable   - Lipids ordered: LDL 52 on Lipitor - Aspirin and Plavix x3 months then Plavix alone - Atrial fibrillation: Not present on monitoring - tPA not given because poor functional status, out of time window - Dysphagia screen ordered in ER - PT eval ordered: SNF - Nonsmoker  Seizure-like symptoms noted by Neurology. - Continue LTM EEG

## 2023-04-05 NOTE — Assessment & Plan Note (Signed)
-  Continue Lipitor °

## 2023-04-05 NOTE — Hospital Course (Signed)
[name] is a 67 y.o. M with hx stroke and residual spastic left hemiplegia (ambulatory in Mar per PT/OT notes, since then bed bound per admitting Neurologist note), also HTN, HLD and former polysubstance abuse who presented from SNF with decreased left side strength.  Found to have large right MCA stroke.

## 2023-04-05 NOTE — Assessment & Plan Note (Signed)
-   Permissive HTN - Hold amlodipine and lisionpril today

## 2023-04-05 NOTE — Progress Notes (Signed)
  Progress Note   Patient: [name] ZOX:[id number] DOB: 1956-09-09 DOA: 04/03/2023     1 DOS: the patient was seen and examined on 04/05/2023 at 9:41 AM      Brief hospital course: Mr. Honeycutt is a 67 y.o. M with hx stroke and residual spastic left hemiplegia (ambulatory in Mar per PT/OT notes, since then bed bound per admitting Neurologist note), also HTN, HLD and former polysubstance abuse who presented from SNF with decreased left side strength.  Found to have large right MCA stroke.     Assessment and Plan: * Acute CVA (cerebrovascular accident) (HCC) MRI brain showed evolving large right MCA territory infarct - Non-invasive angiography showed occluded right MCA M2 branch and high grade stenosis or occlusion of right paraclinoid ICA - Echocardiogram showed no cardiogenic source of embolism - Carotid imaging unremarkable   - Lipids ordered: LDL 52 on Lipitor - Aspirin and Plavix x3 months then Plavix alone - Atrial fibrillation: Not present on monitoring - tPA not given because poor functional status, out of time window - Dysphagia screen ordered in ER - PT eval ordered: SNF - Nonsmoker  Seizure-like symptoms noted by Neurology. - Continue LTM EEG  Dysphagia - Consult SLP, they cleared for thins, regular consistency  Vascular dementia (HCC) - Continue depakote and Paxil  Fever Temp 101F on admission.  WBC 12K. COVID negative. - Monitor off antibiotics - If fevers again, will start Rocephin and discharge on Augemntin for presumed aspiration  Hyperlipidemia - Continue Lipitor  HTN (hypertension) - Permissive HTN - Hold amlodipine and lisionpril today          Subjective: Patient tells me to go away.  Says he has no problems.  Asked me to get out of the room, tells nursing to get out of the room.  No fever, change in mental status, no nursing concerns.     Physical Exam: BP (!) 127/104 (BP Location: Right Arm)   Pulse 100   Temp 98.6 F (37 C) (Oral)    Resp 16   Ht 5\' 9"  (1.753 m)   Wt 75 kg   SpO2 96%   BMI 24.42 kg/m   Adult male, lying in bed, no acute distress, paralyzed left side, eating breakfast, with nursing Slightly tachycardic, no murmurs, no peripheral edema Respiratory rate normal, lungs clear without rales or wheezes Paralyzed on the left side, speech slurred but at baseline, face asymmetric but at baseline   Family Communication: Called twice to son, no answer    Disposition: Status is: Inpatient The patient was admitted for stroke.  Stroke workup was completed and neurology have signed off.  Once his long-term EEG is completed, if no seizures and if he has had no further fever, he can be discharged back to SNF tomorrow        Author: Alberteen Sam, MD 04/05/2023 3:00 PM  For on call review [email or web address].

## 2023-04-05 NOTE — Assessment & Plan Note (Addendum)
-   Continue depakote and Paxil

## 2023-04-05 NOTE — Plan of Care (Signed)
  Problem: Education: Goal: Knowledge of disease or condition will improve Outcome: Progressing   Problem: Coping: Goal: Will identify appropriate support needs Outcome: Progressing   Problem: Nutrition: Goal: Risk of aspiration will decrease Outcome: Progressing   Problem: Clinical Measurements: Goal: Respiratory complications will improve Outcome: Progressing   Problem: Elimination: Goal: Will not experience complications related to urinary retention Outcome: Progressing

## 2023-04-05 NOTE — Assessment & Plan Note (Signed)
Temp 101F on admission.  WBC 12K. COVID negative. - Monitor off antibiotics - If fevers again, will start Rocephin and discharge on Augemntin for presumed aspiration

## 2023-04-05 NOTE — Progress Notes (Signed)
Went to room to see [employer] could be restarted and put online, leads were off pt scalp. Tech went ahead and d/c'd the rest of the leads that were on scalp and cleaned pt's scalp. [employer] is also back in lab with note

## 2023-04-06 ENCOUNTER — Inpatient Hospital Stay (HOSPITAL_COMMUNITY): Payer: Medicare Other

## 2023-04-06 DIAGNOSIS — Z7189 Other specified counseling: Secondary | ICD-10-CM

## 2023-04-06 DIAGNOSIS — I639 Cerebral infarction, unspecified: Secondary | ICD-10-CM

## 2023-04-06 DIAGNOSIS — Z515 Encounter for palliative care: Secondary | ICD-10-CM

## 2023-04-06 LAB — CBC WITH DIFFERENTIAL/PLATELET
Abs Immature Granulocytes: 0.05 10*3/uL (ref 0.00–0.07)
Basophils Absolute: 0 10*3/uL (ref 0.0–0.1)
Basophils Relative: 0 %
Eosinophils Absolute: 0 10*3/uL (ref 0.0–0.5)
Eosinophils Relative: 0 %
HCT: 40.8 % (ref 39.0–52.0)
Hemoglobin: 13.3 g/dL (ref 13.0–17.0)
Immature Granulocytes: 1 %
Lymphocytes Relative: 7 %
Lymphs Abs: 0.8 10*3/uL (ref 0.7–4.0)
MCH: 27.3 pg (ref 26.0–34.0)
MCHC: 32.6 g/dL (ref 30.0–36.0)
MCV: 83.8 fL (ref 80.0–100.0)
Monocytes Absolute: 0.9 10*3/uL (ref 0.1–1.0)
Monocytes Relative: 9 %
Neutro Abs: 9 10*3/uL — ABNORMAL HIGH (ref 1.7–7.7)
Neutrophils Relative %: 83 %
Platelets: 233 10*3/uL (ref 150–400)
RBC: 4.87 MIL/uL (ref 4.22–5.81)
RDW: 14.1 % (ref 11.5–15.5)
WBC: 10.8 10*3/uL — ABNORMAL HIGH (ref 4.0–10.5)
nRBC: 0 % (ref 0.0–0.2)

## 2023-04-06 MED ORDER — CLOPIDOGREL BISULFATE 75 MG PO TABS
75.0000 mg | ORAL_TABLET | Freq: Every day | ORAL | Status: DC
  Administered 2023-04-06 – 2023-04-08 (×3): 75 mg via ORAL
  Filled 2023-04-06 (×3): qty 1

## 2023-04-06 MED ORDER — ASPIRIN 81 MG PO TBEC
81.0000 mg | DELAYED_RELEASE_TABLET | Freq: Every day | ORAL | Status: DC
  Administered 2023-04-06 – 2023-04-08 (×3): 81 mg via ORAL
  Filled 2023-04-06 (×3): qty 1

## 2023-04-06 MED ORDER — METOPROLOL TARTRATE 25 MG PO TABS
25.0000 mg | ORAL_TABLET | Freq: Two times a day (BID) | ORAL | Status: DC
  Administered 2023-04-06 – 2023-04-08 (×5): 25 mg via ORAL
  Filled 2023-04-06 (×5): qty 1

## 2023-04-06 NOTE — Progress Notes (Signed)
   Palliative Medicine Inpatient Follow Up Note   The PMT has observed the consultation for [name].   I have called patients son, [name] twice in order to arrange a GOC meeting, awaiting a call back.  NO Charge ______________________________________________________________________________________ [name] [place] Palliative Medicine Team Team Cell Phone: [phone] Please utilize secure chat with additional questions, if there is no response within 30 minutes please call the above phone number  Palliative Medicine Team providers are available by phone from 7am to 7pm daily and can be reached through the team cell phone.  Should this patient require assistance outside of these hours, please call the patient's attending physician.

## 2023-04-06 NOTE — Consult Note (Signed)
Palliative Medicine Inpatient Consult Note  Consulting Provider: Alberteen Sam, MD   Reason for consult:   Palliative Care Consult Services Palliative Medicine Consult  Reason for Consult? goals of care   04/06/2023  HPI:  Per intake H&P --> [name] is a 67 y.o. M with hx stroke and residual spastic left hemiplegia (ambulatory in November 23, 2022 per PT/OT notes, since then bed bound per admitting Neurologist note), also HTN, HLD and former polysubstance abuse who presented from SNF with decreased left side strength. Found to have large right MCA stroke.   The palliative care team has been asked to get involved in the setting of multiple strokes to help further and goals of care conversations.  Clinical Assessment/Goals of Care:  *Please note that this is a verbal dictation therefore any spelling or grammatical errors are due to the "Dragon Medical One" system interpretation.  I have reviewed medical records including EPIC notes, labs and imaging, received report from bedside RN, assessed the patient who is lying in bed and is able to share who he is and where he is though does not quite understand situation.    I met with patients legal guardian and brother [name]  to further discuss diagnosis prognosis, GOC, EOL wishes, disposition and options.   I introduced Palliative Medicine as specialized medical care for people living with serious illness. It focuses on providing relief from the symptoms and stress of a serious illness. The goal is to improve quality of life for both the patient and the family.  Medical History Review and Understanding:  A review of [name]'s past medical history inclusive of multiple strokes, hypertension, hyperlipidemia, advanced dementia, and prior polysubstance abuse was held.  Social History:  We discussed that [name] was married though his wife passed away in November 23, 2022.  He has 1 child [name] who is 37 though has his own mental health issues.  Patient  has 2 siblings a brother [name] and his Sister [name] who are actively involved in his dated activity.  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  Used to love playing sports.  He is a man of faith and practices within the [facility] denomination.   Functional and Nutritional State:  Preceding hospitalization [name] had been living at [facility] since 11/23/2022 of this year where he is a long-term custodial resident.  He requires help with all B ADLs due to left-sided hemiparesis.  Patient had been able to eat and drink well at [facility] preceding this recent hospitalization.  Advance Directives:  A detailed discussion was had today regarding advanced directives.    Code Status:  Concepts specific to code status, artifical feeding and hydration, continued IV antibiotics and rehospitalization was had.  The difference between a aggressive medical intervention path  and a palliative comfort care path for this patient at this time was had.   Encouraged patient/family to consider DNR/DNI status understanding evidenced based poor outcomes in similar hospitalized patient, as the cause of arrest is likely associated with advanced chronic/terminal illness rather than an easily reversible acute cardio-pulmonary event. I explained that DNR/DNI does not change the medical plan and it only comes into effect after a person has arrested (died).  It is a protective measure to keep [place] from harming the patient in their last moments of life.   Patient's brother and sister would like to discuss this among themselves though are open to speaking about it more after they have time to review  the hard choices for loving people book and MOST form.  Discussion:  We reviewed the deficits that can take place in the setting of an acute stroke.  We discussed the concerns associated with [name] physical state as well as his worsening cognitive state.  We reviewed how stroke can  affect your ability to swallow.  I shared my concern based upon my bedside assessment of [name] and his inability to manage his secretions.  We reviewed the thought of speech therapy evaluating him I did educate them on if he should be of recurrent aspiration risk that this is not something we can automatically fix.  We reviewed why with advanced dementia patients you do not use artificial nutrition/gastrostomy tubes.  We discussed the options should [name] continue to deteriorate inclusive of hospice care.  Patient's family are familiar with hospice care from their own mother.  This is something they would be open to if after speech therapy evaluation he is noted to be recurrently aspirating.  Or if his clinical state were to worsen.  We discussed allowing time for patient to show improvements and we will reconvene on Wednesday at 2:00.  Discussed the importance of continued conversation with family and their  medical providers regarding overall plan of care and treatment options, ensuring decisions are within the context of the patients values and GOCs.  Decision Maker: [name],[name] (Brother): [phone] (Mobile)   SUMMARY OF RECOMMENDATIONS   Full Code for the time being though patients brother and sister are going to discuss this further  Discussed possible side effects of strokes notably dysphagia and ongoing aspiration risk  Reviewed the idea of hospice if patient should be noted to recurrently aspirate or lack and improvement  Patient's family would like to meet again on Wednesday at 2 PM  Ongoing palliative care support  Code Status/Advance Care Planning: FULL CODE   Palliative Prophylaxis:  Aspiration, Bowel Regimen, Delirium Protocol, Frequent Pain Assessment, Oral Care, Palliative Wound Care, and Turn Reposition  Additional Recommendations (Limitations, Scope, Preferences): Continue current care  Psycho-social/Spiritual:  Desire for further Chaplaincy support: Not at  this time Additional Recommendations: Education on recurring strokes   Prognosis: Limited overall given patient's current clinical condition high risk for 40-month mortality  Discharge Planning: Discharge to [facility] once medically optimized  Vitals:   04/06/23 1227 04/06/23 1603  BP:  (!) 140/97  Pulse:  86  Resp: 17   Temp:  98.9 F (37.2 C)  SpO2:  100%    Intake/Output Summary (Last 24 hours) at 04/06/2023 1727 Last data filed at 04/06/2023 7425 Gross per 24 hour  Intake 440 ml  Output 600 ml  Net -160 ml   Last Weight  Most recent update: 04/03/2023  3:16 PM    Weight  75 kg (165 lb 5.5 oz)            Gen: Elderly African-American male in no acute distress HEENT: moist mucous membranes CV: Regular rate and rhythm PULM: On room air breathing is even and unlabored ABD: soft/nontender EXT: Left-sided hemiplegia Neuro: Alert and oriented x2  PPS: 10%   This conversation/these recommendations were discussed with patient primary care team, Dr. Jerral Ralph  Billing based on MDM: High ______________________________________________________ Lamarr Lulas [facility] Palliative Medicine Team Team Cell Phone: [phone] Please utilize secure chat with additional questions, if there is no response within 30 minutes please call the above phone number  Palliative Medicine Team providers are available by phone from 7am to 7pm daily and can be  reached through the team cell phone.  Should this patient require assistance outside of these hours, please call the patient's attending physician.

## 2023-04-06 NOTE — Plan of Care (Signed)
  Problem: Coping: Goal: Will identify appropriate support needs Outcome: Progressing   Problem: Health Behavior/Discharge Planning: Goal: Goals will be collaboratively established with patient/family Outcome: Progressing   Problem: Self-Care: Goal: Ability to participate in self-care as condition permits will improve Outcome: Progressing   Problem: Nutrition: Goal: Risk of aspiration will decrease Outcome: Progressing

## 2023-04-06 NOTE — Progress Notes (Signed)
PROGRESS NOTE    [name]  ZOX:[id number] DOB: [birth year] DOA: 04/03/2023 PCP: Clovis [name], L.August Saucer, MD    Brief Narrative:  67 year old with history of stroke and residual spastic left hemiplegia, hypertension, hyperlipidemia and former polysubstance abuse presented from a SNF with decreased left-sided strength and found to have a large right MCA territory stroke.   Assessment & Plan:   Acute ischemic stroke, right MCA: Clinical findings, new onset left-sided weakness, right gaze and left-sided facial droop. CT head findings, loss of gray-white differentiation in the right MCA territory, hyperdense right M2 MCA branch. MRI of the brain, large right MCA territory infarct CT angiogram of the Head and neck: Right M2 MCA occlusion. 2D echocardiogram, normal ejection fraction.  Grade 1 diastolic dysfunction.  No intracardiac thrombus. Antiplatelet therapy, patient on aspirin 81 mg daily prior to admit.  Now on aspirin 81 mg, Plavix 75 mg daily for 3 months then Plavix alone. LDL 52, patient on Lipitor 40 mg. Hemoglobin A1c, 5.5.  No indication to treatment. DVT prophylaxis, SCD Therapy recommendations, skilled nursing facility. Seizure-like activities, LT EEG with evidence of continuous slow spike from the right hemisphere consistent with the large stroke.  No evidence of seizure disorder. Patient able to take heart healthy diet.  Fever with sinus tachycardia: Afebrile last 24 hours.  Continues to have episodic sinus tachycardia. Blood cultures 7/18, 7/19 negative.  Chest x-ray and repeat chest x-ray today negative. COVID-19 and RSV panel negative. Likely central fever, possible from large stroke. Symptomatic treatment.  Repeating blood cultures but holding off on antibiotics. Will start patient on beta-blockers given persistent tachycardia despite not having fever.  Essential hypertension:Blood pressure is adequate.  Amlodipine and lisinopril on hold.  Starting on beta-blockers.    Vascular dementia: On  Depakote and Paxil.   Continue.    DVT prophylaxis: SCD's Start: 04/03/23 1640   Code Status: full code  Family Communication: son updated on the phone Disposition Plan: Status is: Inpatient Remains inpatient appropriate because: Active treatment for stroke, tachycardia     Consultants:  Neurology Palliative care  Procedures:  None  Antimicrobials:  None   Subjective: Patient seen and examined.  Keeps repeating something that is not comprehensible.  He wants some juice.  Unable to keep up any meaningful conversation. Telemetry monitor shows sinus tachycardia, heart rate as high as 140.  Did not have any temperature on repeat exam.  He feels sweaty.  Objective: Vitals:   04/06/23 1139 04/06/23 1145 04/06/23 1158 04/06/23 1227  BP: (!) 152/88     Pulse: (!) 128     Resp: 18 18 18 17   Temp: 98.5 F (36.9 C)     TempSrc: Axillary     SpO2: 96%     Weight:      Height:        Intake/Output Summary (Last 24 hours) at 04/06/2023 1414 Last data filed at 04/06/2023 8119 Gross per 24 hour  Intake 440 ml  Output 600 ml  Net -160 ml   Filed Weights   04/03/23 1400 04/03/23 1516  Weight: 75.6 kg 75 kg    Examination:  General: Sick looking.  Left facial droop. Cardiovascular: S1-S2 normal.  Tachycardic. Respiratory: No added sounds. Gastrointestinal: Soft and nontender. Ext: No swelling or edema.  No cyanosis. Neuro: Slurred speech at baseline.  Left facial droop.  Dense left hemiplegia.     Data Reviewed: I have personally reviewed following labs and imaging studies  CBC: Recent Labs  Lab 04/03/23 1435 04/03/23 1441  04/04/23 1240 04/06/23 0957  WBC 12.3*  --  12.2* 10.8*  NEUTROABS 10.9*  --  8.7* 9.0*  HGB 13.1 13.9 13.8 13.3  HCT 41.8 41.0 43.0 40.8  MCV 85.3  --  82.1 83.8  PLT 221  --  242 233   Basic Metabolic Panel: Recent Labs  Lab 04/03/23 1435 04/03/23 1441  NA 137 141  K 3.7 3.7  CL 104 105  CO2 19*  --    GLUCOSE 120* 117*  BUN 11 13  CREATININE 1.25* 1.20  CALCIUM 8.4*  --    GFR: Estimated Creatinine Clearance: 61.4 mL/min (by C-G formula based on SCr of 1.2 mg/dL). Liver Function Tests: Recent Labs  Lab 04/03/23 1435  AST 38  ALT 38  ALKPHOS 124  BILITOT 0.6  PROT 6.8  ALBUMIN 3.4*   No results for input(s): "LIPASE", "AMYLASE" in the last 168 hours. No results for input(s): "AMMONIA" in the last 168 hours. Coagulation Profile: Recent Labs  Lab 04/03/23 1435  INR 1.2   Cardiac Enzymes: No results for input(s): "CKTOTAL", "CKMB", "CKMBINDEX", "TROPONINI" in the last 168 hours. BNP (last 3 results) No results for input(s): "PROBNP" in the last 8760 hours. HbA1C: Recent Labs    04/04/23 1240  HGBA1C 5.5   CBG: Recent Labs  Lab 04/03/23 1435  GLUCAP 116*   Lipid Profile: Recent Labs    04/04/23 1240  CHOL 122  HDL 59  LDLCALC 52  TRIG 56  CHOLHDL 2.1   Thyroid Function Tests: No results for input(s): "TSH", "T4TOTAL", "FREET4", "T3FREE", "THYROIDAB" in the last 72 hours. Anemia Panel: No results for input(s): "VITAMINB12", "FOLATE", "FERRITIN", "TIBC", "IRON", "RETICCTPCT" in the last 72 hours. Sepsis Labs: Recent Labs  Lab 04/04/23 1240  PROCALCITON 0.79    Recent Results (from the past 240 hour(s))  SARS Coronavirus 2 by RT PCR (hospital order, performed in [facility] hospital lab) *cepheid single result test* Anterior Nasal Swab     Status: None   Collection Time: 04/03/23  4:15 PM   Specimen: Anterior Nasal Swab  Result Value Ref Range Status   SARS Coronavirus 2 by RT PCR NEGATIVE NEGATIVE Final    Comment: Performed at [facility], [address] [place], [place] [address]  Respiratory (~20 pathogens) panel by PCR     Status: None   Collection Time: 04/03/23  4:15 PM   Specimen: Nasopharyngeal Swab; Respiratory  Result Value Ref Range Status   Adenovirus NOT DETECTED NOT DETECTED Final   Coronavirus 229E NOT DETECTED NOT  DETECTED Final    Comment: (NOTE) The Coronavirus on the Respiratory Panel, DOES NOT test for the novel  Coronavirus (2019 nCoV)    Coronavirus HKU1 NOT DETECTED NOT DETECTED Final   Coronavirus NL63 NOT DETECTED NOT DETECTED Final   Coronavirus OC43 NOT DETECTED NOT DETECTED Final   Metapneumovirus NOT DETECTED NOT DETECTED Final   Rhinovirus / Enterovirus NOT DETECTED NOT DETECTED Final   Influenza A NOT DETECTED NOT DETECTED Final   Influenza B NOT DETECTED NOT DETECTED Final   Parainfluenza Virus 1 NOT DETECTED NOT DETECTED Final   Parainfluenza Virus 2 NOT DETECTED NOT DETECTED Final   Parainfluenza Virus 3 NOT DETECTED NOT DETECTED Final   Parainfluenza Virus 4 NOT DETECTED NOT DETECTED Final   Respiratory Syncytial Virus NOT DETECTED NOT DETECTED Final   Bordetella pertussis NOT DETECTED NOT DETECTED Final   Bordetella Parapertussis NOT DETECTED NOT DETECTED Final   Chlamydophila pneumoniae NOT DETECTED NOT  DETECTED Final   Mycoplasma pneumoniae NOT DETECTED NOT DETECTED Final    Comment: Performed at [facility], [address] [place], [place] [address]  Culture, blood (Routine X 2) w Reflex to ID Panel     Status: None (Preliminary result)   Collection Time: 04/03/23 11:14 PM   Specimen: BLOOD RIGHT HAND  Result Value Ref Range Status   Specimen Description BLOOD RIGHT HAND  Final   Special Requests   Final    BOTTLES DRAWN AEROBIC AND ANAEROBIC Blood Culture adequate volume   Culture   Final    NO GROWTH 2 DAYS Performed at [facility], [address], [place], [place] [address]    Report Status PENDING  Incomplete  Culture, blood (Routine X 2) w Reflex to ID Panel     Status: None (Preliminary result)   Collection Time: 04/03/23 11:19 PM   Specimen: BLOOD LEFT HAND  Result Value Ref Range Status   Specimen Description BLOOD LEFT HAND  Final   Special Requests   Final    BOTTLES DRAWN AEROBIC AND ANAEROBIC Blood Culture adequate volume   Culture    Final    NO GROWTH 2 DAYS Performed at [facility], [address] [place], [place] [address]    Report Status PENDING  Incomplete  MRSA Next Gen by PCR, Nasal     Status: None   Collection Time: 04/04/23  5:36 AM   Specimen: Nasal Mucosa; Nasal Swab  Result Value Ref Range Status   MRSA by PCR Next Gen NOT DETECTED NOT DETECTED Final    Comment: (NOTE) The GeneXpert MRSA Assay (FDA approved for NASAL specimens only), is one component of a comprehensive MRSA colonization surveillance program. It is not intended to diagnose MRSA infection nor to guide or monitor treatment for MRSA infections. Test performance is not FDA approved in patients less than 64 years old. Performed at [facility], [address] [place], [place] [address]   Culture, blood (Routine X 2) w Reflex to ID Panel     Status: None (Preliminary result)   Collection Time: 04/04/23  9:21 AM   Specimen: BLOOD LEFT HAND  Result Value Ref Range Status   Specimen Description BLOOD LEFT HAND  Final   Special Requests   Final    BOTTLES DRAWN AEROBIC AND ANAEROBIC Blood Culture adequate volume   Culture   Final    NO GROWTH 2 DAYS Performed at [facility], [address] [place], [place] [address]    Report Status PENDING  Incomplete  Culture, blood (Routine X 2) w Reflex to ID Panel     Status: None (Preliminary result)   Collection Time: 04/04/23  9:26 AM   Specimen: BLOOD RIGHT HAND  Result Value Ref Range Status   Specimen Description BLOOD RIGHT HAND  Final   Special Requests   Final    BOTTLES DRAWN AEROBIC AND ANAEROBIC Blood Culture adequate volume   Culture   Final    NO GROWTH 2 DAYS Performed at [facility], [address], [place], [place] [address]    Report Status PENDING  Incomplete         Radiology Studies: DG CHEST PORT 1 VIEW  Result Date: 04/06/2023 CLINICAL DATA:  Pneumonia. EXAM: PORTABLE CHEST 1 VIEW COMPARISON:  Radiographs 04/03/2023 and 08/26/2019.  FINDINGS: 0940 hours. Patient remains rotated to the right. The heart size and mediastinal contours are stable. Unchanged mild  atelectasis at both lung bases related to suboptimal inspiration. No evidence of confluent airspace disease, edema, pleural effusion or pneumothorax. No acute osseous findings are seen. There are degenerative changes in the thoracic spine. Telemetry leads overlie the chest. IMPRESSION: Stable chest.  No evidence of acute cardiopulmonary process. Electronically Signed   By: Carey Bullocks M.D.   On: 04/06/2023 11:27        Scheduled Meds:  aspirin EC  81 mg Oral Daily   atorvastatin  40 mg Oral QHS   clopidogrel  75 mg Oral Daily   divalproex  125 mg Oral BID   metoprolol tartrate  25 mg Oral BID   PARoxetine  15 mg Oral QHS   Continuous Infusions:   LOS: 2 days    Time spent: 35 minutes    Dorcas Carrow, MD [facility]

## 2023-04-06 NOTE — Plan of Care (Signed)
  Problem: Health Behavior/Discharge Planning: Goal: Goals will be collaboratively established with patient/family Outcome: Progressing   Problem: Self-Care: Goal: Verbalization of feelings and concerns over difficulty with self-care will improve Outcome: Progressing Goal: Ability to communicate needs accurately will improve Outcome: Progressing   Problem: Nutrition: Goal: Risk of aspiration will decrease Outcome: Progressing Goal: Dietary intake will improve Outcome: Progressing   Problem: Education: Goal: Knowledge of General Education information will improve Description: Including pain rating scale, medication(s)/side effects and non-pharmacologic comfort measures Outcome: Progressing   Problem: Health Behavior/Discharge Planning: Goal: Ability to manage health-related needs will improve Outcome: Progressing   Problem: Clinical Measurements: Goal: Ability to maintain clinical measurements within normal limits will improve Outcome: Progressing Goal: Will remain free from infection Outcome: Progressing Goal: Diagnostic test results will improve Outcome: Progressing Goal: Respiratory complications will improve Outcome: Progressing   Problem: Activity: Goal: Risk for activity intolerance will decrease Outcome: Progressing   Problem: Nutrition: Goal: Adequate nutrition will be maintained Outcome: Progressing   Problem: Coping: Goal: Level of anxiety will decrease Outcome: Progressing   Problem: Elimination: Goal: Will not experience complications related to bowel motility Outcome: Progressing Goal: Will not experience complications related to urinary retention Outcome: Progressing   Problem: Pain Managment: Goal: General experience of comfort will improve Outcome: Progressing   Problem: Safety: Goal: Ability to remain free from injury will improve Outcome: Progressing   Problem: Skin Integrity: Goal: Risk for impaired skin integrity will decrease Outcome:  Progressing

## 2023-04-07 DIAGNOSIS — I639 Cerebral infarction, unspecified: Secondary | ICD-10-CM | POA: Diagnosis not present

## 2023-04-07 LAB — CULTURE, BLOOD (ROUTINE X 2)
Culture: NO GROWTH
Culture: NO GROWTH

## 2023-04-07 MED ORDER — PAROXETINE HCL 10 MG PO TABS
10.0000 mg | ORAL_TABLET | Freq: Every day | ORAL | Status: DC
  Administered 2023-04-07: 10 mg via ORAL
  Filled 2023-04-07 (×2): qty 1

## 2023-04-07 NOTE — Plan of Care (Signed)
  Problem: Ischemic Stroke/TIA Tissue Perfusion: Goal: Complications of ischemic stroke/TIA will be minimized Outcome: Progressing   Problem: Health Behavior/Discharge Planning: Goal: Goals will be collaboratively established with patient/family Outcome: Progressing   Problem: Self-Care: Goal: Verbalization of feelings and concerns over difficulty with self-care will improve Outcome: Progressing Goal: Ability to communicate needs accurately will improve Outcome: Progressing   Problem: Nutrition: Goal: Risk of aspiration will decrease Outcome: Progressing Goal: Dietary intake will improve Outcome: Progressing   Problem: Education: Goal: Knowledge of General Education information will improve Description: Including pain rating scale, medication(s)/side effects and non-pharmacologic comfort measures Outcome: Progressing   Problem: Health Behavior/Discharge Planning: Goal: Ability to manage health-related needs will improve Outcome: Progressing   Problem: Clinical Measurements: Goal: Ability to maintain clinical measurements within normal limits will improve Outcome: Progressing Goal: Will remain free from infection Outcome: Progressing Goal: Diagnostic test results will improve Outcome: Progressing Goal: Respiratory complications will improve Outcome: Progressing Goal: Cardiovascular complication will be avoided Outcome: Progressing   Problem: Activity: Goal: Risk for activity intolerance will decrease Outcome: Progressing   Problem: Nutrition: Goal: Adequate nutrition will be maintained Outcome: Progressing   Problem: Coping: Goal: Level of anxiety will decrease Outcome: Progressing   Problem: Elimination: Goal: Will not experience complications related to bowel motility Outcome: Progressing Goal: Will not experience complications related to urinary retention Outcome: Progressing   Problem: Pain Managment: Goal: General experience of comfort will  improve Outcome: Progressing   Problem: Safety: Goal: Ability to remain free from injury will improve Outcome: Progressing   Problem: Skin Integrity: Goal: Risk for impaired skin integrity will decrease Outcome: Progressing

## 2023-04-07 NOTE — Progress Notes (Signed)
Patient is alert and oriented x4. Very inappropriate with staff even after being educated and redirected. He is asking staff to turn around and bend over, to kiss him and to get in bed with him. Bp was high but lower on recheck. Mittens placed back onto hands due to him pulling off tele leads. Denied pain and no pain indicators present. Additional needs denied.

## 2023-04-07 NOTE — Progress Notes (Signed)
Physical Therapy Treatment Patient Details Name: [name] MRN: [id number] DOB: 1956/08/05 Today's Date: 04/07/2023   History of Present Illness Pt is a 67 y/o male presenting 7/18 from SNF with L sided weakness. MRI brain showed large evolving acute right MCA distribution infarct. PMH: CVA with residual spastic hemiplegia, HTN, HLD, cocaine use    PT Comments  He is more lethargic but participatory with PT. Progression today focusing on bed mobility, seated balance, and sit to stand transitions from edge of bed. Mod assist +2 with Lt knee block to rise to standing position, following instructions to push through LEs and lean forward for mechanical advantage. Seated EOB required mod assist but progressed to min assist with RUE for support in lap or holding bed rail. Intermittently pushing strongly towards Lt; less posterior push today. Patient will continue to benefit from skilled physical therapy services to further improve independence with functional mobility.      Assistance Recommended at Discharge Frequent or constant Supervision/Assistance  If plan is discharge home, recommend the following:  Can travel by private vehicle    Two people to help with walking and/or transfers;Two people to help with bathing/dressing/bathroom;Assistance with cooking/housework;Direct supervision/assist for medications management;Direct supervision/assist for financial management;Assist for transportation   No  Equipment Recommendations  None recommended by PT    Recommendations for Other Services       Precautions / Restrictions Precautions Precautions: Fall Restrictions Weight Bearing Restrictions: No     Mobility  Bed Mobility Overal bed mobility: Needs Assistance Bed Mobility: Rolling, Sidelying to Sit, Sit to Sidelying Rolling: Max assist, +2 for physical assistance Sidelying to sit: Max assist, +2 for physical assistance, HOB elevated     Sit to sidelying: Mod assist, +2 for  physical assistance General bed mobility comments: Able to reach for rail with RUE, assisted with +2 max assist for LEs to roll to side, and raise trunk to EOB with LEs supported off bed. Less posterior pushing today however still pushing towards left strongly at times. Mod assist +2 to lean onto Rt elbow and bring LEs into bed.    Transfers Overall transfer level: Needs assistance Equipment used: 2 person hand held assist Transfers: Sit to/from Stand Sit to Stand: Mod assist, +2 physical assistance           General transfer comment: Mod assist +2 with Lt knee block to rise from EOB. BIL hand held support, LUE supported due to hemiparesis. Able to remove Lt knee block and remain standing with mod assist. Could not follow commands to shift weight or flex/extend knees. Cues for upright posture, forward gaze.    Ambulation/Gait                   Stairs             Wheelchair Mobility     Tilt Bed    Modified Rankin (Stroke Patients Only) Modified Rankin (Stroke Patients Only) Pre-Morbid Rankin Score: Moderately severe disability Modified Rankin: Severe disability     Balance Overall balance assessment: Needs assistance Sitting-balance support: Single extremity supported, Feet supported Sitting balance-Leahy Scale: Poor Sitting balance - Comments: Pt with periods of min assist but up to mod assist to balance with Rt hand on knee or grasping rail. Pushes towards hemiparetic side strongly at times. Multimodal cues and feedback for alignment on EOB.  Worked on weight shifting lifting head, and scanning room. Postural control: Left lateral lean Standing balance support: Bilateral upper extremity supported Standing balance-Leahy Scale: Zero (  Mod A +2)                              Cognition Arousal/Alertness: Lethargic Behavior During Therapy: Flat affect Overall Cognitive Status: No family/caregiver present to determine baseline cognitive functioning                                  General Comments: lethargic today, flat affect, inconsistent following of commands and motor planning impairments; difficulty with initiation as well as execution of activities        Exercises      General Comments General comments (skin integrity, edema, etc.): More fatigued today, becomes interactive with engagement at EOB. Fell asleep quickly once returned to bed.      Pertinent Vitals/Pain Pain Assessment Pain Assessment: Faces Faces Pain Scale: No hurt Pain Intervention(s): Monitored during session    Home Living                          Prior Function            PT Goals (current goals can now be found in the care plan section) Acute Rehab PT Goals Patient Stated Goal: Get some rest PT Goal Formulation: Patient unable to participate in goal setting Time For Goal Achievement: 04/18/23 Potential to Achieve Goals: Fair Progress towards PT goals: Progressing toward goals    Frequency    Min 2X/week      PT Plan Current plan remains appropriate    Co-evaluation              AM-PAC PT "6 Clicks" Mobility   Outcome Measure  Help needed turning from your back to your side while in a flat bed without using bedrails?: A Lot Help needed moving from lying on your back to sitting on the side of a flat bed without using bedrails?: Total Help needed moving to and from a bed to a chair (including a wheelchair)?: Total Help needed standing up from a chair using your arms (e.g., wheelchair or bedside chair)?: Total Help needed to walk in hospital room?: Total Help needed climbing 3-5 steps with a railing? : Total 6 Click Score: 7    End of Session Equipment Utilized During Treatment: Gait belt Activity Tolerance: Patient tolerated treatment well Patient left: in bed;with bed alarm set;with call bell/phone within reach;with SCD's reapplied Nurse Communication: Mobility status PT Visit Diagnosis: Muscle  weakness (generalized) (M62.81);Difficulty in walking, not elsewhere classified (R26.2);Other symptoms and signs involving the nervous system (R29.898);Hemiplegia and hemiparesis Hemiplegia - Right/Left: Left Hemiplegia - dominant/non-dominant:  (n/a) Hemiplegia - caused by: Cerebral infarction     Time: 1445-1459 PT Time Calculation (min) (ACUTE ONLY): 14 min  Charges:    $Therapeutic Activity: 8-22 mins PT General Charges $$ ACUTE PT VISIT: 1 Visit                     Kathlyn Sacramento, PT, DPT [facility]  Rehabilitation Services Physical Therapist Office: [phone] Website: [email or web address]    Berton Mount 04/07/2023, 4:11 PM

## 2023-04-07 NOTE — Evaluation (Addendum)
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   Heart murmur    Hypertension    Psoriasis    "back and stomach" (02/03/2013)   Stroke (HCC) 02/01/2013   left frontal CVA and symptoms of right arm and leg weakness and expressive aphasia/notes 02/03/2013 (02/03/2013)   Past Surgical History:  Past Surgical History:  Procedure Laterality Date   NO PAST SURGERIES     TEE WITHOUT CARDIOVERSION N/A 02/09/2013   Procedure: TRANSESOPHAGEAL ECHOCARDIOGRAM (TEE);  Surgeon: [name] Bunting, MD;  Location: [facility] ENDOSCOPY;  Service: Cardiovascular;  Laterality: N/A;   HPI:  [name] is a 67 yo male presenting to ED from [facility] 7/18 after concern for new neurologic deficit at nursing facility. Per H&P note from history obtained from family, pt independently self feeds, but is bed bound and is "poorly verbal" at baseline. MRI Brain with large evolving acute R MCA distribution infarct as well as chronic L ACA, R PCA, and bilateral cerebellar infarcts. Seen by SLP June 2014 for cognition and aphasia and in December 2020 for swallowing. At that time pt presented with fluctuating mentation, impulsivity with self-feeding, and poor awareness of bolus presentation and was recommended Dys 1 with thin liquids. PMH includes previous significant CVA with residual spastic hemiplegia, HTN, HLD, cocaine use    Assessment / Plan / Recommendation  Clinical Impression  Pt with presentation seemingly similar to December 2020 when last seen by SLP with AMS and poor awareness of bolus,  now also affected by moderate L sided weakness. He reports typically eating solids cut into small pieces. Oral motor exam significant for impaired L sided sensation, strength, and ROM. Note pt is edentulous except for three bottom teeth, which are in poor condition and appear to be loose. RN reports concerns with level of alertness and oral manipulation of solid POs. SLP provided oral care with collection of saliva noted in L buccal cavity. Initial sip of thin liquids via straw caused one immediate cough, which appeared to be resolved in subsequent consecutive sips. RN reports observing no coughing at bedside with meals. Trials of purees resulted in significant L sided anterior spillage with no awareness, even given cues. No significant oral residue noted with purees, although solids caused diffuse oral residue and anterior spillage as well as apparent oral holding. Pt required consistent cueing to maintain a state of alertness proficient for PO trials. Due to his fluctuating mentation and overall poor awareness of POs, recommend Dys 1 diet and thin liquids with full supervision to monitor for anterior spillage and provide cueing. SLP will continue to f/u. Pt may be at greater risk for aspiration if given POs when he is not fully awake/alert.  SLP called pt's brother, [name], to review results of bedside swallowing evaluation and reinforce aspiration precautions. He verbalized agreement with diet change.   SLP Visit Diagnosis: Dysphagia, unspecified (R13.10)    Aspiration Risk  Moderate aspiration risk    Diet Recommendation Dysphagia 1 (Puree);Thin liquid    Liquid Administration via: Cup;Straw Medication Administration: Whole meds with puree Supervision: Patient able to  self feed;Full supervision/cueing for compensatory strategies Compensations: Slow rate;Small sips/bites;Monitor for anterior loss Postural Changes: Seated upright at 90 degrees;Remain upright for at least 30 minutes after po intake     Other  Recommendations Oral Care Recommendations: Oral care BID Caregiver Recommendations: Have oral suction available    Recommendations for follow up therapy are one component of a multi-disciplinary discharge planning process, led by the attending physician.  Recommendations may be updated based on patient status, additional functional criteria and insurance authorization.  Follow up Recommendations Skilled nursing-short term rehab (<3 hours/day)      Assistance Recommended at Discharge    Functional Status Assessment Patient has had a recent decline in their functional status and demonstrates the ability to make significant improvements in function in a reasonable and predictable amount of time.  Frequency and Duration min 2x/week  2 weeks       Prognosis Prognosis for improved oropharyngeal function: Good Barriers to Reach Goals: Cognitive deficits;Language deficits;Time post onset      Swallow Study   General HPI: [name] is a 67 yo male presenting to ED from [facility] 7/18 after concern for new neurologic deficit at nursing facility. Per H&P note from history obtained from family, pt independently self feeds, but is bed bound and is "poorly verbal" at baseline. MRI Brain with large evolving acute R MCA distribution infarct as well as chronic L ACA, R PCA, and bilateral cerebellar infarcts. Seen by SLP June 2014 for cognition and aphasia and in December 2020 for swallowing. At that time pt presented with fluctuating mentation, impulsivity with self-feeding, and poor awareness of bolus presentation and was recommended Dys 1 with thin liquids. PMH includes previous significant CVA with residual spastic hemiplegia, HTN, HLD, cocaine use Type of Study: Bedside Swallow Evaluation Previous Swallow Assessment: see HPI Diet Prior to this Study: Regular;Thin liquids (Level 0) Temperature Spikes Noted: No Respiratory Status: Nasal cannula History of Recent Intubation:  No Behavior/Cognition: Alert;Lethargic/Drowsy;Requires cueing Oral Cavity Assessment: Within Functional Limits Oral Care Completed by SLP: Yes Oral Cavity - Dentition: Missing dentition;Poor condition Vision: Functional for self-feeding Self-Feeding Abilities: Able to feed self Patient Positioning: Upright in bed Baseline Vocal Quality: Normal Volitional Cough: Strong Volitional Swallow: Able to elicit    Oral/Motor/Sensory Function Overall Oral Motor/Sensory Function: Moderate impairment Facial ROM: Reduced left;Suspected CN VII (facial) dysfunction Facial Symmetry: Abnormal symmetry left;Suspected CN VII (facial) dysfunction Facial Strength: Reduced left;Suspected CN VII (facial) dysfunction Facial Sensation: Reduced left;Suspected CN V (Trigeminal) dysfunction Lingual ROM: Reduced left;Suspected CN XII (hypoglossal) dysfunction Lingual Symmetry: Abnormal symmetry left;Suspected CN XII (hypoglossal) dysfunction Lingual Strength: Reduced;Suspected CN XII (hypoglossal) dysfunction Lingual Sensation: Reduced;Suspected CN VII (facial) dysfunction-anterior 2/3 tongue   Ice Chips Ice chips: Not tested   Thin Liquid Thin Liquid: Impaired Presentation: Straw;Self Fed Oral Phase Impairments: Poor awareness of bolus Oral Phase Functional Implications: Left anterior spillage;Left lateral sulci pocketing Pharyngeal  Phase Impairments: Cough - Immediate    Nectar Thick Nectar Thick Liquid: Not tested   Honey Thick Honey Thick Liquid: Not tested   Puree Puree: Impaired Presentation: Self Fed;Spoon Oral Phase Impairments: Poor awareness of bolus Oral Phase Functional Implications: Left anterior spillage   Solid     Solid: Impaired Oral Phase Impairments: Poor awareness of bolus Oral Phase Functional Implications: Left anterior spillage;Left lateral sulci pocketing;Oral residue;Oral holding      Gwynneth Aliment, M.A., CF-SLP Speech Language Pathology, Acute Rehabilitation  Services  Secure Chat preferred [phone]  04/07/2023,12:07 PM

## 2023-04-07 NOTE — Progress Notes (Addendum)
PROGRESS NOTE    [name]  ZOX:[id number] DOB: 29-Mar-1956 DOA: 04/03/2023 PCP: Clovis [name], L.August Saucer, MD    Brief Narrative:  67 year old with history of stroke and residual spastic left hemiplegia, hypertension, hyperlipidemia and former polysubstance abuse presented from a SNF with decreased left-sided strength and found to have a large right MCA territory stroke.   Assessment & Plan:   Acute ischemic stroke, right MCA: Clinical findings, new onset left-sided weakness, right gaze and left-sided facial droop. CT head findings, loss of gray-white differentiation in the right MCA territory, hyperdense right M2 MCA branch. MRI of the brain, large right MCA territory infarct CT angiogram of the Head and neck: Right M2 MCA occlusion. 2D echocardiogram, normal ejection fraction.  Grade 1 diastolic dysfunction.  No intracardiac thrombus. Antiplatelet therapy, patient on aspirin 81 mg daily prior to admit.  Now on aspirin 81 mg, Plavix 75 mg daily for 3 months then Plavix alone. LDL 52, patient on Lipitor 40 mg. Hemoglobin A1c, 5.5.  No indication to treatment. DVT prophylaxis, SCD Therapy recommendations, skilled nursing facility. Seizure-like activities, LT EEG with evidence of continuous slow spike from the right hemisphere consistent with the large stroke.  No evidence of seizure disorder. Patient able to take heart healthy diet.  Fever with sinus tachycardia: Low-grade fever.  Likely metabolic fever due to large stroke.  Blood cultures 7/18, 7/19 negative.  Blood culture 7/21, negative so far.   Repeated chest x-ray is negative.   COVID-19 and RSV panel negative. Likely central fever, possible from large stroke. Symptomatic treatment.    Essential hypertension:Blood pressure is adequate.  Amlodipine and lisinopril on hold.  Started on beta-blockers.  Vascular dementia: On  Depakote and Paxil.   Continue.  Goal of care: Palliative following.  Worried about his oral  intake. Calorie count and monitor for oral intake for next 24 to 48 hours, [place].  Intake, will need to discuss about palliation or hospice.  If adequate intake, will discharge back to nursing home with rehab.    DVT prophylaxis: SCD's Start: 04/03/23 1640   Code Status: full code  Family Communication: Patient's brother Beacher Every called and updated. Disposition Plan: Status is: Inpatient Remains inpatient appropriate because: Active treatment for stroke, tachycardia     Consultants:  Neurology Palliative care  Procedures:  None  Antimicrobials:  None   Subjective:  Patient seen and examined.  He looked slightly more interactive today.  He tells me that he did okay.  Patient tells me whether he must go back to [facility].  I asked him to elaborate and he was not able to elaborate further.  He is agreeable.  Low-grade fever 99 over last 24 hours.  Heart rate is mostly stable since last 24-hour.  Objective: Vitals:   04/06/23 2343 04/07/23 0047 04/07/23 0338 04/07/23 0738  BP: (!) 175/98 (!) 143/91 (!) 111/93 123/74  Pulse: 96 86 98 80  Resp: 19  18 17   Temp: 100 F (37.8 C)  98.8 F (37.1 C) 99.1 F (37.3 C)  TempSrc: Oral  Oral Axillary  SpO2: 96%  95% 97%  Weight:      Height:        Intake/Output Summary (Last 24 hours) at 04/07/2023 0953 Last data filed at 04/07/2023 0544 Gross per 24 hour  Intake 570 ml  Output 850 ml  Net -280 ml   Filed Weights   04/03/23 1400 04/03/23 1516  Weight: 75.6 kg 75 kg    Examination:  General: Sick looking.  Left facial droop. Cardiovascular: S1-S2 normal.   Respiratory: No added sounds. Gastrointestinal: Soft and nontender. Ext: No swelling or edema.  No cyanosis. Neuro: Slurred speech at baseline.  Left facial droop.  Dense left hemiplegia.     Data Reviewed: I have personally reviewed following labs and imaging studies  CBC: Recent Labs  Lab 04/03/23 1435 04/03/23 1441 04/04/23 1240 04/06/23 0957   WBC 12.3*  --  12.2* 10.8*  NEUTROABS 10.9*  --  8.7* 9.0*  HGB 13.1 13.9 13.8 13.3  HCT 41.8 41.0 43.0 40.8  MCV 85.3  --  82.1 83.8  PLT 221  --  242 233   Basic Metabolic Panel: Recent Labs  Lab 04/03/23 1435 04/03/23 1441  NA 137 141  K 3.7 3.7  CL 104 105  CO2 19*  --   GLUCOSE 120* 117*  BUN 11 13  CREATININE 1.25* 1.20  CALCIUM 8.4*  --    GFR: Estimated Creatinine Clearance: 61.4 mL/min (by C-G formula based on SCr of 1.2 mg/dL). Liver Function Tests: Recent Labs  Lab 04/03/23 1435  AST 38  ALT 38  ALKPHOS 124  BILITOT 0.6  PROT 6.8  ALBUMIN 3.4*   No results for input(s): "LIPASE", "AMYLASE" in the last 168 hours. No results for input(s): "AMMONIA" in the last 168 hours. Coagulation Profile: Recent Labs  Lab 04/03/23 1435  INR 1.2   Cardiac Enzymes: No results for input(s): "CKTOTAL", "CKMB", "CKMBINDEX", "TROPONINI" in the last 168 hours. BNP (last 3 results) No results for input(s): "PROBNP" in the last 8760 hours. HbA1C: Recent Labs    04/04/23 1240  HGBA1C 5.5   CBG: Recent Labs  Lab 04/03/23 1435  GLUCAP 116*   Lipid Profile: Recent Labs    04/04/23 1240  CHOL 122  HDL 59  LDLCALC 52  TRIG 56  CHOLHDL 2.1   Thyroid Function Tests: No results for input(s): "TSH", "T4TOTAL", "FREET4", "T3FREE", "THYROIDAB" in the last 72 hours. Anemia Panel: No results for input(s): "VITAMINB12", "FOLATE", "FERRITIN", "TIBC", "IRON", "RETICCTPCT" in the last 72 hours. Sepsis Labs: Recent Labs  Lab 04/04/23 1240  PROCALCITON 0.79    Recent Results (from the past 240 hour(s))  SARS Coronavirus 2 by RT PCR (hospital order, performed in [facility] hospital lab) *cepheid single result test* Anterior Nasal Swab     Status: None   Collection Time: 04/03/23  4:15 PM   Specimen: Anterior Nasal Swab  Result Value Ref Range Status   SARS Coronavirus 2 by RT PCR NEGATIVE NEGATIVE Final    Comment: Performed at [facility], [address] [place], [place] [address]  Respiratory (~20 pathogens) panel by PCR     Status: None   Collection Time: 04/03/23  4:15 PM   Specimen: Nasopharyngeal Swab; Respiratory  Result Value Ref Range Status   Adenovirus NOT DETECTED NOT DETECTED Final   Coronavirus 229E NOT DETECTED NOT DETECTED Final    Comment: (NOTE) The Coronavirus on the Respiratory Panel, DOES NOT test for the novel  Coronavirus (2019 nCoV)    Coronavirus HKU1 NOT DETECTED NOT DETECTED Final   Coronavirus NL63 NOT DETECTED NOT DETECTED Final   Coronavirus OC43 NOT DETECTED NOT DETECTED Final   Metapneumovirus NOT DETECTED NOT DETECTED Final   Rhinovirus / Enterovirus NOT DETECTED NOT DETECTED Final   Influenza A NOT DETECTED NOT DETECTED Final   Influenza B NOT DETECTED NOT DETECTED Final   Parainfluenza Virus 1 NOT DETECTED NOT DETECTED Final   Parainfluenza  Virus 2 NOT DETECTED NOT DETECTED Final   Parainfluenza Virus 3 NOT DETECTED NOT DETECTED Final   Parainfluenza Virus 4 NOT DETECTED NOT DETECTED Final   Respiratory Syncytial Virus NOT DETECTED NOT DETECTED Final   Bordetella pertussis NOT DETECTED NOT DETECTED Final   Bordetella Parapertussis NOT DETECTED NOT DETECTED Final   Chlamydophila pneumoniae NOT DETECTED NOT DETECTED Final   Mycoplasma pneumoniae NOT DETECTED NOT DETECTED Final    Comment: Performed at [facility], [address] [place], [place] [address]  Culture, blood (Routine X 2) w Reflex to ID Panel     Status: None (Preliminary result)   Collection Time: 04/03/23 11:14 PM   Specimen: BLOOD RIGHT HAND  Result Value Ref Range Status   Specimen Description BLOOD RIGHT HAND  Final   Special Requests   Final    BOTTLES DRAWN AEROBIC AND ANAEROBIC Blood Culture adequate volume   Culture   Final    NO GROWTH 3 DAYS Performed at [facility], [address], [place], [place] [address]    Report Status PENDING  Incomplete  Culture, blood (Routine X 2) w Reflex to ID Panel     Status:  None (Preliminary result)   Collection Time: 04/03/23 11:19 PM   Specimen: BLOOD LEFT HAND  Result Value Ref Range Status   Specimen Description BLOOD LEFT HAND  Final   Special Requests   Final    BOTTLES DRAWN AEROBIC AND ANAEROBIC Blood Culture adequate volume   Culture   Final    NO GROWTH 3 DAYS Performed at [facility], [address] [place], [place] [address]    Report Status PENDING  Incomplete  MRSA Next Gen by PCR, Nasal     Status: None   Collection Time: 04/04/23  5:36 AM   Specimen: Nasal Mucosa; Nasal Swab  Result Value Ref Range Status   MRSA by PCR Next Gen NOT DETECTED NOT DETECTED Final    Comment: (NOTE) The GeneXpert MRSA Assay (FDA approved for NASAL specimens only), is one component of a comprehensive MRSA colonization surveillance program. It is not intended to diagnose MRSA infection nor to guide or monitor treatment for MRSA infections. Test performance is not FDA approved in patients less than 33 years old. Performed at [facility], [address], [place], [place] [address]   Culture, blood (Routine X 2) w Reflex to ID Panel     Status: None (Preliminary result)   Collection Time: 04/04/23  9:21 AM   Specimen: BLOOD LEFT HAND  Result Value Ref Range Status   Specimen Description BLOOD LEFT HAND  Final   Special Requests   Final    BOTTLES DRAWN AEROBIC AND ANAEROBIC Blood Culture adequate volume   Culture   Final    NO GROWTH 3 DAYS Performed at [facility], [address] [place], [place] [address]    Report Status PENDING  Incomplete  Culture, blood (Routine X 2) w Reflex to ID Panel     Status: None (Preliminary result)   Collection Time: 04/04/23  9:26 AM   Specimen: BLOOD RIGHT HAND  Result Value Ref Range Status   Specimen Description BLOOD RIGHT HAND  Final   Special Requests   Final    BOTTLES DRAWN AEROBIC AND ANAEROBIC Blood Culture adequate volume   Culture   Final    NO GROWTH 3 DAYS Performed at [facility], [address] [place], [place]  [address]    Report Status PENDING  Incomplete  Culture, blood (Routine X 2) w Reflex to ID Panel     Status: None (Preliminary result)   Collection Time: 04/06/23  9:57 AM   Specimen: BLOOD LEFT HAND  Result Value Ref Range Status   Specimen Description BLOOD LEFT HAND  Final   Special Requests   Final    BOTTLES DRAWN AEROBIC AND ANAEROBIC Blood Culture adequate volume   Culture   Final    NO GROWTH < 24 HOURS Performed at [facility], [address], [place], [place] [address]    Report Status PENDING  Incomplete  Culture, blood (Routine X 2) w Reflex to ID Panel     Status: None (Preliminary result)   Collection Time: 04/06/23 10:03 AM   Specimen: BLOOD LEFT HAND  Result Value Ref Range Status   Specimen Description BLOOD LEFT HAND  Final   Special Requests   Final    BOTTLES DRAWN AEROBIC AND ANAEROBIC Blood Culture adequate volume   Culture   Final    NO GROWTH < 24 HOURS Performed at [facility], [address], [place], [place] [address]    Report Status PENDING  Incomplete         Radiology Studies: DG CHEST PORT 1 VIEW  Result Date: 04/06/2023 CLINICAL DATA:  Pneumonia. EXAM: PORTABLE CHEST 1 VIEW COMPARISON:  Radiographs 04/03/2023 and 08/26/2019. FINDINGS: 0940 hours. Patient remains rotated to the right. The heart size and mediastinal contours are stable. Unchanged mild atelectasis at both lung bases related to suboptimal inspiration. No evidence of confluent airspace disease, edema, pleural effusion or pneumothorax. No acute osseous findings are seen. There are degenerative changes in the thoracic spine. Telemetry leads overlie the chest. IMPRESSION: Stable chest.  No evidence of acute cardiopulmonary process. Electronically Signed   By: Carey Bullocks M.D.   On: 04/06/2023 11:27        Scheduled Meds:  aspirin EC  81 mg Oral Daily   atorvastatin  40 mg Oral QHS   clopidogrel  75 mg Oral Daily    divalproex  125 mg Oral BID   metoprolol tartrate  25 mg Oral BID   PARoxetine  15 mg Oral QHS   Continuous Infusions:   LOS: 3 days    Time spent: 35 minutes    Dorcas Carrow, MD [facility]

## 2023-04-07 NOTE — Progress Notes (Signed)
Brief Nutrition Note  New kcal count ordered by MD. Messaged RN to confirm order had been acknowledged and added instructions for completion. Will complete full assessment and review meal tickets for the previous 24 hours on 7/23.  Greig Castilla, RD, LDN Clinical Dietitian RD pager # available in [facility]  After hours/weekend pager # available in [facility]

## 2023-04-07 NOTE — Care Management Important Message (Signed)
Important Message  Patient Details  Name: [name] MRN: [id number] Date of Birth: May 22, 1956   Medicare Important Message Given:  Yes     Dorena Bodo 04/07/2023, 3:41 PM

## 2023-04-07 NOTE — Progress Notes (Incomplete)
Initial Nutrition Assessment  DOCUMENTATION CODES:      INTERVENTION:  ***   NUTRITION DIAGNOSIS:     related to   as evidenced by  .  GOAL:      MONITOR:      REASON FOR ASSESSMENT:   Consult Calorie Count  ASSESSMENT:  Pt with hx of HTN, HLD, vascular dementia, prior substance abuse, and previous CVA presented to ED from SNF with new CVA.  ***   Average Meal Intake: 7/19-7/21: 28% intake x 8 recorded meals  Nutritionally Relevant Medications: Scheduled Meds:  atorvastatin  40 mg Oral QHS   PRN Meds: senna-docusate  Labs Reviewed:   NUTRITION - FOCUSED PHYSICAL EXAM: {RD Focused Exam List:21252}  Diet Order:   Diet Order             Diet Heart Fluid consistency: Thin  Diet effective now                   EDUCATION NEEDS:      Skin:  Skin Assessment: Reviewed RN Assessment  Last BM:  7/20  Height:   Ht Readings from Last 1 Encounters:  04/03/23 5\' 9"  (1.753 m)    Weight:   Wt Readings from Last 1 Encounters:  04/03/23 75 kg    Ideal Body Weight:  72.7 kg  BMI:  Body mass index is 24.42 kg/m.  Estimated Nutritional Needs:  Kcal:  1800-2000 kcal/d Protein:  90-105 g/d Fluid:  >/=1.8L/d    Greig Castilla, RD, LDN Clinical Dietitian RD pager # available in [facility]  After hours/weekend pager # available in [facility]

## 2023-04-08 DIAGNOSIS — I639 Cerebral infarction, unspecified: Secondary | ICD-10-CM | POA: Diagnosis not present

## 2023-04-08 DIAGNOSIS — M6250 Muscle wasting and atrophy, not elsewhere classified, unspecified site: Secondary | ICD-10-CM | POA: Diagnosis not present

## 2023-04-08 DIAGNOSIS — R1311 Dysphagia, oral phase: Secondary | ICD-10-CM | POA: Diagnosis not present

## 2023-04-08 DIAGNOSIS — Z7902 Long term (current) use of antithrombotics/antiplatelets: Secondary | ICD-10-CM | POA: Diagnosis not present

## 2023-04-08 DIAGNOSIS — I69319 Unspecified symptoms and signs involving cognitive functions following cerebral infarction: Secondary | ICD-10-CM | POA: Diagnosis not present

## 2023-04-08 DIAGNOSIS — R278 Other lack of coordination: Secondary | ICD-10-CM | POA: Diagnosis not present

## 2023-04-08 DIAGNOSIS — R279 Unspecified lack of coordination: Secondary | ICD-10-CM | POA: Diagnosis not present

## 2023-04-08 DIAGNOSIS — G459 Transient cerebral ischemic attack, unspecified: Secondary | ICD-10-CM | POA: Diagnosis not present

## 2023-04-08 DIAGNOSIS — I69392 Facial weakness following cerebral infarction: Secondary | ICD-10-CM | POA: Diagnosis not present

## 2023-04-08 DIAGNOSIS — R2689 Other abnormalities of gait and mobility: Secondary | ICD-10-CM | POA: Diagnosis not present

## 2023-04-08 DIAGNOSIS — I69354 Hemiplegia and hemiparesis following cerebral infarction affecting left non-dominant side: Secondary | ICD-10-CM | POA: Diagnosis not present

## 2023-04-08 DIAGNOSIS — R5381 Other malaise: Secondary | ICD-10-CM | POA: Diagnosis not present

## 2023-04-08 DIAGNOSIS — Z515 Encounter for palliative care: Secondary | ICD-10-CM | POA: Diagnosis not present

## 2023-04-08 DIAGNOSIS — M6281 Muscle weakness (generalized): Secondary | ICD-10-CM | POA: Diagnosis not present

## 2023-04-08 DIAGNOSIS — E785 Hyperlipidemia, unspecified: Secondary | ICD-10-CM | POA: Diagnosis not present

## 2023-04-08 DIAGNOSIS — R1310 Dysphagia, unspecified: Secondary | ICD-10-CM | POA: Diagnosis not present

## 2023-04-08 DIAGNOSIS — I69391 Dysphagia following cerebral infarction: Secondary | ICD-10-CM | POA: Diagnosis not present

## 2023-04-08 DIAGNOSIS — Z7401 Bed confinement status: Secondary | ICD-10-CM | POA: Diagnosis not present

## 2023-04-08 DIAGNOSIS — R2681 Unsteadiness on feet: Secondary | ICD-10-CM | POA: Diagnosis not present

## 2023-04-08 DIAGNOSIS — Z7982 Long term (current) use of aspirin: Secondary | ICD-10-CM | POA: Diagnosis not present

## 2023-04-08 DIAGNOSIS — I10 Essential (primary) hypertension: Secondary | ICD-10-CM | POA: Diagnosis not present

## 2023-04-08 DIAGNOSIS — E46 Unspecified protein-calorie malnutrition: Secondary | ICD-10-CM | POA: Diagnosis not present

## 2023-04-08 DIAGNOSIS — R1312 Dysphagia, oropharyngeal phase: Secondary | ICD-10-CM | POA: Diagnosis not present

## 2023-04-08 LAB — CULTURE, BLOOD (ROUTINE X 2)
Culture: NO GROWTH
Culture: NO GROWTH
Special Requests: ADEQUATE

## 2023-04-08 MED ORDER — METOPROLOL TARTRATE 25 MG PO TABS: 25.0000 mg | ORAL_TABLET | Freq: Two times a day (BID) | ORAL | 0 refills | Status: DC

## 2023-04-08 MED ORDER — PAROXETINE HCL 10 MG PO TABS: 10.0000 mg | ORAL_TABLET | Freq: Every day | ORAL | 0 refills | Status: AC

## 2023-04-08 MED ORDER — CLOPIDOGREL BISULFATE 75 MG PO TABS: 75.0000 mg | ORAL_TABLET | Freq: Every day | ORAL | Status: DC

## 2023-04-08 MED ORDER — ASPIRIN 81 MG PO TBEC: 81.0000 mg | DELAYED_RELEASE_TABLET | Freq: Every day | ORAL | Status: DC

## 2023-04-08 NOTE — Progress Notes (Signed)
Discharged to facility by [facility] transportation.

## 2023-04-08 NOTE — Discharge Summary (Signed)
Physician Discharge Summary  [name] PIR:[id number] DOB: 15-Jun-1956 DOA: 04/03/2023  PCP: Clovis [name], L.August Saucer, MD  Admit date: 04/03/2023 Discharge date: 04/08/2023  Admitted From: Long-term nursing home Disposition: Skilled nursing facility  Recommendations for Outpatient Follow-up:  Follow up with PCP in 1-2 weeks Consult palliative care at the skilled nursing facility  Home Health: N/A Equipment/Devices: N/A  Discharge Condition: Fair CODE STATUS: Full code Diet recommendation: Dysphagia 1 diet, thin liquids, assist feeding  Discharge summary: 67 year old with history of stroke and residual spastic left hemiplegia, hypertension, hyperlipidemia and former polysubstance abuse presented from SNF with decreased left-sided strength and found to have a large right MCA territory stroke.    Assessment & plan of care:   Acute ischemic stroke, right MCA: Clinical findings, new onset worsening left-sided weakness, right gaze preference and left-sided facial droop. CT head findings, loss of gray-white differentiation in the right MCA territory, hyperdense right M2 MCA branch. MRI of the brain, large right MCA territory infarct CT angiogram of the Head and neck: Right M2 MCA occlusion. 2D echocardiogram, normal ejection fraction.  Grade 1 diastolic dysfunction.  No intracardiac thrombus. Antiplatelet therapy, patient on aspirin 81 mg daily prior to admit.  Now recommended to stay on aspirin 81 mg, Plavix 75 mg daily for 3 months then Plavix alone. LDL 52, patient on Lipitor 40 mg. Hemoglobin A1c, 5.5.  No indication to treatment. Seizure-like activities, LT EEG with evidence of continuous slow spike from the right hemisphere consistent with the large stroke.  No evidence of seizure disorder.   Fever with sinus tachycardia: Low-grade fever.  Likely metabolic fever due to large stroke.  Blood cultures 7/18, 7/19 negative.  Blood culture 7/21, negative Repeated chest x-ray is negative.    COVID-19 and RSV panel negative. Likely central fever, possible from large stroke. Clinically improved.  Afebrile for last 48 hours.   Essential hypertension:Blood pressure is adequate.  Amlodipine and lisinopril discontinued.  Started on beta-blockers.   Vascular dementia: On  Depakote and Paxil.   Continue. Patient was on Paxil 15 mg which was difficult to administer, he was changed to Paxil 10 mg daily.  Will be prescribed.   Goal of care:  Palliative care discussion with the patient and family .  Patient with recurrent stroke.  Currently remains full code.  Oral intake is variable .  Worried about long-term prognosis.   Family agreed for palliative care consultation at nursing home.    Remains chronically ill, clinically stable to discharge back to SNF level of care.  Discharge Diagnoses:  Principal Problem:   Acute CVA (cerebrovascular accident) (HCC) Active Problems:   HTN (hypertension)   Hyperlipidemia   Fever   Vascular dementia [facility])   Dysphagia    Discharge Instructions  Discharge Instructions     Amb Referral to Palliative Care   Complete by: As directed    Diet - low sodium heart healthy   Complete by: As directed    Dysphagia 1 diet , thin liquids   Increase activity slowly   Complete by: As directed       Allergies as of 04/08/2023       Reactions   Shellfish Allergy Itching, Other (See Comments)   Shrimp, especially        Medication List     STOP taking these medications    amLODipine 10 MG tablet Commonly known as: NORVASC   lisinopril 30 MG tablet Commonly known as: ZESTRIL       TAKE these medications  aspirin EC 81 MG tablet Take 1 tablet (81 mg total) by mouth daily. Swallow whole.   atorvastatin 40 MG tablet Commonly known as: LIPITOR Take 40 mg by mouth at bedtime.   clopidogrel 75 MG tablet Commonly known as: PLAVIX Take 1 tablet (75 mg total) by mouth daily.   divalproex 125 MG capsule Commonly known as:  DEPAKOTE SPRINKLE Take 125 mg by mouth 2 (two) times daily.   metoprolol tartrate 25 MG tablet Commonly known as: LOPRESSOR Take 1 tablet (25 mg total) by mouth 2 (two) times daily.   PARoxetine 10 MG tablet Commonly known as: PAXIL Take 1 tablet (10 mg total) by mouth at bedtime. What changed:  medication strength how much to take when to take this   Pro-Stat AWC Liqd Take 30 mLs by mouth 2 (two) times daily.   senna-docusate 8.6-50 MG tablet Commonly known as: Senokot-S Take 1 tablet by mouth 2 (two) times daily.   Vitamin D (Ergocalciferol) 1.25 MG (50000 UNIT) Caps capsule Commonly known as: DRISDOL Take 50,000 Units by mouth every 7 (seven) days. Given on Mondays        Allergies  Allergen Reactions   Shellfish Allergy Itching and Other (See Comments)    Shrimp, especially    Consultations: Neurology Palliative   Procedures/Studies: DG CHEST PORT 1 VIEW  Result Date: 04/06/2023 CLINICAL DATA:  Pneumonia. EXAM: PORTABLE CHEST 1 VIEW COMPARISON:  Radiographs 04/03/2023 and 08/26/2019. FINDINGS: 0940 hours. Patient remains rotated to the right. The heart size and mediastinal contours are stable. Unchanged mild atelectasis at both lung bases related to suboptimal inspiration. No evidence of confluent airspace disease, edema, pleural effusion or pneumothorax. No acute osseous findings are seen. There are degenerative changes in the thoracic spine. Telemetry leads overlie the chest. IMPRESSION: Stable chest.  No evidence of acute cardiopulmonary process. Electronically Signed   By: Carey Bullocks M.D.   On: 04/06/2023 11:27   ECHOCARDIOGRAM COMPLETE  Result Date: 04/04/2023    ECHOCARDIOGRAM REPORT   Patient Name:   LADAMIEN RAMMEL Date of Exam: 04/04/2023 Medical Rec #:  [id number]     Height:       69.0 in Accession #:    [id number]    Weight:       165.3 lb Date of Birth:  10-10-1955     BSA:          1.906 m Patient Age:    67 years      BP:           141/82 mmHg  Patient Gender: M             HR:           107 bpm. Exam Location:  Inpatient Procedure: 2D Echo, Cardiac Doppler and Color Doppler Indications:    Stroke I63.9  History:        Patient has prior history of Echocardiogram examinations, most                 recent 08/27/2019. Risk Factors:Hypertension.  Sonographer:    Harriette Bouillon Referring Phys: [id number] ALEXANDER B MELVIN IMPRESSIONS  1. Left ventricular ejection fraction, by estimation, is 70 to 75%. The left ventricle has hyperdynamic function. The left ventricle has no regional wall motion abnormalities. Left ventricular diastolic parameters are consistent with Grade I diastolic dysfunction (impaired relaxation).  2. Right ventricular systolic function is normal. The right ventricular size is normal.  3. The mitral valve is normal in structure. No evidence of  mitral valve regurgitation. No evidence of mitral stenosis.  4. The aortic valve is tricuspid. There is mild calcification of the aortic valve. Aortic valve regurgitation is not visualized. No aortic stenosis is present.  5. The inferior vena cava is normal in size with greater than 50% respiratory variability, suggesting right atrial pressure of 3 mmHg. FINDINGS  Left Ventricle: Left ventricular ejection fraction, by estimation, is 70 to 75%. The left ventricle has hyperdynamic function. The left ventricle has no regional wall motion abnormalities. The left ventricular internal cavity size was normal in size. There is no left ventricular hypertrophy. Left ventricular diastolic parameters are consistent with Grade I diastolic dysfunction (impaired relaxation). Right Ventricle: The right ventricular size is normal. No increase in right ventricular wall thickness. Right ventricular systolic function is normal. Left Atrium: Left atrial size was normal in size. Right Atrium: Right atrial size was normal in size. Pericardium: There is no evidence of pericardial effusion. Mitral Valve: The mitral valve is  normal in structure. No evidence of mitral valve regurgitation. No evidence of mitral valve stenosis. Tricuspid Valve: The tricuspid valve is normal in structure. Tricuspid valve regurgitation is not demonstrated. No evidence of tricuspid stenosis. Aortic Valve: The aortic valve is tricuspid. There is mild calcification of the aortic valve. Aortic valve regurgitation is not visualized. No aortic stenosis is present. Pulmonic Valve: The pulmonic valve was not well visualized. Pulmonic valve regurgitation is not visualized. No evidence of pulmonic stenosis. Aorta: The aortic root is normal in size and structure. Venous: The inferior vena cava is normal in size with greater than 50% respiratory variability, suggesting right atrial pressure of 3 mmHg. IAS/Shunts: No atrial level shunt detected by color flow Doppler.  LEFT VENTRICLE PLAX 2D LVIDd:         4.60 cm   Diastology LVIDs:         3.60 cm   LV e' medial:    9.79 cm/s LV PW:         0.90 cm   LV E/e' medial:  7.9 LV IVS:        0.90 cm   LV e' lateral:   9.48 cm/s LVOT diam:     2.00 cm   LV E/e' lateral: 8.1 LV SV:         50 LV SV Index:   26 LVOT Area:     3.14 cm  RIGHT VENTRICLE             IVC RV S prime:     17.70 cm/s  IVC diam: 1.40 cm TAPSE (M-mode): 1.3 cm LEFT ATRIUM             Index        RIGHT ATRIUM          Index LA diam:        2.50 cm 1.31 cm/m   RA Area:     7.03 cm LA Vol (A2C):   20.6 ml 10.81 ml/m  RA Volume:   11.10 ml 5.83 ml/m LA Vol (A4C):   19.9 ml 10.44 ml/m LA Biplane Vol: 20.8 ml 10.92 ml/m  AORTIC VALVE LVOT Vmax:   112.00 cm/s LVOT Vmean:  71.200 cm/s LVOT VTI:    0.160 m  AORTA Ao Root diam: 3.50 cm Ao Asc diam:  3.10 cm MITRAL VALVE MV Area (PHT): 6.54 cm    SHUNTS MV Decel Time: 116 msec    Systemic VTI:  0.16 m MV E velocity: 77.10 cm/s  Systemic  Diam: 2.00 cm MV A velocity: 94.50 cm/s MV E/A ratio:  0.82 Arvilla Meres MD Electronically signed by Arvilla Meres MD Signature Date/Time: 04/04/2023/11:23:16 AM     Final    Overnight EEG with video  Result Date: 04/04/2023 Charlsie Quest, MD     04/05/2023  9:47 AM Patient Name: Willy Pinkerton MRN: [id number] Epilepsy Attending: Charlsie Quest Referring Physician/Provider: Marjorie Smolder, NP Duration: 04/03/2023 1527 to 04/04/2023 0049 Patient history: 67yo M with intermittent episodes of shaking getting eeg to evaluate for seizure Level of alertness: Awake, asleep AEDs during EEG study: VPA Technical aspects: This EEG study was done with scalp electrodes positioned according to the 10-20 International system of electrode placement. Electrical activity was reviewed with band pass filter of 1-70Hz , sensitivity of 7 uV/mm, display speed of 85mm/sec with a 60Hz  notched filter applied as appropriate. EEG data were recorded continuously and digitally stored.  Video monitoring was available and reviewed as appropriate. Description: The posterior dominant rhythm consists of 8 Hz activity of moderate voltage (25-35 uV) seen predominantly in posterior head regions, symmetric and reactive to eye opening and eye closing. Sleep was characterized by vertex waves, sleep spindles (12 to 14 Hz), maximal frontocentral region. EEG showed continuous 3 to 6 Hz theta-delta slowing in right hemisphere. Hyperventilation and photic stimulation were not performed.   EEG was disconnected between 04/03/2023 2031 to 04/04/2023 0017 for imaging ABNORMALITY - Continuous slow, right hemisphere IMPRESSION: This study is suggestive of cortical dysfunction arising from right hemisphere, likely secondary to underlying stroke. No seizures or epileptiform discharges were seen throughout the recording. Charlsie Quest   MR BRAIN WO CONTRAST  Result Date: 04/03/2023 CLINICAL DATA:  Follow-up examination for stroke. EXAM: MRI HEAD WITHOUT CONTRAST TECHNIQUE: Multiplanar, multiecho pulse sequences of the brain and surrounding structures were obtained without intravenous contrast. COMPARISON:  Prior CTs  from earlier the same day. FINDINGS: Brain: Examination is technically limited due to the patient's inability to tolerate the full length of the study. Diffusion-weighted sequence, FLAIR sequence, and SWI sequences only were performed. Additionally, provided images are severely degraded by motion artifact. Large confluent area of restricted diffusion involving the right cerebral hemisphere is seen, consistent with a large evolving acute right MCA distribution infarct. Nearly the entirety of the right MCA distribution appears to be involved. No significant regional mass effect at this time. No visible associated hemorrhage, although evaluation fairly limited due to extensive motion artifact on this exam. No other visible acute or subacute ischemia. Chronic infarcts involving the left ACA and right PCA distributions are noted. Chronic bilateral cerebellar infarcts noted as well. No visible mass lesion. No midline shift or hydrocephalus. No visible extra-axial fluid collection. Vascular: Not well assessed on this limited exam. Skull and upper cervical spine: Not well assessed on this limited exam. Sinuses/Orbits: Not well assessed on this limited exam. Other: None. IMPRESSION: 1. Technically limited and truncated motion degraded exam. 2. Large evolving acute right MCA distribution infarct. No significant regional mass effect at this time. Please note that evaluation for potential associated blood products is fairly limited on this exam. Continued follow-up by CT recommended. 3. Chronic left ACA, right PCA, and bilateral cerebellar infarcts. Electronically Signed   By: Rise Mu M.D.   On: 04/03/2023 22:07   DG CHEST PORT 1 VIEW  Result Date: 04/03/2023 CLINICAL DATA:  Fever EXAM: PORTABLE CHEST 1 VIEW COMPARISON:  08/26/2019 FINDINGS: The patient is rotated to the right on today's radiograph, reducing diagnostic  sensitivity and specificity. Accounting for the degree of rightward rotation, which is  substantial, cardiac and mediastinal contours are thought to be within normal limits. The lungs appear clear. No blunting of the costophrenic angles. Thoracic spondylosis noted. IMPRESSION: 1. No acute findings. 2. Thoracic spondylosis. Electronically Signed   By: Gaylyn Rong M.D.   On: 04/03/2023 17:55   DG Pelvis Portable  Result Date: 04/03/2023 CLINICAL DATA:  MRI clearance. EXAM: PORTABLE PELVIS 1-2 VIEWS COMPARISON:  None Available. FINDINGS: There is no evidence of pelvic fracture or diastasis. No pelvic bone lesions are seen. Contrast is noted in urinary bladder. Rounded metallic densities are seen overlying right hip region and sacrum which reportedly are attached to the patient's sweat pants. IMPRESSION: Rounded metallic densities are seen over the pelvis which reportedly are external objects. No other definite radiopaque foreign body is noted. Electronically Signed   By: Lupita Raider M.D.   On: 04/03/2023 17:39   CT ANGIO HEAD NECK W WO CM (CODE STROKE)  Result Date: 04/03/2023 CLINICAL DATA:  Neuro deficit, acute, stroke suspected EXAM: CT ANGIOGRAPHY HEAD AND NECK WITH AND WITHOUT CONTRAST TECHNIQUE: Multidetector CT imaging of the head and neck was performed using the standard protocol during bolus administration of intravenous contrast. Multiplanar CT image reconstructions and MIPs were obtained to evaluate the vascular anatomy. Carotid stenosis measurements (when applicable) are obtained utilizing NASCET criteria, using the distal internal carotid diameter as the denominator. RADIATION DOSE REDUCTION: This exam was performed according to the departmental dose-optimization program which includes automated exposure control, adjustment of the mA and/or kV according to patient size and/or use of iterative reconstruction technique. CONTRAST:  75mL OMNIPAQUE IOHEXOL 350 MG/ML SOLN COMPARISON:  Same day CT head. FINDINGS: CTA NECK FINDINGS Aortic arch: Great vessel origins are patent  without significant stenosis. Atherosclerosis. Right carotid system: No evidence of dissection, stenosis (50% or greater), or occlusion. Left carotid system: No evidence of dissection, stenosis (50% or greater), or occlusion. Carotid bifurcation atherosclerosis. Vertebral arteries: Moderate right vertebral artery origin stenosis. Severe left vertebral artery origin stenosis. Right dominant. Skeleton: No acute abnormality. Other neck: No acute abnormality. Upper chest: Visualized lung apices are clear. Paraseptal emphysema. Review of the MIP images confirms the above findings CTA HEAD FINDINGS Anterior circulation: Calcific atherosclerosis of the internal carotid arteries. Critical stenosis versus occlusion of the right paraclinoid ICA. Asymmetric opacification of the right M1 MCA. Occluded right M2 MCA vessel. Left MCA and ACAs are patent. Right A1 ACAs not opacified. Distal right ACA stenosis. Severe left M2 MCA stenosis. Posterior circulation: Bilateral intradural vertebral arteries, basilar artery and bilateral posterior cerebral arteries are patent. Severe right P2 PCA stenosis. Venous sinuses: As permitted by contrast timing, patent. Review of the MIP images confirms the above findings IMPRESSION: 1. Occluded right M2 MCA branch. 2. Critical stenosis versus occlusion of the right paraclinoid ICA. Asymmetrically diminished right M1 MCA opacification. 3. Severe left and moderate right vertebral artery origin stenosis. 4. Severe right P2 PCA stenosis with irregular opacification. 5. Severe left M2 MCA stenosis. 6. Aortic Atherosclerosis (ICD10-I70.0) and Emphysema (ICD10-J43.9). Preliminary CTA findings discussed with Dr. Derry Lory via telephone at 3 p.m. on 04/03/2023. Electronically Signed   By: Feliberto Harts M.D.   On: 04/03/2023 15:37   CT HEAD CODE STROKE WO CONTRAST  Result Date: 04/03/2023 CLINICAL DATA:  Code stroke. Neuro deficit, acute, stroke suspected. EXAM: CT HEAD WITHOUT CONTRAST TECHNIQUE:  Contiguous axial images were obtained from the base of the skull through the vertex  without intravenous contrast. RADIATION DOSE REDUCTION: This exam was performed according to the departmental dose-optimization program which includes automated exposure control, adjustment of the mA and/or kV according to patient size and/or use of iterative reconstruction technique. COMPARISON:  CT head 08/27/2019. FINDINGS: Brain: Multiple remote infarcts in the cerebellum. Remote right PCA territory and left ACA territory infarcts. Motion limits assessment but there appear to be areas of edema and loss of gray differentiation involving the right basal ganglia, insula and overlying frontal lobe. No evidence of acute infarct, mass lesion, midline shift or hydrocephalus. Vascular: Possible hyperdense right M2 MCA. Skull: No acute fracture. Sinuses/Orbits: No acute abnormality. ASPECTS [facility] Stroke Program Early CT Score) - Ganglionic level infarction (caudate, lentiform nuclei, internal capsule, insula, M1-M3 cortex): 1 - Supraganglionic infarction (M4-M6 cortex): 2 Total score (0-10 with 10 being normal): 3 IMPRESSION: 1. Findings concerning for acute/recent right MCA territory infarct with loss of gray-white involving the right basal ganglia, insula and frontal lobe. ASPECTS is 3. 2. Possible hyperdense right M2 MCA branch. Recommend correlation with forthcoming CTA. 3. Multiple remote infarcts, detailed above. Code stroke imaging results were communicated on 04/03/2023 at 3:00 pm to provider Dr. Derry Lory Via telephone, who verbally acknowledged these results. Electronically Signed   By: Feliberto Harts M.D.   On: 04/03/2023 15:06   (Echo, Carotid, EGD, Colonoscopy, ERCP)    Subjective: Patient seen and examined.  He is more interactive today with difficult to understand due to dysarthria.  Patient tells me he feels fine.   Discharge Exam: Vitals:   04/08/23 0423 04/08/23 0750  BP: (!) 120/59 122/69  Pulse: (!) 59  61  Resp: 15 14  Temp: 98.5 F (36.9 C) 98.2 F (36.8 C)  SpO2: 99% 98%   Vitals:   04/07/23 2019 04/08/23 0009 04/08/23 0423 04/08/23 0750  BP: 127/69 (!) 143/70 (!) 120/59 122/69  Pulse: 75 68 (!) 59 61  Resp: 16 16 15 14   Temp: 98 F (36.7 C) 99 F (37.2 C) 98.5 F (36.9 C) 98.2 F (36.8 C)  TempSrc:  Oral  Axillary  SpO2: 98% 98% 99% 98%  Weight:      Height:        General: Pt is alert, awake, chronically sick looking.  Frail and debilitated. Difficult to ask orientation questions.  He is oriented to himself and his family.  Not oriented to time place and situation. Left facial droop.  Slurred speech at baseline. Dense left-sided hemiplegia. Cardiovascular: RRR, S1/S2 +, no rubs, no gallops Respiratory: CTA bilaterally, no wheezing, no rhonchi Abdominal: Soft, NT, ND, bowel sounds + Extremities: no edema, no cyanosis    The results of significant diagnostics from this hospitalization (including imaging, microbiology, ancillary and laboratory) are listed below for reference.     Microbiology: Recent Results (from the past 240 hour(s))  SARS Coronavirus 2 by RT PCR (hospital order, performed in [facility] hospital lab) *cepheid single result test* Anterior Nasal Swab     Status: None   Collection Time: 04/03/23  4:15 PM   Specimen: Anterior Nasal Swab  Result Value Ref Range Status   SARS Coronavirus 2 by RT PCR NEGATIVE NEGATIVE Final    Comment: Performed at [facility], [address] [place], [place] [address]  Respiratory (~20 pathogens) panel by PCR     Status: None   Collection Time: 04/03/23  4:15 PM   Specimen: Nasopharyngeal Swab; Respiratory  Result Value Ref Range Status   Adenovirus NOT DETECTED NOT DETECTED  Final   Coronavirus 229E NOT DETECTED NOT DETECTED Final    Comment: (NOTE) The Coronavirus on the Respiratory Panel, DOES NOT test for the novel  Coronavirus (2019 nCoV)    Coronavirus HKU1 NOT DETECTED NOT DETECTED Final    Coronavirus NL63 NOT DETECTED NOT DETECTED Final   Coronavirus OC43 NOT DETECTED NOT DETECTED Final   Metapneumovirus NOT DETECTED NOT DETECTED Final   Rhinovirus / Enterovirus NOT DETECTED NOT DETECTED Final   Influenza A NOT DETECTED NOT DETECTED Final   Influenza B NOT DETECTED NOT DETECTED Final   Parainfluenza Virus 1 NOT DETECTED NOT DETECTED Final   Parainfluenza Virus 2 NOT DETECTED NOT DETECTED Final   Parainfluenza Virus 3 NOT DETECTED NOT DETECTED Final   Parainfluenza Virus 4 NOT DETECTED NOT DETECTED Final   Respiratory Syncytial Virus NOT DETECTED NOT DETECTED Final   Bordetella pertussis NOT DETECTED NOT DETECTED Final   Bordetella Parapertussis NOT DETECTED NOT DETECTED Final   Chlamydophila pneumoniae NOT DETECTED NOT DETECTED Final   Mycoplasma pneumoniae NOT DETECTED NOT DETECTED Final    Comment: Performed at [facility], [address] [place], [place] [address]  Culture, blood (Routine X 2) w Reflex to ID Panel     Status: None (Preliminary result)   Collection Time: 04/03/23 11:14 PM   Specimen: BLOOD RIGHT HAND  Result Value Ref Range Status   Specimen Description BLOOD RIGHT HAND  Final   Special Requests   Final    BOTTLES DRAWN AEROBIC AND ANAEROBIC Blood Culture adequate volume   Culture   Final    NO GROWTH 4 DAYS Performed at [facility], [address], [place], [place] [address]    Report Status PENDING  Incomplete  Culture, blood (Routine X 2) w Reflex to ID Panel     Status: None (Preliminary result)   Collection Time: 04/03/23 11:19 PM   Specimen: BLOOD LEFT HAND  Result Value Ref Range Status   Specimen Description BLOOD LEFT HAND  Final   Special Requests   Final    BOTTLES DRAWN AEROBIC AND ANAEROBIC Blood Culture adequate volume   Culture   Final    NO GROWTH 4 DAYS Performed at [facility], [address] [place], [place] [address]    Report Status PENDING  Incomplete  MRSA Next Gen by PCR, Nasal     Status: None    Collection Time: 04/04/23  5:36 AM   Specimen: Nasal Mucosa; Nasal Swab  Result Value Ref Range Status   MRSA by PCR Next Gen NOT DETECTED NOT DETECTED Final    Comment: (NOTE) The GeneXpert MRSA Assay (FDA approved for NASAL specimens only), is one component of a comprehensive MRSA colonization surveillance program. It is not intended to diagnose MRSA infection nor to guide or monitor treatment for MRSA infections. Test performance is not FDA approved in patients less than 61 years old. Performed at [facility], [address], [place], [place] [address]   Culture, blood (Routine X 2) w Reflex to ID Panel     Status: None (Preliminary result)   Collection Time: 04/04/23  9:21 AM   Specimen: BLOOD LEFT HAND  Result Value Ref Range Status   Specimen Description BLOOD LEFT HAND  Final   Special Requests   Final    BOTTLES DRAWN AEROBIC AND ANAEROBIC Blood Culture adequate volume   Culture   Final    NO GROWTH 4 DAYS Performed at [facility]  Lab, [address], [place], [place] [address]    Report Status PENDING  Incomplete  Culture, blood (Routine X 2) w Reflex to ID Panel     Status: None (Preliminary result)   Collection Time: 04/04/23  9:26 AM   Specimen: BLOOD RIGHT HAND  Result Value Ref Range Status   Specimen Description BLOOD RIGHT HAND  Final   Special Requests   Final    BOTTLES DRAWN AEROBIC AND ANAEROBIC Blood Culture adequate volume   Culture   Final    NO GROWTH 4 DAYS Performed at [facility], [address] [place], [place] [address]    Report Status PENDING  Incomplete  Culture, blood (Routine X 2) w Reflex to ID Panel     Status: None (Preliminary result)   Collection Time: 04/06/23  9:57 AM   Specimen: BLOOD LEFT HAND  Result Value Ref Range Status   Specimen Description BLOOD LEFT HAND  Final   Special Requests   Final    BOTTLES DRAWN AEROBIC AND ANAEROBIC Blood Culture adequate volume   Culture   Final    NO GROWTH 2  DAYS Performed at [facility] Lab, [address], [place], [place] [address]    Report Status PENDING  Incomplete  Culture, blood (Routine X 2) w Reflex to ID Panel     Status: None (Preliminary result)   Collection Time: 04/06/23 10:03 AM   Specimen: BLOOD LEFT HAND  Result Value Ref Range Status   Specimen Description BLOOD LEFT HAND  Final   Special Requests   Final    BOTTLES DRAWN AEROBIC AND ANAEROBIC Blood Culture adequate volume   Culture   Final    NO GROWTH 2 DAYS Performed at [facility] Lab, [address], [place], [place] [address]    Report Status PENDING  Incomplete     Labs: BNP (last 3 results) No results for input(s): "BNP" in the last 8760 hours. Basic Metabolic Panel: Recent Labs  Lab 04/03/23 1435 04/03/23 1441  NA 137 141  K 3.7 3.7  CL 104 105  CO2 19*  --   GLUCOSE 120* 117*  BUN 11 13  CREATININE 1.25* 1.20  CALCIUM 8.4*  --    Liver Function Tests: Recent Labs  Lab 04/03/23 1435  AST 38  ALT 38  ALKPHOS 124  BILITOT 0.6  PROT 6.8  ALBUMIN 3.4*   No results for input(s): "LIPASE", "AMYLASE" in the last 168 hours. No results for input(s): "AMMONIA" in the last 168 hours. CBC: Recent Labs  Lab 04/03/23 1435 04/03/23 1441 04/04/23 1240 04/06/23 0957  WBC 12.3*  --  12.2* 10.8*  NEUTROABS 10.9*  --  8.7* 9.0*  HGB 13.1 13.9 13.8 13.3  HCT 41.8 41.0 43.0 40.8  MCV 85.3  --  82.1 83.8  PLT 221  --  242 233   Cardiac Enzymes: No results for input(s): "CKTOTAL", "CKMB", "CKMBINDEX", "TROPONINI" in the last 168 hours. BNP: Invalid input(s): "POCBNP" CBG: Recent Labs  Lab 04/03/23 1435  GLUCAP 116*   D-Dimer No results for input(s): "DDIMER" in the last 72 hours. Hgb A1c No results for input(s): "HGBA1C" in the last 72 hours. Lipid Profile No results for input(s): "CHOL", "HDL", "LDLCALC", "TRIG", "CHOLHDL", "LDLDIRECT" in the last 72 hours. Thyroid function studies No results for input(s): "TSH", "T4TOTAL",  "T3FREE", "THYROIDAB" in the last 72 hours.  Invalid input(s): "FREET3" Anemia work up No results for input(s): "VITAMINB12", "FOLATE", "FERRITIN", "TIBC", "IRON", "  RETICCTPCT" in the last 72 hours. Urinalysis    Component Value Date/Time   COLORURINE STRAW (A) 04/03/2023 2053   APPEARANCEUR CLEAR 04/03/2023 2053   LABSPEC 1.019 04/03/2023 2053   PHURINE 7.0 04/03/2023 2053   GLUCOSEU NEGATIVE 04/03/2023 2053   HGBUR MODERATE (A) 04/03/2023 2053   BILIRUBINUR NEGATIVE 04/03/2023 2053   KETONESUR NEGATIVE 04/03/2023 2053   PROTEINUR NEGATIVE 04/03/2023 2053   UROBILINOGEN 0.2 02/04/2013 1315   NITRITE NEGATIVE 04/03/2023 2053   LEUKOCYTESUR NEGATIVE 04/03/2023 2053   Sepsis Labs Recent Labs  Lab 04/03/23 1435 04/04/23 1240 04/06/23 0957  WBC 12.3* 12.2* 10.8*   Microbiology Recent Results (from the past 240 hour(s))  SARS Coronavirus 2 by RT PCR (hospital order, performed in [facility] hospital lab) *cepheid single result test* Anterior Nasal Swab     Status: None   Collection Time: 04/03/23  4:15 PM   Specimen: Anterior Nasal Swab  Result Value Ref Range Status   SARS Coronavirus 2 by RT PCR NEGATIVE NEGATIVE Final    Comment: Performed at [facility] Lab, [address] [place], [place] [address]  Respiratory (~20 pathogens) panel by PCR     Status: None   Collection Time: 04/03/23  4:15 PM   Specimen: Nasopharyngeal Swab; Respiratory  Result Value Ref Range Status   Adenovirus NOT DETECTED NOT DETECTED Final   Coronavirus 229E NOT DETECTED NOT DETECTED Final    Comment: (NOTE) The Coronavirus on the Respiratory Panel, DOES NOT test for the novel  Coronavirus (2019 nCoV)    Coronavirus HKU1 NOT DETECTED NOT DETECTED Final   Coronavirus NL63 NOT DETECTED NOT DETECTED Final   Coronavirus OC43 NOT DETECTED NOT DETECTED Final   Metapneumovirus NOT DETECTED NOT DETECTED Final   Rhinovirus / Enterovirus NOT DETECTED NOT DETECTED Final   Influenza A NOT DETECTED NOT  DETECTED Final   Influenza B NOT DETECTED NOT DETECTED Final   Parainfluenza Virus 1 NOT DETECTED NOT DETECTED Final   Parainfluenza Virus 2 NOT DETECTED NOT DETECTED Final   Parainfluenza Virus 3 NOT DETECTED NOT DETECTED Final   Parainfluenza Virus 4 NOT DETECTED NOT DETECTED Final   Respiratory Syncytial Virus NOT DETECTED NOT DETECTED Final   Bordetella pertussis NOT DETECTED NOT DETECTED Final   Bordetella Parapertussis NOT DETECTED NOT DETECTED Final   Chlamydophila pneumoniae NOT DETECTED NOT DETECTED Final   Mycoplasma pneumoniae NOT DETECTED NOT DETECTED Final    Comment: Performed at [facility] Lab, [address], [place], [place] [address]  Culture, blood (Routine X 2) w Reflex to ID Panel     Status: None (Preliminary result)   Collection Time: 04/03/23 11:14 PM   Specimen: BLOOD RIGHT HAND  Result Value Ref Range Status   Specimen Description BLOOD RIGHT HAND  Final   Special Requests   Final    BOTTLES DRAWN AEROBIC AND ANAEROBIC Blood Culture adequate volume   Culture   Final    NO GROWTH 4 DAYS Performed at [facility] Lab, [address] [place], [place] [address]    Report Status PENDING  Incomplete  Culture, blood (Routine X 2) w Reflex to ID Panel     Status: None (Preliminary result)   Collection Time: 04/03/23 11:19 PM   Specimen: BLOOD LEFT HAND  Result Value Ref Range Status   Specimen Description BLOOD LEFT HAND  Final   Special Requests   Final    BOTTLES DRAWN AEROBIC AND ANAEROBIC Blood Culture adequate volume   Culture  Final    NO GROWTH 4 DAYS Performed at [facility] Lab, [address] [place], [place] [address]    Report Status PENDING  Incomplete  MRSA Next Gen by PCR, Nasal     Status: None   Collection Time: 04/04/23  5:36 AM   Specimen: Nasal Mucosa; Nasal Swab  Result Value Ref Range Status   MRSA by PCR Next Gen NOT DETECTED NOT DETECTED Final    Comment: (NOTE) The GeneXpert MRSA Assay (FDA approved for NASAL specimens  only), is one component of a comprehensive MRSA colonization surveillance program. It is not intended to diagnose MRSA infection nor to guide or monitor treatment for MRSA infections. Test performance is not FDA approved in patients less than 84 years old. Performed at [facility] Lab, [address], [place], [place] [address]   Culture, blood (Routine X 2) w Reflex to ID Panel     Status: None (Preliminary result)   Collection Time: 04/04/23  9:21 AM   Specimen: BLOOD LEFT HAND  Result Value Ref Range Status   Specimen Description BLOOD LEFT HAND  Final   Special Requests   Final    BOTTLES DRAWN AEROBIC AND ANAEROBIC Blood Culture adequate volume   Culture   Final    NO GROWTH 4 DAYS Performed at [facility] Lab, [address] [place], [place] [address]    Report Status PENDING  Incomplete  Culture, blood (Routine X 2) w Reflex to ID Panel     Status: None (Preliminary result)   Collection Time: 04/04/23  9:26 AM   Specimen: BLOOD RIGHT HAND  Result Value Ref Range Status   Specimen Description BLOOD RIGHT HAND  Final   Special Requests   Final    BOTTLES DRAWN AEROBIC AND ANAEROBIC Blood Culture adequate volume   Culture   Final    NO GROWTH 4 DAYS Performed at [facility] Lab, [address], [place], [place] [address]    Report Status PENDING  Incomplete  Culture, blood (Routine X 2) w Reflex to ID Panel     Status: None (Preliminary result)   Collection Time: 04/06/23  9:57 AM   Specimen: BLOOD LEFT HAND  Result Value Ref Range Status   Specimen Description BLOOD LEFT HAND  Final   Special Requests   Final    BOTTLES DRAWN AEROBIC AND ANAEROBIC Blood Culture adequate volume   Culture   Final    NO GROWTH 2 DAYS Performed at [facility] Lab, [address] [place], [place] [address]    Report Status PENDING  Incomplete  Culture, blood (Routine X 2) w Reflex to ID Panel     Status: None (Preliminary result)   Collection Time: 04/06/23 10:03 AM    Specimen: BLOOD LEFT HAND  Result Value Ref Range Status   Specimen Description BLOOD LEFT HAND  Final   Special Requests   Final    BOTTLES DRAWN AEROBIC AND ANAEROBIC Blood Culture adequate volume   Culture   Final    NO GROWTH 2 DAYS Performed at [facility] Lab, [address], [place], [place] [address]    Report Status PENDING  Incomplete     Time coordinating discharge: 35 minutes  SIGNED:   Dorcas Carrow, MD  [facility] 04/08/2023, 9:44 AM

## 2023-04-08 NOTE — Progress Notes (Signed)
Occupational Therapy Treatment Patient Details Name: [name] MRN: [id number] DOB: 12/26/1955 Today's Date: 04/08/2023   History of present illness Pt is a 67 y/o male presenting 7/18 from SNF with L sided weakness. MRI brain showed large evolving acute right MCA distribution infarct. PMH: CVA with residual spastic hemiplegia, HTN, HLD, cocaine use   OT comments  Focused OT session on EOB sitting balance and scanning to L visual field. Pt participatory in WB exercises EOB though intermittent heavy pushing backwards noted requiring Max A to correct. Pt able to assist with basic UB ADLs though extensive assist still needed for full ADL completion. Unable to progress OOB today with poor sitting balance and pt fatigue requesting to return to supine. Continue to recommend DC back to SNF with therapy follow up as needed.   Recommendations for follow up therapy are one component of a multi-disciplinary discharge planning process, led by the attending physician.  Recommendations may be updated based on patient status, additional functional criteria and insurance authorization.    Assistance Recommended at Discharge Frequent or constant Supervision/Assistance  Patient can return home with the following  Two people to help with walking and/or transfers;A lot of help with bathing/dressing/bathroom;Two people to help with bathing/dressing/bathroom   Equipment Recommendations  None recommended by OT    Recommendations for Other Services      Precautions / Restrictions Precautions Precautions: Fall Restrictions Weight Bearing Restrictions: No       Mobility Bed Mobility Overal bed mobility: Needs Assistance Bed Mobility: Rolling, Sidelying to Sit, Sit to Supine Rolling: Max assist Sidelying to sit: Max assist, +2 for physical assistance, +2 for safety/equipment   Sit to supine: Max assist, +2 for physical assistance, +2 for safety/equipment   General bed mobility comments: some assist to  pull on bedrail with RUE and muscle activation to bring RLE towards EOB though extensive assist needed to complete bed mobility    Transfers                   General transfer comment: unable due to heavy pushing today     Balance Overall balance assessment: Needs assistance Sitting-balance support: Single extremity supported, Feet supported Sitting balance-Leahy Scale: Poor Sitting balance - Comments: brief periods of Min/Mod A w/ WB through B elbow EOB. Tendency to push back with heavy Max A to correct at times Postural control: Posterior lean                                 ADL either performed or assessed with clinical judgement   ADL Overall ADL's : Needs assistance/impaired     Grooming: Moderate assistance;Sitting Grooming Details (indicate cue type and reason): able to wipe mouth with paper towel when cued/provided. assist for bringing suction to mouth - difficulty sweeping in mouth to suction secretions                               General ADL Comments: Focus on EOB attempts limited by pt request to lay down and tendency to push back    Extremity/Trunk Assessment Upper Extremity Assessment Upper Extremity Assessment: LUE deficits/detail LUE Deficits / Details: overall flaccid, reports sensation intact though did not respond to stimuli on LUE; PROM WFL w/ some increased time needed to stretch to full elbow flexion LUE Sensation: decreased light touch;decreased proprioception LUE Coordination: decreased fine motor;decreased gross motor  Lower Extremity Assessment Lower Extremity Assessment: Defer to PT evaluation        Vision   Vision Assessment?: Vision impaired- to be further tested in functional context Additional Comments: L inattention, preference for R head turn/gaze but with increased time and multimodal cues, able to scan to L side to identify two objects in field. increased cues needed to find tv in L upper visual field    Perception     Praxis      Cognition Arousal/Alertness: Lethargic, Awake/alert Behavior During Therapy: Flat affect Overall Cognitive Status: No family/caregiver present to determine baseline cognitive functioning Area of Impairment: Orientation, Attention, Memory, Following commands, Safety/judgement, Awareness, Problem solving                 Orientation Level: Disoriented to, Time, Situation Current Attention Level: Sustained Memory: Decreased short-term memory Following Commands: Follows one step commands inconsistently, Follows one step commands with increased time Safety/Judgement: Decreased awareness of safety, Decreased awareness of deficits Awareness: Intellectual Problem Solving: Slow processing, Requires tactile cues, Requires verbal cues, Decreased initiation, Difficulty sequencing General Comments: lethargic on entry with eyes closed majority of time but did respond to questions, follows approx 75% ofr commands. poor memory as pt unsure which SNF he has been at, current situation or deficits        Exercises Exercises: Other exercises Other Exercises Other Exercises: WB through B elbow EOB    Shoulder Instructions       General Comments      Pertinent Vitals/ Pain       Pain Assessment Pain Assessment: No/denies pain Faces Pain Scale: No hurt Pain Intervention(s): Monitored during session  Home Living                                          Prior Functioning/Environment              Frequency  Min 2X/week        Progress Toward Goals  OT Goals(current goals can now be found in the care plan section)  Progress towards OT goals: OT to reassess next treatment  Acute Rehab OT Goals Patient Stated Goal: "can i lay back down?" OT Goal Formulation: With patient Time For Goal Achievement: 04/18/23 Potential to Achieve Goals: Fair ADL Goals Pt Will Perform Grooming: with mod assist;sitting Pt Will Perform Upper Body  Bathing: with mod assist;sitting Pt/caregiver will Perform Home Exercise Program: Increased strength;Increased ROM;Left upper extremity;With minimal assist;With written HEP provided Additional ADL Goal #1: Pt to demonstrate ability to maintain sitting balance > 5 min with no more than Mod A during functional ADLs EOB  Plan Discharge plan remains appropriate    Co-evaluation                 AM-PAC OT "6 Clicks" Daily Activity     Outcome Measure   Help from another person eating meals?: Total Help from another person taking care of personal grooming?: A Lot Help from another person toileting, which includes using toliet, bedpan, or urinal?: Total Help from another person bathing (including washing, rinsing, drying)?: Total Help from another person to put on and taking off regular upper body clothing?: A Lot Help from another person to put on and taking off regular lower body clothing?: Total 6 Click Score: 8    End of Session    OT Visit Diagnosis: Unsteadiness on feet (R26.81);Other  abnormalities of gait and mobility (R26.89);Muscle weakness (generalized) (M62.81)   Activity Tolerance Patient limited by fatigue   Patient Left in bed;with bed alarm set;with call bell/phone within reach   Nurse Communication          Time: [phone] OT Time Calculation (min): 16 min  Charges: OT General Charges $OT Visit: 1 Visit OT Treatments $Therapeutic Activity: 8-22 mins  Bradd Canary, OTR/L Acute Rehab Services Office: [phone]   Lorre Munroe 04/08/2023, 9:57 AM

## 2023-04-08 NOTE — Plan of Care (Signed)
  Problem: Ischemic Stroke/TIA Tissue Perfusion: Goal: Complications of ischemic stroke/TIA will be minimized Outcome: Progressing   Problem: Coping: Goal: Will verbalize positive feelings about self Outcome: Progressing Goal: Will identify appropriate support needs Outcome: Progressing   Problem: Health Behavior/Discharge Planning: Goal: Ability to manage health-related needs will improve Outcome: Progressing Goal: Goals will be collaboratively established with patient/family Outcome: Progressing   Problem: Self-Care: Goal: Ability to participate in self-care as condition permits will improve Outcome: Progressing Goal: Verbalization of feelings and concerns over difficulty with self-care will improve Outcome: Progressing Goal: Ability to communicate needs accurately will improve Outcome: Progressing   Problem: Nutrition: Goal: Risk of aspiration will decrease Outcome: Progressing Goal: Dietary intake will improve Outcome: Progressing   Problem: Education: Goal: Knowledge of General Education information will improve Description: Including pain rating scale, medication(s)/side effects and non-pharmacologic comfort measures Outcome: Progressing   Problem: Health Behavior/Discharge Planning: Goal: Ability to manage health-related needs will improve Outcome: Progressing   Problem: Clinical Measurements: Goal: Ability to maintain clinical measurements within normal limits will improve Outcome: Progressing Goal: Will remain free from infection Outcome: Progressing Goal: Diagnostic test results will improve Outcome: Progressing Goal: Respiratory complications will improve Outcome: Progressing Goal: Cardiovascular complication will be avoided Outcome: Progressing   Problem: Activity: Goal: Risk for activity intolerance will decrease Outcome: Progressing   Problem: Nutrition: Goal: Adequate nutrition will be maintained Outcome: Progressing   Problem: Coping: Goal:  Level of anxiety will decrease Outcome: Progressing   Problem: Elimination: Goal: Will not experience complications related to bowel motility Outcome: Progressing Goal: Will not experience complications related to urinary retention Outcome: Progressing   Problem: Pain Managment: Goal: General experience of comfort will improve Outcome: Progressing   Problem: Safety: Goal: Ability to remain free from injury will improve Outcome: Progressing   Problem: Skin Integrity: Goal: Risk for impaired skin integrity will decrease Outcome: Progressing

## 2023-04-08 NOTE — Plan of Care (Signed)
  Problem: Education: Goal: Knowledge of disease or condition will improve Outcome: Progressing   Problem: Ischemic Stroke/TIA Tissue Perfusion: Goal: Complications of ischemic stroke/TIA will be minimized Outcome: Progressing   Problem: Activity: Goal: Risk for activity intolerance will decrease Outcome: Progressing   Problem: Coping: Goal: Level of anxiety will decrease Outcome: Progressing   Problem: Skin Integrity: Goal: Risk for impaired skin integrity will decrease Outcome: Progressing

## 2023-04-08 NOTE — TOC Transition Note (Signed)
Transition of Care [facility]) - CM/SW Discharge Note   Patient Details  Name: [name] MRN: [id number] Date of Birth: [birth year]  [facility]) CM/SW Contact:  Eduard Roux, LCSW Phone Number: 04/08/2023, 12:31 PM   Clinical Narrative:     Patient will Discharge to: [facility]  Discharge Date: 04/08/2023 Family Notified: brother  Transport By: Sharin Mons  Per MD patient is ready for discharge. RN, patient, and facility notified of discharge. Discharge Summary sent to facility. RN given number for [phone]. Ambulance transport requested for patient.   Clinical Social Worker signing off.  Antony Blackbird, MSW, LCSW Clinical Social Worker     Final next level of care: Skilled Nursing Facility Barriers to Discharge: Barriers Resolved   Patient Goals and CMS Choice CMS Medicare.gov Compare Post Acute Care list provided to:: Patient Represenative (must comment) Choice offered to / list presented to : Adult Children  Discharge Placement                Patient chooses bed at: [facility] Patient to be transferred to facility by: [facility] Name of family member notified: brother Patient and family notified of of transfer: 04/08/23  Discharge Plan and Services Additional resources added to the After Visit Summary for       Post Acute Care Choice: Skilled Nursing Facility                               Social Determinants of Health (SDOH) Interventions SDOH Screenings   Food Insecurity: Patient Unable To Answer (04/04/2023)  Housing: High Risk (04/04/2023)  Transportation Needs: Patient Unable To Answer (04/04/2023)  Tobacco Use: Medium Risk (04/04/2023)     Readmission Risk Interventions     No data to display

## 2023-04-08 NOTE — TOC Progression Note (Signed)
Transition of Care [facility]) - Progression Note    Patient Details  Name: [name] MRN: [id number] Date of Birth: 18-Oct-1955  Transition of [facility]) CM/SW Contact  Eduard Roux, [place] Phone Number: 04/08/2023, 10:13 AM  Clinical Narrative:     Sent message to [facility] to determine if they admit patient today.   Antony Blackbird, MSW, LCSW Clinical Social Worker    Expected Discharge Plan: Skilled Nursing Facility Barriers to Discharge: Continued Medical Work up, English as a second language teacher  Expected Discharge Plan and Services     Post Acute Care Choice: Skilled Nursing Facility Living arrangements for the past 2 months: Skilled Nursing Facility Expected Discharge Date: 04/08/23                                     Social Determinants of Health (SDOH) Interventions SDOH Screenings   Food Insecurity: Patient Unable To Answer (04/04/2023)  Housing: High Risk (04/04/2023)  Transportation Needs: Patient Unable To Answer (04/04/2023)  Tobacco Use: Medium Risk (04/04/2023)    Readmission Risk Interventions     No data to display

## 2023-04-09 LAB — CULTURE, BLOOD (ROUTINE X 2)
Culture: NO GROWTH
Special Requests: ADEQUATE
Special Requests: ADEQUATE

## 2023-04-10 DIAGNOSIS — E785 Hyperlipidemia, unspecified: Secondary | ICD-10-CM | POA: Diagnosis not present

## 2023-04-10 DIAGNOSIS — I69391 Dysphagia following cerebral infarction: Secondary | ICD-10-CM | POA: Diagnosis not present

## 2023-04-10 DIAGNOSIS — I69319 Unspecified symptoms and signs involving cognitive functions following cerebral infarction: Secondary | ICD-10-CM | POA: Diagnosis not present

## 2023-04-10 DIAGNOSIS — R1310 Dysphagia, unspecified: Secondary | ICD-10-CM | POA: Diagnosis not present

## 2023-04-10 DIAGNOSIS — Z7902 Long term (current) use of antithrombotics/antiplatelets: Secondary | ICD-10-CM | POA: Diagnosis not present

## 2023-04-10 DIAGNOSIS — I69354 Hemiplegia and hemiparesis following cerebral infarction affecting left non-dominant side: Secondary | ICD-10-CM | POA: Diagnosis not present

## 2023-04-10 DIAGNOSIS — I69392 Facial weakness following cerebral infarction: Secondary | ICD-10-CM | POA: Diagnosis not present

## 2023-04-10 DIAGNOSIS — Z7982 Long term (current) use of aspirin: Secondary | ICD-10-CM | POA: Diagnosis not present

## 2023-04-10 DIAGNOSIS — R5381 Other malaise: Secondary | ICD-10-CM | POA: Diagnosis not present

## 2023-04-10 DIAGNOSIS — I10 Essential (primary) hypertension: Secondary | ICD-10-CM | POA: Diagnosis not present

## 2023-04-14 DIAGNOSIS — M6250 Muscle wasting and atrophy, not elsewhere classified, unspecified site: Secondary | ICD-10-CM | POA: Diagnosis not present

## 2023-04-14 DIAGNOSIS — R1311 Dysphagia, oral phase: Secondary | ICD-10-CM | POA: Diagnosis not present

## 2023-04-14 DIAGNOSIS — M6281 Muscle weakness (generalized): Secondary | ICD-10-CM | POA: Diagnosis not present

## 2023-04-14 DIAGNOSIS — I69354 Hemiplegia and hemiparesis following cerebral infarction affecting left non-dominant side: Secondary | ICD-10-CM | POA: Diagnosis not present

## 2023-04-14 DIAGNOSIS — I639 Cerebral infarction, unspecified: Secondary | ICD-10-CM | POA: Diagnosis not present

## 2023-04-14 DIAGNOSIS — E46 Unspecified protein-calorie malnutrition: Secondary | ICD-10-CM | POA: Diagnosis not present

## 2023-04-15 DIAGNOSIS — Z515 Encounter for palliative care: Secondary | ICD-10-CM | POA: Diagnosis not present

## 2023-04-15 DIAGNOSIS — I69354 Hemiplegia and hemiparesis following cerebral infarction affecting left non-dominant side: Secondary | ICD-10-CM | POA: Diagnosis not present

## 2023-04-15 DIAGNOSIS — R1312 Dysphagia, oropharyngeal phase: Secondary | ICD-10-CM | POA: Diagnosis not present

## 2023-04-15 DIAGNOSIS — Z7401 Bed confinement status: Secondary | ICD-10-CM | POA: Diagnosis not present

## 2023-04-16 DIAGNOSIS — R1311 Dysphagia, oral phase: Secondary | ICD-10-CM | POA: Diagnosis not present

## 2023-04-16 DIAGNOSIS — M6250 Muscle wasting and atrophy, not elsewhere classified, unspecified site: Secondary | ICD-10-CM | POA: Diagnosis not present

## 2023-04-16 DIAGNOSIS — I69354 Hemiplegia and hemiparesis following cerebral infarction affecting left non-dominant side: Secondary | ICD-10-CM | POA: Diagnosis not present

## 2023-04-16 DIAGNOSIS — I639 Cerebral infarction, unspecified: Secondary | ICD-10-CM | POA: Diagnosis not present

## 2023-04-16 DIAGNOSIS — E46 Unspecified protein-calorie malnutrition: Secondary | ICD-10-CM | POA: Diagnosis not present

## 2023-04-16 DIAGNOSIS — M6281 Muscle weakness (generalized): Secondary | ICD-10-CM | POA: Diagnosis not present

## 2023-04-21 DIAGNOSIS — I639 Cerebral infarction, unspecified: Secondary | ICD-10-CM | POA: Diagnosis not present

## 2023-04-21 DIAGNOSIS — I69354 Hemiplegia and hemiparesis following cerebral infarction affecting left non-dominant side: Secondary | ICD-10-CM | POA: Diagnosis not present

## 2023-04-21 DIAGNOSIS — M6281 Muscle weakness (generalized): Secondary | ICD-10-CM | POA: Diagnosis not present

## 2023-04-21 DIAGNOSIS — M6250 Muscle wasting and atrophy, not elsewhere classified, unspecified site: Secondary | ICD-10-CM | POA: Diagnosis not present

## 2023-04-21 DIAGNOSIS — R1311 Dysphagia, oral phase: Secondary | ICD-10-CM | POA: Diagnosis not present

## 2023-04-21 DIAGNOSIS — E46 Unspecified protein-calorie malnutrition: Secondary | ICD-10-CM | POA: Diagnosis not present

## 2023-04-22 DIAGNOSIS — R262 Difficulty in walking, not elsewhere classified: Secondary | ICD-10-CM | POA: Diagnosis not present

## 2023-04-22 DIAGNOSIS — R1311 Dysphagia, oral phase: Secondary | ICD-10-CM | POA: Diagnosis not present

## 2023-04-22 DIAGNOSIS — R1312 Dysphagia, oropharyngeal phase: Secondary | ICD-10-CM | POA: Diagnosis not present

## 2023-04-22 DIAGNOSIS — I639 Cerebral infarction, unspecified: Secondary | ICD-10-CM | POA: Diagnosis not present

## 2023-04-22 DIAGNOSIS — M6281 Muscle weakness (generalized): Secondary | ICD-10-CM | POA: Diagnosis not present

## 2023-04-22 DIAGNOSIS — R2681 Unsteadiness on feet: Secondary | ICD-10-CM | POA: Diagnosis not present

## 2023-04-23 DIAGNOSIS — R262 Difficulty in walking, not elsewhere classified: Secondary | ICD-10-CM | POA: Diagnosis not present

## 2023-04-23 DIAGNOSIS — R1312 Dysphagia, oropharyngeal phase: Secondary | ICD-10-CM | POA: Diagnosis not present

## 2023-04-23 DIAGNOSIS — R1311 Dysphagia, oral phase: Secondary | ICD-10-CM | POA: Diagnosis not present

## 2023-04-23 DIAGNOSIS — M6281 Muscle weakness (generalized): Secondary | ICD-10-CM | POA: Diagnosis not present

## 2023-04-23 DIAGNOSIS — I639 Cerebral infarction, unspecified: Secondary | ICD-10-CM | POA: Diagnosis not present

## 2023-04-23 DIAGNOSIS — R2681 Unsteadiness on feet: Secondary | ICD-10-CM | POA: Diagnosis not present

## 2023-04-24 DIAGNOSIS — R2681 Unsteadiness on feet: Secondary | ICD-10-CM | POA: Diagnosis not present

## 2023-04-24 DIAGNOSIS — M6281 Muscle weakness (generalized): Secondary | ICD-10-CM | POA: Diagnosis not present

## 2023-04-24 DIAGNOSIS — I639 Cerebral infarction, unspecified: Secondary | ICD-10-CM | POA: Diagnosis not present

## 2023-04-24 DIAGNOSIS — R262 Difficulty in walking, not elsewhere classified: Secondary | ICD-10-CM | POA: Diagnosis not present

## 2023-04-24 DIAGNOSIS — R1312 Dysphagia, oropharyngeal phase: Secondary | ICD-10-CM | POA: Diagnosis not present

## 2023-04-24 DIAGNOSIS — R1311 Dysphagia, oral phase: Secondary | ICD-10-CM | POA: Diagnosis not present

## 2023-04-25 DIAGNOSIS — R1312 Dysphagia, oropharyngeal phase: Secondary | ICD-10-CM | POA: Diagnosis not present

## 2023-04-25 DIAGNOSIS — R1311 Dysphagia, oral phase: Secondary | ICD-10-CM | POA: Diagnosis not present

## 2023-04-25 DIAGNOSIS — R262 Difficulty in walking, not elsewhere classified: Secondary | ICD-10-CM | POA: Diagnosis not present

## 2023-04-25 DIAGNOSIS — I639 Cerebral infarction, unspecified: Secondary | ICD-10-CM | POA: Diagnosis not present

## 2023-04-25 DIAGNOSIS — M6281 Muscle weakness (generalized): Secondary | ICD-10-CM | POA: Diagnosis not present

## 2023-04-25 DIAGNOSIS — R2681 Unsteadiness on feet: Secondary | ICD-10-CM | POA: Diagnosis not present

## 2023-04-28 DIAGNOSIS — R2681 Unsteadiness on feet: Secondary | ICD-10-CM | POA: Diagnosis not present

## 2023-04-28 DIAGNOSIS — I639 Cerebral infarction, unspecified: Secondary | ICD-10-CM | POA: Diagnosis not present

## 2023-04-28 DIAGNOSIS — R1312 Dysphagia, oropharyngeal phase: Secondary | ICD-10-CM | POA: Diagnosis not present

## 2023-04-28 DIAGNOSIS — R262 Difficulty in walking, not elsewhere classified: Secondary | ICD-10-CM | POA: Diagnosis not present

## 2023-04-28 DIAGNOSIS — R1311 Dysphagia, oral phase: Secondary | ICD-10-CM | POA: Diagnosis not present

## 2023-04-28 DIAGNOSIS — M6281 Muscle weakness (generalized): Secondary | ICD-10-CM | POA: Diagnosis not present

## 2023-04-29 DIAGNOSIS — R1312 Dysphagia, oropharyngeal phase: Secondary | ICD-10-CM | POA: Diagnosis not present

## 2023-04-29 DIAGNOSIS — R262 Difficulty in walking, not elsewhere classified: Secondary | ICD-10-CM | POA: Diagnosis not present

## 2023-04-29 DIAGNOSIS — R2681 Unsteadiness on feet: Secondary | ICD-10-CM | POA: Diagnosis not present

## 2023-04-29 DIAGNOSIS — M6281 Muscle weakness (generalized): Secondary | ICD-10-CM | POA: Diagnosis not present

## 2023-04-29 DIAGNOSIS — I639 Cerebral infarction, unspecified: Secondary | ICD-10-CM | POA: Diagnosis not present

## 2023-04-29 DIAGNOSIS — R1311 Dysphagia, oral phase: Secondary | ICD-10-CM | POA: Diagnosis not present

## 2023-04-30 DIAGNOSIS — I639 Cerebral infarction, unspecified: Secondary | ICD-10-CM | POA: Diagnosis not present

## 2023-04-30 DIAGNOSIS — M6281 Muscle weakness (generalized): Secondary | ICD-10-CM | POA: Diagnosis not present

## 2023-04-30 DIAGNOSIS — R2681 Unsteadiness on feet: Secondary | ICD-10-CM | POA: Diagnosis not present

## 2023-04-30 DIAGNOSIS — R262 Difficulty in walking, not elsewhere classified: Secondary | ICD-10-CM | POA: Diagnosis not present

## 2023-04-30 DIAGNOSIS — R1312 Dysphagia, oropharyngeal phase: Secondary | ICD-10-CM | POA: Diagnosis not present

## 2023-04-30 DIAGNOSIS — R1311 Dysphagia, oral phase: Secondary | ICD-10-CM | POA: Diagnosis not present

## 2023-05-01 DIAGNOSIS — M6281 Muscle weakness (generalized): Secondary | ICD-10-CM | POA: Diagnosis not present

## 2023-05-01 DIAGNOSIS — R2681 Unsteadiness on feet: Secondary | ICD-10-CM | POA: Diagnosis not present

## 2023-05-01 DIAGNOSIS — R262 Difficulty in walking, not elsewhere classified: Secondary | ICD-10-CM | POA: Diagnosis not present

## 2023-05-01 DIAGNOSIS — R1312 Dysphagia, oropharyngeal phase: Secondary | ICD-10-CM | POA: Diagnosis not present

## 2023-05-01 DIAGNOSIS — R1311 Dysphagia, oral phase: Secondary | ICD-10-CM | POA: Diagnosis not present

## 2023-05-01 DIAGNOSIS — Z515 Encounter for palliative care: Secondary | ICD-10-CM | POA: Diagnosis not present

## 2023-05-01 DIAGNOSIS — I639 Cerebral infarction, unspecified: Secondary | ICD-10-CM | POA: Diagnosis not present

## 2023-05-01 DIAGNOSIS — Z7401 Bed confinement status: Secondary | ICD-10-CM | POA: Diagnosis not present

## 2023-05-01 DIAGNOSIS — L89620 Pressure ulcer of left heel, unstageable: Secondary | ICD-10-CM | POA: Diagnosis not present

## 2023-05-02 DIAGNOSIS — I639 Cerebral infarction, unspecified: Secondary | ICD-10-CM | POA: Diagnosis not present

## 2023-05-02 DIAGNOSIS — R1311 Dysphagia, oral phase: Secondary | ICD-10-CM | POA: Diagnosis not present

## 2023-05-02 DIAGNOSIS — R1312 Dysphagia, oropharyngeal phase: Secondary | ICD-10-CM | POA: Diagnosis not present

## 2023-05-02 DIAGNOSIS — R2681 Unsteadiness on feet: Secondary | ICD-10-CM | POA: Diagnosis not present

## 2023-05-02 DIAGNOSIS — R262 Difficulty in walking, not elsewhere classified: Secondary | ICD-10-CM | POA: Diagnosis not present

## 2023-05-02 DIAGNOSIS — M6281 Muscle weakness (generalized): Secondary | ICD-10-CM | POA: Diagnosis not present

## 2023-05-04 DIAGNOSIS — M79652 Pain in left thigh: Secondary | ICD-10-CM | POA: Diagnosis not present

## 2023-05-04 DIAGNOSIS — M25552 Pain in left hip: Secondary | ICD-10-CM | POA: Diagnosis not present

## 2023-05-04 DIAGNOSIS — M79605 Pain in left leg: Secondary | ICD-10-CM | POA: Diagnosis not present

## 2023-05-04 DIAGNOSIS — M25562 Pain in left knee: Secondary | ICD-10-CM | POA: Diagnosis not present

## 2023-05-05 DIAGNOSIS — R1311 Dysphagia, oral phase: Secondary | ICD-10-CM | POA: Diagnosis not present

## 2023-05-05 DIAGNOSIS — R2681 Unsteadiness on feet: Secondary | ICD-10-CM | POA: Diagnosis not present

## 2023-05-05 DIAGNOSIS — M6281 Muscle weakness (generalized): Secondary | ICD-10-CM | POA: Diagnosis not present

## 2023-05-05 DIAGNOSIS — R1312 Dysphagia, oropharyngeal phase: Secondary | ICD-10-CM | POA: Diagnosis not present

## 2023-05-05 DIAGNOSIS — R262 Difficulty in walking, not elsewhere classified: Secondary | ICD-10-CM | POA: Diagnosis not present

## 2023-05-05 DIAGNOSIS — R0989 Other specified symptoms and signs involving the circulatory and respiratory systems: Secondary | ICD-10-CM | POA: Diagnosis not present

## 2023-05-05 DIAGNOSIS — I639 Cerebral infarction, unspecified: Secondary | ICD-10-CM | POA: Diagnosis not present

## 2023-05-06 DIAGNOSIS — I10 Essential (primary) hypertension: Secondary | ICD-10-CM | POA: Diagnosis not present

## 2023-05-06 DIAGNOSIS — E785 Hyperlipidemia, unspecified: Secondary | ICD-10-CM | POA: Diagnosis not present

## 2023-05-06 DIAGNOSIS — R262 Difficulty in walking, not elsewhere classified: Secondary | ICD-10-CM | POA: Diagnosis not present

## 2023-05-06 DIAGNOSIS — M6281 Muscle weakness (generalized): Secondary | ICD-10-CM | POA: Diagnosis not present

## 2023-05-06 DIAGNOSIS — I69322 Dysarthria following cerebral infarction: Secondary | ICD-10-CM | POA: Diagnosis not present

## 2023-05-06 DIAGNOSIS — I70244 Atherosclerosis of native arteries of left leg with ulceration of heel and midfoot: Secondary | ICD-10-CM | POA: Diagnosis not present

## 2023-05-06 DIAGNOSIS — R2681 Unsteadiness on feet: Secondary | ICD-10-CM | POA: Diagnosis not present

## 2023-05-06 DIAGNOSIS — R1312 Dysphagia, oropharyngeal phase: Secondary | ICD-10-CM | POA: Diagnosis not present

## 2023-05-06 DIAGNOSIS — R1311 Dysphagia, oral phase: Secondary | ICD-10-CM | POA: Diagnosis not present

## 2023-05-06 DIAGNOSIS — I69354 Hemiplegia and hemiparesis following cerebral infarction affecting left non-dominant side: Secondary | ICD-10-CM | POA: Diagnosis not present

## 2023-05-06 DIAGNOSIS — I639 Cerebral infarction, unspecified: Secondary | ICD-10-CM | POA: Diagnosis not present

## 2023-05-06 DIAGNOSIS — I739 Peripheral vascular disease, unspecified: Secondary | ICD-10-CM | POA: Diagnosis not present

## 2023-05-07 DIAGNOSIS — I639 Cerebral infarction, unspecified: Secondary | ICD-10-CM | POA: Diagnosis not present

## 2023-05-07 DIAGNOSIS — M6281 Muscle weakness (generalized): Secondary | ICD-10-CM | POA: Diagnosis not present

## 2023-05-07 DIAGNOSIS — R262 Difficulty in walking, not elsewhere classified: Secondary | ICD-10-CM | POA: Diagnosis not present

## 2023-05-07 DIAGNOSIS — R1312 Dysphagia, oropharyngeal phase: Secondary | ICD-10-CM | POA: Diagnosis not present

## 2023-05-07 DIAGNOSIS — R1311 Dysphagia, oral phase: Secondary | ICD-10-CM | POA: Diagnosis not present

## 2023-05-07 DIAGNOSIS — R2681 Unsteadiness on feet: Secondary | ICD-10-CM | POA: Diagnosis not present

## 2023-05-08 DIAGNOSIS — M6281 Muscle weakness (generalized): Secondary | ICD-10-CM | POA: Diagnosis not present

## 2023-05-08 DIAGNOSIS — R1312 Dysphagia, oropharyngeal phase: Secondary | ICD-10-CM | POA: Diagnosis not present

## 2023-05-08 DIAGNOSIS — R2681 Unsteadiness on feet: Secondary | ICD-10-CM | POA: Diagnosis not present

## 2023-05-08 DIAGNOSIS — R262 Difficulty in walking, not elsewhere classified: Secondary | ICD-10-CM | POA: Diagnosis not present

## 2023-05-08 DIAGNOSIS — I639 Cerebral infarction, unspecified: Secondary | ICD-10-CM | POA: Diagnosis not present

## 2023-05-08 DIAGNOSIS — R1311 Dysphagia, oral phase: Secondary | ICD-10-CM | POA: Diagnosis not present

## 2023-05-09 DIAGNOSIS — R1311 Dysphagia, oral phase: Secondary | ICD-10-CM | POA: Diagnosis not present

## 2023-05-09 DIAGNOSIS — I639 Cerebral infarction, unspecified: Secondary | ICD-10-CM | POA: Diagnosis not present

## 2023-05-09 DIAGNOSIS — R262 Difficulty in walking, not elsewhere classified: Secondary | ICD-10-CM | POA: Diagnosis not present

## 2023-05-09 DIAGNOSIS — R2681 Unsteadiness on feet: Secondary | ICD-10-CM | POA: Diagnosis not present

## 2023-05-09 DIAGNOSIS — R1312 Dysphagia, oropharyngeal phase: Secondary | ICD-10-CM | POA: Diagnosis not present

## 2023-05-09 DIAGNOSIS — M6281 Muscle weakness (generalized): Secondary | ICD-10-CM | POA: Diagnosis not present

## 2023-05-12 DIAGNOSIS — M6281 Muscle weakness (generalized): Secondary | ICD-10-CM | POA: Diagnosis not present

## 2023-05-12 DIAGNOSIS — R2681 Unsteadiness on feet: Secondary | ICD-10-CM | POA: Diagnosis not present

## 2023-05-12 DIAGNOSIS — R1311 Dysphagia, oral phase: Secondary | ICD-10-CM | POA: Diagnosis not present

## 2023-05-12 DIAGNOSIS — I639 Cerebral infarction, unspecified: Secondary | ICD-10-CM | POA: Diagnosis not present

## 2023-05-12 DIAGNOSIS — R1312 Dysphagia, oropharyngeal phase: Secondary | ICD-10-CM | POA: Diagnosis not present

## 2023-05-12 DIAGNOSIS — R262 Difficulty in walking, not elsewhere classified: Secondary | ICD-10-CM | POA: Diagnosis not present

## 2023-05-13 DIAGNOSIS — R1312 Dysphagia, oropharyngeal phase: Secondary | ICD-10-CM | POA: Diagnosis not present

## 2023-05-13 DIAGNOSIS — R2681 Unsteadiness on feet: Secondary | ICD-10-CM | POA: Diagnosis not present

## 2023-05-13 DIAGNOSIS — R262 Difficulty in walking, not elsewhere classified: Secondary | ICD-10-CM | POA: Diagnosis not present

## 2023-05-13 DIAGNOSIS — I70244 Atherosclerosis of native arteries of left leg with ulceration of heel and midfoot: Secondary | ICD-10-CM | POA: Diagnosis not present

## 2023-05-13 DIAGNOSIS — I639 Cerebral infarction, unspecified: Secondary | ICD-10-CM | POA: Diagnosis not present

## 2023-05-13 DIAGNOSIS — R1311 Dysphagia, oral phase: Secondary | ICD-10-CM | POA: Diagnosis not present

## 2023-05-13 DIAGNOSIS — M6281 Muscle weakness (generalized): Secondary | ICD-10-CM | POA: Diagnosis not present

## 2023-05-14 DIAGNOSIS — R2681 Unsteadiness on feet: Secondary | ICD-10-CM | POA: Diagnosis not present

## 2023-05-14 DIAGNOSIS — R1311 Dysphagia, oral phase: Secondary | ICD-10-CM | POA: Diagnosis not present

## 2023-05-14 DIAGNOSIS — R262 Difficulty in walking, not elsewhere classified: Secondary | ICD-10-CM | POA: Diagnosis not present

## 2023-05-14 DIAGNOSIS — R1312 Dysphagia, oropharyngeal phase: Secondary | ICD-10-CM | POA: Diagnosis not present

## 2023-05-14 DIAGNOSIS — I639 Cerebral infarction, unspecified: Secondary | ICD-10-CM | POA: Diagnosis not present

## 2023-05-14 DIAGNOSIS — M6281 Muscle weakness (generalized): Secondary | ICD-10-CM | POA: Diagnosis not present

## 2023-05-15 DIAGNOSIS — R1311 Dysphagia, oral phase: Secondary | ICD-10-CM | POA: Diagnosis not present

## 2023-05-15 DIAGNOSIS — I639 Cerebral infarction, unspecified: Secondary | ICD-10-CM | POA: Diagnosis not present

## 2023-05-15 DIAGNOSIS — R262 Difficulty in walking, not elsewhere classified: Secondary | ICD-10-CM | POA: Diagnosis not present

## 2023-05-15 DIAGNOSIS — R2681 Unsteadiness on feet: Secondary | ICD-10-CM | POA: Diagnosis not present

## 2023-05-15 DIAGNOSIS — R1312 Dysphagia, oropharyngeal phase: Secondary | ICD-10-CM | POA: Diagnosis not present

## 2023-05-15 DIAGNOSIS — M6281 Muscle weakness (generalized): Secondary | ICD-10-CM | POA: Diagnosis not present

## 2023-05-16 DIAGNOSIS — M6281 Muscle weakness (generalized): Secondary | ICD-10-CM | POA: Diagnosis not present

## 2023-05-16 DIAGNOSIS — R1312 Dysphagia, oropharyngeal phase: Secondary | ICD-10-CM | POA: Diagnosis not present

## 2023-05-16 DIAGNOSIS — R2681 Unsteadiness on feet: Secondary | ICD-10-CM | POA: Diagnosis not present

## 2023-05-16 DIAGNOSIS — R262 Difficulty in walking, not elsewhere classified: Secondary | ICD-10-CM | POA: Diagnosis not present

## 2023-05-16 DIAGNOSIS — I639 Cerebral infarction, unspecified: Secondary | ICD-10-CM | POA: Diagnosis not present

## 2023-05-16 DIAGNOSIS — R1311 Dysphagia, oral phase: Secondary | ICD-10-CM | POA: Diagnosis not present

## 2023-05-18 ENCOUNTER — Observation Stay (HOSPITAL_COMMUNITY): Payer: Medicare Other

## 2023-05-18 ENCOUNTER — Inpatient Hospital Stay (HOSPITAL_COMMUNITY)
Admission: EM | Admit: 2023-05-18 | Discharge: 2023-05-21 | DRG: 064 | Disposition: A | Discharge status: Hospice | Payer: Medicare Other | Attending: Internal Medicine | Admitting: Internal Medicine

## 2023-05-18 ENCOUNTER — Other Ambulatory Visit: Payer: Self-pay

## 2023-05-18 ENCOUNTER — Emergency Department (HOSPITAL_COMMUNITY): Payer: Medicare Other

## 2023-05-18 ENCOUNTER — Other Ambulatory Visit (HOSPITAL_COMMUNITY): Payer: Medicare Other

## 2023-05-18 ENCOUNTER — Encounter (HOSPITAL_COMMUNITY): Payer: Self-pay | Admitting: Internal Medicine

## 2023-05-18 DIAGNOSIS — Z87891 Personal history of nicotine dependence: Secondary | ICD-10-CM

## 2023-05-18 DIAGNOSIS — Z515 Encounter for palliative care: Secondary | ICD-10-CM

## 2023-05-18 DIAGNOSIS — F0150 Vascular dementia without behavioral disturbance: Secondary | ICD-10-CM | POA: Diagnosis present

## 2023-05-18 DIAGNOSIS — E870 Hyperosmolality and hypernatremia: Secondary | ICD-10-CM | POA: Diagnosis present

## 2023-05-18 DIAGNOSIS — I618 Other nontraumatic intracerebral hemorrhage: Principal | ICD-10-CM | POA: Diagnosis present

## 2023-05-18 DIAGNOSIS — R29726 NIHSS score 26: Secondary | ICD-10-CM | POA: Diagnosis not present

## 2023-05-18 DIAGNOSIS — L89620 Pressure ulcer of left heel, unstageable: Secondary | ICD-10-CM | POA: Diagnosis not present

## 2023-05-18 DIAGNOSIS — L89896 Pressure-induced deep tissue damage of other site: Secondary | ICD-10-CM | POA: Diagnosis not present

## 2023-05-18 DIAGNOSIS — E860 Dehydration: Secondary | ICD-10-CM | POA: Diagnosis not present

## 2023-05-18 DIAGNOSIS — R509 Fever, unspecified: Secondary | ICD-10-CM | POA: Diagnosis not present

## 2023-05-18 DIAGNOSIS — Z7982 Long term (current) use of aspirin: Secondary | ICD-10-CM

## 2023-05-18 DIAGNOSIS — R1310 Dysphagia, unspecified: Secondary | ICD-10-CM | POA: Diagnosis present

## 2023-05-18 DIAGNOSIS — R29818 Other symptoms and signs involving the nervous system: Secondary | ICD-10-CM | POA: Diagnosis present

## 2023-05-18 DIAGNOSIS — R4701 Aphasia: Secondary | ICD-10-CM | POA: Diagnosis not present

## 2023-05-18 DIAGNOSIS — I6930 Unspecified sequelae of cerebral infarction: Secondary | ICD-10-CM | POA: Diagnosis not present

## 2023-05-18 DIAGNOSIS — Z7902 Long term (current) use of antithrombotics/antiplatelets: Secondary | ICD-10-CM | POA: Diagnosis not present

## 2023-05-18 DIAGNOSIS — G319 Degenerative disease of nervous system, unspecified: Secondary | ICD-10-CM | POA: Diagnosis not present

## 2023-05-18 DIAGNOSIS — I6782 Cerebral ischemia: Secondary | ICD-10-CM | POA: Diagnosis not present

## 2023-05-18 DIAGNOSIS — I119 Hypertensive heart disease without heart failure: Secondary | ICD-10-CM | POA: Diagnosis not present

## 2023-05-18 DIAGNOSIS — I6389 Other cerebral infarction: Secondary | ICD-10-CM | POA: Diagnosis not present

## 2023-05-18 DIAGNOSIS — Z743 Need for continuous supervision: Secondary | ICD-10-CM | POA: Diagnosis not present

## 2023-05-18 DIAGNOSIS — Z833 Family history of diabetes mellitus: Secondary | ICD-10-CM

## 2023-05-18 DIAGNOSIS — Z79899 Other long term (current) drug therapy: Secondary | ICD-10-CM

## 2023-05-18 DIAGNOSIS — R531 Weakness: Secondary | ICD-10-CM | POA: Diagnosis not present

## 2023-05-18 DIAGNOSIS — I69320 Aphasia following cerebral infarction: Secondary | ICD-10-CM | POA: Diagnosis not present

## 2023-05-18 DIAGNOSIS — Z66 Do not resuscitate: Secondary | ICD-10-CM | POA: Diagnosis not present

## 2023-05-18 DIAGNOSIS — Z91013 Allergy to seafood: Secondary | ICD-10-CM

## 2023-05-18 DIAGNOSIS — Z1152 Encounter for screening for COVID-19: Secondary | ICD-10-CM | POA: Diagnosis not present

## 2023-05-18 DIAGNOSIS — I10 Essential (primary) hypertension: Secondary | ICD-10-CM | POA: Diagnosis not present

## 2023-05-18 DIAGNOSIS — G9389 Other specified disorders of brain: Secondary | ICD-10-CM | POA: Diagnosis not present

## 2023-05-18 DIAGNOSIS — I63511 Cerebral infarction due to unspecified occlusion or stenosis of right middle cerebral artery: Secondary | ICD-10-CM | POA: Diagnosis not present

## 2023-05-18 DIAGNOSIS — L409 Psoriasis, unspecified: Secondary | ICD-10-CM | POA: Diagnosis not present

## 2023-05-18 DIAGNOSIS — R000 Tachycardia, unspecified: Secondary | ICD-10-CM | POA: Diagnosis not present

## 2023-05-18 DIAGNOSIS — R404 Transient alteration of awareness: Secondary | ICD-10-CM | POA: Diagnosis not present

## 2023-05-18 DIAGNOSIS — I69354 Hemiplegia and hemiparesis following cerebral infarction affecting left non-dominant side: Secondary | ICD-10-CM | POA: Diagnosis not present

## 2023-05-18 DIAGNOSIS — I69391 Dysphagia following cerebral infarction: Secondary | ICD-10-CM

## 2023-05-18 DIAGNOSIS — E785 Hyperlipidemia, unspecified: Secondary | ICD-10-CM | POA: Diagnosis not present

## 2023-05-18 DIAGNOSIS — I452 Bifascicular block: Secondary | ICD-10-CM | POA: Diagnosis not present

## 2023-05-18 DIAGNOSIS — E43 Unspecified severe protein-calorie malnutrition: Secondary | ICD-10-CM | POA: Diagnosis present

## 2023-05-18 LAB — CBG MONITORING, ED: Glucose-Capillary: 132 mg/dL — ABNORMAL HIGH (ref 70–99)

## 2023-05-18 LAB — DIFFERENTIAL
Abs Immature Granulocytes: 0.04 10*3/uL (ref 0.00–0.07)
Basophils Absolute: 0 10*3/uL (ref 0.0–0.1)
Basophils Relative: 0 %
Eosinophils Absolute: 0 10*3/uL (ref 0.0–0.5)
Eosinophils Relative: 0 %
Immature Granulocytes: 0 %
Lymphocytes Relative: 15 %
Lymphs Abs: 1.6 10*3/uL (ref 0.7–4.0)
Monocytes Absolute: 0.9 10*3/uL (ref 0.1–1.0)
Monocytes Relative: 9 %
Neutro Abs: 7.6 10*3/uL (ref 1.7–7.7)
Neutrophils Relative %: 76 %

## 2023-05-18 LAB — CBC
HCT: 48.9 % (ref 39.0–52.0)
Hemoglobin: 14.9 g/dL (ref 13.0–17.0)
MCH: 26.3 pg (ref 26.0–34.0)
MCHC: 30.5 g/dL (ref 30.0–36.0)
MCV: 86.2 fL (ref 80.0–100.0)
Platelets: 262 10*3/uL (ref 150–400)
RBC: 5.67 MIL/uL (ref 4.22–5.81)
RDW: 14.4 % (ref 11.5–15.5)
WBC: 10.1 10*3/uL (ref 4.0–10.5)
nRBC: 0 % (ref 0.0–0.2)

## 2023-05-18 LAB — I-STAT CHEM 8, ED
BUN: 44 mg/dL — ABNORMAL HIGH (ref 8–23)
Calcium, Ion: 1.07 mmol/L — ABNORMAL LOW (ref 1.15–1.40)
Chloride: 115 mmol/L — ABNORMAL HIGH (ref 98–111)
Creatinine, Ser: 1 mg/dL (ref 0.61–1.24)
Glucose, Bld: 135 mg/dL — ABNORMAL HIGH (ref 70–99)
HCT: 46 % (ref 39.0–52.0)
Hemoglobin: 15.6 g/dL (ref 13.0–17.0)
Potassium: 4.6 mmol/L (ref 3.5–5.1)
Sodium: 151 mmol/L — ABNORMAL HIGH (ref 135–145)
TCO2: 29 mmol/L (ref 22–32)

## 2023-05-18 LAB — RESPIRATORY PANEL BY PCR

## 2023-05-18 LAB — COMPREHENSIVE METABOLIC PANEL
ALT: 38 U/L (ref 0–44)
AST: 48 U/L — ABNORMAL HIGH (ref 15–41)
Albumin: 2.7 g/dL — ABNORMAL LOW (ref 3.5–5.0)
Alkaline Phosphatase: 109 U/L (ref 38–126)
Anion gap: 14 (ref 5–15)
BUN: 36 mg/dL — ABNORMAL HIGH (ref 8–23)
CO2: 23 mmol/L (ref 22–32)
Calcium: 8.6 mg/dL — ABNORMAL LOW (ref 8.9–10.3)
Chloride: 111 mmol/L (ref 98–111)
Creatinine, Ser: 1.08 mg/dL (ref 0.61–1.24)
GFR, Estimated: >60 mL/min (ref >60)
Glucose, Bld: 136 mg/dL — ABNORMAL HIGH (ref 70–99)
Potassium: 4.4 mmol/L (ref 3.5–5.1)
Sodium: 148 mmol/L — ABNORMAL HIGH (ref 135–145)
Total Bilirubin: 1 mg/dL (ref 0.3–1.2)
Total Protein: 7.2 g/dL (ref 6.5–8.1)

## 2023-05-18 LAB — PROTIME-INR
INR: 1.1 (ref 0.8–1.2)
Prothrombin Time: 14.6 s (ref 11.4–15.2)

## 2023-05-18 LAB — RESP PANEL BY RT-PCR (RSV, FLU A&B, COVID)  RVPGX2
Influenza A by PCR: NEGATIVE
Influenza B by PCR: NEGATIVE
Resp Syncytial Virus by PCR: NEGATIVE
SARS Coronavirus 2 by RT PCR: NEGATIVE

## 2023-05-18 LAB — APTT: aPTT: 25 s (ref 24–36)

## 2023-05-18 LAB — ETHANOL: Alcohol, Ethyl (B): <10 mg/dL (ref <10)

## 2023-05-18 MED ORDER — LORAZEPAM 2 MG/ML IJ SOLN
0.5000 mg | Freq: Once | INTRAMUSCULAR | Status: AC | PRN
  Administered 2023-05-18: 0.5 mg via INTRAVENOUS
  Filled 2023-05-18: qty 1

## 2023-05-18 MED ORDER — DIVALPROEX SODIUM 125 MG PO CSDR
125.0000 mg | DELAYED_RELEASE_CAPSULE | Freq: Two times a day (BID) | ORAL | Status: DC
  Administered 2023-05-19: 125 mg via ORAL
  Filled 2023-05-18 (×2): qty 1

## 2023-05-18 MED ORDER — PAROXETINE HCL 10 MG PO TABS
10.0000 mg | ORAL_TABLET | Freq: Every day | ORAL | Status: DC
  Administered 2023-05-19: 10 mg via ORAL
  Filled 2023-05-18 (×5): qty 1

## 2023-05-18 MED ORDER — POLYETHYLENE GLYCOL 3350 17 G PO PACK: 17.0000 g | PACK | Freq: Every day | ORAL | Status: DC | PRN

## 2023-05-18 MED ORDER — ACETAMINOPHEN 650 MG RE SUPP: 650.0000 mg | Freq: Four times a day (QID) | RECTAL | Status: DC | PRN

## 2023-05-18 MED ORDER — SODIUM CHLORIDE 0.9% FLUSH
3.0000 mL | Freq: Once | INTRAVENOUS | Status: AC
  Administered 2023-05-18: 3 mL via INTRAVENOUS

## 2023-05-18 MED ORDER — SODIUM CHLORIDE 0.9% FLUSH
3.0000 mL | Freq: Two times a day (BID) | INTRAVENOUS | Status: DC
  Administered 2023-05-18 (×2): 3 mL via INTRAVENOUS

## 2023-05-18 MED ORDER — ATORVASTATIN CALCIUM 40 MG PO TABS: 40.0000 mg | ORAL_TABLET | Freq: Every day | ORAL | Status: DC

## 2023-05-18 MED ORDER — ACETAMINOPHEN 325 MG PO TABS: 650.0000 mg | ORAL_TABLET | Freq: Four times a day (QID) | ORAL | Status: DC | PRN

## 2023-05-18 NOTE — H&P (Signed)
History and Physical   [name] NUU:[id number] DOB: [birth year] DOA: 05/18/2023  PCP: Clovis [name], L.August Saucer, MD   Patient coming from: [facility]  Chief Complaint: Aphasia, weakness  HPI: [name] is a 67 y.o. male with medical history significant of posterior dimension, stroke, hypertension, hyperlipidemia presenting after worsening mental status at rehab facility.  History obtained with assistance of chart review.  Patient was admitted in July of this year and found to have significant right MCA infarct.  Discharged with left-sided deficits including left hemiparesis and right-sided gaze preference.  Had a baseline been able to communicate and joke at facility.  Today completely aphasic and noncommunicative with increased weakness.  Patient unable to assist in review of systems due to aphasia.  ED Course: Vital signs in the ED notable for fever of 100.8, blood pressure in the 140 systolic.  Lab workup included CMP with sodium 140, BUN 36, glucose 136, calcium 8.6, albumin 2.7.  CBC within normal limits.  PT, PTT, INR within normal limits.  Respiratory panel for flu COVID and RSV negative.  Ethanol level negative.  Urinalysis pending.  Chest x-ray without acute abnormality.  CT head showed multifocal petechial hemorrhagic transformation of prior large right MCA infarct.  Stable disease noted otherwise.  Neurology consulted on arrival and are following.  Review of Systems: Patient unable to assist in review of systems due to aphasia.  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 His smoking use included cigarettes. He started smoking about 47 years ago. He has a 9.3 pack-year smoking history. He has never used smokeless tobacco. He reports current alcohol use of about 17.0 standard drinks of alcohol per week. He reports current drug use. Drug: Cocaine.  Allergies  Allergen Reactions   Shellfish Allergy Itching and Other (See Comments)    Shrimp, especially    Family History  Problem Relation Age of Onset   Diabetes Mother   Reviewed on admission  Prior to Admission medications   Medication Sig Start Date End Date Taking? Authorizing Provider  Amino Acids-Protein Hydrolys (PRO-STAT AWC) LIQD Take 30 mLs by mouth 2 (two) times daily.   Yes [provider]  aspirin EC 81 MG tablet Take 1 tablet (81 mg total) by mouth daily. Swallow whole. Patient taking differently: Take 162 mg by mouth daily. 04/08/23 07/07/23 Yes Dorcas Carrow, MD  atorvastatin (LIPITOR) 40 MG tablet Take 40 mg by mouth at bedtime.   Yes [provider]  clopidogrel (PLAVIX) 75 MG tablet Take 1 tablet (75 mg total) by mouth daily. 04/08/23 07/07/23 Yes Dorcas Carrow, MD  divalproex (DEPAKOTE SPRINKLE) 125 MG capsule Take 125 mg by mouth 2 (two) times daily. 03/07/23  Yes [provider]  metoprolol tartrate (LOPRESSOR) 25  MG tablet Take 1 tablet (25 mg total) by mouth 2 (two) times daily. 04/08/23 05/18/23 Yes Ghimire, Lyndel Safe, MD  PARoxetine (PAXIL) 10 MG tablet Take 1 tablet (10 mg total) by mouth at bedtime. 04/08/23 05/18/23 Yes Ghimire, Lyndel Safe, MD  senna-docusate (SENOKOT-S) 8.6-50 MG tablet Take 1 tablet by mouth 2 (two) times daily. 09/16/19  Yes Glade Lloyd, MD   Vitamin D, Ergocalciferol, (DRISDOL) 1.25 MG (50000 UNIT) CAPS capsule Take 50,000 Units by mouth every 7 (seven) days. Given on Mondays 03/17/23  Yes [provider]    Physical Exam: Vitals:   05/18/23 0900 05/18/23 0940 05/18/23 1011  BP:  (!) 144/98   Pulse:  100   Resp:  16   Temp:  97.8 F (36.6 C) (!) 100.8 F (38.2 C)  TempSrc:  Oral Rectal  SpO2:  98%   Weight: 65 kg      Physical Exam Constitutional:      General: He is not in acute distress.    Appearance: Normal appearance.  HENT:     Head: Normocephalic and atraumatic.     Mouth/Throat:     Mouth: Mucous membranes are moist.     Pharynx: Oropharynx is clear.  Eyes:     Extraocular Movements: Extraocular movements intact.     Pupils: Pupils are equal, round, and reactive to light.  Cardiovascular:     Rate and Rhythm: Normal rate and regular rhythm.     Pulses: Normal pulses.     Heart sounds: Normal heart sounds.  Pulmonary:     Effort: Pulmonary effort is normal. No respiratory distress.     Breath sounds: Normal breath sounds.  Abdominal:     General: Bowel sounds are normal. There is no distension.     Palpations: Abdomen is soft.     Tenderness: There is no abdominal tenderness.  Musculoskeletal:        General: No swelling or deformity.  Skin:    General: Skin is warm and dry.  Neurological:     Comments: Patient unable to significantly participate in exam due to aphasia and AMS.  Opened eyes for me when I was on his right side, but would not follow commands for me.    Labs on Admission: I have personally reviewed following labs and imaging studies  CBC: Recent Labs  Lab 05/18/23 0937 05/18/23 0947  WBC 10.1  --   NEUTROABS 7.6  --   HGB 14.9 15.6  HCT 48.9 46.0  MCV 86.2  --   PLT 262  --     Basic Metabolic Panel: Recent Labs  Lab 05/18/23 0937 05/18/23 0947  NA 148* 151*  K 4.4 4.6  CL 111 115*  CO2 23  --   GLUCOSE 136* 135*  BUN 36* 44*  CREATININE 1.08 1.00   CALCIUM 8.6*  --     GFR: Estimated Creatinine Clearance: 66.8 mL/min (by C-G formula based on SCr of 1 mg/dL).  Liver Function Tests: Recent Labs  Lab 05/18/23 0937  AST 48*  ALT 38  ALKPHOS 109  BILITOT 1.0  PROT 7.2  ALBUMIN 2.7*    Urine analysis:    Component Value Date/Time   COLORURINE STRAW (A) 04/03/2023 2053   APPEARANCEUR CLEAR 04/03/2023 2053   LABSPEC 1.019 04/03/2023 2053   PHURINE 7.0 04/03/2023 2053   GLUCOSEU NEGATIVE 04/03/2023 2053   HGBUR MODERATE (A) 04/03/2023 2053   BILIRUBINUR NEGATIVE 04/03/2023 2053   KETONESUR NEGATIVE 04/03/2023 2053   PROTEINUR NEGATIVE 04/03/2023  2053   UROBILINOGEN 0.2 02/04/2013 1315   NITRITE NEGATIVE 04/03/2023 2053   LEUKOCYTESUR NEGATIVE 04/03/2023 2053    Radiological Exams on Admission: DG Chest Portable 1 View  Result Date: 05/18/2023 CLINICAL DATA:  Fever. EXAM: PORTABLE CHEST 1 VIEW COMPARISON:  04/06/2023 FINDINGS: The lungs are clear without focal pneumonia, edema, pneumothorax or pleural effusion. The cardiopericardial silhouette is within normal limits for size. No acute bony abnormality. Telemetry leads overlie the chest. IMPRESSION: No active disease. Electronically Signed   By: Kennith Center M.D.   On: 05/18/2023 11:11   CT HEAD CODE STROKE WO CONTRAST  Result Date: 05/18/2023 CLINICAL DATA:  Code stroke. 67 year old male status post code stroke presentation last month with large right MCA territory infarct at that time. EXAM: CT HEAD WITHOUT CONTRAST TECHNIQUE: Contiguous axial images were obtained from the base of the skull through the vertex without intravenous contrast. RADIATION DOSE REDUCTION: This exam was performed according to the departmental dose-optimization program which includes automated exposure control, adjustment of the mA and/or kV according to patient size and/or use of iterative reconstruction technique. COMPARISON:  Brain MRI 04/03/2023 and earlier. FINDINGS: Brain: Evolving large right  MCA territory infarct with petechial hemorrhagic transformation in multiple areas. But there is decreased mass effect on the right lateral ventricle and no midline shift. No intraventricular hemorrhage. No basilar cistern hemorrhage. Underlying chronic severe encephalomalacia in the other major vascular territories of the bilateral cerebral and cerebellar hemispheres, stable. Vascular: Calcified atherosclerosis at the skull base. No suspicious intracranial vascular hyperdensity. Skull: No acute osseous abnormality identified. Sinuses/Orbits: Visualized paranasal sinuses and mastoids are stable and well aerated. Other: Slight leftward gaze now. Visualized scalp soft tissues are within normal limits. ASPECTS [facility] Stroke Program Early CT Score) Total score (0-10 with 10 being normal): 10 when allowing for previous infarcts, petechial intracranial hemorrhage is present. IMPRESSION: 1. Multifocal petechial hemorrhagic transformation of the large Right MCA infarct last month. No significant intracranial mass effect. No malignant hemorrhage or convincing extra-axial blood. 2. Other bilateral cerebral and cerebellar severe chronic ischemic disease appears stable. 3. These results were communicated to Dr. Derry Lory at 9:53 am on 05/18/2023 by text page via the [facility] messaging system. Electronically Signed   By: Odessa Fleming M.D.   On: 05/18/2023 09:54    EKG: Independently reviewed.  Sinus tachycardia 105 bpm.  Right bundle blanch block.  Nonspecific T wave changes.    Assessment/Plan Principal Problem:   Acute focal neurological deficit Active Problems:   History of CVA with residual deficit   HTN (hypertension)   Hyperlipidemia   Vascular dementia (HCC)   Acute focal neurologic deficit History of CVA New CVA versus evolution of prior CVA Rule out infection and recrudescence > Presenting with acute onset of aphasia today with worsening weakness. > As per HPI about 2 months ago had large right MCA infarct  with significant left-sided deficits and right gaze preference.  Has been able to communicate and joke at facility until this morning.  Now densely aphasic. Following some commands on the right for neurology. > CT head showed multifocal petechial hemorrhagic transformation of prior infarct. > Fever to 100.8.  No leukocytosis.  Normal chest x-ray.  Urinalysis pending.   > No significant electrolyte derangement in ED. > Respiratory panel for flu COVID RSV negative.  Possibly secondary to this hemorrhagic conversion but will need to further rule out infectious etiology. > Neurology consulted on arrival and are following. - Appreciate neurology recommendations and assistance - Monitor on  progressive unit for now - MRI brain - EEG - Hold aspirin, Plavix - Add on full respiratory viral panel to evaluate for viral etiology - Follow-up urinalysis - Trend fever curve WBC  Hypertension - Holding home antihypertensives in the setting of possible recurrent stroke  Hyperlipidemia - Continue atorvastatin when able  Vascular dementia - Continue home Paxil and Depakote when able  DVT prophylaxis: SCDs Code Status:   Full Family Communication:  Updated at bedside  Disposition Plan:   Patient is from:  [facility] [place] [facility]  Anticipated DC to:  Same as above  Anticipated DC date:  1 to 3 days  Anticipated DC barriers: None  Consults called:  Neurology Admission status:  Observation, progressive  Severity of Illness: The appropriate patient status for this patient is OBSERVATION. Observation status is judged to be reasonable and necessary in order to provide the required intensity of service to ensure the patient's safety. The patient's presenting symptoms, physical exam findings, and initial radiographic and laboratory data in the context of their medical condition is felt to place them at decreased risk for further clinical deterioration. Furthermore, it is anticipated that the patient will be medically  stable for discharge from the hospital within 2 midnights of admission.    Synetta Fail MD [facility]  How to contact the [facility] Attending or Consulting provider 7A - 7P or covering provider during after hours 7P -7A, for this patient?   Check the care team in [facility] and look for a) attending/consulting [facility] provider listed and b) the [facility] team listed Log into [email or web address] and use [place]'s universal password to access. If you do not have the password, please contact the hospital operator. Locate the [facility] provider you are looking for under [facility] and page to a number that you can be directly reached. If you still have difficulty reaching the provider, please page the [facility] (Director on Call) for the Hospitalists listed on amion for assistance.  05/18/2023, 12:28 PM

## 2023-05-18 NOTE — ED Triage Notes (Signed)
Pt bib ems from [facility] c/o aphasia and more weakness than normal. Hx CVA last month with left side deficits EMS reported pt usually flirt with nurses, follows commands, and has a good sense of humor. Pt LKW 05/17/23 18:00  BP 146/84 HR 104 RR 20 RA 96% CBG 137

## 2023-05-18 NOTE — Progress Notes (Signed)
   05/18/23 2100  Assess: MEWS Score  Temp 98.2 F (36.8 C)  BP (!) 131/101  MAP (mmHg) 111  Pulse Rate (!) 103  ECG Heart Rate (!) 102  Resp 14  Level of Consciousness Responds to Voice  SpO2 94 %  O2 Device Room Air  Assess: MEWS Score  MEWS Temp 0  MEWS Systolic 0  MEWS Pulse 1  MEWS RR 0  MEWS LOC 1  MEWS Score 2  MEWS Score Color Yellow  Assess: if the MEWS score is Yellow or Red  Were vital signs accurate and taken at a resting state? Yes  Does the patient meet 2 or more of the SIRS criteria? Yes  Does the patient have a confirmed or suspected source of infection? No  MEWS guidelines implemented  Yes, yellow  Treat  MEWS Interventions Considered administering scheduled or prn medications/treatments as ordered  Take Vital Signs  Increase Vital Sign Frequency  Yellow: Q2hr x1, continue Q4hrs until patient remains green for 12hrs  Escalate  MEWS: Escalate Yellow: Discuss with charge nurse and consider notifying provider and/or RRT  Notify: Charge Nurse/RN  Name of Charge Nurse/RN Notified Nikki,RN  Assess: SIRS CRITERIA  SIRS Temperature  0  SIRS Pulse 1  SIRS Respirations  0  SIRS WBC 0  SIRS Score Sum  1

## 2023-05-18 NOTE — ED Provider Notes (Signed)
[place] EMERGENCY DEPARTMENT AT [facility] Provider Note   CSN: [id number] Arrival date & time: 05/18/23  4098     History  No chief complaint on file.   [name] is a 67 y.o. male.  The history is provided by the patient, the EMS personnel and medical records. The history is limited by the condition of the patient. No language interpreter was used.  Neurologic Problem This is a new problem. The current episode started 12 to 24 hours ago. The problem occurs constantly. The problem has not changed since onset.Pertinent negatives include no chest pain, no abdominal pain, no headaches and no shortness of breath. Nothing aggravates the symptoms. Nothing relieves the symptoms. He has tried nothing for the symptoms. The treatment provided no relief.       Home Medications Prior to Admission medications   Medication Sig Start Date End Date Taking? Authorizing Provider  Amino Acids-Protein Hydrolys (PRO-STAT AWC) LIQD Take 30 mLs by mouth 2 (two) times daily.    [provider]  aspirin EC 81 MG tablet Take 1 tablet (81 mg total) by mouth daily. Swallow whole. 04/08/23 07/07/23  Dorcas Carrow, MD  atorvastatin (LIPITOR) 40 MG tablet Take 40 mg by mouth at bedtime.    [provider]  clopidogrel (PLAVIX) 75 MG tablet Take 1 tablet (75 mg total) by mouth daily. 04/08/23 07/07/23  Dorcas Carrow, MD  divalproex (DEPAKOTE SPRINKLE) 125 MG capsule Take 125 mg by mouth 2 (two) times daily. 03/07/23   [provider]  metoprolol tartrate (LOPRESSOR) 25 MG tablet Take 1 tablet (25 mg total) by mouth 2 (two) times daily. 04/08/23 05/08/23  Dorcas Carrow, MD  PARoxetine (PAXIL) 10 MG tablet Take 1 tablet (10 mg total) by mouth at bedtime. 04/08/23 05/08/23  Dorcas Carrow, MD  senna-docusate (SENOKOT-S) 8.6-50 MG tablet Take 1 tablet by mouth 2 (two) times daily. 09/16/19   Glade Lloyd, MD  Vitamin D, Ergocalciferol, (DRISDOL) 1.25 MG (50000 UNIT) CAPS  capsule Take 50,000 Units by mouth every 7 (seven) days. Given on Mondays 03/17/23   [provider]      Allergies    Shellfish allergy    Review of Systems   Review of Systems  Constitutional:  Negative for chills, fatigue and fever.  HENT:  Negative for congestion.   Respiratory:  Negative for chest tightness and shortness of breath.   Cardiovascular:  Negative for chest pain.  Gastrointestinal:  Negative for abdominal pain, nausea and vomiting.  Musculoskeletal:  Negative for back pain.  Skin:  Negative for rash.  Neurological:  Positive for speech difficulty and weakness. Negative for light-headedness and headaches.  Psychiatric/Behavioral:  Negative for agitation and confusion.   All other systems reviewed and are negative.   Physical Exam Updated Vital Signs There were no vitals taken for this visit. Physical Exam Vitals and nursing note reviewed.  Constitutional:      General: He is not in acute distress.    Appearance: He is well-developed. He is not ill-appearing, toxic-appearing or diaphoretic.  HENT:     Head: Normocephalic and atraumatic.     Nose: No congestion or rhinorrhea.  Eyes:     Conjunctiva/sclera: Conjunctivae normal.     Pupils: Pupils are equal, round, and reactive to light.     Comments: Patient would not move eyes left to midline  Cardiovascular:     Rate and Rhythm: Normal rate and regular rhythm.     Heart sounds: No murmur heard. Pulmonary:  Effort: Pulmonary effort is normal. No respiratory distress.     Breath sounds: Normal breath sounds.  Abdominal:     Palpations: Abdomen is soft.     Tenderness: There is no abdominal tenderness.  Musculoskeletal:        General: No swelling.     Cervical back: Neck supple.  Skin:    General: Skin is warm and dry.     Capillary Refill: Capillary refill takes less than 2 seconds.  Neurological:     Mental Status: He is alert.     Cranial Nerves: No facial asymmetry.     Motor: No tremor.      Comments: Weakness in left arm compared to right.  Would not move legs for me.  Did not have facial asymmetry on my initial exam.  Psychiatric:        Mood and Affect: Mood normal.     ED Results / Procedures / Treatments   Labs (all labs ordered are listed, but only abnormal results are displayed) Labs Reviewed  COMPREHENSIVE METABOLIC PANEL - Abnormal; Notable for the following components:      Result Value   Sodium 148 (*)    Glucose, Bld 136 (*)    BUN 36 (*)    Calcium 8.6 (*)    Albumin 2.7 (*)    AST 48 (*)    All other components within normal limits  I-STAT CHEM 8, ED - Abnormal; Notable for the following components:   Sodium 151 (*)    Chloride 115 (*)    BUN 44 (*)    Glucose, Bld 135 (*)    Calcium, Ion 1.07 (*)    All other components within normal limits  CBG MONITORING, ED - Abnormal; Notable for the following components:   Glucose-Capillary 132 (*)    All other components within normal limits  RESP PANEL BY RT-PCR (RSV, FLU A&B, COVID)  RVPGX2  RESPIRATORY PANEL BY PCR  MRSA NEXT GEN BY PCR, NASAL  PROTIME-INR  APTT  CBC  DIFFERENTIAL  ETHANOL  URINALYSIS, ROUTINE W REFLEX MICROSCOPIC    EKG EKG Interpretation Date/Time:  Sunday May 18 2023 10:03:58 EDT Ventricular Rate:  105 PR Interval:  129 QRS Duration:  129 QT Interval:  424 QTC Calculation: 561 R Axis:   -85  Text Interpretation: Sinus tachycardia RBBB and LAFB Probable lateral infarct, old when compared to prior, similar appearance with longer QTC. No STEMI Confirmed by Theda Belfast ([id number]) on 05/18/2023 10:33:24 AM  Radiology DG Chest Portable 1 View  Result Date: 05/18/2023 CLINICAL DATA:  Fever. EXAM: PORTABLE CHEST 1 VIEW COMPARISON:  04/06/2023 FINDINGS: The lungs are clear without focal pneumonia, edema, pneumothorax or pleural effusion. The cardiopericardial silhouette is within normal limits for size. No acute bony abnormality. Telemetry leads overlie the chest.  IMPRESSION: No active disease. Electronically Signed   By: Kennith Center M.D.   On: 05/18/2023 11:11   CT HEAD CODE STROKE WO CONTRAST  Result Date: 05/18/2023 CLINICAL DATA:  Code stroke. 67 year old male status post code stroke presentation last month with large right MCA territory infarct at that time. EXAM: CT HEAD WITHOUT CONTRAST TECHNIQUE: Contiguous axial images were obtained from the base of the skull through the vertex without intravenous contrast. RADIATION DOSE REDUCTION: This exam was performed according to the departmental dose-optimization program which includes automated exposure control, adjustment of the mA and/or kV according to patient size and/or use of iterative reconstruction technique. COMPARISON:  Brain MRI 04/03/2023 and earlier. FINDINGS:  Brain: Evolving large right MCA territory infarct with petechial hemorrhagic transformation in multiple areas. But there is decreased mass effect on the right lateral ventricle and no midline shift. No intraventricular hemorrhage. No basilar cistern hemorrhage. Underlying chronic severe encephalomalacia in the other major vascular territories of the bilateral cerebral and cerebellar hemispheres, stable. Vascular: Calcified atherosclerosis at the skull base. No suspicious intracranial vascular hyperdensity. Skull: No acute osseous abnormality identified. Sinuses/Orbits: Visualized paranasal sinuses and mastoids are stable and well aerated. Other: Slight leftward gaze now. Visualized scalp soft tissues are within normal limits. ASPECTS [facility] Stroke Program Early CT Score) Total score (0-10 with 10 being normal): 10 when allowing for previous infarcts, petechial intracranial hemorrhage is present. IMPRESSION: 1. Multifocal petechial hemorrhagic transformation of the large Right MCA infarct last month. No significant intracranial mass effect. No malignant hemorrhage or convincing extra-axial blood. 2. Other bilateral cerebral and cerebellar severe  chronic ischemic disease appears stable. 3. These results were communicated to Dr. Derry Lory at 9:53 am on 05/18/2023 by text page via the [facility] messaging system. Electronically Signed   By: Odessa Fleming M.D.   On: 05/18/2023 09:54    Procedures Procedures    Medications Ordered in ED Medications  atorvastatin (LIPITOR) tablet 40 mg (has no administration in time range)  PARoxetine (PAXIL) tablet 10 mg (has no administration in time range)  divalproex (DEPAKOTE SPRINKLE) capsule 125 mg (has no administration in time range)  sodium chloride flush (NS) 0.9 % injection 3 mL (has no administration in time range)  acetaminophen (TYLENOL) tablet 650 mg (has no administration in time range)    Or  acetaminophen (TYLENOL) suppository 650 mg (has no administration in time range)  polyethylene glycol (MIRALAX / GLYCOLAX) packet 17 g (has no administration in time range)  sodium chloride flush (NS) 0.9 % injection 3 mL (3 mLs Intravenous Given 05/18/23 1008)    ED Course/ Medical Decision Making/ A&P                                 Medical Decision Making Amount and/or Complexity of Data Reviewed Labs: ordered. Radiology: ordered.  Risk Decision regarding hospitalization.    Jvonte Berrian is a 67 y.o. male with a history of hypertension, previous substance abuse, vascular dementia, and previous stroke a month and a half ago who presents as a code stroke.  According to EMS report, patient is normally very verbal and was talking last normal 6 PM last night.  Today patient is completely aphasic and seems more weak than normal.  I saw patient after initial CT scan and airway clearance.  Patient has what appears to be hemorrhagic conversion.  On my exam, lungs clear and chest nontender.  Abdomen nontender.  Patient is able to squeeze his right hand but not squeeze the left side well.  Patient will not follow commands with his feet.  He is not speaking but is moving his eyes.  He could not move his eyes  to the left past the midline.  Pupils are actually both symmetric and reactive however.  Vital signs do show slight fever but otherwise there is no reported infectious symptoms.  Will get screening workup to look for occult infection but neurology recommends MRI and likely admit for EEG.  Will order MRI and will get the other urinalysis, chest x-ray and COVID test.  Anticipate admission for complete aphasia after workup is completed.  X-ray did not show pneumonia.  Will call for admission for further neurologic workup per neurology.         Final Clinical Impression(s) / ED Diagnoses Final diagnoses:  Aphasia     Clinical Impression: 1. Aphasia     Disposition: Admit  This note was prepared with assistance of Dragon voice recognition software. Occasional wrong-word or sound-a-like substitutions may have occurred due to the inherent limitations of voice recognition software.       Quadasia Newsham, Canary Brim, MD 05/18/23 [phone]

## 2023-05-18 NOTE — ED Notes (Signed)
ED TO INPATIENT HANDOFF REPORT  ED Nurse Name and Phone #: [phone]  S Name/Age/Gender [name] 67 y.o. male Room/Bed: 034C/034C  Code Status   Code Status: Full Code  Home/SNF/Other Skilled nursing facility Patient oriented to:  Is this baseline? No      Chief Complaint Acute focal neurological deficit [R29.818]  Triage Note Pt bib ems from [facility] c/o aphasia and more weakness than normal. Hx CVA last month with left side deficits EMS reported pt usually flirt with nurses, follows commands, and has a good sense of humor. Pt LKW 05/17/23 18:00  BP 146/84 HR 104 RR 20 RA 96% CBG 137   Allergies Allergies  Allergen Reactions   Shellfish Allergy Itching and Other (See Comments)    Shrimp, especially    Level of Care/Admitting Diagnosis ED Disposition     ED Disposition  Admit   Condition  --   Comment  Hospital Area: [facility] [[id number]]  Level of Care: Progressive [102]  Admit to Progressive based on following criteria: NEUROLOGICAL AND NEUROSURGICAL complex patients with significant risk of instability, who do not meet ICU criteria, yet require close observation or frequent assessment (< / = every 2 - 4 hours) with medical / nursing intervention.  Admit to Progressive based on following criteria: Other see comments  Comments: Prior large stoke with some hemorrhagic conversion and new deficit(s)  May place patient in observation at [facility] or [facility] if equivalent level of care is available:: No  Covid Evaluation: Confirmed COVID Negative  Diagnosis: Acute focal neurological deficit [730320]  Admitting Physician: Synetta Fail [[id number]]  Attending Physician: Synetta Fail [[id number]]          B Medical/Surgery History Past Medical History:  Diagnosis Date   Acute ischemic right PCA stroke (HCC) 08/27/2019   AKI (acute kidney injury) (HCC) 08/29/2019   Anginal pain (HCC)    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 ENDOSCOPY;  Service: Cardiovascular;  Laterality: N/A;     A IV Location/Drains/Wounds Patient Lines/Drains/Airways Status     Active Line/Drains/Airways     Name Placement date Placement time Site Days   Peripheral IV 05/18/23 18 G Posterior;Proximal;Right Forearm 05/18/23  0935  Forearm  less than 1   Peripheral IV 05/18/23 18 G Distal;Left;Posterior Forearm 05/18/23  --  Forearm  less than 1   Wound / Incision (Open or Dehisced) 09/10/19 Other (Comment) Penis small skin tear noted. 09/10/19  2040  Penis  1346            Intake/Output Last 24 hours No intake or output data in the 24 hours ending 05/18/23 1258  Labs/Imaging Results for orders placed or performed during the hospital encounter of 05/18/23 (from the past 48 hour(s))  Protime-INR     Status: None   Collection Time: 05/18/23  9:37 AM  Result Value Ref Range   Prothrombin Time 14.6 11.4 - 15.2 seconds   INR 1.1 0.8 - 1.2    Comment: (NOTE) INR goal varies  based on device and disease states. Performed at [facility], [address] [place], [place] [address]   APTT     Status: None   Collection Time: 05/18/23  9:37 AM  Result Value Ref Range   aPTT 25 24 - 36 seconds    Comment: Performed at [facility], [address] [place], [place] [address]  CBC     Status: None   Collection Time: 05/18/23  9:37 AM  Result Value Ref Range   WBC 10.1 4.0 - 10.5 K/uL   RBC 5.67 4.22 - 5.81 MIL/uL   Hemoglobin 14.9 13.0 - 17.0 g/dL   HCT 86.5 78.4 - 69.6 %    MCV 86.2 80.0 - 100.0 fL   MCH 26.3 26.0 - 34.0 pg   MCHC 30.5 30.0 - 36.0 g/dL   RDW 29.5 28.4 - 13.2 %   Platelets 262 150 - 400 K/uL   nRBC 0.0 0.0 - 0.2 %    Comment: Performed at [facility], [address] [place], [place] [address]  Differential     Status: None   Collection Time: 05/18/23  9:37 AM  Result Value Ref Range   Neutrophils Relative % 76 %   Neutro Abs 7.6 1.7 - 7.7 K/uL   Lymphocytes Relative 15 %   Lymphs Abs 1.6 0.7 - 4.0 K/uL   Monocytes Relative 9 %   Monocytes Absolute 0.9 0.1 - 1.0 K/uL   Eosinophils Relative 0 %   Eosinophils Absolute 0.0 0.0 - 0.5 K/uL   Basophils Relative 0 %   Basophils Absolute 0.0 0.0 - 0.1 K/uL   Immature Granulocytes 0 %   Abs Immature Granulocytes 0.04 0.00 - 0.07 K/uL    Comment: Performed at [facility], [address] [place], [place] [address]  Comprehensive metabolic panel     Status: Abnormal   Collection Time: 05/18/23  9:37 AM  Result Value Ref Range   Sodium 148 (H) 135 - 145 mmol/L   Potassium 4.4 3.5 - 5.1 mmol/L   Chloride 111 98 - 111 mmol/L   CO2 23 22 - 32 mmol/L   Glucose, Bld 136 (H) 70 - 99 mg/dL    Comment: Glucose reference range applies only to samples taken after fasting for at least 8 hours.   BUN 36 (H) 8 - 23 mg/dL   Creatinine, Ser 6.64 0.61 - 1.24 mg/dL   Calcium 8.6 (L) 8.9 - 10.3 mg/dL   Total Protein 7.2 6.5 - 8.1 g/dL   Albumin 2.7 (L) 3.5 - 5.0 g/dL   AST 48 (H) 15 - 41 U/L   ALT 38 0 - 44 U/L   Alkaline Phosphatase 109 38 - 126 U/L   Total Bilirubin 1.0 0.3 - 1.2 mg/dL   GFR, Estimated >40 >34 mL/min    Comment: (NOTE) Calculated using the CKD-EPI Creatinine Equation (2021)    Anion gap 14 5 - 15    Comment: Performed at [facility], [address], [place], [place] [address]  Ethanol     Status: None   Collection Time: 05/18/23  9:37 AM  Result Value Ref Range   Alcohol, Ethyl (B) <10 <10 mg/dL    Comment: (NOTE) Lowest detectable limit for serum alcohol is  10 mg/dL.  For medical purposes only. Performed at [facility], [address] [place], [place] [address]   CBG monitoring, ED     Status: Abnormal  Collection Time: 05/18/23  9:37 AM  Result Value Ref Range   Glucose-Capillary 132 (H) 70 - 99 mg/dL    Comment: Glucose reference range applies only to samples taken after fasting for at least 8 hours.  I-stat chem 8, ED     Status: Abnormal   Collection Time: 05/18/23  9:47 AM  Result Value Ref Range   Sodium 151 (H) 135 - 145 mmol/L   Potassium 4.6 3.5 - 5.1 mmol/L   Chloride 115 (H) 98 - 111 mmol/L   BUN 44 (H) 8 - 23 mg/dL   Creatinine, Ser 4.09 0.61 - 1.24 mg/dL   Glucose, Bld 811 (H) 70 - 99 mg/dL    Comment: Glucose reference range applies only to samples taken after fasting for at least 8 hours.   Calcium, Ion 1.07 (L) 1.15 - 1.40 mmol/L   TCO2 29 22 - 32 mmol/L   Hemoglobin 15.6 13.0 - 17.0 g/dL   HCT 91.4 78.2 - 95.6 %  Resp panel by RT-PCR (RSV, Flu A&B, Covid) Anterior Nasal Swab     Status: None   Collection Time: 05/18/23 10:47 AM   Specimen: Anterior Nasal Swab  Result Value Ref Range   SARS Coronavirus 2 by RT PCR NEGATIVE NEGATIVE   Influenza A by PCR NEGATIVE NEGATIVE   Influenza B by PCR NEGATIVE NEGATIVE    Comment: (NOTE) The Xpert Xpress SARS-CoV-2/FLU/RSV plus assay is intended as an aid in the diagnosis of influenza from Nasopharyngeal swab specimens and should not be used as a sole basis for treatment. Nasal washings and aspirates are unacceptable for Xpert Xpress SARS-CoV-2/FLU/RSV testing.  Fact Sheet for Patients: BloggerCourse.com  Fact Sheet for Healthcare Providers: SeriousBroker.it  This test is not yet approved or cleared by the [place] FDA and has been authorized for detection and/or diagnosis of SARS-CoV-2 by FDA under an Emergency Use Authorization (EUA). This EUA will remain in effect (meaning this test can be used) for  the duration of the COVID-19 declaration under Section 564(b)(1) of the Act, 21 U.S.C. section 360bbb-3(b)(1), unless the authorization is terminated or revoked.     Resp Syncytial Virus by PCR NEGATIVE NEGATIVE    Comment: (NOTE) Fact Sheet for Patients: BloggerCourse.com  Fact Sheet for Healthcare Providers: SeriousBroker.it  This test is not yet approved or cleared by the [place] FDA and has been authorized for detection and/or diagnosis of SARS-CoV-2 by FDA under an Emergency Use Authorization (EUA). This EUA will remain in effect (meaning this test can be used) for the duration of the COVID-19 declaration under Section 564(b)(1) of the Act, 21 U.S.C. section 360bbb-3(b)(1), unless the authorization is terminated or revoked.  Performed at [facility], [address] [place], [place] [address]    DG Chest Portable 1 View  Result Date: 05/18/2023 CLINICAL DATA:  Fever. EXAM: PORTABLE CHEST 1 VIEW COMPARISON:  04/06/2023 FINDINGS: The lungs are clear without focal pneumonia, edema, pneumothorax or pleural effusion. The cardiopericardial silhouette is within normal limits for size. No acute bony abnormality. Telemetry leads overlie the chest. IMPRESSION: No active disease. Electronically Signed   By: Kennith Center M.D.   On: 05/18/2023 11:11   CT HEAD CODE STROKE WO CONTRAST  Result Date: 05/18/2023 CLINICAL DATA:  Code stroke. 67 year old male status post code stroke presentation last month with large right MCA territory infarct at that time. EXAM: CT HEAD WITHOUT CONTRAST TECHNIQUE: Contiguous axial images were obtained from the base of the skull through the vertex without  intravenous contrast. RADIATION DOSE REDUCTION: This exam was performed according to the departmental dose-optimization program which includes automated exposure control, adjustment of the mA and/or kV according to patient size and/or use of iterative  reconstruction technique. COMPARISON:  Brain MRI 04/03/2023 and earlier. FINDINGS: Brain: Evolving large right MCA territory infarct with petechial hemorrhagic transformation in multiple areas. But there is decreased mass effect on the right lateral ventricle and no midline shift. No intraventricular hemorrhage. No basilar cistern hemorrhage. Underlying chronic severe encephalomalacia in the other major vascular territories of the bilateral cerebral and cerebellar hemispheres, stable. Vascular: Calcified atherosclerosis at the skull base. No suspicious intracranial vascular hyperdensity. Skull: No acute osseous abnormality identified. Sinuses/Orbits: Visualized paranasal sinuses and mastoids are stable and well aerated. Other: Slight leftward gaze now. Visualized scalp soft tissues are within normal limits. ASPECTS [facility] Stroke Program Early CT Score) Total score (0-10 with 10 being normal): 10 when allowing for previous infarcts, petechial intracranial hemorrhage is present. IMPRESSION: 1. Multifocal petechial hemorrhagic transformation of the large Right MCA infarct last month. No significant intracranial mass effect. No malignant hemorrhage or convincing extra-axial blood. 2. Other bilateral cerebral and cerebellar severe chronic ischemic disease appears stable. 3. These results were communicated to Dr. Derry Lory at 9:53 am on 05/18/2023 by text page via the [facility] messaging system. Electronically Signed   By: Odessa Fleming M.D.   On: 05/18/2023 09:54    Pending Labs Unresulted Labs (From admission, onward)     Start     Ordered   05/19/23 0500  Comprehensive metabolic panel  Tomorrow morning,   R        05/18/23 1225   05/19/23 0500  CBC  Tomorrow morning,   R        05/18/23 1225   05/18/23 1221  Respiratory (~20 pathogens) panel by PCR  (Respiratory panel by PCR (~20 pathogens, ~24 hr TAT)  w precautions)  Once,   R        05/18/23 1225   05/18/23 1044  Urinalysis, Routine w reflex microscopic -Urine,  Clean Catch  Once,   URGENT       Question:  Specimen Source  Answer:  Urine, Clean Catch   05/18/23 1043            Vitals/Pain Today's Vitals   05/18/23 0900 05/18/23 0940 05/18/23 1011  BP:  (!) 144/98   Pulse:  100   Resp:  16   Temp:  97.8 F (36.6 C) (!) 100.8 F (38.2 C)  TempSrc:  Oral Rectal  SpO2:  98%   Weight: 65 kg      Isolation Precautions Droplet precaution  Medications Medications  atorvastatin (LIPITOR) tablet 40 mg (has no administration in time range)  PARoxetine (PAXIL) tablet 10 mg (has no administration in time range)  divalproex (DEPAKOTE SPRINKLE) capsule 125 mg (has no administration in time range)  sodium chloride flush (NS) 0.9 % injection 3 mL (has no administration in time range)  acetaminophen (TYLENOL) tablet 650 mg (has no administration in time range)    Or  acetaminophen (TYLENOL) suppository 650 mg (has no administration in time range)  polyethylene glycol (MIRALAX / GLYCOLAX) packet 17 g (has no administration in time range)  sodium chloride flush (NS) 0.9 % injection 3 mL (3 mLs Intravenous Given 05/18/23 1008)    Mobility non-ambulatory     Focused Assessments Neuro Assessment Handoff:  Swallow screen pass? No    NIH Stroke Scale  Dizziness Present: No Headache Present: No  Interval: Neuro change Level of Consciousness (1a.)   : Not alert, but arousable by minor stimulation to obey, answer, or respond LOC Questions (1b. )   : Answers neither question correctly LOC Commands (1c. )   : Performs both tasks correctly Best Gaze (2. )  : Partial gaze palsy Visual (3. )  : Complete hemianopia Facial Palsy (4. )    : Minor paralysis Motor Arm, Left (5a. )   : No movement Motor Arm, Right (5b. ) : Drift Motor Leg, Left (6a. )  : No movement Motor Leg, Right (6b. ) : No effort against gravity Limb Ataxia (7. ): Absent Sensory (8. )  : Severe to total sensory loss, patient is not aware of being touched in the face, arm, and  leg Best Language (9. )  : Mute, global aphasia Dysarthria (10. ): Severe dysarthria, patient's speech is so slurred as to be unintelligible in the absence of or out of proportion to any dysphasia, or is mute/anarthric Extinction/Inattention (11.)   : No Abnormality Complete NIHSS TOTAL: 26 Last date known well: 05/17/23 Last time known well: 1800 Neuro Assessment: Exceptions to WDL Neuro Checks:   Initial (05/18/23 0935)  Has TPA been given? No If patient is a Neuro Trauma and patient is going to OR before floor call report to 4N Charge nurse: [phone] or [phone]   R Recommendations: See Admitting Provider Note  Report given to:   Additional Notes:

## 2023-05-18 NOTE — Procedures (Signed)
Routine EEG Report  [name] is a 67 y.o. male with a history of altered mental status and right hemispheric stroke who is undergoing an EEG to evaluate for seizures.  Report: This EEG was acquired with electrodes placed according to the International 10-20 electrode system (including Fp1, Fp2, F3, F4, C3, C4, P3, P4, O1, O2, T3, T4, T5, T6, A1, A2, Fz, Cz, Pz). The following electrodes were missing or displaced: none.  The occipital dominant rhythm was 6-7 Hz with superimposed focal slowing on the right. This activity is reactive to stimulation. Drowsiness was manifested by background fragmentation; deeper stages of sleep were identified by K complexes and sleep spindles. There were no interictal epileptiform discharges. There were no electrographic seizures identified. Photic stimulation and hyperventilation were not performed.  Impression and clinical correlation: This EEG was obtained while awake and asleep and is abnormal due to mild diffuse slowing indicative of global cerebral dysfunction and superimposed focal slowing over the region of his stroke on the right. Epileptiform abnormalities were not seen during this recording.  Bing Neighbors, MD [facility] [phone]  If 7pm- 7am, please page neurology on call as listed in AMION.

## 2023-05-18 NOTE — Progress Notes (Signed)
EEG complete - results pending 

## 2023-05-18 NOTE — Code Documentation (Signed)
Stroke Response Nurse Documentation Code Documentation  [name] is a 67 y.o. male arriving to [facility]  via [facility] on 05-18-2023 with past medical hx of CVA, vascular dementia, HTN. On aspirin 81 mg daily and clopidogrel 75 mg daily. Code stroke was activated by EMS.   Patient from nursing facility where he was LKW at 1800 05/17/2023 and now complaining of decreased mental status and aphasia.  Patient in Rehab at facility status post CVA 1 month ago.    Stroke team at the bedside on patient arrival. Labs drawn and patient cleared for CT by Dr. tegeler. Patient to CT with team. NIHSS 26, see documentation for details and code stroke times. Patient with decreased LOC, disoriented, right gaze preference , left hemianopia, left facial droop, left arm weakness, bilateral leg weakness, left decreased sensation, Global aphasia , and dysarthria  on exam. The following imaging was completed:  CT Head and CTA. Patient is not a candidate for IV Thrombolytic due to last known well and recent CVA. Patient is not a candidate for IR due to no LVO on CTA.   Care Plan: VS and NIHSS q 2hours x 12.   Bedside handoff with ED RN Carollee Herter.    Marcellina Millin  Stroke Response RN

## 2023-05-18 NOTE — Consult Note (Signed)
Neurology Consultation  Reason for Consult: CODE STROKE Referring Physician: Dr. Rush Landmark  CC: altered mental status  History is obtained from: chart review and EMS  HPI: Geramy Fickle is a 67 y.o. 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 On exam at bridge, patient is globally aphasic, follows simple commands on his right side, left hemiparesis (chronic per chart), right gaze preference (chronic per chart). NIH 12. CT shows petechial hemorrhage transformation of previous large right MCA infarct.    LKW: 1800 8/31 but this is somewhat unclear TNK given?: no, recent stroke IR Thrombectomy? No, no LVO suspected Modified Rankin Scale: 4-Needs assistance to walk and tend to bodily needs/ 5-Severe disability-bedridden, incontinent, needs constant attention  ROS: Unable to obtain due to altered mental status.   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 His smoking use included cigarettes. He started smoking about 47 years ago. He has a 9.3 pack-year smoking history. He has never used smokeless tobacco. He reports current alcohol use of about 17.0 standard drinks of alcohol per week. He reports current drug use. Drug: Cocaine.  Medications  Current Facility-Administered Medications:    sodium chloride flush (NS) 0.9 %  injection 3 mL, 3 mL, Intravenous, Once, Tegeler, Canary Brim, MD  Current Outpatient Medications:    Amino Acids-Protein Hydrolys (PRO-STAT AWC) LIQD, Take 30 mLs by mouth 2 (two) times daily., Disp: , Rfl:    aspirin EC 81 MG tablet, Take 1 tablet (81 mg total) by mouth daily. Swallow whole., Disp: , Rfl:    atorvastatin (LIPITOR) 40 MG tablet, Take 40 mg by mouth at bedtime., Disp: , Rfl:    clopidogrel (PLAVIX) 75 MG tablet, Take 1 tablet (75 mg total) by mouth daily., Disp: , Rfl:    divalproex (DEPAKOTE SPRINKLE) 125 MG capsule, Take 125 mg by mouth 2 (two) times daily., Disp: , Rfl:    metoprolol tartrate (LOPRESSOR) 25 MG tablet, Take 1 tablet (25 mg total) by mouth 2 (two) times daily., Disp: 30 tablet, Rfl: 0   PARoxetine (PAXIL) 10 MG tablet, Take 1 tablet (10 mg total) by mouth at bedtime., Disp: 30 tablet, Rfl: 0   senna-docusate (SENOKOT-S) 8.6-50 MG tablet, Take 1 tablet by mouth 2 (two) times daily., Disp: 30 tablet, Rfl: 0   Vitamin D, Ergocalciferol, (DRISDOL) 1.25 MG (50000 UNIT) CAPS capsule, Take 50,000 Units by mouth every 7 (seven) days. Given on Mondays, Disp: , Rfl:   Exam: Current vital signs: There were no vitals taken for this visit. Vital signs in last 24 hours: BP: ()/()  Arterial Line BP: ()/()   GENERAL: Awake, alert, in no acute distress Psych: Affect appropriate for situation, patient is calm and cooperative with examination Head: Normocephalic and atraumatic, without obvious abnormality EENT:  Normal conjunctivae, dry mucous membranes, no OP obstruction LUNGS: Normal respiratory effort. Non-labored breathing on room air CV: Regular rate and rhythm on telemetry ABDOMEN: Soft, non-tender, non-distended Extremities: warm, well perfused, without obvious deformity  NEURO:  Mental Status: Awake, responsive to voice, global aphasia.  Followed simple commands x2.   Cranial Nerves:  II: PERRL, UTA VFF III, IV, VI: Right gaze preference but does cross  midline V: Sensation is intact to light touch and symmetrical to face. Blinks to threat. VII: Face is symmetric resting and smiling.  VIII: Hearing intact to voice IX, X: Palate elevation is symmetric. XI: UTA shoulder shrug XII: Tongue protrudes midline without fasciculations.   Motor: 5/5 strength is all muscle groups.  Tone is normal. Bulk is normal.  Sensation: Intact to light touch bilaterally in all four extremities. No extinction to DSS present.  Coordination: FTN intact bilaterally. HKS intact bilaterally. No pronator drift. Alternating hand movements.  Gait: Deferred  NIHSS: 1a Level of Conscious.: 0 1b LOC Questions: 0 1c LOC Commands: 1 2 Best Gaze: 1 3 Visual: 0 4 Facial Palsy: 0 5a Motor Arm - left: 3 5b Motor Arm - Right: 0 6a Motor Leg - Left: 4 6b Motor Leg - Right: 0 7 Limb Ataxia: 0 8 Sensory: 0 9 Best Language: 3 10 Dysarthria: 0 11 Extinct. and Inatten.: 0 TOTAL: 12  Labs I have reviewed labs in epic and the results pertinent to this consultation are: Na: 148 AST: 48   CBC    Component Value Date/Time   WBC 10.8 (H) 04/06/2023 0957   RBC 4.87 04/06/2023 0957   HGB 13.3 04/06/2023 0957   HCT 40.8 04/06/2023 0957   PLT 233 04/06/2023 0957   MCV 83.8 04/06/2023 0957   MCH 27.3 04/06/2023 0957   MCHC 32.6 04/06/2023 0957   RDW 14.1 04/06/2023 0957   LYMPHSABS 0.8 04/06/2023 0957   MONOABS 0.9 04/06/2023 0957   EOSABS 0.0 04/06/2023 0957   BASOSABS 0.0 04/06/2023 0957    CMP     Component Value Date/Time   NA 141 04/03/2023 1441   K 3.7 04/03/2023 1441   CL 105 04/03/2023 1441   CO2 19 (L) 04/03/2023 1435   GLUCOSE 117 (H) 04/03/2023 1441   BUN 13 04/03/2023 1441   CREATININE 1.20 04/03/2023 1441   CALCIUM 8.4 (L) 04/03/2023 1435   PROT 6.8 04/03/2023 1435   ALBUMIN 3.4 (L) 04/03/2023 1435   AST 38 04/03/2023 1435   ALT 38 04/03/2023 1435   ALKPHOS 124 04/03/2023 1435   BILITOT 0.6 04/03/2023 1435   GFRNONAA >60 04/03/2023 1435    GFRAA >60 09/16/2019 0327    Lipid Panel     Component Value Date/Time   CHOL 122 04/04/2023 1240   TRIG 56 04/04/2023 1240   HDL 59 04/04/2023 1240   CHOLHDL 2.1 04/04/2023 1240   VLDL 11 04/04/2023 1240   LDLCALC 52 04/04/2023 1240     Imaging I have reviewed the images obtained:  CT-scan of the brain Multifocal petechial hemorrhage transformation of large Right MCA infarct last month.  No significant mass effect Other bilateral cerebral and cerebellar severe chronic ischemic disease stable.   MRI examination of the brain: pending  Assessment: Torris Matsuoka is a 67 y.o. 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  Impression: MRI will help differentiating  between petechial hemorrhage areas and possible cortical laminar necrosis. It is also possible that an infectious process such as an UTI or electrolyte derangement/toxic-metabolic issue may have caused recrudescence of his old stroke symptoms.   Recommendations: - MRI routine - repeat CT head tomorrow AM  - EEG - hold ASA/Plavix/DVT prophylaxis until MRI stable - infectious workup per primary - metabolic workup per primary   Pt seen by Neuro NP/APP and later by MD. Note/plan to be edited by MD as needed.    Lynnae January, DNP, AGACNP-BC [facility] Please use AMION for contact information & EPIC for messaging.   NEUROHOSPITALIST ADDENDUM Performed a face to face diagnostic evaluation.   I have reviewed the contents of history and physical exam as documented by PA/ARNP/Resident and agree with above documentation.  I have discussed and formulated the above plan as documented. Edits to the note have been made as needed.  Impression/Key exam findings/Plan: Had R MCA stroke last month and presents with less interactive at his facility. Has petechial but  notable R MCA hemorrhage without any frank hematoma. Hold antiplatelet, will get repeat CT Head and get MRI brain. Hold DVT prophylaxis. Blood pressure is steady and stable and spontaneously less than 150 thus will admit to floor.  Stroke team to follow tomorrow.  Erick Blinks, MD [facility] [phone]   If 7pm to 7am, please call on call as listed on AMION.

## 2023-05-19 DIAGNOSIS — Z79899 Other long term (current) drug therapy: Secondary | ICD-10-CM | POA: Diagnosis not present

## 2023-05-19 DIAGNOSIS — F0150 Vascular dementia without behavioral disturbance: Secondary | ICD-10-CM | POA: Diagnosis present

## 2023-05-19 DIAGNOSIS — I6389 Other cerebral infarction: Secondary | ICD-10-CM | POA: Diagnosis not present

## 2023-05-19 DIAGNOSIS — Z7401 Bed confinement status: Secondary | ICD-10-CM | POA: Diagnosis not present

## 2023-05-19 DIAGNOSIS — I618 Other nontraumatic intracerebral hemorrhage: Secondary | ICD-10-CM | POA: Diagnosis present

## 2023-05-19 DIAGNOSIS — I452 Bifascicular block: Secondary | ICD-10-CM | POA: Diagnosis present

## 2023-05-19 DIAGNOSIS — R29818 Other symptoms and signs involving the nervous system: Secondary | ICD-10-CM

## 2023-05-19 DIAGNOSIS — I119 Hypertensive heart disease without heart failure: Secondary | ICD-10-CM | POA: Diagnosis present

## 2023-05-19 DIAGNOSIS — I69391 Dysphagia following cerebral infarction: Secondary | ICD-10-CM | POA: Diagnosis not present

## 2023-05-19 DIAGNOSIS — Z7902 Long term (current) use of antithrombotics/antiplatelets: Secondary | ICD-10-CM | POA: Diagnosis not present

## 2023-05-19 DIAGNOSIS — E870 Hyperosmolality and hypernatremia: Secondary | ICD-10-CM | POA: Diagnosis present

## 2023-05-19 DIAGNOSIS — L409 Psoriasis, unspecified: Secondary | ICD-10-CM | POA: Diagnosis present

## 2023-05-19 DIAGNOSIS — Z515 Encounter for palliative care: Secondary | ICD-10-CM

## 2023-05-19 DIAGNOSIS — I499 Cardiac arrhythmia, unspecified: Secondary | ICD-10-CM | POA: Diagnosis not present

## 2023-05-19 DIAGNOSIS — R29726 NIHSS score 26: Secondary | ICD-10-CM | POA: Diagnosis present

## 2023-05-19 DIAGNOSIS — I69354 Hemiplegia and hemiparesis following cerebral infarction affecting left non-dominant side: Secondary | ICD-10-CM | POA: Diagnosis not present

## 2023-05-19 DIAGNOSIS — Z833 Family history of diabetes mellitus: Secondary | ICD-10-CM | POA: Diagnosis not present

## 2023-05-19 DIAGNOSIS — E860 Dehydration: Secondary | ICD-10-CM | POA: Diagnosis present

## 2023-05-19 DIAGNOSIS — Z66 Do not resuscitate: Secondary | ICD-10-CM | POA: Diagnosis not present

## 2023-05-19 DIAGNOSIS — Z1152 Encounter for screening for COVID-19: Secondary | ICD-10-CM | POA: Diagnosis not present

## 2023-05-19 DIAGNOSIS — I69320 Aphasia following cerebral infarction: Secondary | ICD-10-CM | POA: Diagnosis not present

## 2023-05-19 DIAGNOSIS — E785 Hyperlipidemia, unspecified: Secondary | ICD-10-CM | POA: Diagnosis present

## 2023-05-19 DIAGNOSIS — R4701 Aphasia: Secondary | ICD-10-CM | POA: Diagnosis not present

## 2023-05-19 DIAGNOSIS — L89620 Pressure ulcer of left heel, unstageable: Secondary | ICD-10-CM | POA: Diagnosis present

## 2023-05-19 DIAGNOSIS — R404 Transient alteration of awareness: Secondary | ICD-10-CM | POA: Diagnosis not present

## 2023-05-19 DIAGNOSIS — R1310 Dysphagia, unspecified: Secondary | ICD-10-CM | POA: Diagnosis not present

## 2023-05-19 DIAGNOSIS — E43 Unspecified severe protein-calorie malnutrition: Secondary | ICD-10-CM | POA: Diagnosis present

## 2023-05-19 DIAGNOSIS — Z87891 Personal history of nicotine dependence: Secondary | ICD-10-CM | POA: Diagnosis not present

## 2023-05-19 DIAGNOSIS — L89896 Pressure-induced deep tissue damage of other site: Secondary | ICD-10-CM | POA: Diagnosis present

## 2023-05-19 DIAGNOSIS — Z7982 Long term (current) use of aspirin: Secondary | ICD-10-CM | POA: Diagnosis not present

## 2023-05-19 LAB — URINALYSIS, ROUTINE W REFLEX MICROSCOPIC
Bilirubin Urine: NEGATIVE
Glucose, UA: NEGATIVE mg/dL
Hgb urine dipstick: NEGATIVE
Ketones, ur: NEGATIVE mg/dL
Leukocytes,Ua: NEGATIVE
Nitrite: NEGATIVE
Protein, ur: NEGATIVE mg/dL
Specific Gravity, Urine: 1.033 — ABNORMAL HIGH (ref 1.005–1.030)
pH: 5 (ref 5.0–8.0)

## 2023-05-19 LAB — COMPREHENSIVE METABOLIC PANEL
ALT: 33 U/L (ref 0–44)
AST: 33 U/L (ref 15–41)
Albumin: 2.5 g/dL — ABNORMAL LOW (ref 3.5–5.0)
Alkaline Phosphatase: 105 U/L (ref 38–126)
Anion gap: 8 (ref 5–15)
BUN: 37 mg/dL — ABNORMAL HIGH (ref 8–23)
CO2: 29 mmol/L (ref 22–32)
Calcium: 8.9 mg/dL (ref 8.9–10.3)
Chloride: 112 mmol/L — ABNORMAL HIGH (ref 98–111)
Creatinine, Ser: 0.95 mg/dL (ref 0.61–1.24)
GFR, Estimated: >60 mL/min (ref >60)
Glucose, Bld: 123 mg/dL — ABNORMAL HIGH (ref 70–99)
Potassium: 3.8 mmol/L (ref 3.5–5.1)
Sodium: 149 mmol/L — ABNORMAL HIGH (ref 135–145)
Total Bilirubin: 1 mg/dL (ref 0.3–1.2)
Total Protein: 6.8 g/dL (ref 6.5–8.1)

## 2023-05-19 LAB — OSMOLALITY, URINE: Osmolality, Ur: 1139 mosm/kg — ABNORMAL HIGH (ref 300–900)

## 2023-05-19 LAB — CBC
HCT: 47.7 % (ref 39.0–52.0)
Hemoglobin: 14.4 g/dL (ref 13.0–17.0)
MCH: 26.5 pg (ref 26.0–34.0)
MCHC: 30.2 g/dL (ref 30.0–36.0)
MCV: 87.8 fL (ref 80.0–100.0)
Platelets: 234 10*3/uL (ref 150–400)
RBC: 5.43 MIL/uL (ref 4.22–5.81)
RDW: 14.3 % (ref 11.5–15.5)
WBC: 9.8 10*3/uL (ref 4.0–10.5)
nRBC: 0 % (ref 0.0–0.2)

## 2023-05-19 LAB — URIC ACID: Uric Acid, Serum: 6.1 mg/dL (ref 3.7–8.6)

## 2023-05-19 LAB — SODIUM, URINE, RANDOM: Sodium, Ur: 13 mmol/L

## 2023-05-19 LAB — CREATININE, URINE, RANDOM: Creatinine, Urine: 237 mg/dL

## 2023-05-19 LAB — OSMOLALITY: Osmolality: 339 mosm/kg (ref 275–295)

## 2023-05-19 MED ORDER — MORPHINE SULFATE (CONCENTRATE) 10 MG/0.5ML PO SOLN: 5.0000 mg | ORAL | Status: DC | PRN

## 2023-05-19 MED ORDER — DEXTROSE 5 % IV SOLN: INTRAVENOUS | Status: DC

## 2023-05-19 MED ORDER — HEPARIN SODIUM (PORCINE) 5000 UNIT/ML IJ SOLN: 5000.0000 [IU] | Freq: Two times a day (BID) | INTRAMUSCULAR | Status: DC

## 2023-05-19 NOTE — Evaluation (Signed)
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   Heart murmur    Hypertension    Psoriasis    "back and stomach" (02/03/2013)   Stroke (HCC) 02/01/2013   left frontal CVA and symptoms of right arm and leg weakness and expressive aphasia/notes 02/03/2013 (02/03/2013)   Past Surgical History:  Past Surgical History:  Procedure Laterality Date   NO PAST SURGERIES     TEE WITHOUT CARDIOVERSION N/A 02/09/2013   Procedure: TRANSESOPHAGEAL ECHOCARDIOGRAM (TEE);  Surgeon: Lewayne Bunting, MD;  Location: [facility] ENDOSCOPY;  Service: Cardiovascular;  Laterality: N/A;   HPI:  [name] is a 67 yo male presenting to ED 9/1 from SNF with worsening mental status and new onset aphasia. Admitted July 2024 with significant R MCA infarct and was discharged with L sided deficits. MRI Brain with continued interval evolution of previously identified large R MCA distribution infarct. EEG indicative of global cerebral dysfunction. Most recently seen by SLP 04/07/23 with increased L sided weakness and signs of oral dysphagia. He was recommended a Dys 1 diet with thin liquids at that time. Pt has a history of cognitive difficulties evidenced by SLP sessions in December 2020 and July 2024, including deficits related to awareness, impulsivity, problem solving, and speech intelligibility. Pt scored WFL on all tasks related to repetition, attention, naming, and problem solving during administration of portions fo the Cognistat July 2024, although presented with dysarthria. PMH includes  posterior dimension, prior CVA, HTN, HLD    Assessment / Plan / Recommendation  Clinical Impression  Pt able to follow commands to complete an oral motor exam, which is significant for L sided weakness and asymmetry as well as missing dentition in poor condition. Unable to elicit volitional swallow or cough. Observed pt with trials of thin liquids and purees with no clinical signs concerning for aspiration. Pt appeared to demonstrate oral holding of all textures, although promptly responded to cueing to initiate a swallow. Pt has oral residue noted with purees and appears to be missing increased dentition since prior session (July 2024), which is concerning for pt's ability to thoroughly masticate solids. At this time, pt unable to answer questions related to his baseline diet, although feel he may benefit from a modified diet at this time due to mentation. Recommend a diet of Dys 1 textures with thin liquids and meds crushed in puree. He will likely require full supervision from nursing staff to assist with feeding and provide cueing. Will continue to follow. SLP Visit Diagnosis: Dysphagia, unspecified (R13.10)    Aspiration Risk  Mild aspiration risk    Diet Recommendation Dysphagia 1 (Puree);Thin liquid    Liquid Administration via: Cup;Straw Medication Administration: Crushed with puree Supervision: Staff to assist with self feeding;Full supervision/cueing for compensatory strategies Compensations: Minimize environmental distractions;Slow rate;Small sips/bites;Lingual sweep for clearance of pocketing Postural Changes: Seated upright at 90 degrees    Other  Recommendations Oral Care Recommendations: Oral care BID;Staff/trained caregiver to provide oral care    Recommendations for follow up therapy are one component of a multi-disciplinary discharge planning process, led by the  attending physician.  Recommendations may be updated based on patient status, additional functional criteria and  insurance authorization.  Follow up Recommendations Skilled nursing-short term rehab (<3 hours/day)      Assistance Recommended at Discharge Frequent or constant Supervision/Assistance  Functional Status Assessment Patient has had a recent decline in their functional status and demonstrates the ability to make significant improvements in function in a reasonable and predictable amount of time.  Frequency and Duration min 2x/week  2 weeks       Prognosis Prognosis for improved oropharyngeal function: Good Barriers to Reach Goals: Cognitive deficits;Time post onset      Swallow Study   General HPI: [name] is a 68 yo male presenting to ED 9/1 from SNF with worsening mental status and new onset aphasia. Admitted July 2024 with significant R MCA infarct and was discharged with L sided deficits. MRI Brain with continued interval evolution of previously identified large R MCA distribution infarct. EEG indicative of global cerebral dysfunction. Most recently seen by SLP 04/07/23 with increased L sided weakness and signs of oral dysphagia. He was recommended a Dys 1 diet with thin liquids at that time. Pt has a history of cognitive difficulties evidenced by SLP sessions in December 2020 and July 2024, including deficits related to awareness, impulsivity, problem solving, and speech intelligibility. Pt scored WFL on all tasks related to repetition, attention, naming, and problem solving during administration of portions fo the Cognistat July 2024, although presented with dysarthria. PMH includes posterior dimension, prior CVA, HTN, HLD Type of Study: Bedside Swallow Evaluation Previous Swallow Assessment: see HPI Diet Prior to this Study: NPO Temperature Spikes Noted: No Respiratory Status: Room air History of Recent Intubation: No Behavior/Cognition: Alert;Cooperative;Requires cueing Oral Cavity Assessment: Within Functional Limits Oral Care Completed by SLP: No Oral Cavity - Dentition:  Poor condition;Missing dentition Vision: Functional for self-feeding Self-Feeding Abilities: Total assist Patient Positioning: Upright in bed Baseline Vocal Quality: Not observed Volitional Cough: Cognitively unable to elicit Volitional Swallow: Unable to elicit    Oral/Motor/Sensory Function Overall Oral Motor/Sensory Function: Moderate impairment Facial ROM: Reduced left;Suspected CN VII (facial) dysfunction Facial Symmetry: Abnormal symmetry left;Suspected CN VII (facial) dysfunction Facial Strength: Reduced left;Suspected CN VII (facial) dysfunction Facial Sensation: Reduced left;Suspected CN V (Trigeminal) dysfunction Lingual ROM: Reduced left;Suspected CN XII (hypoglossal) dysfunction Lingual Symmetry: Abnormal symmetry left;Suspected CN XII (hypoglossal) dysfunction Lingual Strength: Suspected CN XII (hypoglossal) dysfunction;Reduced Lingual Sensation: Reduced;Suspected CN VII (facial) dysfunction-anterior 2/3 tongue   Ice Chips Ice chips: Not tested   Thin Liquid Thin Liquid: Within functional limits Presentation: Cup    Nectar Thick Nectar Thick Liquid: Not tested   Honey Thick Honey Thick Liquid: Not tested   Puree Puree: Within functional limits Presentation: Spoon   Solid     Solid: Not tested      Gwynneth Aliment, M.A., CF-SLP Speech Language Pathology, Acute Rehabilitation Services  Secure Chat preferred [phone]  05/19/2023,12:32 PM

## 2023-05-19 NOTE — Progress Notes (Signed)
PROGRESS NOTE                                                                                                                                                                                                             Patient Demographics:    [name], is a 67 y.o. male, DOB - Nov 02, 1955, WUJ:[id number]  Outpatient Primary MD for the patient is Clovis [name], L.August Saucer, MD    LOS - 0  Admit date - 05/18/2023    Chief Complaint  Patient presents with   Code Stroke       Brief Narrative (HPI from H&P)   67 y.o. male with medical history significant of posterior dimension, stroke, hypertension, hyperlipidemia presenting after worsening mental status at rehab facility.   History obtained with assistance of chart review.  Patient was admitted in July of this year and found to have significant right MCA infarct.  Discharged with left-sided deficits including left hemiparesis and right-sided gaze preference.  Had a baseline been able to communicate and joke at facility, according to patient's brother patient became increasingly more aphasic and developed dysphagia few weeks ago, started losing more weight, gradually started to decline further and was sent to the hospital for worsening aphasia and dysphagia.  In the hospital imaging of the brain which included MRI suggested evolution of his previous right MCA stroke with some hemorrhagic conversion, he also had evidence of severe dehydration.  He was admitted for further care.   Subjective:    [name] today in bed in no discomfort, but unable to answer questions or follow commands.   Assessment  & Plan :    Acute focal neurologic deficit, History of multiple CVAs in the past with recent right MCA CVA 6 weeks ago, now here with gradually progressive aphasia and dysphagia for the last few weeks at SNF.    > Presenting with gradually progressive aphasia and dysphagia over the last  few weeks according to the brother, severe dehydration and weight loss ongoing for several weeks as well.  MRI of the brain suggestive of evolution of recent right MCA stroke with some hemorrhagic conversion.  Case discussed with stroke MD in detail, for now single antiplatelet agent aspirin along with statin for secondary prevention.  EEG nonacute.  Recently poor quality of life with not eating or drinking well and losing weight,  confirmed with patient's brother and sister.  Continue supportive care for now, initiate goals of care discussion as well.   Hypertension  -ER and hydralazine, oral intake poor   Hyperlipidemia  - Continue atorvastatin when able, SLP to evaluate.   Vascular dementia  - Continue home Paxil and Depakote when able  Decreased oral intake, severe dehydration, severe protein calorie malnutrition.  IV fluids, supportive care, protein supplements when he is able to take oral diet, poor candidate for feeding tube, initiate long-term goals of care discussion with palliative care and family.      Condition - Extremely Guarded  Family Communication  :  brother and sister over the phone on 05/19/23  Code Status :  Full  Consults  :  Neuro  PUD Prophylaxis :    Procedures  :     MRI -  1. Continued interval evolution of previously identified large right MCA distribution infarct. Superimposed multifocal areas of petechial hemorrhage again seen, more pronounced in appearance by MRI as compared to recent CT. No frank intraparenchymal hematoma or malignant hemorrhagic transformation. 2. No other new acute intracranial abnormality. 3. Underlying atrophy with advanced chronic microvascular ischemic disease, with multiple additional chronic bilateral cerebral and cerebellar infarcts as above.      Disposition Plan  :    Status is: Observation  DVT Prophylaxis  :    SCDs Start: 05/18/23 1220    Lab Results  Component Value Date   PLT 234 05/19/2023    Diet :  Diet Order              Diet NPO time specified  Diet effective now                    Inpatient Medications  Scheduled Meds:  atorvastatin  40 mg Oral QHS   divalproex  125 mg Oral BID   PARoxetine  10 mg Oral QHS   Continuous Infusions:  dextrose 75 mL/hr at 05/19/23 0612   PRN Meds:.acetaminophen **OR** acetaminophen, polyethylene glycol  Antibiotics  :    Anti-infectives (From admission, onward)    None         Objective:   Vitals:   05/19/23 0400 05/19/23 0500 05/19/23 0539 05/19/23 0800  BP: (!) 137/94     Pulse: (!) 108     Resp: 16     Temp: 98.4 F (36.9 C)   97.9 F (36.6 C)  TempSrc: Axillary   Oral  SpO2: 94%     Weight:  67.6 kg 65.3 kg     Wt Readings from Last 3 Encounters:  05/19/23 65.3 kg  04/03/23 75 kg  05/16/21 67.1 kg     Intake/Output Summary (Last 24 hours) at 05/19/2023 0934 Last data filed at 05/19/2023 0535 Gross per 24 hour  Intake 3 ml  Output 150 ml  Net -147 ml     Physical Exam  Awake Alert, No new F.N deficits, Normal affect [place].AT,PERRAL Supple Neck, No JVD,   Symmetrical Chest wall movement, Good air movement bilaterally, CTAB RRR,No Gallops,Rubs or new Murmurs,  +ve B.Sounds, Abd Soft, No tenderness,   No Cyanosis, Clubbing or edema        Data Review:    Recent Labs  Lab 05/18/23 0937 05/18/23 0947 05/19/23 0316  WBC 10.1  --  9.8  HGB 14.9 15.6 14.4  HCT 48.9 46.0 47.7  PLT 262  --  234  MCV 86.2  --  87.8  MCH 26.3  --  26.5  MCHC 30.5  --  30.2  RDW 14.4  --  14.3  LYMPHSABS 1.6  --   --   MONOABS 0.9  --   --   EOSABS 0.0  --   --   BASOSABS 0.0  --   --     Recent Labs  Lab 05/18/23 0937 05/18/23 0947 05/19/23 0316  NA 148* 151* 149*  K 4.4 4.6 3.8  CL 111 115* 112*  CO2 23  --  29  ANIONGAP 14  --  8  GLUCOSE 136* 135* 123*  BUN 36* 44* 37*  CREATININE 1.08 1.00 0.95  AST 48*  --  33  ALT 38  --  33  ALKPHOS 109  --  105  BILITOT 1.0  --  1.0  ALBUMIN 2.7*  --  2.5*  INR 1.1   --   --   CALCIUM 8.6*  --  8.9      Recent Labs  Lab 05/18/23 0937 05/19/23 0316  INR 1.1  --   CALCIUM 8.6* 8.9    --------------------------------------------------------------------------------------------------------------- Lab Results  Component Value Date   CHOL 122 04/04/2023   HDL 59 04/04/2023   LDLCALC 52 04/04/2023   TRIG 56 04/04/2023   CHOLHDL 2.1 04/04/2023    Lab Results  Component Value Date   HGBA1C 5.5 04/04/2023    Radiology Reports EEG adult  Result Date: 05/18/2023 Jefferson Fuel, MD     05/18/2023 11:13 PM Routine EEG Report Dezhon Hollenback is a 67 y.o. male with a history of altered mental status and right hemispheric stroke who is undergoing an EEG to evaluate for seizures. Report: This EEG was acquired with electrodes placed according to the International 10-20 electrode system (including Fp1, Fp2, F3, F4, C3, C4, P3, P4, O1, O2, T3, T4, T5, T6, A1, A2, Fz, Cz, Pz). The following electrodes were missing or displaced: none. The occipital dominant rhythm was 6-7 Hz with superimposed focal slowing on the right. This activity is reactive to stimulation. Drowsiness was manifested by background fragmentation; deeper stages of sleep were identified by K complexes and sleep spindles. There were no interictal epileptiform discharges. There were no electrographic seizures identified. Photic stimulation and hyperventilation were not performed. Impression and clinical correlation: This EEG was obtained while awake and asleep and is abnormal due to mild diffuse slowing indicative of global cerebral dysfunction and superimposed focal slowing over the region of his stroke on the right. Epileptiform abnormalities were not seen during this recording. Bing Neighbors, MD [facility] [phone] If 7pm- 7am, please page neurology on call as listed in AMION.   DG Abd Portable 1V  Result Date: 05/18/2023 CLINICAL DATA:  Altered mental status. Radiograph ordered for MRI  clearance. EXAM: PORTABLE ABDOMEN - 1 VIEW COMPARISON:  None Available. FINDINGS: The bowel gas pattern is normal. No radio-opaque calculi or other significant radiographic abnormality are seen. No radiopaque foreign object to prohibit MRI. IMPRESSION: No radiopaque foreign object to prohibit MRI. Electronically Signed   By: Minerva Fester M.D.   On: 05/18/2023 20:19   MR BRAIN WO CONTRAST  Result Date: 05/18/2023 CLINICAL DATA:  Initial evaluation for neuro deficit, stroke suspected. EXAM: MRI HEAD WITHOUT CONTRAST TECHNIQUE: Multiplanar, multiecho pulse sequences of the brain and surrounding structures were obtained without intravenous contrast. COMPARISON:  Comparison made with prior CTs from earlier the same day as well as previous MRI from 04/03/2023. FINDINGS: Brain: Examination mildly degraded by motion artifact. Mildly advanced cerebral atrophy for age.  Patchy and confluent T2/FLAIR hyperintensity involving the supratentorial cerebral white matter, pons, and cerebellum, most consistent with chronic small vessel ischemic disease, fairly advanced in nature. Chronic left ACA distribution infarct involving the left cerebral hemisphere. Additional chronic right PCA infarct involving the right temporal occipital region. Multifocal chronic bilateral cerebellar infarcts. There has been continued interval evolution of previously identified large right MCA distribution infarct. Developing cystic encephalomalacia and laminar necrosis seen throughout the area of infarction. Superimposed multifocal areas of petechial hemorrhage again seen, more pronounced in appearance by MRI as compared to prior CT. No frank intraparenchymal hematoma or malignant hemorrhagic transformation. No other new areas of acute or interval infarction. No other acute intracranial hemorrhage. Few scattered chronic micro hemorrhages noted, small vessel/hypertensive in nature. No mass lesion or midline shift. Ventricular prominence related to  underlying atrophy and chronic ischemic changes without hydrocephalus. No extra-axial fluid collection. Pituitary gland suprasellar region within normal limits. Vascular: Major intracranial vascular flow voids are maintained, although the right ICA flow void is mildly heterogeneous, likely related to the large right MCA territory infarct. Skull and upper cervical spine: Cranial junction with normal limits. Bone marrow signal intensity normal. No scalp soft tissue abnormality. Sinuses/Orbits: Globes orbital soft tissues within normal limits. Paranasal sinuses are largely clear. No significant mastoid effusion. Other: None. IMPRESSION: 1. Continued interval evolution of previously identified large right MCA distribution infarct. Superimposed multifocal areas of petechial hemorrhage again seen, more pronounced in appearance by MRI as compared to recent CT. No frank intraparenchymal hematoma or malignant hemorrhagic transformation. 2. No other new acute intracranial abnormality. 3. Underlying atrophy with advanced chronic microvascular ischemic disease, with multiple additional chronic bilateral cerebral and cerebellar infarcts as above. Electronically Signed   By: Rise Mu M.D.   On: 05/18/2023 19:43   CT HEAD WO CONTRAST ( )  Result Date: 05/18/2023 CLINICAL DATA:  Follow-up examination for stroke. EXAM: CT HEAD WITHOUT CONTRAST TECHNIQUE: Contiguous axial images were obtained from the base of the skull through the vertex without intravenous contrast. RADIATION DOSE REDUCTION: This exam was performed according to the departmental dose-optimization program which includes automated exposure control, adjustment of the mA and/or kV according to patient size and/or use of iterative reconstruction technique. COMPARISON:  CT from earlier the same day. FINDINGS: Brain: Examination severely degraded by motion artifact, limiting assessment. Large evolving subacute right MCA territory infarct again seen.  Superimposed multifocal areas of petechial hemorrhagic transformation again seen, grossly stable from prior CT. No progressive mass effect or midline shift. Multiple additional chronic infarcts involving the bilateral cerebral hemispheres and cerebellum again noted. Underlying chronic microvascular ischemic disease. No visible new hemorrhage or large vessel territory infarct. No mass lesion or midline shift. Stable ventricular size and morphology without hydrocephalus. No visible extra-axial fluid collection. Vascular: No visible hyperdense vessel on this motion degraded exam. Skull: Scalp soft tissues and calvarium demonstrate no new finding. Sinuses/Orbits: Globes and orbital soft tissues grossly within normal limits, although evaluation limited by motion. Paranasal sinuses are largely clear. No significant mastoid effusion. Other: None. IMPRESSION: 1. Severely motion degraded exam. 2. Large evolving subacute right MCA territory infarct with superimposed multifocal areas of petechial hemorrhage, grossly stable from prior. No progressive mass effect or midline shift. 3. No other new acute intracranial abnormality. 4. Underlying multifocal chronic bilateral cerebral and cerebellar infarcts with severe chronic microvascular ischemic disease. Electronically Signed   By: Rise Mu M.D.   On: 05/18/2023 19:31   DG Chest Portable 1 View  Result Date: 05/18/2023 CLINICAL  DATA:  Fever. EXAM: PORTABLE CHEST 1 VIEW COMPARISON:  04/06/2023 FINDINGS: The lungs are clear without focal pneumonia, edema, pneumothorax or pleural effusion. The cardiopericardial silhouette is within normal limits for size. No acute bony abnormality. Telemetry leads overlie the chest. IMPRESSION: No active disease. Electronically Signed   By: Kennith Center M.D.   On: 05/18/2023 11:11   CT HEAD CODE STROKE WO CONTRAST  Result Date: 05/18/2023 CLINICAL DATA:  Code stroke. 67 year old male status post code stroke presentation last month  with large right MCA territory infarct at that time. EXAM: CT HEAD WITHOUT CONTRAST TECHNIQUE: Contiguous axial images were obtained from the base of the skull through the vertex without intravenous contrast. RADIATION DOSE REDUCTION: This exam was performed according to the departmental dose-optimization program which includes automated exposure control, adjustment of the mA and/or kV according to patient size and/or use of iterative reconstruction technique. COMPARISON:  Brain MRI 04/03/2023 and earlier. FINDINGS: Brain: Evolving large right MCA territory infarct with petechial hemorrhagic transformation in multiple areas. But there is decreased mass effect on the right lateral ventricle and no midline shift. No intraventricular hemorrhage. No basilar cistern hemorrhage. Underlying chronic severe encephalomalacia in the other major vascular territories of the bilateral cerebral and cerebellar hemispheres, stable. Vascular: Calcified atherosclerosis at the skull base. No suspicious intracranial vascular hyperdensity. Skull: No acute osseous abnormality identified. Sinuses/Orbits: Visualized paranasal sinuses and mastoids are stable and well aerated. Other: Slight leftward gaze now. Visualized scalp soft tissues are within normal limits. ASPECTS [facility] Stroke Program Early CT Score) Total score (0-10 with 10 being normal): 10 when allowing for previous infarcts, petechial intracranial hemorrhage is present. IMPRESSION: 1. Multifocal petechial hemorrhagic transformation of the large Right MCA infarct last month. No significant intracranial mass effect. No malignant hemorrhage or convincing extra-axial blood. 2. Other bilateral cerebral and cerebellar severe chronic ischemic disease appears stable. 3. These results were communicated to Dr. Derry Lory at 9:53 am on 05/18/2023 by text page via the [facility] messaging system. Electronically Signed   By: Odessa Fleming M.D.   On: 05/18/2023 09:54      Signature  -   Susa Raring M.D on 05/19/2023 at 9:34 AM   -  To page go to [email or web address]

## 2023-05-19 NOTE — Evaluation (Signed)
Speech Language Pathology Evaluation Patient Details Name: [name] MRN: [id number] DOB: Sep 16, 1956 Today's Date: 05/19/2023 Time: 7829-5621 SLP Time Calculation (min) (ACUTE ONLY): 8 min  Problem List:  Patient Active Problem List   Diagnosis Date Noted   Acute focal neurological deficit 05/18/2023   Fever 04/05/2023   Vascular dementia (HCC) 04/05/2023   Dysphagia 04/05/2023   History of CVA with residual deficit 04/03/2023   Hypokalemia 08/31/2019   Cocaine abuse (HCC) 08/29/2019   Hyperlipidemia 08/29/2019   Confusion 08/29/2019   Aphasia as late effect of cerebrovascular accident 05/14/2013   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   Spastic hemiplegia affecting dominant side (HCC) 03/12/2013   HTN (hypertension) 02/03/2013   Tobacco abuse 02/03/2013   Past Medical History:  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   Heart murmur    Hypertension    Psoriasis    "back and stomach" (02/03/2013)   Stroke (HCC) 02/01/2013   left frontal CVA and symptoms of right arm and leg weakness and expressive aphasia/notes 02/03/2013 (02/03/2013)   Past Surgical History:  Past Surgical History:  Procedure Laterality Date   NO PAST SURGERIES     TEE WITHOUT CARDIOVERSION N/A 02/09/2013   Procedure: TRANSESOPHAGEAL ECHOCARDIOGRAM (TEE);  Surgeon: Lewayne Bunting, MD;  Location: [facility] ENDOSCOPY;  Service: Cardiovascular;  Laterality: N/A;   HPI:  [name] is a 67 yo male presenting to ED 9/1 from SNF with worsening mental status and new onset aphasia. Admitted July 2024 with significant R MCA infarct and was discharged with L sided deficits. MRI Brain with continued interval evolution of previously identified large R MCA distribution infarct. EEG indicative of global cerebral dysfunction. Most recently seen by  SLP 04/07/23 with increased L sided weakness and signs of oral dysphagia. He was recommended a Dys 1 diet with thin liquids at that time. Pt has a history of cognitive difficulties evidenced by SLP sessions in December 2020 and July 2024, including deficits related to awareness, impulsivity, problem solving, and speech intelligibility. Pt scored WFL on all tasks related to repetition, attention, naming, and problem solving during administration of portions fo the Cognistat July 2024, although presented with dysarthria. PMH includes posterior dimension, prior CVA, HTN, HLD   Assessment / Plan / Recommendation Clinical Impression  Per RN, pt has not made any attempts at verbalization throughout this admission. Per H&P note and previous SLP note, rehab staff note this is different than baseline as pt was answering [facility] on prior cognitive evaluation administered 04/03/23. Today, pt unable to state name or answer basic biographical yes/no questions. He made minimal attempts to communicate verbally, although did occasionally move his lips without any vocalization. Pt effectively able to follow one-step commands, at times requiring visual cueing. Given his presentation in July, suspect pt is experiencing acute cognitive linguistic deficits although difficult to assess full extent. Will continue to follow for further evaluation.    SLP Assessment  SLP Recommendation/Assessment: Patient needs continued Speech Lanaguage Pathology Services SLP Visit Diagnosis: Aphasia (R47.01);Cognitive communication deficit (R41.841)    Recommendations for follow up therapy are one component of a multi-disciplinary discharge planning process, led by the attending physician.  Recommendations may be updated based on patient status, additional functional criteria and insurance authorization.    Follow Up Recommendations  Skilled nursing-short term rehab (<3 hours/day)  Assistance Recommended at Discharge  Frequent or constant  Supervision/Assistance  Functional Status Assessment Patient has had a recent decline in their functional status and demonstrates the ability to make significant improvements in function in a reasonable and predictable amount of time.  Frequency and Duration min 2x/week  2 weeks      SLP Evaluation Cognition  Overall Cognitive Status: Impaired/Different from baseline Arousal/Alertness: Awake/alert Orientation Level: Other (comment) (pt not answering questions) Attention: Sustained Sustained Attention: Impaired Sustained Attention Impairment: Verbal basic;Functional basic Awareness: Impaired Awareness Impairment: Intellectual impairment Problem Solving: Impaired Problem Solving Impairment: Functional basic       Comprehension  Auditory Comprehension Overall Auditory Comprehension: Appears within functional limits for tasks assessed    Expression Expression Primary Mode of Expression: Verbal Verbal Expression Overall Verbal Expression: Other (comment) (pt not responding verbally to assess)   Oral / Motor  Oral Motor/Sensory Function Overall Oral Motor/Sensory Function: Moderate impairment Facial ROM: Reduced left;Suspected CN VII (facial) dysfunction Facial Symmetry: Abnormal symmetry left;Suspected CN VII (facial) dysfunction Facial Strength: Reduced left;Suspected CN VII (facial) dysfunction Facial Sensation: Reduced left;Suspected CN V (Trigeminal) dysfunction Lingual ROM: Reduced left;Suspected CN XII (hypoglossal) dysfunction Lingual Symmetry: Abnormal symmetry left;Suspected CN XII (hypoglossal) dysfunction Lingual Strength: Suspected CN XII (hypoglossal) dysfunction;Reduced Lingual Sensation: Reduced;Suspected CN VII (facial) dysfunction-anterior 2/3 tongue Motor Speech Overall Motor Speech: Other (comment) (pt not currently verbalizing responses)            Gwynneth Aliment, M.A., CF-SLP Speech Language Pathology, Acute Rehabilitation Services  Secure Chat  preferred [phone]  05/19/2023, 12:23 PM

## 2023-05-19 NOTE — Evaluation (Signed)
Physical Therapy Evaluation Patient Details Name: [name] MRN: [id number] DOB: 14-Apr-1956 Today's Date: 05/19/2023  History of Present Illness  Pt is a 67 y/o male presenting 05/18/23 from SNF with aphasia and weakness. MRI evolution of large R MCA CVA  PMH: CVA with residual spastic hemiplegia, R CVA with L hemiparesis,  HTN, HLD, cocaine use, dementia  Clinical Impression   Pt admitted secondary to problem above with deficits below. PTA patient was apparently receiving therapies at SNF (per 03/2023 chart review). Status at SNF unknown, but in July was able to sit EOB with min assist and transfer with mod assist. Pt currently requires total to max assist for all mobility. He does follow simple commands with incr time and multi-modal cues. He has had a decline in his mobility and may benefit from PT to address problems listed below.Will continue to follow acutely to maximize functional mobility independence and safety.           If plan is discharge home, recommend the following: Two people to help with walking and/or transfers;Assistance with feeding;Direct supervision/assist for medications management;Direct supervision/assist for financial management;Assist for transportation   Can travel by private vehicle   No    Equipment Recommendations None recommended by PT  Recommendations for Other Services       Functional Status Assessment Patient has had a recent decline in their functional status and demonstrates the ability to make significant improvements in function in a reasonable and predictable amount of time.     Precautions / Restrictions Precautions Precautions: Fall      Mobility  Bed Mobility Overal bed mobility: Needs Assistance Bed Mobility: Sit to Supine       Sit to supine: Total assist, +2 for physical assistance   General bed mobility comments: up on EOB on PT arrival; on return to supine pt did not initiate any portion of movement    Transfers Overall  transfer level: Needs assistance Equipment used: 2 person hand held assist Transfers: Sit to/from Stand Sit to Stand: +2 physical assistance, Max assist           General transfer comment: assist for coming over BOS and fully extending trunk and legs; LLE in partial flexion, but not buckling and stood for ~45 seconds    Ambulation/Gait               General Gait Details: unable  Stairs            Wheelchair Mobility     Tilt Bed    Modified Rankin (Stroke Patients Only) Modified Rankin (Stroke Patients Only) Pre-Morbid Rankin Score: Severe disability Modified Rankin: Severe disability     Balance Overall balance assessment: Needs assistance Sitting-balance support: No upper extremity supported, Feet supported Sitting balance-Leahy Scale: Zero Sitting balance - Comments: falls posterior and to his left   Standing balance support: No upper extremity supported Standing balance-Leahy Scale: Poor                               Pertinent Vitals/Pain Pain Assessment Pain Assessment: Faces Faces Pain Scale: No hurt Pain Intervention(s): Monitored during session    Home Living Family/patient expects to be discharged to:: Skilled nursing facility                        Prior Function Prior Level of Function : Patient poor historian/Family not available  Mobility Comments: per previous medical record, pt mostly bedbound at SNF ADLs Comments: Per previous record,  at SNF pt able to self feed though unsure of other ADL needs/assist levels     Extremity/Trunk Assessment   Upper Extremity Assessment Upper Extremity Assessment: Defer to OT evaluation    Lower Extremity Assessment Lower Extremity Assessment: RLE deficits/detail;LLE deficits/detail RLE Deficits / Details: pt resists PROM in supine; able to sit EOB with hip flexed to 90, knee 100 degrees; at least 3-/5 strength LLE Deficits / Details: spasticity noted with  PROM; no active movement noted    Cervical / Trunk Assessment Cervical / Trunk Assessment: Kyphotic  Communication   Communication Communication: Difficulty communicating thoughts/reduced clarity of speech Cueing Techniques: Verbal cues;Gestural cues;Tactile cues  Cognition Arousal: Alert Behavior During Therapy: Flat affect Overall Cognitive Status: Difficult to assess                                 General Comments: pt following one step commands intermittently, often with repetition or multi-modal cues        General Comments General comments (skin integrity, edema, etc.): HR 120 at rest, 126 with sitting EOB and up to 142 with standing; by end of session 121    Exercises     Assessment/Plan    PT Assessment Patient needs continued PT services  PT Problem List Decreased strength;Decreased range of motion;Decreased activity tolerance;Decreased balance;Decreased mobility;Decreased knowledge of use of DME;Cardiopulmonary status limiting activity;Impaired tone       PT Treatment Interventions DME instruction;Functional mobility training;Therapeutic activities;Balance training;Neuromuscular re-education;Therapeutic exercise;Patient/family education    PT Goals (Current goals can be found in the Care Plan section)  Acute Rehab PT Goals Patient Stated Goal: unable to state PT Goal Formulation: Patient unable to participate in goal setting Time For Goal Achievement: 06/02/23 Potential to Achieve Goals: Fair    Frequency Min 1X/week     Co-evaluation               AM-PAC PT "6 Clicks" Mobility  Outcome Measure Help needed turning from your back to your side while in a flat bed without using bedrails?: Total Help needed moving from lying on your back to sitting on the side of a flat bed without using bedrails?: Total Help needed moving to and from a bed to a chair (including a wheelchair)?: Total Help needed standing up from a chair using your arms  (e.g., wheelchair or bedside chair)?: Total Help needed to walk in hospital room?: Total Help needed climbing 3-5 steps with a railing? : Total 6 Click Score: 6    End of Session   Activity Tolerance: Treatment limited secondary to medical complications (Comment) (tachycardia) Patient left: in bed;with call bell/phone within reach;with bed alarm set;with SCD's reapplied;Other (comment) (bil prevalon)   PT Visit Diagnosis: Hemiplegia and hemiparesis Hemiplegia - Right/Left: Left Hemiplegia - dominant/non-dominant: Non-dominant Hemiplegia - caused by: Cerebral infarction    Time: 1227-1237 PT Time Calculation (min) (ACUTE ONLY): 10 min   Charges:   PT Evaluation $PT Eval Low Complexity: 1 Low   PT General Charges $$ ACUTE PT VISIT: 1 Visit          Jerolyn Center, PT Acute Rehabilitation Services  Office [phone]   Zena Amos 05/19/2023, 1:13 PM

## 2023-05-19 NOTE — Plan of Care (Signed)
  Problem: Education: Goal: Knowledge of General Education information will improve Description: Including pain rating scale, medication(s)/side effects and non-pharmacologic comfort measures Outcome: Progressing   Problem: Clinical Measurements: Goal: Will remain free from infection Outcome: Progressing   Problem: Skin Integrity: Goal: Risk for impaired skin integrity will decrease Outcome: Progressing   

## 2023-05-19 NOTE — TOC CAGE-AID Note (Signed)
Transition of Care [facility]) - CAGE-AID Screening   Patient Details  Name: [name] MRN: [id number] Date of Birth: 1956-07-01  [facility]) CM/SW Contact:    Mearl Latin, LCSW Phone Number: 05/19/2023, 3:03 PM   Clinical Narrative: Patient unable to participate in screening at this time. He is a long term care resident at [facility].   CAGE-AID Screening: Substance Abuse Screening unable to be completed due to: : Patient unable to participate

## 2023-05-19 NOTE — TOC Initial Note (Signed)
Transition of Care [facility]) - Initial/Assessment Note    Patient Details  Name: [name] MRN: [id number] Date of Birth: 1955/12/17  Transition of Care [facility]) CM/SW Contact:    Mearl Latin, LCSW Phone Number: 05/19/2023, 2:56 PM  Clinical Narrative:                 Patient admitted from [facility] under long term care. CSW will continue to follow.   Expected Discharge Plan: Skilled Nursing Facility Barriers to Discharge: Continued Medical Work up   Patient Goals and CMS Choice            Expected Discharge Plan and Services In-house Referral: Clinical Social Work     Living arrangements for the past 2 months: Skilled Nursing Facility                                      Prior Living Arrangements/Services Living arrangements for the past 2 months: Skilled Nursing Facility Lives with:: Facility Resident Patient language and need for interpreter reviewed:: Yes        Need for Family Participation in Patient Care: Yes (Comment) Care giver support system in place?: Yes (comment)   Criminal Activity/Legal Involvement Pertinent to Current Situation/Hospitalization: No - Comment as needed  Activities of Daily Living Home Assistive Devices/Equipment: None ADL Screening (condition at time of admission) Patient's cognitive ability adequate to safely complete daily activities?: No Is the patient deaf or have difficulty hearing?: No Does the patient have difficulty seeing, even when wearing glasses/contacts?: No Does the patient have difficulty concentrating, remembering, or making decisions?: Yes Patient able to express need for assistance with ADLs?: Yes Does the patient have difficulty dressing or bathing?: Yes Independently performs ADLs?: No Communication: Needs assistance, Dependent Does the patient have difficulty walking or climbing stairs?: Yes Weakness of Legs: Both Weakness of Arms/Hands: Both  Permission Sought/Granted                  Emotional  Assessment   Attitude/Demeanor/Rapport: Unable to Assess Affect (typically observed): Unable to Assess Orientation: :  (Not answering questions) Alcohol / Substance Use: Not Applicable Psych Involvement: No (comment)  Admission diagnosis:  Aphasia [R47.01] Acute focal neurological deficit [R29.818] Patient Active Problem List   Diagnosis Date Noted   Acute focal neurological deficit 05/18/2023   Fever 04/05/2023   Vascular dementia (HCC) 04/05/2023   Dysphagia 04/05/2023   History of CVA with residual deficit 04/03/2023   Hypokalemia 08/31/2019   Cocaine abuse (HCC) 08/29/2019   Hyperlipidemia 08/29/2019   Confusion 08/29/2019   Aphasia as late effect of cerebrovascular accident 05/14/2013   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   Spastic hemiplegia affecting dominant side (HCC) 03/12/2013   HTN (hypertension) 02/03/2013   Tobacco abuse 02/03/2013   PCP:  Clovis [name], L.August Saucer, MD Pharmacy:   [id number] [place], [place] - [phone] AT [facility] OF [facility] & [address] [place] [address] Phone: [phone] Fax: [phone]     Social Determinants of Health (SDOH) Social History: SDOH Screenings   Food Insecurity: Patient Unable To Answer (05/18/2023)  Housing: Patient Unable To Answer (05/18/2023)  Recent Concern: Housing - High Risk (04/04/2023)  Transportation Needs: Patient Unable To Answer (05/18/2023)  Utilities: Patient Unable To Answer (05/18/2023)  Tobacco Use: Medium Risk (05/18/2023)   SDOH Interventions:     Readmission Risk Interventions  No data to display

## 2023-05-19 NOTE — Consult Note (Signed)
Consultation Note Date: 05/19/2023   Patient Name: [name]  DOB: 1955/11/30  MRN: [id number]  Age / Sex: 67 y.o., male  PCP: Clovis [name], L.August Saucer, MD Referring Physician: Synetta Fail, MD  Reason for Consultation: Establishing goals of care  HPI/Patient Profile: 67 y.o. male   admitted on 05/18/2023 with past     medical history significant for prior right MCA CVA last month, vascular dementia,  hypertension, hyperlipidemia presenting after worsening mental status at rehab facility/[facility].  MRI of the brain suggestive of evolution of recent right MCA stroke with some hemorrhagic conversion.    Family face treatment options decisions, advanced directive decisions  Clinical Assessment and Goals of Care:  This NP Lorinda Creed reviewed medical records, received report from team, assessed the patient and then meet at the patient's bedside and then spoke to his brother/ Deatra Ina and sister/ Hebert Soho by telephone to discuss diagnosis, prognosis, GOC, EOL wishes disposition and options.   Concept of Palliative Care was introduced as specialized medical care for people and their families living with serious illness.  If focuses on providing relief from the symptoms and stress of a serious illness.  The goal is to improve quality of life for both the patient and the family. Values and goals of care important to patient and family were attempted to be elicited.  Family report continued physical, functional and cognitive decline over the past 6 months.  Significantly worsening over the past few weeks.  [name] died in April 03, 2024she had been his main caregiver at home.  Patient has been living at [facility] since Dec 18, 2022.  Education  offered on the likely trajectory of Mr. Taras state of wellness into the future secondary to overall failure to thrive. Long term poor prognosis.  Education offered on the  limitations of medical interventions to prolong quality of life when the body does fail to thrive.  Education offered on the difference between a full medical support path and a palliative comfort path for this patient at this time in this situation.    A  discussion was had today regarding advanced directives.  Concepts specific to code status, artifical feeding and hydration, continued IV antibiotics and rehospitalization was had.    Natural trajectory and expectations at EOL were discussed.    Questions and concerns addressed.  Patient  encouraged to call with questions or concerns.             Johnny and Misty Stanley plan to speak to their other siblings early this afternoon and ask for a follow-up phone conversation to further delineate goals of care.    I called back later in the afternoon and continued conversation regarding above concepts and next steps in treatment plan.  They tell me that as a family all are in agreement that comfort and dignity should be the focus of care allowing a natural death.      PMT will continue to support holistically.           Johnny Knabe Elnita Maxwell  LEGAL GUARDIAN--family emailed me a copy of the guardianship papers naming his brother Madox Stallings as his legal guardian  Plan is to meet with Johnny tomorrow morning at 11 AM to complete a MOST form    SUMMARY OF RECOMMENDATIONS    Code Status/Advance Care Planning: DNR-documented today after detailed conversations with [facility]/legal guardian by telephone No artificial feeding or hydration now or in the future No further diagnostics; labs, scans x-rays No IV antibiotic use   Symptom Management:  Pain/dyspnea :Roxanol  Palliative Prophylaxis:  Aspiration, Bowel Regimen, Delirium Protocol, Frequent Pain Assessment, and Oral Care  Additional Recommendations (Limitations, Scope, Preferences): Full Comfort Care  Psycho-social/Spiritual:  Desire for further Chaplaincy  support:no Additional Recommendations: Education on Hospice  Prognosis:  Unable to determine-will evaluate in 24 to 48-hour for more accurate prognosis  Discharge Planning: To Be Determined      Primary Diagnoses: Present on Admission:  HTN (hypertension)  Vascular dementia (HCC)  Hyperlipidemia  Acute focal neurological deficit   I have reviewed the medical record, interviewed the patient and family, and examined the patient. The following aspects are pertinent.  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   Heart murmur    Hypertension    Psoriasis    "back and stomach" (02/03/2013)   Stroke (HCC) 02/01/2013   left frontal CVA and symptoms of right arm and leg weakness and expressive aphasia/notes 02/03/2013 (02/03/2013)   Social History   Socioeconomic History   Marital status: Married    Spouse name: Not on file   Number of children: 2   Years of education: 12   Highest education level: Not on file  Occupational History   Occupation: N/A  Tobacco Use   Smoking status: Former    Current packs/day: 0.00    Average packs/day: 0.3 packs/day for 37.0 years (9.3 ttl pk-yrs)    Types: Cigarettes    Start date: 04/13/1976    Quit date: 04/13/2013    Years since quitting: 10.1   Smokeless tobacco: Never   Tobacco comments:    using patch to quit  Substance and Sexual Activity   Alcohol use: Yes    Alcohol/week: 17.0 standard drinks of alcohol    Types: 17 Shots of liquor per week    Comment: 02/03/2013 "about 1/5th of grey goose/wk"   Drug use: Yes    Types: Cocaine    Comment: prior to stroke/hospitalization   Sexual activity: Yes    Partners: Female  Other Topics Concern   Not on file  Social History Narrative   Right handed    Lives with wife   Social Determinants of Health   Financial Resource  Strain: Not on file  Food Insecurity: Patient Unable To Answer (05/18/2023)   Hunger Vital Sign    Worried About Running Out of Food in the Last Year: Patient unable to answer    Ran Out of Food in the Last Year: Patient unable to answer  Transportation Needs: Patient Unable To Answer (05/18/2023)   PRAPARE - Transportation    Lack of Transportation (Medical): Patient unable to answer    Lack of Transportation (Non-Medical): Patient unable to answer  Physical Activity: Not on file  Stress: Not on file  Social Connections: Not on file   Family History  Problem Relation Age of Onset   Diabetes  Mother    Scheduled Meds:  atorvastatin  40 mg Oral QHS   divalproex  125 mg Oral BID   PARoxetine  10 mg Oral QHS   Continuous Infusions:  dextrose 75 mL/hr at 05/19/23 0612   PRN Meds:.acetaminophen **OR** acetaminophen, polyethylene glycol Medications Prior to Admission:  Prior to Admission medications   Medication Sig Start Date End Date Taking? Authorizing Provider  Amino Acids-Protein Hydrolys (PRO-STAT AWC) LIQD Take 30 mLs by mouth 2 (two) times daily.   Yes [provider]  aspirin EC 81 MG tablet Take 1 tablet (81 mg total) by mouth daily. Swallow whole. Patient taking differently: Take 162 mg by mouth daily. 04/08/23 07/07/23 Yes Dorcas Carrow, MD  atorvastatin (LIPITOR) 40 MG tablet Take 40 mg by mouth at bedtime.   Yes [provider]  clopidogrel (PLAVIX) 75 MG tablet Take 1 tablet (75 mg total) by mouth daily. 04/08/23 07/07/23 Yes Dorcas Carrow, MD  divalproex (DEPAKOTE SPRINKLE) 125 MG capsule Take 125 mg by mouth 2 (two) times daily. 03/07/23  Yes [provider]  metoprolol tartrate (LOPRESSOR) 25 MG tablet Take 1 tablet (25 mg total) by mouth 2 (two) times daily. 04/08/23 05/18/23 Yes Ghimire, Lyndel Safe, MD  PARoxetine (PAXIL) 10 MG tablet Take 1 tablet (10 mg total) by mouth at bedtime. 04/08/23 05/18/23 Yes Ghimire, Lyndel Safe, MD  senna-docusate (SENOKOT-S)  8.6-50 MG tablet Take 1 tablet by mouth 2 (two) times daily. 09/16/19  Yes Glade Lloyd, MD  Vitamin D, Ergocalciferol, (DRISDOL) 1.25 MG (50000 UNIT) CAPS capsule Take 50,000 Units by mouth every 7 (seven) days. Given on Mondays 03/17/23  Yes [provider]   Allergies  Allergen Reactions   Shellfish Allergy Itching and Other (See Comments)    Shrimp, especially   Review of Systems  Unable to perform ROS: Mental status change    Physical Exam Constitutional:      Appearance: He is normal weight. He is ill-appearing.  Cardiovascular:     Rate and Rhythm: Tachycardia present.  Pulmonary:     Effort: Pulmonary effort is normal.  Skin:    General: Skin is warm and dry.  Neurological:     Mental Status: He is alert.     Comments: - Nonverbal, unable to follow simple commands currently     Vital Signs: BP (!) 134/94 (BP Location: Left Arm)   Pulse (!) 113   Temp 97.9 F (36.6 C) (Oral)   Resp 16   Wt 65.3 kg   SpO2 98%   BMI 21.26 kg/m  Pain Scale: Faces       SpO2: SpO2: 98 % O2 Device:SpO2: 98 % O2 Flow Rate: .   IO: Intake/output summary:  Intake/Output Summary (Last 24 hours) at 05/19/2023 1202 Last data filed at 05/19/2023 0535 Gross per 24 hour  Intake 3 ml  Output 150 ml  Net -147 ml    LBM:   Baseline Weight: Weight: 65 kg Most recent weight: Weight: 65.3 kg     Palliative Assessment/Data: 30 %    Time: 90 minutes  Discussed with Dr. Thedore Mins, condition of care team and bedside RN   Signed by: Lorinda Creed, NP   Please contact Palliative Medicine Team phone at [phone] for questions and concerns.  For individual provider: See Loretha Stapler

## 2023-05-19 NOTE — Progress Notes (Addendum)
STROKE TEAM PROGRESS NOTE   BRIEF HPI [name] is a 67 y.o. 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    SIGNIFICANT HOSPITAL EVENTS   INTERIM HISTORY/SUBJECTIVE Patient is laying in bed in NAD.  CT head with evolving right MCA infarct with hemorrhagic transformation.    OBJECTIVE  CBC    Component Value Date/Time   WBC 9.8 05/19/2023 0316   RBC 5.43 05/19/2023 0316   HGB 14.4 05/19/2023 0316   HCT 47.7 05/19/2023 0316   PLT 234 05/19/2023 0316   MCV 87.8 05/19/2023 0316   MCH 26.5 05/19/2023 0316   MCHC 30.2 05/19/2023 0316   RDW 14.3 05/19/2023 0316   LYMPHSABS 1.6 05/18/2023 0937   MONOABS 0.9 05/18/2023 0937   EOSABS 0.0 05/18/2023 0937   BASOSABS 0.0 05/18/2023 0937    BMET    Component Value Date/Time   NA 149 (H) 05/19/2023 0316   K 3.8 05/19/2023 0316   CL 112 (H) 05/19/2023 0316   CO2 29 05/19/2023 0316   GLUCOSE 123 (H) 05/19/2023 0316   BUN 37 (H) 05/19/2023 0316   CREATININE 0.95 05/19/2023 0316   CALCIUM 8.9 05/19/2023 0316   GFRNONAA >60 05/19/2023 0316    IMAGING past 24 hours EEG adult  Result Date: 05/18/2023 Jefferson Fuel, MD     05/18/2023 11:13 PM Routine EEG Report [name] is a 67 y.o. male with a history of altered mental status and right hemispheric stroke who is undergoing an EEG to evaluate for seizures. Report: This EEG was acquired with electrodes placed according to the International 10-20 electrode system (including Fp1, Fp2, F3, F4, C3, C4, P3, P4, O1, O2, T3, T4, T5, T6, A1, A2, Fz, Cz, Pz). The following electrodes were missing or displaced: none. The occipital dominant rhythm was 6-7 Hz with superimposed focal slowing on the right. This activity is reactive to stimulation. Drowsiness was manifested by background fragmentation; deeper stages of sleep were identified by K complexes and sleep spindles.  There were no interictal epileptiform discharges. There were no electrographic seizures identified. Photic stimulation and hyperventilation were not performed. Impression and clinical correlation: This EEG was obtained while awake and asleep and is abnormal due to mild diffuse slowing indicative of global cerebral dysfunction and superimposed focal slowing over the region of his stroke on the right. Epileptiform abnormalities were not seen during this recording. Bing Neighbors, MD [facility] [phone] If 7pm- 7am, please page neurology on call as listed in AMION.   DG Abd Portable 1V  Result Date: 05/18/2023 CLINICAL DATA:  Altered mental status. Radiograph ordered for MRI clearance. EXAM: PORTABLE ABDOMEN - 1 VIEW COMPARISON:  None Available. FINDINGS: The bowel gas pattern is normal. No radio-opaque calculi or other significant radiographic abnormality are seen. No radiopaque foreign object to prohibit MRI. IMPRESSION: No radiopaque foreign object to prohibit MRI. Electronically Signed   By: Minerva Fester M.D.   On: 05/18/2023 20:19   MR BRAIN WO CONTRAST  Result Date: 05/18/2023 CLINICAL DATA:  Initial evaluation for neuro deficit, stroke suspected. EXAM: MRI HEAD WITHOUT CONTRAST TECHNIQUE: Multiplanar, multiecho pulse sequences of the brain and surrounding structures were obtained without intravenous contrast. COMPARISON:  Comparison made with prior CTs from earlier the same day as well as previous MRI from 04/03/2023. FINDINGS: Brain: Examination mildly degraded by motion artifact. Mildly advanced cerebral atrophy for age. Patchy and  confluent T2/FLAIR hyperintensity involving the supratentorial cerebral white matter, pons, and cerebellum, most consistent with chronic small vessel ischemic disease, fairly advanced in nature. Chronic left ACA distribution infarct involving the left cerebral hemisphere. Additional chronic right PCA infarct involving the right temporal occipital region.  Multifocal chronic bilateral cerebellar infarcts. There has been continued interval evolution of previously identified large right MCA distribution infarct. Developing cystic encephalomalacia and laminar necrosis seen throughout the area of infarction. Superimposed multifocal areas of petechial hemorrhage again seen, more pronounced in appearance by MRI as compared to prior CT. No frank intraparenchymal hematoma or malignant hemorrhagic transformation. No other new areas of acute or interval infarction. No other acute intracranial hemorrhage. Few scattered chronic micro hemorrhages noted, small vessel/hypertensive in nature. No mass lesion or midline shift. Ventricular prominence related to underlying atrophy and chronic ischemic changes without hydrocephalus. No extra-axial fluid collection. Pituitary gland suprasellar region within normal limits. Vascular: Major intracranial vascular flow voids are maintained, although the right ICA flow void is mildly heterogeneous, likely related to the large right MCA territory infarct. Skull and upper cervical spine: Cranial junction with normal limits. Bone marrow signal intensity normal. No scalp soft tissue abnormality. Sinuses/Orbits: Globes orbital soft tissues within normal limits. Paranasal sinuses are largely clear. No significant mastoid effusion. Other: None. IMPRESSION: 1. Continued interval evolution of previously identified large right MCA distribution infarct. Superimposed multifocal areas of petechial hemorrhage again seen, more pronounced in appearance by MRI as compared to recent CT. No frank intraparenchymal hematoma or malignant hemorrhagic transformation. 2. No other new acute intracranial abnormality. 3. Underlying atrophy with advanced chronic microvascular ischemic disease, with multiple additional chronic bilateral cerebral and cerebellar infarcts as above. Electronically Signed   By: Rise Mu M.D.   On: 05/18/2023 19:43   CT HEAD WO  CONTRAST ( )  Result Date: 05/18/2023 CLINICAL DATA:  Follow-up examination for stroke. EXAM: CT HEAD WITHOUT CONTRAST TECHNIQUE: Contiguous axial images were obtained from the base of the skull through the vertex without intravenous contrast. RADIATION DOSE REDUCTION: This exam was performed according to the departmental dose-optimization program which includes automated exposure control, adjustment of the mA and/or kV according to patient size and/or use of iterative reconstruction technique. COMPARISON:  CT from earlier the same day. FINDINGS: Brain: Examination severely degraded by motion artifact, limiting assessment. Large evolving subacute right MCA territory infarct again seen. Superimposed multifocal areas of petechial hemorrhagic transformation again seen, grossly stable from prior CT. No progressive mass effect or midline shift. Multiple additional chronic infarcts involving the bilateral cerebral hemispheres and cerebellum again noted. Underlying chronic microvascular ischemic disease. No visible new hemorrhage or large vessel territory infarct. No mass lesion or midline shift. Stable ventricular size and morphology without hydrocephalus. No visible extra-axial fluid collection. Vascular: No visible hyperdense vessel on this motion degraded exam. Skull: Scalp soft tissues and calvarium demonstrate no new finding. Sinuses/Orbits: Globes and orbital soft tissues grossly within normal limits, although evaluation limited by motion. Paranasal sinuses are largely clear. No significant mastoid effusion. Other: None. IMPRESSION: 1. Severely motion degraded exam. 2. Large evolving subacute right MCA territory infarct with superimposed multifocal areas of petechial hemorrhage, grossly stable from prior. No progressive mass effect or midline shift. 3. No other new acute intracranial abnormality. 4. Underlying multifocal chronic bilateral cerebral and cerebellar infarcts with severe chronic microvascular ischemic  disease. Electronically Signed   By: Rise Mu M.D.   On: 05/18/2023 19:31    Vitals:   05/19/23 0539 05/19/23 0800 05/19/23 1137 05/19/23  1138  BP:  (!) 134/94    Pulse:  (!) 113    Resp:  16    Temp:  97.9 F (36.6 C)    TempSrc:  Oral Oral Oral  SpO2:  96% 98%   Weight: 65.3 kg        PHYSICAL EXAM General:  critically ill male Psych:  Mood and affect appropriate for situation CV: Regular rate and rhythm on monitor Respiratory:  Regular, unlabored respirations on room air GI: Abdomen soft and nontender   NEURO:  Mental Status: he is awake, global aphasia, can follow a few very simple commands on right  Cranial Nerves:  II: PERRL. Visual fields full. Blinks to threat bilaterally  III, IV, VI: EOMI. Eyelids elevate symmetrically. Right gaze can cross midline  V: Sensation is intact to light touch and symmetrical to face.  VII: left facial droop  VIII: hearing intact to voice. IX, X: Palate elevates symmetrically. Phonation is normal.  XB:[id number] shrug 5/5. XII: tongue is midline without fasciculations. Motor: left hemiparesis, right side moves spontaneously Tone: is normal and bulk is normal Sensation- responds to noxious stimuli   Coordination:unable to assess due to aphasia  Gait- deferred   ASSESSMENT/PLAN  Hemorrhagic transformation of Right MCA from last month  Code Stroke  CT head Multifocal petechial hemorrhagic transformation of the large Right MCA infarct last month.  MRI  Continued interval evolution of previously identified large right MCA distribution infarct. Superimposed multifocal areas of petechial hemorrhage again seen 2D Echo on 7/19 EF 65-70%. severely increased left ventricular hypertrophy.  EEG mild diffuse slowing indicative of global cerebral dysfunction and superimposed focal slowing over the region of his stroke on the right.  LDL 52 HgbA1c 5.5 VTE prophylaxis -heparin subcu aspirin 81 mg daily and clopidogrel 75 mg daily  prior to admission, now on No antithrombotic given hemorrhagic transformation.  However, may consider aspirin 81 if family wishes for aggressive medical care. Therapy recommendations: SNF Disposition:  pending, palliative care consulted.  Hx of Stroke/TIA 04/04/2023 admitted for left-sided weakness, right gaze and left facial droop.  CT possible right MCA infarct.  CTA head and neck right M2 occlusion, right ICA siphon occlusion.  MRI showed right MCA large infarct.  EF 70 to 75%.  LDL 52, A1c 5.5, UDS negative. Discharged on ASA and plavix for 3 months than Plavix as well as Lipitor 40. 08/2019: Right PCA infarct including large right occipital and medial temporal lobe and thalamic infarct. CTA showed right PCA occlusion, severe bilateral M2 stenosis. UDS positive for cocaine.  LDL 107, A1c 6.2.  EF 65 to 70%.  Started on aspirin and plavix, then aspirin 325mg , and Lipitor 80.  CIR for 9 days, then transferred to SNF.  Residual left hemianopia. 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 UDS positive for marijuana.  LDL 127, A1c 5.7  Hypertension Home meds:  metoprolol 25mg  bid  Stable now Blood Pressure Goal: SBP less than 160  Long-term BP goal normotensive  Hyperlipidemia Home meds:  lipitor 40mg ,  resumed in hospital LDL 52, goal < 70 Continue statin at discharge  Vascular dementia  On depakote and paxil  Palliative care on board  Dysphagia Patient has post-stroke dysphagia, SLP consulted On dysphagia 1 and thin liquid Advance diet as tolerated  Other stroke risk factors Advanced age Former smoker Former substance abuse  Other Active Problems Hypernatremia Na 149, managed by primary team, on D5 @ 75 One-time fever, Temp 100.8, UA negative, CXR negative Tachycardia heart  rate 120s, sinus rhythm  Hospital day # 0  Gevena Mart DNP, ACNPC-AG  [facility]  ATTENDING NOTE: I reviewed above note and agree with the assessment and plan. Pt was seen and examined.    No family at bedside.  Patient lying in bed, lethargic, however eyes open spontaneously, right gaze preference, not able to cross midline, left hemianopsia, not blinking to visual threat on left but able to blink to visual threat on the right.  Paucity of speech, intangible words, severe dysarthria, but able to tell me his name in dysarthric voice.  Follows some simple commands on the right hand, follows limited midline commands.  Right upper extremity lift against gravity, right lower extremity strong withdrawal to pain.  Left upper extremity increased muscle tone in flexed position, no spontaneous movement.  Left lower extremity mild withdrawal to pain.  Left facial droop.  Tongue protrusion not cooperative. Sensation, coordination not cooperative and gait not tested.  CT and MRI showed right MCA hemorrhagic transformation from right MCA infarct last month.  Currently hold off antiplatelet given hemorrhagic transformation.  Palliative care on board for GOC discussion.  Continue statin.  May still consider aspirin 81 if family wishes aggressive care.  PT and OT recommend SNF.  Patient sodium 129, on D5 infusion.  Still has tachycardia, management per primary team.  Will follow.   For detailed assessment and plan, please refer to above/below as I have made changes wherever appropriate.   Marvel Plan, MD PhD Stroke Neurology 05/19/2023 4:15 PM  I discussed with Dr. Thedore Mins. I spent with face-to-face time with the patient, more than 50% of which was spent in counseling and coordination of care, reviewing test results, images and medication, and discussing the diagnosis, treatment plan and potential prognosis. This patient's care requiresreview of multiple databases, neurological assessment, discussion with attending physician and medical decision making of high complexity.      To contact Stroke Continuity provider, please refer to [email or web address]. After hours, contact General Neurology

## 2023-05-19 NOTE — Evaluation (Signed)
Occupational Therapy Evaluation Patient Details Name: [name] MRN: [id number] DOB: 1956/08/30 Today's Date: 05/19/2023   History of Present Illness Pt is a 67 y/o male presenting 05/18/23 from SNF with aphasia and weakness. MRI evolution of large R MCA CVA  PMH: CVA with residual spastic hemiplegia, R CVA with L hemiparesis,  HTN, HLD, cocaine use, dementia   Clinical Impression   Pt admitted for above, no caregiver present to determine PLOF. At this date, pt is non verbal and non ambulatory as he displays poor sitting balance needing max A to total A to maintain. He is not producing vocalizations and is very inconsistent with command following. Pt LUE flaccid (unsure of baseline) and tracks beyond midline but not on command. Any OOB mobility is Max A +2. Pt would benefit from continued acute skilled OT services to address deficits and help transition to next level of care. Patient would benefit from post acute skilled rehab facility with <3 hours of therapy and 24/7 support        If plan is discharge home, recommend the following: Two people to help with walking and/or transfers;Two people to help with bathing/dressing/bathroom;Assistance with cooking/housework;Assistance with feeding;Direct supervision/assist for medications management;Help with stairs or ramp for entrance;Assist for transportation;Direct supervision/assist for financial management;Supervision due to cognitive status    Functional Status Assessment  Patient has had a recent decline in their functional status and demonstrates the ability to make significant improvements in function in a reasonable and predictable amount of time.  Equipment Recommendations  None recommended by OT (defer to next level of care)    Recommendations for Other Services       Precautions / Restrictions Precautions Precautions: Fall Restrictions Weight Bearing Restrictions: No      Mobility Bed Mobility Overal bed mobility: Needs  Assistance Bed Mobility: Sit to Supine, Supine to Sit     Supine to sit: Total assist Sit to supine: Total assist, +2 for physical assistance   General bed mobility comments: Pt able to squeeze therapists hand using RUE when prompted to help with bed mobility. Total A +2 for anterior scooting    Transfers Overall transfer level: Needs assistance Equipment used: 2 person hand held assist Transfers: Sit to/from Stand Sit to Stand: +2 physical assistance, Max assist           General transfer comment: assist for coming over BOS and fully extending trunk and legs; LLE in partial flexion, but not buckling and stood for ~45 seconds. Responds appropriate to cues when prompted to positing head upright      Balance Overall balance assessment: Needs assistance Sitting-balance support: No upper extremity supported, Feet supported Sitting balance-Leahy Scale: Zero Sitting balance - Comments: falls posterior and to his left Postural control: Posterior lean, Left lateral lean Standing balance support: No upper extremity supported Standing balance-Leahy Scale: Zero Standing balance comment: Needs therapist support                           ADL either performed or assessed with clinical judgement   ADL Overall ADL's : Needs assistance/impaired Eating/Feeding: Bed level;Total assistance   Grooming: Total assistance;Bed level   Upper Body Bathing: Total assistance;Bed level   Lower Body Bathing: Total assistance;Bed level   Upper Body Dressing : Total assistance;Bed level   Lower Body Dressing: Total assistance;Bed level   Toilet Transfer: Total assistance   Toileting- Clothing Manipulation and Hygiene: Bed level;Total assistance  General ADL Comments: OOB mobility limited to standing due to poor balance     Vision   Additional Comments: Pt non verbal and not following prompts to use head/hand cues to respond to prompts. Pt able to track therapist across  room but not on command     Perception         Praxis         Pertinent Vitals/Pain Pain Assessment Pain Assessment: Faces Faces Pain Scale: No hurt Pain Intervention(s): Monitored during session     Extremity/Trunk Assessment Upper Extremity Assessment Upper Extremity Assessment: LUE deficits/detail;RUE deficits/detail;Generalized weakness RUE Deficits / Details: Very weak gross grasp, limited shoulder AROM, LUE Deficits / Details: LUE flaccid, pt not appropriately responding to prompts of sensation, PROM to 85* shoulder flexion and 90* elb ext before notable increase in spasticity/hard resistance   Lower Extremity Assessment Lower Extremity Assessment: RLE deficits/detail;LLE deficits/detail RLE Deficits / Details: pt resists PROM in supine; able to sit EOB with hip flexed to 90, knee 100 degrees; at least 3-/5 strength LLE Deficits / Details: spasticity noted with PROM; no active movement noted   Cervical / Trunk Assessment Cervical / Trunk Assessment: Kyphotic   Communication Communication Communication: Difficulty communicating thoughts/reduced clarity of speech (pt not vocalizing or mouthing any words) Cueing Techniques: Tactile cues;Gestural cues;Verbal cues   Cognition Arousal: Alert Behavior During Therapy: Flat affect Overall Cognitive Status: Difficult to assess                                 General Comments: pt following one step commands intermittently, often with repetition or multi-modal cues     General Comments  HR 120 at rest, 126 with sitting EOB and up to 142 with standing; by end of session 121    Exercises     Shoulder Instructions      Home Living Family/patient expects to be discharged to:: Skilled nursing facility                                        Prior Functioning/Environment Prior Level of Function : Patient poor historian/Family not available             Mobility Comments: per previous  medical record, pt mostly bedbound at SNF ADLs Comments: Per previous record,  at SNF pt able to self feed though unsure of other ADL needs/assist levels        OT Problem List: Impaired balance (sitting and/or standing);Impaired UE functional use;Decreased cognition;Decreased strength;Impaired tone      OT Treatment/Interventions: Self-care/ADL training;Balance training;Therapeutic exercise;Therapeutic activities;Neuromuscular education;Patient/family education;Visual/perceptual remediation/compensation;Cognitive remediation/compensation    OT Goals(Current goals can be found in the care plan section) Acute Rehab OT Goals OT Goal Formulation: Patient unable to participate in goal setting Time For Goal Achievement: 06/02/23 Potential to Achieve Goals: Fair ADL Goals Pt Will Perform Eating: with adaptive utensils;with mod assist;bed level Pt Will Perform Grooming: bed level;with mod assist Pt Will Transfer to Toilet: with mod assist;with +2 assist;bedside commode;stand pivot transfer Additional ADL Goal #1: Pt will tolerate 5 mins sitting EOB with CGA in preparation for seated ADLs. Additional ADL Goal #4: Pt will demonstrate improved PROM to RUE by obtaining 100 degress shoulder flexion.  OT Frequency: Min 1X/week    Co-evaluation PT/OT/SLP Co-Evaluation/Treatment: Yes Reason for Co-Treatment: Complexity of the patient's impairments (multi-system involvement);For  patient/therapist safety;To address functional/ADL transfers PT goals addressed during session: Proper use of DME;Balance OT goals addressed during session: ADL's and self-care      AM-PAC OT "6 Clicks" Daily Activity     Outcome Measure Help from another person eating meals?: Total Help from another person taking care of personal grooming?: Total Help from another person toileting, which includes using toliet, bedpan, or urinal?: Total Help from another person bathing (including washing, rinsing, drying)?: Total Help  from another person to put on and taking off regular upper body clothing?: Total Help from another person to put on and taking off regular lower body clothing?: Total 6 Click Score: 6   End of Session Nurse Communication: Other (comment) (Pt would need assist with feeding)  Activity Tolerance: Patient tolerated treatment well Patient left: in bed;with call bell/phone within reach;Other (comment) (With PT at bedside)  OT Visit Diagnosis: Muscle weakness (generalized) (M62.81);Other symptoms and signs involving cognitive function;Other symptoms and signs involving the nervous system (R29.898);Hemiplegia and hemiparesis Hemiplegia - Right/Left: Left Hemiplegia - dominant/non-dominant:  (unsure) Hemiplegia - caused by: Cerebral infarction                Time: 1208-1232 OT Time Calculation (min): 24 min Charges:  OT General Charges $OT Visit: 1 Visit OT Evaluation $OT Eval Moderate Complexity: 1 Mod  05/19/2023  AB, OTR/L  Acute Rehabilitation Services  Office: [phone]   Tristan Schroeder 05/19/2023, 2:01 PM

## 2023-05-20 DIAGNOSIS — R4701 Aphasia: Secondary | ICD-10-CM | POA: Diagnosis not present

## 2023-05-20 DIAGNOSIS — I6389 Other cerebral infarction: Secondary | ICD-10-CM | POA: Diagnosis not present

## 2023-05-20 DIAGNOSIS — R1310 Dysphagia, unspecified: Secondary | ICD-10-CM

## 2023-05-20 DIAGNOSIS — R29818 Other symptoms and signs involving the nervous system: Secondary | ICD-10-CM | POA: Diagnosis not present

## 2023-05-20 DIAGNOSIS — F0150 Vascular dementia without behavioral disturbance: Secondary | ICD-10-CM | POA: Diagnosis not present

## 2023-05-20 MED ORDER — MORPHINE SULFATE (CONCENTRATE) 10 MG/0.5ML PO SOLN: 5.0000 mg | Freq: Three times a day (TID) | ORAL | 0 refills | Status: DC

## 2023-05-20 MED ORDER — MORPHINE SULFATE (CONCENTRATE) 10 MG/0.5ML PO SOLN
5.0000 mg | Freq: Three times a day (TID) | ORAL | Status: DC
  Administered 2023-05-20: 5 mg via ORAL
  Filled 2023-05-20 (×2): qty 0.5

## 2023-05-20 NOTE — Discharge Instructions (Signed)
Disposition.  Residential hospice °Condition.  Guarded °CODE STATUS.  DNR °Activity.  With assistance as tolerated, full fall precautions. °Diet.  Soft with feeding assistance and aspiration precautions. °Goal of care.  Comfort. ° °

## 2023-05-20 NOTE — Consult Note (Signed)
WOC Nurse Consult Note: patient from [facility], has been seen at [facility] in the past but for ankle wounds, no mention of L heel wound  Reason for Consult: L heel wound  Wound type: 1.  Deep tissue Pressure Injuries L lateral and medial foot  2.  Unstageable Pressure Injury L heel  Pressure Injury POA: Yes Measurement: 1.  DTPI L lateral foot 3 cm x 2 cm intact purple maroon discoloration  2.  DTPI L medial foot 1 cm x 1 cm intact purple maroon discoloration  3.  L heel with 4 cm x 5 cm area of black eschar lifting on lateral edge, 90% black eschar 10% pink moist   Drainage (amount, consistency, odor) none  Periwound: intact  Dressing procedure/placement/frequency:  Clean L lateral and medial areas of DTPI with NS, apply Xeroform gauze [place] [phone]) cut to fit purple maroon discoloration daily.  May cover with silicone foam or Kerlix roll gauze.  2.  Clean L heel with NS, paint area of eschar with Betadine twice daily and allow to dry.  May cover with dry gauze and Kerlix roll gauze.  Place bilateral feet in pressure offloading boots. Patient is wearing offloading boots at this visit presumably from home or facility but will certainly suffice.    POC discussed with bedside nurse. WOC team will not follow at this time. Re-consult if further needs arise.   Thank you,    Priscella Mann MSN, RN-BC, [employer] [phone]

## 2023-05-20 NOTE — Plan of Care (Signed)
  Problem: Education: Goal: Knowledge of General Education information will improve Description: Including pain rating scale, medication(s)/side effects and non-pharmacologic comfort measures Outcome: Progressing   Problem: Clinical Measurements: Goal: Ability to maintain clinical measurements within normal limits will improve Outcome: Progressing Goal: Will remain free from infection Outcome: Progressing   Problem: Nutrition: Goal: Adequate nutrition will be maintained Outcome: Progressing   Problem: Safety: Goal: Ability to remain free from injury will improve Outcome: Progressing   Problem: Skin Integrity: Goal: Risk for impaired skin integrity will decrease Outcome: Progressing

## 2023-05-20 NOTE — Progress Notes (Signed)
STROKE TEAM PROGRESS NOTE   BRIEF HPI [name] is a 67 y.o. 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    SIGNIFICANT HOSPITAL EVENTS   INTERIM HISTORY/SUBJECTIVE Patient is laying in bed in NAD. Eyes spontaneously open. Right gaze preference but able to track on the right. Nonverbal, not following commands. Noticed that pt is on comfort care now after palliative care discussed with family about GOC.    OBJECTIVE  CBC    Component Value Date/Time   WBC 9.8 05/19/2023 0316   RBC 5.43 05/19/2023 0316   HGB 14.4 05/19/2023 0316   HCT 47.7 05/19/2023 0316   PLT 234 05/19/2023 0316   MCV 87.8 05/19/2023 0316   MCH 26.5 05/19/2023 0316   MCHC 30.2 05/19/2023 0316   RDW 14.3 05/19/2023 0316   LYMPHSABS 1.6 05/18/2023 0937   MONOABS 0.9 05/18/2023 0937   EOSABS 0.0 05/18/2023 0937   BASOSABS 0.0 05/18/2023 0937    BMET    Component Value Date/Time   NA 149 (H) 05/19/2023 0316   K 3.8 05/19/2023 0316   CL 112 (H) 05/19/2023 0316   CO2 29 05/19/2023 0316   GLUCOSE 123 (H) 05/19/2023 0316   BUN 37 (H) 05/19/2023 0316   CREATININE 0.95 05/19/2023 0316   CALCIUM 8.9 05/19/2023 0316   GFRNONAA >60 05/19/2023 0316    IMAGING past 24 hours No results found.  Vitals:   05/19/23 1538 05/19/23 1600 05/19/23 2000 05/20/23 0000  BP:   (!) 129/100 (!) 127/99  Pulse:   100 (!) 102  Resp:   16 20  Temp: 97.8 F (36.6 C) 97.8 F (36.6 C) 97.6 F (36.4 C) 98 F (36.7 C)  TempSrc:  Oral Oral Oral  SpO2:   98% 100%  Weight:         PHYSICAL EXAM General:  critically ill male Psych:  Mood and affect appropriate for situation CV: Regular rate and rhythm on monitor Respiratory:  Regular, unlabored respirations on room air GI: Abdomen soft and nontender   NEURO:  lethargic, however eyes open spontaneously, right gaze preference, but able to track  to the left, left hemianopsia, not blinking to visual threat on left but able to blink to visual threat on the right. Nonverbal, not following commands today. Left facial droop. Tongue protrusion not cooperative. No spontaneous moving extremities on my encounter. Limited exam as pt on comfort care now.    ASSESSMENT/PLAN  Hemorrhagic transformation of Right MCA from last month  Code Stroke  CT head Multifocal petechial hemorrhagic transformation of the large Right MCA infarct last month.  MRI  Continued interval evolution of previously identified large right MCA distribution infarct. Superimposed multifocal areas of petechial hemorrhage again seen 2D Echo on 7/19 EF 65-70%. severely increased left ventricular hypertrophy.  EEG mild diffuse slowing indicative of global cerebral dysfunction and superimposed focal slowing over the region of his stroke on the right.  LDL 52 HgbA1c 5.5 aspirin 81 mg daily and clopidogrel 75 mg daily prior to admission, now on No antithrombotic given hemorrhagic transformation and in comfort care. Disposition:  on comfort care now  Hx of Stroke/TIA 04/04/2023 admitted for left-sided weakness, right gaze and left facial droop.  CT possible right MCA infarct.  CTA head and neck right M2 occlusion, right ICA siphon occlusion.  MRI showed right MCA large infarct.  EF 70 to 75%.  LDL 52, A1c 5.5, UDS negative. Discharged on ASA and plavix for 3 months than Plavix as well as Lipitor 40. 08/2019: Right PCA infarct including large right occipital and medial temporal lobe and thalamic infarct. CTA showed right PCA occlusion, severe bilateral M2 stenosis. UDS positive for cocaine.  LDL 107, A1c 6.2.  EF 65 to 70%.  Started on aspirin and plavix, then aspirin 325mg , and Lipitor 80.  CIR for 9 days, then transferred to SNF.  Residual left hemianopia. 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 UDS positive for marijuana.  LDL 127, A1c 5.7  Hypertension Home meds:  metoprolol  25mg  bid  Stable now  Hyperlipidemia Home meds:  lipitor 40mg   LDL 52, goal < 70  Vascular dementia  Was on depakote and paxil  Palliative care on board, now in comfort care  Dysphagia Patient has post-stroke dysphagia, SLP consulted On dysphagia 1 and thin liquid Comfort feed now  Other stroke risk factors Advanced age Former smoker Former substance abuse  Other Active Problems Hypernatremia Na 149  One-time fever, Temp 100.8, UA negative, CXR negative Tachycardia heart rate 120s, sinus rhythm  Hospital day # 1  Neurology will sign off. Please call with questions. Thanks for the consult.  Marvel Plan, MD PhD Stroke Neurology 05/20/2023 11:33 AM        To contact Stroke Continuity provider, please refer to [email or web address]. After hours, contact General Neurology

## 2023-05-20 NOTE — TOC Progression Note (Addendum)
Transition of Care [facility]) - Progression Note    Patient Details  Name: [name] MRN: [id number] Date of Birth: Jul 05, 1956  [facility]) CM/SW Contact  Mearl Latin, LCSW Phone Number: 05/20/2023, 11:52 AM  Clinical Narrative:    9am-CSW spoke with [facility] and confirmed patient can return with hospice services if needed. Palliative to meet with family at 11am to determine recommendation.   11:54 AM-Per Palliative, [name] requested [facility] evaluate patient. CSW sent referral to [facility] for review.   4:26 PM-Per [facility], family prefers patient to return to [facility] with Hospice following since patient does not qualify for GIP level of care. CSW notified [facility] but they do not have staff present at this time to accept patient and request patient arrive in the morning. CSW made MD aware.    Expected Discharge Plan: Skilled Nursing Facility Barriers to Discharge: Continued Medical Work up  Expected Discharge Plan and Services In-house Referral: Clinical Social Work     Living arrangements for the past 2 months: Skilled Nursing Facility Expected Discharge Date: 05/20/23                                     Social Determinants of Health (SDOH) Interventions SDOH Screenings   Food Insecurity: Patient Unable To Answer (05/18/2023)  Housing: Patient Unable To Answer (05/18/2023)  Recent Concern: Housing - High Risk (04/04/2023)  Transportation Needs: Patient Unable To Answer (05/18/2023)  Utilities: Patient Unable To Answer (05/18/2023)  Tobacco Use: Medium Risk (05/18/2023)    Readmission Risk Interventions     No data to display

## 2023-05-20 NOTE — Progress Notes (Addendum)
[name] QIO:[id number] DOB: [birth year] DOA: 05/18/2023  PCP: Clovis [name], L.August Saucer, MD  Admit date: 05/18/2023  Discharge date: 05/20/2023  Admitted From: SNF   Disposition:  Res.Hospice or SNF with Hospice   Recommendations for Outpatient Follow-up:   Follow up with PCP in 1-2 weeks  PCP Please obtain BMP/CBC, 2 view CXR in 1week,  (see Discharge instructions)   PCP Please follow up on the following pending results:    Home Health: None   Equipment/Devices: None  Consultations: Neuro, Pal.Care Discharge Condition: Guarded CODE STATUS: DNR Diet Recommendation: Dysphagia 1 diet for comfort, full feeding assistance and precautions.  Very high risk for aspiration.   Chief Complaint  Patient presents with   Code Stroke     Brief history of present illness from the day of admission and additional interim summary    67 y.o. male with medical history significant of posterior dimension, stroke, hypertension, hyperlipidemia presenting after worsening mental status at rehab facility.   History obtained with assistance of chart review.  Patient was admitted in July of this year and found to have significant right MCA infarct.  Discharged with left-sided deficits including left hemiparesis and right-sided gaze preference.  Had a baseline been able to communicate and joke at facility, according to patient's brother patient became increasingly more aphasic and developed dysphagia few weeks ago, started losing more weight, gradually started to decline further and was sent to the hospital for worsening aphasia and dysphagia.  In the hospital imaging of the brain which included MRI suggested evolution of his previous right MCA stroke with some hemorrhagic conversion, he also had evidence of severe dehydration.  He was admitted for  further care.                                                                 Hospital Course   Acute focal neurologic deficit, History of multiple CVAs in the past with recent right MCA CVA 6 weeks ago, now here with gradually progressive aphasia and dysphagia for the last few weeks at SNF.     > Presenting with gradually progressive aphasia and dysphagia over the last few weeks according to the brother, severe dehydration and weight loss ongoing for several weeks as well.  MRI of the brain suggestive of evolution of recent right MCA stroke with some hemorrhagic conversion.  Case discussed with stroke MD in detail, for now single antiplatelet agent aspirin along with statin for secondary prevention.  EEG nonacute.  Recently poor quality of life with not eating or drinking well and losing weight, confirmed with patient's brother and sister.   Pall. care was involved, after detailed discussions between palliative care and family he was transition to full comfort measures with transfer to residential hospice or SNF with Hospice.    Hypertension  -  goal of care comfort.   Hyperlipidemia  -statin stopped, comfort meds only   Vascular dementia  -now comfort medications only   Decreased oral intake, severe dehydration, severe protein calorie malnutrition. now transition to full comfort measures.    Discharge diagnosis     Principal Problem:   Acute focal neurological deficit Active Problems:   History of CVA with residual deficit   HTN (hypertension)   Hyperlipidemia   Vascular dementia [facility])    Discharge instructions    Discharge Instructions     Discharge instructions   Complete by: As directed    Disposition.  Residential hospice Condition.  Guarded CODE STATUS.  DNR Activity.  With assistance as tolerated, full fall precautions. Diet.  Soft with feeding assistance and aspiration precautions. Goal of care.  Comfort.   Discharge wound care:   Complete by: As directed     Heel Left Stage 2 -  Partial thickness loss of dermis presenting as a shallow open injury with a red, pink wound bed without slough.       Discharge Medications   Allergies as of 05/20/2023       Reactions   Shellfish Allergy Itching, Other (See Comments)   Shrimp, especially        Medication List     STOP taking these medications    aspirin EC 81 MG tablet   atorvastatin 40 MG tablet Commonly known as: LIPITOR   clopidogrel 75 MG tablet Commonly known as: PLAVIX   metoprolol tartrate 25 MG tablet Commonly known as: LOPRESSOR   Pro-Stat AWC Liqd   senna-docusate 8.6-50 MG tablet Commonly known as: Senokot-S   Vitamin D (Ergocalciferol) 1.25 MG (50000 UNIT) Caps capsule Commonly known as: DRISDOL       TAKE these medications    divalproex 125 MG capsule Commonly known as: DEPAKOTE SPRINKLE Take 125 mg by mouth 2 (two) times daily.   morphine CONCENTRATE 10 MG/0.5ML Soln concentrated solution Take 0.25 mLs (5 mg total) by mouth every 8 (eight) hours.   PARoxetine 10 MG tablet Commonly known as: PAXIL Take 1 tablet (10 mg total) by mouth at bedtime.               Discharge Care Instructions  (From admission, onward)           Start     Ordered   05/20/23 0000  Discharge wound care:       Comments: Heel Left Stage 2 -  Partial thickness loss of dermis presenting as a shallow open injury with a red, pink wound bed without slough.   05/20/23 1610              Major procedures and Radiology Reports - PLEASE review detailed and final reports thoroughly  -       EEG adult  Result Date: 05/18/2023 Jefferson Fuel, MD     05/18/2023 11:13 PM Routine EEG Report [name] is a 67 y.o. male with a history of altered mental status and right hemispheric stroke who is undergoing an EEG to evaluate for seizures. Report: This EEG was acquired with electrodes placed according to the International 10-20 electrode system (including Fp1, Fp2, F3, F4,  C3, C4, P3, P4, O1, O2, T3, T4, T5, T6, A1, A2, Fz, Cz, Pz). The following electrodes were missing or displaced: none. The occipital dominant rhythm was 6-7 Hz with superimposed focal slowing on the right. This activity is reactive to stimulation. Drowsiness was manifested by background fragmentation; deeper  stages of sleep were identified by K complexes and sleep spindles. There were no interictal epileptiform discharges. There were no electrographic seizures identified. Photic stimulation and hyperventilation were not performed. Impression and clinical correlation: This EEG was obtained while awake and asleep and is abnormal due to mild diffuse slowing indicative of global cerebral dysfunction and superimposed focal slowing over the region of his stroke on the right. Epileptiform abnormalities were not seen during this recording. Bing Neighbors, MD [facility] [phone] If 7pm- 7am, please page neurology on call as listed in AMION.   DG Abd Portable 1V  Result Date: 05/18/2023 CLINICAL DATA:  Altered mental status. Radiograph ordered for MRI clearance. EXAM: PORTABLE ABDOMEN - 1 VIEW COMPARISON:  None Available. FINDINGS: The bowel gas pattern is normal. No radio-opaque calculi or other significant radiographic abnormality are seen. No radiopaque foreign object to prohibit MRI. IMPRESSION: No radiopaque foreign object to prohibit MRI. Electronically Signed   By: Minerva Fester M.D.   On: 05/18/2023 20:19   MR BRAIN WO CONTRAST  Result Date: 05/18/2023 CLINICAL DATA:  Initial evaluation for neuro deficit, stroke suspected. EXAM: MRI HEAD WITHOUT CONTRAST TECHNIQUE: Multiplanar, multiecho pulse sequences of the brain and surrounding structures were obtained without intravenous contrast. COMPARISON:  Comparison made with prior CTs from earlier the same day as well as previous MRI from 04/03/2023. FINDINGS: Brain: Examination mildly degraded by motion artifact. Mildly advanced cerebral atrophy for  age. Patchy and confluent T2/FLAIR hyperintensity involving the supratentorial cerebral white matter, pons, and cerebellum, most consistent with chronic small vessel ischemic disease, fairly advanced in nature. Chronic left ACA distribution infarct involving the left cerebral hemisphere. Additional chronic right PCA infarct involving the right temporal occipital region. Multifocal chronic bilateral cerebellar infarcts. There has been continued interval evolution of previously identified large right MCA distribution infarct. Developing cystic encephalomalacia and laminar necrosis seen throughout the area of infarction. Superimposed multifocal areas of petechial hemorrhage again seen, more pronounced in appearance by MRI as compared to prior CT. No frank intraparenchymal hematoma or malignant hemorrhagic transformation. No other new areas of acute or interval infarction. No other acute intracranial hemorrhage. Few scattered chronic micro hemorrhages noted, small vessel/hypertensive in nature. No mass lesion or midline shift. Ventricular prominence related to underlying atrophy and chronic ischemic changes without hydrocephalus. No extra-axial fluid collection. Pituitary gland suprasellar region within normal limits. Vascular: Major intracranial vascular flow voids are maintained, although the right ICA flow void is mildly heterogeneous, likely related to the large right MCA territory infarct. Skull and upper cervical spine: Cranial junction with normal limits. Bone marrow signal intensity normal. No scalp soft tissue abnormality. Sinuses/Orbits: Globes orbital soft tissues within normal limits. Paranasal sinuses are largely clear. No significant mastoid effusion. Other: None. IMPRESSION: 1. Continued interval evolution of previously identified large right MCA distribution infarct. Superimposed multifocal areas of petechial hemorrhage again seen, more pronounced in appearance by MRI as compared to recent CT. No frank  intraparenchymal hematoma or malignant hemorrhagic transformation. 2. No other new acute intracranial abnormality. 3. Underlying atrophy with advanced chronic microvascular ischemic disease, with multiple additional chronic bilateral cerebral and cerebellar infarcts as above. Electronically Signed   By: Rise Mu M.D.   On: 05/18/2023 19:43   CT HEAD WO CONTRAST ( )  Result Date: 05/18/2023 CLINICAL DATA:  Follow-up examination for stroke. EXAM: CT HEAD WITHOUT CONTRAST TECHNIQUE: Contiguous axial images were obtained from the base of the skull through the vertex without intravenous contrast. RADIATION DOSE REDUCTION: This exam was performed according  to the departmental dose-optimization program which includes automated exposure control, adjustment of the mA and/or kV according to patient size and/or use of iterative reconstruction technique. COMPARISON:  CT from earlier the same day. FINDINGS: Brain: Examination severely degraded by motion artifact, limiting assessment. Large evolving subacute right MCA territory infarct again seen. Superimposed multifocal areas of petechial hemorrhagic transformation again seen, grossly stable from prior CT. No progressive mass effect or midline shift. Multiple additional chronic infarcts involving the bilateral cerebral hemispheres and cerebellum again noted. Underlying chronic microvascular ischemic disease. No visible new hemorrhage or large vessel territory infarct. No mass lesion or midline shift. Stable ventricular size and morphology without hydrocephalus. No visible extra-axial fluid collection. Vascular: No visible hyperdense vessel on this motion degraded exam. Skull: Scalp soft tissues and calvarium demonstrate no new finding. Sinuses/Orbits: Globes and orbital soft tissues grossly within normal limits, although evaluation limited by motion. Paranasal sinuses are largely clear. No significant mastoid effusion. Other: None. IMPRESSION: 1. Severely motion  degraded exam. 2. Large evolving subacute right MCA territory infarct with superimposed multifocal areas of petechial hemorrhage, grossly stable from prior. No progressive mass effect or midline shift. 3. No other new acute intracranial abnormality. 4. Underlying multifocal chronic bilateral cerebral and cerebellar infarcts with severe chronic microvascular ischemic disease. Electronically Signed   By: Rise Mu M.D.   On: 05/18/2023 19:31   DG Chest Portable 1 View  Result Date: 05/18/2023 CLINICAL DATA:  Fever. EXAM: PORTABLE CHEST 1 VIEW COMPARISON:  04/06/2023 FINDINGS: The lungs are clear without focal pneumonia, edema, pneumothorax or pleural effusion. The cardiopericardial silhouette is within normal limits for size. No acute bony abnormality. Telemetry leads overlie the chest. IMPRESSION: No active disease. Electronically Signed   By: Kennith Center M.D.   On: 05/18/2023 11:11   CT HEAD CODE STROKE WO CONTRAST  Result Date: 05/18/2023 CLINICAL DATA:  Code stroke. 67 year old male status post code stroke presentation last month with large right MCA territory infarct at that time. EXAM: CT HEAD WITHOUT CONTRAST TECHNIQUE: Contiguous axial images were obtained from the base of the skull through the vertex without intravenous contrast. RADIATION DOSE REDUCTION: This exam was performed according to the departmental dose-optimization program which includes automated exposure control, adjustment of the mA and/or kV according to patient size and/or use of iterative reconstruction technique. COMPARISON:  Brain MRI 04/03/2023 and earlier. FINDINGS: Brain: Evolving large right MCA territory infarct with petechial hemorrhagic transformation in multiple areas. But there is decreased mass effect on the right lateral ventricle and no midline shift. No intraventricular hemorrhage. No basilar cistern hemorrhage. Underlying chronic severe encephalomalacia in the other major vascular territories of the  bilateral cerebral and cerebellar hemispheres, stable. Vascular: Calcified atherosclerosis at the skull base. No suspicious intracranial vascular hyperdensity. Skull: No acute osseous abnormality identified. Sinuses/Orbits: Visualized paranasal sinuses and mastoids are stable and well aerated. Other: Slight leftward gaze now. Visualized scalp soft tissues are within normal limits. ASPECTS [facility] Stroke Program Early CT Score) Total score (0-10 with 10 being normal): 10 when allowing for previous infarcts, petechial intracranial hemorrhage is present. IMPRESSION: 1. Multifocal petechial hemorrhagic transformation of the large Right MCA infarct last month. No significant intracranial mass effect. No malignant hemorrhage or convincing extra-axial blood. 2. Other bilateral cerebral and cerebellar severe chronic ischemic disease appears stable. 3. These results were communicated to Dr. Derry Lory at 9:53 am on 05/18/2023 by text page via the [facility] messaging system. Electronically Signed   By: Odessa Fleming M.D.   On: 05/18/2023  09:54    Micro Results     Recent Results (from the past 240 hour(s))  Resp panel by RT-PCR (RSV, Flu A&B, Covid) Anterior Nasal Swab     Status: None   Collection Time: 05/18/23 10:47 AM   Specimen: Anterior Nasal Swab  Result Value Ref Range Status   SARS Coronavirus 2 by RT PCR NEGATIVE NEGATIVE Final   Influenza A by PCR NEGATIVE NEGATIVE Final   Influenza B by PCR NEGATIVE NEGATIVE Final    Comment: (NOTE) The Xpert Xpress SARS-CoV-2/FLU/RSV plus assay is intended as an aid in the diagnosis of influenza from Nasopharyngeal swab specimens and should not be used as a sole basis for treatment. Nasal washings and aspirates are unacceptable for Xpert Xpress SARS-CoV-2/FLU/RSV testing.  Fact Sheet for Patients: BloggerCourse.com  Fact Sheet for Healthcare Providers: SeriousBroker.it  This test is not yet approved or cleared by the  [place] FDA and has been authorized for detection and/or diagnosis of SARS-CoV-2 by FDA under an Emergency Use Authorization (EUA). This EUA will remain in effect (meaning this test can be used) for the duration of the COVID-19 declaration under Section 564(b)(1) of the Act, 21 U.S.C. section 360bbb-3(b)(1), unless the authorization is terminated or revoked.     Resp Syncytial Virus by PCR NEGATIVE NEGATIVE Final    Comment: (NOTE) Fact Sheet for Patients: BloggerCourse.com  Fact Sheet for Healthcare Providers: SeriousBroker.it  This test is not yet approved or cleared by the [place] FDA and has been authorized for detection and/or diagnosis of SARS-CoV-2 by FDA under an Emergency Use Authorization (EUA). This EUA will remain in effect (meaning this test can be used) for the duration of the COVID-19 declaration under Section 564(b)(1) of the Act, 21 U.S.C. section 360bbb-3(b)(1), unless the authorization is terminated or revoked.  Performed at [facility], [address], [place], [place] [address]   Respiratory (~20 pathogens) panel by PCR     Status: None   Collection Time: 05/18/23 12:21 PM   Specimen: Nasopharyngeal Swab; Respiratory  Result Value Ref Range Status   Adenovirus NOT DETECTED NOT DETECTED Final   Coronavirus 229E NOT DETECTED NOT DETECTED Final    Comment: (NOTE) The Coronavirus on the Respiratory Panel, DOES NOT test for the novel  Coronavirus (2019 nCoV)    Coronavirus HKU1 NOT DETECTED NOT DETECTED Final   Coronavirus NL63 NOT DETECTED NOT DETECTED Final   Coronavirus OC43 NOT DETECTED NOT DETECTED Final   Metapneumovirus NOT DETECTED NOT DETECTED Final   Rhinovirus / Enterovirus NOT DETECTED NOT DETECTED Final   Influenza A NOT DETECTED NOT DETECTED Final   Influenza B NOT DETECTED NOT DETECTED Final   Parainfluenza Virus 1 NOT DETECTED NOT DETECTED Final   Parainfluenza Virus 2 NOT  DETECTED NOT DETECTED Final   Parainfluenza Virus 3 NOT DETECTED NOT DETECTED Final   Parainfluenza Virus 4 NOT DETECTED NOT DETECTED Final   Respiratory Syncytial Virus NOT DETECTED NOT DETECTED Final   Bordetella pertussis NOT DETECTED NOT DETECTED Final   Bordetella Parapertussis NOT DETECTED NOT DETECTED Final   Chlamydophila pneumoniae NOT DETECTED NOT DETECTED Final   Mycoplasma pneumoniae NOT DETECTED NOT DETECTED Final    Comment: Performed at [facility], [address] [place], [place] [address]    Today   Subjective    [name] today in bed appears to be in no discomfort but remains nonverbal   Objective   Blood pressure (!) 127/99, pulse (!) 102, temperature 98  F (36.7 C), temperature source Oral, resp. rate 20, weight 65.3 kg, SpO2 100%.   Intake/Output Summary (Last 24 hours) at 05/20/2023 1030 Last data filed at 05/20/2023 0553 Gross per 24 hour  Intake 787.74 ml  Output 500 ml  Net 287.74 ml    Exam  Awake, nonverbal, will not follow commands, [place].AT,PERRAL Supple Neck, No JVD,   Symmetrical Chest wall movement, Good air movement bilaterally, CTAB RRR,No Gallops,Rubs or new Murmurs,  +ve B.Sounds, Abd Soft, No tenderness,   No Cyanosis, Clubbing or edema    Data Review   Recent Labs  Lab 05/18/23 0937 05/18/23 0947 05/19/23 0316  WBC 10.1  --  9.8  HGB 14.9 15.6 14.4  HCT 48.9 46.0 47.7  PLT 262  --  234  MCV 86.2  --  87.8  MCH 26.3  --  26.5  MCHC 30.5  --  30.2  RDW 14.4  --  14.3  LYMPHSABS 1.6  --   --   MONOABS 0.9  --   --   EOSABS 0.0  --   --   BASOSABS 0.0  --   --     Recent Labs  Lab 05/18/23 0937 05/18/23 0947 05/19/23 0316  NA 148* 151* 149*  K 4.4 4.6 3.8  CL 111 115* 112*  CO2 23  --  29  ANIONGAP 14  --  8  GLUCOSE 136* 135* 123*  BUN 36* 44* 37*  CREATININE 1.08 1.00 0.95  AST 48*  --  33  ALT 38  --  33  ALKPHOS 109  --  105  BILITOT 1.0  --  1.0  ALBUMIN 2.7*  --  2.5*  INR 1.1  --   --    CALCIUM 8.6*  --  8.9    Total Time in preparing paper work, data evaluation and todays exam - 35 minutes  Signature  -    Susa Raring M.D on 05/20/2023 at 10:30 AM   -  To page go to [email or web address]

## 2023-05-20 NOTE — Progress Notes (Signed)
Patient ID: [name], male   DOB: [birth year], 67 y.o.   MRN: [id number]    Progress Note from the Palliative Medicine Team at [facility]   Patient Name: [name]        Date: 05/20/2023 DOB: 03-19-1956  Age: 67 y.o. MRN#: [id number] Attending Physician: Leroy Sea, MD Primary Care Physician: Asencion Gowda.August Saucer, MD Admit Date: 05/18/2023   Reason for Consultation/Follow-up      HPI/ Brief Hospital Review  67 y.o. male   admitted on 05/18/2023 with past      medical history significant for prior right MCA CVA last month, vascular dementia,  hypertension, hyperlipidemia presenting after worsening mental status at rehab facility/[facility].   MRI of the brain suggestive of evolution of recent right MCA stroke with some hemorrhagic conversion.    Patient does not have medical decision-making capacity.  His legal guardian/Johnny Knutzen along with other family members made decision yesterday to focus on comfort and dignity allowing for natural death, foregoing life-prolonging measures     Subjective  Extensive chart review has been completed prior to meeting with patient/family  including labs, vital signs, imaging, progress/consult notes, orders, medications and available advance directive documents.    This NP assessed patient at the bedside as a follow up for palliative medicine needs and emotional support.  Patient is lying in bed, no acute distress.  His eyes are open, he has right gaze preference.  Nonverbal unable to follow commands  I met with his legal guardian/Johnny Leanos today as scheduled for ongoing conversation regarding current medical situation.  [name] shares patient's continued physical, functional and cognitive decline over the past many years.  After his wife died in 2022/12/02 patient was placed in skilled nursing facility for long-term care.  He has had slow continued physical and functional decline.    Ongoing education regarding current medical  situation.  Education offered on the continued evolution of a previously identified large right MCA infarct.  His EEG was significant for diffuse slowing indicative of global cerebral dysfunction.  He remains with left-sided weakness.  In light of honoring Mr. Gozman is comfort and dignity family is comfortable with a full comfort path moving forward.  Plan of care -DNR DNI -No artificial feeding now or in the future -No further diagnostics -No further IV antibiotics -Symptom management       -Roxanol 5 mg p.o./sublingual scheduled every 8 hours for underlying pain and discomfort -Family is hopeful for residential hospice for end-of-life care.  [facility] referral.    Prognosis is likely less than a few weeks  MOST form completed to reflect comfort  Education offered on the natural trajectory and expectations at end-of-life.   Education offered today regarding  the importance of continued conversation with family and their  medical providers regarding overall plan of care and treatment options,  ensuring decisions are within the context of the patients values and GOCs.  Questions and concerns addressed   Discussed with primary team and nursing staff   Time:  65 minutes  Detailed review of medical records ( labs, imaging, vital signs), medically appropriate exam ( MS, skin, cardia,  resp)   discussed with treatment team, counseling and education to patient, family, staff, documenting clinical information, medication management, coordination of care    Lorinda Creed NP  Palliative Medicine Team Team Phone # [phone] Pager [phone]

## 2023-05-21 NOTE — Progress Notes (Signed)
Report called to [facility] RN at [facility]. Patient is a dnr/comfort care.

## 2023-05-21 NOTE — Progress Notes (Signed)
OT Cancellation Note  Patient Details Name: [name] MRN: [id number] DOB: [birth year]   Cancelled Treatment:    Reason Eval/Treat Not Completed: Other (comment) (Pt family decided to go comfort care, OT signing off. Please reconsult OT if needed.)  05/21/2023  AB, OTR/L  Acute Rehabilitation Services  Office: [phone]   [name] 05/21/2023, 11:09 AM

## 2023-05-21 NOTE — Discharge Summary (Signed)
Physician Discharge Summary  [name] PIR:[id number] DOB: 04/18/1956 DOA: 05/18/2023  PCP: [name], [name]  Admit date: 05/18/2023 Discharge date: 05/21/2023  Admitted From: SNF   Disposition:  Res.Hospice or SNF with Hospice     Recommendations for Outpatient Follow-up:    Follow up with PCP in 1-2 weeks   PCP Please obtain BMP/CBC, 2 view CXR in 1week,  (see Discharge instructions)    PCP Please follow up on the following pending results:      Home Health: None   Equipment/Devices: None  Consultations: Neuro, Pal.Care Discharge Condition: Guarded CODE STATUS: DNR Diet Recommendation: Dysphagia 1 diet for comfort, full feeding assistance and precautions.  Very high risk for aspiration.    Brief/Interim Summary:  67 y.o. male with medical history significant of posterior dimension, stroke, hypertension, hyperlipidemia presenting after worsening mental status at rehab facility.   History obtained with assistance of chart review.  Patient was admitted in July of this year and found to have significant right MCA infarct.  Discharged with left-sided deficits including left hemiparesis and right-sided gaze preference.  Had a baseline been able to communicate and joke at facility, according to patient's brother patient became increasingly more aphasic and developed dysphagia few weeks ago, started losing more weight, gradually started to decline further and was sent to the hospital for worsening aphasia and dysphagia.  In the hospital imaging of the brain which included MRI suggested evolution of his previous right MCA stroke with some hemorrhagic conversion, he also had evidence of severe dehydration.  He was admitted for further care.  Workup significant for Magick transformation of right MCA for last month, patient was transition to comfort measures, and plan to discharge to [name] with hospice.  -Acute focal neurologic deficit, History of multiple CVAs in the past with recent right MCA  CVA 6 weeks ago, now here with gradually progressive aphasia and dysphagia for the last few weeks at SNF.   -Hemorrhagic transformation of Right MCA from last month    > Presenting with gradually progressive aphasia and dysphagia over the last few weeks according to the brother, severe dehydration and weight loss ongoing for several weeks as well.  MRI of the brain suggestive of evolution of recent right MCA stroke with some hemorrhagic conversion.  Case discussed with stroke [name] in detail, for now single antiplatelet agent aspirin along with statin for secondary prevention.  EEG nonacute.  Recently poor quality of life with not eating or drinking well and losing weight, confirmed with patient's brother and sister.    Pall. care was involved, after detailed discussions between palliative care and family he was transition to full comfort measures with transfer to residential hospice or SNF with Hospice.     Hypertension  - goal of care comfort.   Hyperlipidemia  -statin stopped, comfort meds only   Vascular dementia  -now comfort medications only   Decreased [name] intake, severe dehydration, severe protein calorie malnutrition. now transition to full comfort measures.  Discharge Diagnoses:  Principal Problem:   Acute focal neurological deficit Active Problems:   History of CVA with residual deficit   HTN (hypertension)   Hyperlipidemia   Vascular dementia [name])    Discharge Instructions  Discharge Instructions     Discharge instructions   Complete by: As directed    Disposition.  Residential hospice Condition.  Guarded CODE STATUS.  DNR Activity.  With assistance as tolerated, full fall precautions. Diet.  Soft with feeding assistance and aspiration precautions. Goal of care.  Comfort.   Discharge wound care:   Complete by: As directed    Heel Left Stage 2 -  Partial thickness loss of dermis presenting as a shallow open injury with a red, pink wound bed without slough.       Allergies as of 05/21/2023       Reactions   Shellfish Allergy Itching, Other (See Comments)   Shrimp, especially        Medication List     STOP taking these medications    aspirin EC 81 MG tablet   atorvastatin 40 MG tablet Commonly known as: LIPITOR   clopidogrel 75 MG tablet Commonly known as: PLAVIX   metoprolol tartrate 25 MG tablet Commonly known as: LOPRESSOR   Pro-Stat AWC Liqd   senna-docusate 8.6-50 MG tablet Commonly known as: Senokot-S   Vitamin D (Ergocalciferol) 1.25 MG (50000 UNIT) Caps capsule Commonly known as: DRISDOL       TAKE these medications    divalproex 125 MG capsule Commonly known as: DEPAKOTE SPRINKLE Take 125 mg by mouth 2 (two) times daily.   morphine CONCENTRATE 10 MG/0.5ML Soln concentrated solution Take 0.25 mLs (5 mg total) by mouth every 8 (eight) hours.   PARoxetine 10 MG tablet Commonly known as: PAXIL Take 1 tablet (10 mg total) by mouth at bedtime.               Discharge Care Instructions  (From admission, onward)           Start     Ordered   05/20/23 0000  Discharge wound care:       Comments: Heel Left Stage 2 -  Partial thickness loss of dermis presenting as a shallow open injury with a red, pink wound bed without slough.   05/20/23 2595            Contact information for after-discharge care     Destination     [facility], [facility] Preferred SNF .   Service: Skilled Nursing Contact information: [address] [place] [address] [phone]                    Allergies  Allergen Reactions   Shellfish Allergy Itching and Other (See Comments)    Shrimp, especially    Consultations: Neurology Palliative   Procedures/Studies: EEG adult  Result Date: May 21, 2023 [name] Fuel, [name]     May 21, 2023 11:13 PM Routine EEG Report [name] is a 67 y.o. male with a history of altered mental status and right hemispheric stroke who is  undergoing an EEG to evaluate for seizures. Report: This EEG was acquired with electrodes placed according to the International 10-20 electrode system (including Fp1, Fp2, F3, F4, C3, C4, P3, P4, O1, O2, T3, T4, T5, T6, A1, A2, Fz, Cz, Pz). The following electrodes were missing or displaced: none. The occipital dominant rhythm was 6-7 Hz with superimposed focal slowing on the right. This activity is reactive to stimulation. Drowsiness was manifested by background fragmentation; deeper stages of sleep were identified by K complexes and sleep spindles. There were no interictal epileptiform discharges. There were no electrographic seizures identified. Photic stimulation and hyperventilation were not performed. Impression and clinical correlation: This EEG was obtained while awake and asleep and is abnormal due to mild diffuse slowing indicative of global cerebral dysfunction and superimposed focal slowing over the region of his stroke on the right. Epileptiform abnormalities were not seen during this recording. Bing Neighbors, [name] [facility]  [phone] If 7pm- 7am, please page neurology on call as listed in AMION.   DG Abd Portable 1V  Result Date: 05/18/2023 CLINICAL DATA:  Altered mental status. Radiograph ordered for MRI clearance. EXAM: PORTABLE ABDOMEN - 1 VIEW COMPARISON:  None Available. FINDINGS: The bowel gas pattern is normal. No radio-opaque calculi or other significant radiographic abnormality are seen. No radiopaque foreign object to prohibit MRI. IMPRESSION: No radiopaque foreign object to prohibit MRI. Electronically Signed   By: Minerva Fester M.D.   On: 05/18/2023 20:19   MR BRAIN WO CONTRAST  Result Date: 05/18/2023 CLINICAL DATA:  Initial evaluation for neuro deficit, stroke suspected. EXAM: MRI HEAD WITHOUT CONTRAST TECHNIQUE: Multiplanar, multiecho pulse sequences of the brain and surrounding structures were obtained without intravenous contrast. COMPARISON:  Comparison made  with prior CTs from earlier the same day as well as previous MRI from 04/03/2023. FINDINGS: Brain: Examination mildly degraded by motion artifact. Mildly advanced cerebral atrophy for age. Patchy and confluent T2/FLAIR hyperintensity involving the supratentorial cerebral white matter, pons, and cerebellum, most consistent with chronic small vessel ischemic disease, fairly advanced in nature. Chronic left ACA distribution infarct involving the left cerebral hemisphere. Additional chronic right PCA infarct involving the right temporal occipital region. Multifocal chronic bilateral cerebellar infarcts. There has been continued interval evolution of previously identified large right MCA distribution infarct. Developing cystic encephalomalacia and laminar necrosis seen throughout the area of infarction. Superimposed multifocal areas of petechial hemorrhage again seen, more pronounced in appearance by MRI as compared to prior CT. No frank intraparenchymal hematoma or malignant hemorrhagic transformation. No other new areas of acute or interval infarction. No other acute intracranial hemorrhage. Few scattered chronic micro hemorrhages noted, small vessel/hypertensive in nature. No mass lesion or midline shift. Ventricular prominence related to underlying atrophy and chronic ischemic changes without hydrocephalus. No extra-axial fluid collection. Pituitary gland suprasellar region within normal limits. Vascular: Major intracranial vascular flow voids are maintained, although the right ICA flow void is mildly heterogeneous, likely related to the large right MCA territory infarct. Skull and upper cervical spine: Cranial junction with normal limits. Bone marrow signal intensity normal. No scalp soft tissue abnormality. Sinuses/Orbits: Globes orbital soft tissues within normal limits. Paranasal sinuses are largely clear. No significant mastoid effusion. Other: None. IMPRESSION: 1. Continued interval evolution of previously  identified large right MCA distribution infarct. Superimposed multifocal areas of petechial hemorrhage again seen, more pronounced in appearance by MRI as compared to recent CT. No frank intraparenchymal hematoma or malignant hemorrhagic transformation. 2. No other new acute intracranial abnormality. 3. Underlying atrophy with advanced chronic microvascular ischemic disease, with multiple additional chronic bilateral cerebral and cerebellar infarcts as above. Electronically Signed   By: Rise Mu M.D.   On: 05/18/2023 19:43   CT HEAD WO CONTRAST ( )  Result Date: 05/18/2023 CLINICAL DATA:  Follow-up examination for stroke. EXAM: CT HEAD WITHOUT CONTRAST TECHNIQUE: Contiguous axial images were obtained from the base of the skull through the vertex without intravenous contrast. RADIATION DOSE REDUCTION: This exam was performed according to the departmental dose-optimization program which includes automated exposure control, adjustment of the mA and/or kV according to patient size and/or use of iterative reconstruction technique. COMPARISON:  CT from earlier the same day. FINDINGS: Brain: Examination severely degraded by motion artifact, limiting assessment. Large evolving subacute right MCA territory infarct again seen. Superimposed multifocal areas of petechial hemorrhagic transformation again seen, grossly stable from prior CT. No progressive mass effect or midline shift. Multiple additional chronic infarcts involving the bilateral  cerebral hemispheres and cerebellum again noted. Underlying chronic microvascular ischemic disease. No visible new hemorrhage or large vessel territory infarct. No mass lesion or midline shift. Stable ventricular size and morphology without hydrocephalus. No visible extra-axial fluid collection. Vascular: No visible hyperdense vessel on this motion degraded exam. Skull: Scalp soft tissues and calvarium demonstrate no new finding. Sinuses/Orbits: Globes and orbital soft  tissues grossly within normal limits, although evaluation limited by motion. Paranasal sinuses are largely clear. No significant mastoid effusion. Other: None. IMPRESSION: 1. Severely motion degraded exam. 2. Large evolving subacute right MCA territory infarct with superimposed multifocal areas of petechial hemorrhage, grossly stable from prior. No progressive mass effect or midline shift. 3. No other new acute intracranial abnormality. 4. Underlying multifocal chronic bilateral cerebral and cerebellar infarcts with severe chronic microvascular ischemic disease. Electronically Signed   By: Rise Mu M.D.   On: 05/18/2023 19:31   DG Chest Portable 1 View  Result Date: 05/18/2023 CLINICAL DATA:  Fever. EXAM: PORTABLE CHEST 1 VIEW COMPARISON:  04/06/2023 FINDINGS: The lungs are clear without focal pneumonia, edema, pneumothorax or pleural effusion. The cardiopericardial silhouette is within normal limits for size. No acute bony abnormality. Telemetry leads overlie the chest. IMPRESSION: No active disease. Electronically Signed   By: Kennith Center M.D.   On: 05/18/2023 11:11   CT HEAD CODE STROKE WO CONTRAST  Result Date: 05/18/2023 CLINICAL DATA:  Code stroke. 67 year old male status post code stroke presentation last month with large right MCA territory infarct at that time. EXAM: CT HEAD WITHOUT CONTRAST TECHNIQUE: Contiguous axial images were obtained from the base of the skull through the vertex without intravenous contrast. RADIATION DOSE REDUCTION: This exam was performed according to the departmental dose-optimization program which includes automated exposure control, adjustment of the mA and/or kV according to patient size and/or use of iterative reconstruction technique. COMPARISON:  Brain MRI 04/03/2023 and earlier. FINDINGS: Brain: Evolving large right MCA territory infarct with petechial hemorrhagic transformation in multiple areas. But there is decreased mass effect on the right lateral  ventricle and no midline shift. No intraventricular hemorrhage. No basilar cistern hemorrhage. Underlying chronic severe encephalomalacia in the other major vascular territories of the bilateral cerebral and cerebellar hemispheres, stable. Vascular: Calcified atherosclerosis at the skull base. No suspicious intracranial vascular hyperdensity. Skull: No acute osseous abnormality identified. Sinuses/Orbits: Visualized paranasal sinuses and mastoids are stable and well aerated. Other: Slight leftward gaze now. Visualized scalp soft tissues are within normal limits. ASPECTS [facility] Stroke Program Early CT Score) Total score (0-10 with 10 being normal): 10 when allowing for previous infarcts, petechial intracranial hemorrhage is present. IMPRESSION: 1. Multifocal petechial hemorrhagic transformation of the large Right MCA infarct last month. No significant intracranial mass effect. No malignant hemorrhage or convincing extra-axial blood. 2. Other bilateral cerebral and cerebellar severe chronic ischemic disease appears stable. 3. These results were communicated to Dr. Derry Lory at 9:53 am on 05/18/2023 by text page via the [facility] messaging system. Electronically Signed   By: Odessa Fleming M.D.   On: 05/18/2023 09:54      Subjective: Significant events overnight, patient is unable to provide any complaints  Discharge Exam: Vitals:   05/21/23 0000 05/21/23 0835  BP:  (!) 154/142  Pulse:  (!) 134  Resp:  20  Temp: 99.2 F (37.3 C) 99.1 F (37.3 C)  SpO2:  91%   Vitals:   05/20/23 2032 05/20/23 2035 05/21/23 0000 05/21/23 0835  BP: 115/87 115/87  (!) 154/142  Pulse:    Marland Kitchen)  134  Resp:    20  Temp: (!) 100.9 F (38.3 C)  99.2 F (37.3 C) 99.1 F (37.3 C)  TempSrc: Axillary  Axillary Axillary  SpO2:    91%  Weight:        Eyes open, nonverbal, would not follow commands, cachectic, chronically ill-appearing Fair entry bilaterally, no wheezing Regular rate and rhythm  abdomen soft, bowel sounds  present Extremity with no edema or cyanosis     The results of significant diagnostics from this hospitalization (including imaging, microbiology, ancillary and laboratory) are listed below for reference.     Microbiology: Recent Results (from the past 240 hour(s))  Resp panel by RT-PCR (RSV, Flu A&B, Covid) Anterior Nasal Swab     Status: None   Collection Time: 05/18/23 10:47 AM   Specimen: Anterior Nasal Swab  Result Value Ref Range Status   SARS Coronavirus 2 by RT PCR NEGATIVE NEGATIVE Final   Influenza A by PCR NEGATIVE NEGATIVE Final   Influenza B by PCR NEGATIVE NEGATIVE Final    Comment: (NOTE) The Xpert Xpress SARS-CoV-2/FLU/RSV plus assay is intended as an aid in the diagnosis of influenza from Nasopharyngeal swab specimens and should not be used as a sole basis for treatment. Nasal washings and aspirates are unacceptable for Xpert Xpress SARS-CoV-2/FLU/RSV testing.  Fact Sheet for Patients: BloggerCourse.com  Fact Sheet for Healthcare Providers: SeriousBroker.it  This test is not yet approved or cleared by the [place] FDA and has been authorized for detection and/or diagnosis of SARS-CoV-2 by FDA under an Emergency Use Authorization (EUA). This EUA will remain in effect (meaning this test can be used) for the duration of the COVID-19 declaration under Section 564(b)(1) of the Act, 21 U.S.C. section 360bbb-3(b)(1), unless the authorization is terminated or revoked.     Resp Syncytial Virus by PCR NEGATIVE NEGATIVE Final    Comment: (NOTE) Fact Sheet for Patients: BloggerCourse.com  Fact Sheet for Healthcare Providers: SeriousBroker.it  This test is not yet approved or cleared by the [place] FDA and has been authorized for detection and/or diagnosis of SARS-CoV-2 by FDA under an Emergency Use Authorization (EUA). This EUA will remain in effect  (meaning this test can be used) for the duration of the COVID-19 declaration under Section 564(b)(1) of the Act, 21 U.S.C. section 360bbb-3(b)(1), unless the authorization is terminated or revoked.  Performed at [facility], [address], [place], [place] [address]   Respiratory (~20 pathogens) panel by PCR     Status: None   Collection Time: 05/18/23 12:21 PM   Specimen: Nasopharyngeal Swab; Respiratory  Result Value Ref Range Status   Adenovirus NOT DETECTED NOT DETECTED Final   Coronavirus 229E NOT DETECTED NOT DETECTED Final    Comment: (NOTE) The Coronavirus on the Respiratory Panel, DOES NOT test for the novel  Coronavirus (2019 nCoV)    Coronavirus HKU1 NOT DETECTED NOT DETECTED Final   Coronavirus NL63 NOT DETECTED NOT DETECTED Final   Coronavirus OC43 NOT DETECTED NOT DETECTED Final   Metapneumovirus NOT DETECTED NOT DETECTED Final   Rhinovirus / Enterovirus NOT DETECTED NOT DETECTED Final   Influenza A NOT DETECTED NOT DETECTED Final   Influenza B NOT DETECTED NOT DETECTED Final   Parainfluenza Virus 1 NOT DETECTED NOT DETECTED Final   Parainfluenza Virus 2 NOT DETECTED NOT DETECTED Final   Parainfluenza Virus 3 NOT DETECTED NOT DETECTED Final   Parainfluenza Virus 4 NOT DETECTED NOT DETECTED Final   Respiratory Syncytial Virus NOT DETECTED NOT  DETECTED Final   Bordetella pertussis NOT DETECTED NOT DETECTED Final   Bordetella Parapertussis NOT DETECTED NOT DETECTED Final   Chlamydophila pneumoniae NOT DETECTED NOT DETECTED Final   Mycoplasma pneumoniae NOT DETECTED NOT DETECTED Final    Comment: Performed at [facility], [address] [place], [place] [address]     Labs: BNP (last 3 results) No results for input(s): "BNP" in the last 8760 hours. Basic Metabolic Panel: Recent Labs  Lab 05/18/23 0937 05/18/23 0947 05/19/23 0316  NA 148* 151* 149*  K 4.4 4.6 3.8  CL 111 115* 112*  CO2 23  --  29  GLUCOSE 136* 135* 123*  BUN 36* 44* 37*   CREATININE 1.08 1.00 0.95  CALCIUM 8.6*  --  8.9   Liver Function Tests: Recent Labs  Lab 05/18/23 0937 05/19/23 0316  AST 48* 33  ALT 38 33  ALKPHOS 109 105  BILITOT 1.0 1.0  PROT 7.2 6.8  ALBUMIN 2.7* 2.5*   No results for input(s): "LIPASE", "AMYLASE" in the last 168 hours. No results for input(s): "AMMONIA" in the last 168 hours. CBC: Recent Labs  Lab 05/18/23 0937 05/18/23 0947 05/19/23 0316  WBC 10.1  --  9.8  NEUTROABS 7.6  --   --   HGB 14.9 15.6 14.4  HCT 48.9 46.0 47.7  MCV 86.2  --  87.8  PLT 262  --  234   Cardiac Enzymes: No results for input(s): "CKTOTAL", "CKMB", "CKMBINDEX", "TROPONINI" in the last 168 hours. BNP: Invalid input(s): "POCBNP" CBG: Recent Labs  Lab 05/18/23 0937  GLUCAP 132*   D-Dimer No results for input(s): "DDIMER" in the last 72 hours. Hgb A1c No results for input(s): "HGBA1C" in the last 72 hours. Lipid Profile No results for input(s): "CHOL", "HDL", "LDLCALC", "TRIG", "CHOLHDL", "LDLDIRECT" in the last 72 hours. Thyroid function studies No results for input(s): "TSH", "T4TOTAL", "T3FREE", "THYROIDAB" in the last 72 hours.  Invalid input(s): "FREET3" Anemia work up No results for input(s): "VITAMINB12", "FOLATE", "FERRITIN", "TIBC", "IRON", "RETICCTPCT" in the last 72 hours. Urinalysis    Component Value Date/Time   COLORURINE YELLOW 05/19/2023 0551   APPEARANCEUR CLEAR 05/19/2023 0551   LABSPEC 1.033 (H) 05/19/2023 0551   PHURINE 5.0 05/19/2023 0551   GLUCOSEU NEGATIVE 05/19/2023 0551   HGBUR NEGATIVE 05/19/2023 0551   BILIRUBINUR NEGATIVE 05/19/2023 0551   KETONESUR NEGATIVE 05/19/2023 0551   PROTEINUR NEGATIVE 05/19/2023 0551   UROBILINOGEN 0.2 02/04/2013 1315   NITRITE NEGATIVE 05/19/2023 0551   LEUKOCYTESUR NEGATIVE 05/19/2023 0551   Sepsis Labs Recent Labs  Lab 05/18/23 0937 05/19/23 0316  WBC 10.1 9.8   Microbiology Recent Results (from the past 240 hour(s))  Resp panel by RT-PCR (RSV, Flu A&B,  Covid) Anterior Nasal Swab     Status: None   Collection Time: 05/18/23 10:47 AM   Specimen: Anterior Nasal Swab  Result Value Ref Range Status   SARS Coronavirus 2 by RT PCR NEGATIVE NEGATIVE Final   Influenza A by PCR NEGATIVE NEGATIVE Final   Influenza B by PCR NEGATIVE NEGATIVE Final    Comment: (NOTE) The Xpert Xpress SARS-CoV-2/FLU/RSV plus assay is intended as an aid in the diagnosis of influenza from Nasopharyngeal swab specimens and should not be used as a sole basis for treatment. Nasal washings and aspirates are unacceptable for Xpert Xpress SARS-CoV-2/FLU/RSV testing.  Fact Sheet for Patients: BloggerCourse.com  Fact Sheet for Healthcare Providers: SeriousBroker.it  This test is not yet approved or cleared by the [place]  FDA and has been authorized for detection and/or diagnosis of SARS-CoV-2 by FDA under an Emergency Use Authorization (EUA). This EUA will remain in effect (meaning this test can be used) for the duration of the COVID-19 declaration under Section 564(b)(1) of the Act, 21 U.S.C. section 360bbb-3(b)(1), unless the authorization is terminated or revoked.     Resp Syncytial Virus by PCR NEGATIVE NEGATIVE Final    Comment: (NOTE) Fact Sheet for Patients: BloggerCourse.com  Fact Sheet for Healthcare Providers: SeriousBroker.it  This test is not yet approved or cleared by the [place] FDA and has been authorized for detection and/or diagnosis of SARS-CoV-2 by FDA under an Emergency Use Authorization (EUA). This EUA will remain in effect (meaning this test can be used) for the duration of the COVID-19 declaration under Section 564(b)(1) of the Act, 21 U.S.C. section 360bbb-3(b)(1), unless the authorization is terminated or revoked.  Performed at [facility], [address] [place], [place] [address]   Respiratory (~20 pathogens) panel  by PCR     Status: None   Collection Time: 05/18/23 12:21 PM   Specimen: Nasopharyngeal Swab; Respiratory  Result Value Ref Range Status   Adenovirus NOT DETECTED NOT DETECTED Final   Coronavirus 229E NOT DETECTED NOT DETECTED Final    Comment: (NOTE) The Coronavirus on the Respiratory Panel, DOES NOT test for the novel  Coronavirus (2019 nCoV)    Coronavirus HKU1 NOT DETECTED NOT DETECTED Final   Coronavirus NL63 NOT DETECTED NOT DETECTED Final   Coronavirus OC43 NOT DETECTED NOT DETECTED Final   Metapneumovirus NOT DETECTED NOT DETECTED Final   Rhinovirus / Enterovirus NOT DETECTED NOT DETECTED Final   Influenza A NOT DETECTED NOT DETECTED Final   Influenza B NOT DETECTED NOT DETECTED Final   Parainfluenza Virus 1 NOT DETECTED NOT DETECTED Final   Parainfluenza Virus 2 NOT DETECTED NOT DETECTED Final   Parainfluenza Virus 3 NOT DETECTED NOT DETECTED Final   Parainfluenza Virus 4 NOT DETECTED NOT DETECTED Final   Respiratory Syncytial Virus NOT DETECTED NOT DETECTED Final   Bordetella pertussis NOT DETECTED NOT DETECTED Final   Bordetella Parapertussis NOT DETECTED NOT DETECTED Final   Chlamydophila pneumoniae NOT DETECTED NOT DETECTED Final   Mycoplasma pneumoniae NOT DETECTED NOT DETECTED Final    Comment: Performed at [name], [address] [place], [place] [address]     Time coordinating discharge: Over 30 minutes  SIGNED:   Huey Bienenstock, [name]  [facility] 05/21/2023, 11:04 AM Pager   If 7PM-7AM, please contact night-coverage [email or web address]

## 2023-05-21 NOTE — Progress Notes (Signed)
PTAR here for patient to transfer back to [facility].

## 2023-05-21 NOTE — TOC Transition Note (Signed)
Transition of Care [facility]) - CM/SW Discharge Note   Patient Details  Name: [name] MRN: [id number] Date of Birth: [birth year]  Transition of Care [facility]) CM/SW Contact:  Mearl Latin, LCSW Phone Number: 05/21/2023, 10:19 AM   Clinical Narrative:    Patient will DC to: [facility] Anticipated DC date: 05/21/23 Family notified: Left vm for brother Transport by: Sharin Mons   Per MD patient ready for DC to [facility]. RN to call report prior to discharge [phone] room 505b). RN, patient, patient's family, Hospice, and facility notified of DC. Discharge Summary and FL2 sent to facility. DC packet on chart including signed script and DNR. Ambulance transport requested for patient.   CSW will sign off for now as social work intervention is no longer needed. Please consult [place] again if new needs arise.     Final next level of care: Skilled Nursing Facility Barriers to Discharge: Barriers Resolved   Patient Goals and CMS Choice CMS Medicare.gov Compare Post Acute Care list provided to:: Patient Represenative (must comment) Choice offered to / list presented to : Sibling  Discharge Placement     Existing PASRR number confirmed : 05/21/23          Patient chooses bed at: [facility] Patient to be transferred to facility by: PTAR Name of family member notified: Brother Patient and family notified of of transfer: 05/21/23  Discharge Plan and Services Additional resources added to the After Visit Summary for   In-house Referral: Clinical Social Work                                   Social Determinants of Health (SDOH) Interventions SDOH Screenings   Food Insecurity: Patient Unable To Answer (05/18/2023)  Housing: Patient Unable To Answer (05/18/2023)  Recent Concern: Housing - High Risk (04/04/2023)  Transportation Needs: Patient Unable To Answer (05/18/2023)  Utilities: Patient Unable To Answer (05/18/2023)  Tobacco Use: Medium Risk (05/18/2023)     Readmission Risk  Interventions     No data to display

## 2023-05-21 NOTE — Plan of Care (Signed)

## 2023-05-22 ENCOUNTER — Other Ambulatory Visit: Payer: Self-pay | Admitting: *Deleted

## 2023-05-22 DIAGNOSIS — L97409 Non-pressure chronic ulcer of unspecified heel and midfoot with unspecified severity: Secondary | ICD-10-CM

## 2023-05-22 DIAGNOSIS — I69320 Aphasia following cerebral infarction: Secondary | ICD-10-CM | POA: Diagnosis not present

## 2023-05-22 DIAGNOSIS — R0600 Dyspnea, unspecified: Secondary | ICD-10-CM | POA: Diagnosis not present

## 2023-05-22 DIAGNOSIS — L89620 Pressure ulcer of left heel, unstageable: Secondary | ICD-10-CM | POA: Diagnosis not present

## 2023-05-22 DIAGNOSIS — R1310 Dysphagia, unspecified: Secondary | ICD-10-CM | POA: Diagnosis not present

## 2023-05-22 DIAGNOSIS — Z515 Encounter for palliative care: Secondary | ICD-10-CM | POA: Diagnosis not present

## 2023-05-22 DIAGNOSIS — E43 Unspecified severe protein-calorie malnutrition: Secondary | ICD-10-CM | POA: Diagnosis not present

## 2023-05-22 DIAGNOSIS — I69391 Dysphagia following cerebral infarction: Secondary | ICD-10-CM | POA: Diagnosis not present

## 2023-05-29 ENCOUNTER — Encounter (HOSPITAL_COMMUNITY): Payer: Medicare Other

## 2023-06-03 ENCOUNTER — Encounter: Payer: Medicare Other | Admitting: Vascular Surgery

## 2023-06-17 DEATH — deceased
